# Patient Record
Sex: Male | Born: 1950
Health system: Southern US, Community
[De-identification: ages and names within clinical notes are randomized; demographics above are authoritative.]

## PROBLEM LIST (undated history)

## (undated) DIAGNOSIS — E291 Testicular hypofunction: Secondary | ICD-10-CM

## (undated) DIAGNOSIS — Z862 Personal history of diseases of the blood and blood-forming organs and certain disorders involving the immune mechanism: Secondary | ICD-10-CM

## (undated) DIAGNOSIS — N189 Chronic kidney disease, unspecified: Secondary | ICD-10-CM

## (undated) DIAGNOSIS — M5090 Cervical disc disorder, unspecified, unspecified cervical region: Secondary | ICD-10-CM

## (undated) DIAGNOSIS — Z87442 Personal history of urinary calculi: Secondary | ICD-10-CM

## (undated) DIAGNOSIS — K5730 Diverticulosis of large intestine without perforation or abscess without bleeding: Secondary | ICD-10-CM

## (undated) DIAGNOSIS — C801 Malignant (primary) neoplasm, unspecified: Secondary | ICD-10-CM

## (undated) DIAGNOSIS — G4733 Obstructive sleep apnea (adult) (pediatric): Secondary | ICD-10-CM

## (undated) DIAGNOSIS — E785 Hyperlipidemia, unspecified: Secondary | ICD-10-CM

## (undated) DIAGNOSIS — E119 Type 2 diabetes mellitus without complications: Secondary | ICD-10-CM

## (undated) DIAGNOSIS — K648 Other hemorrhoids: Secondary | ICD-10-CM

## (undated) DIAGNOSIS — G709 Myoneural disorder, unspecified: Secondary | ICD-10-CM

## (undated) DIAGNOSIS — E6609 Other obesity due to excess calories: Secondary | ICD-10-CM

## (undated) DIAGNOSIS — N529 Male erectile dysfunction, unspecified: Secondary | ICD-10-CM

## (undated) DIAGNOSIS — I10 Essential (primary) hypertension: Secondary | ICD-10-CM

## (undated) DIAGNOSIS — M7552 Bursitis of left shoulder: Secondary | ICD-10-CM

## (undated) DIAGNOSIS — M1990 Unspecified osteoarthritis, unspecified site: Secondary | ICD-10-CM

## (undated) HISTORY — DX: Diverticulosis of large intestine without perforation or abscess without bleeding: K57.30

## (undated) HISTORY — PX: COLONOSCOPY: SHX174

## (undated) HISTORY — DX: Male erectile dysfunction, unspecified: N52.9

## (undated) HISTORY — DX: Type 2 diabetes mellitus without complications: E11.9

## (undated) HISTORY — DX: Personal history of diseases of the blood and blood-forming organs and certain disorders involving the immune mechanism: Z86.2

## (undated) HISTORY — DX: Other hemorrhoids: K64.8

## (undated) HISTORY — DX: Hyperlipidemia, unspecified: E78.5

## (undated) HISTORY — DX: Cervical disc disorder, unspecified, unspecified cervical region: M50.90

## (undated) HISTORY — DX: Testicular hypofunction: E29.1

## (undated) HISTORY — DX: Other obesity due to excess calories: E66.09

## (undated) HISTORY — DX: Essential (primary) hypertension: I10

## (undated) HISTORY — DX: Obstructive sleep apnea (adult) (pediatric): G47.33

## (undated) HISTORY — DX: Bursitis of left shoulder: M75.52

---

## 1994-02-21 ENCOUNTER — Encounter: Payer: Self-pay | Admitting: Pulmonary Disease

## 1994-03-12 ENCOUNTER — Encounter: Payer: Self-pay | Admitting: Pulmonary Disease

## 1998-03-20 ENCOUNTER — Encounter: Admission: RE | Admit: 1998-03-20 | Discharge: 1998-06-18 | Payer: Self-pay | Admitting: *Deleted

## 1998-07-22 ENCOUNTER — Encounter: Admission: RE | Admit: 1998-07-22 | Discharge: 1998-10-20 | Payer: Self-pay | Admitting: *Deleted

## 2000-04-05 ENCOUNTER — Encounter: Payer: Self-pay | Admitting: Pulmonary Disease

## 2002-05-14 ENCOUNTER — Ambulatory Visit: Admission: RE | Admit: 2002-05-14 | Discharge: 2002-05-14 | Payer: Self-pay | Admitting: *Deleted

## 2004-04-22 ENCOUNTER — Ambulatory Visit: Payer: Self-pay | Admitting: Pulmonary Disease

## 2004-10-21 ENCOUNTER — Ambulatory Visit: Payer: Self-pay | Admitting: Family Medicine

## 2004-11-02 ENCOUNTER — Ambulatory Visit: Payer: Self-pay | Admitting: Family Medicine

## 2004-11-17 ENCOUNTER — Ambulatory Visit: Payer: Self-pay | Admitting: Family Medicine

## 2004-11-23 ENCOUNTER — Ambulatory Visit: Payer: Self-pay

## 2006-01-31 ENCOUNTER — Ambulatory Visit: Payer: Self-pay | Admitting: Family Medicine

## 2006-02-15 ENCOUNTER — Ambulatory Visit: Payer: Self-pay | Admitting: Family Medicine

## 2006-03-03 ENCOUNTER — Ambulatory Visit: Payer: Self-pay | Admitting: Family Medicine

## 2006-05-10 HISTORY — PX: ANTERIOR CERVICAL DECOMP/DISCECTOMY FUSION: SHX1161

## 2006-06-29 ENCOUNTER — Ambulatory Visit: Payer: Self-pay | Admitting: Family Medicine

## 2006-06-29 LAB — CONVERTED CEMR LAB
Hgb A1c MFr Bld: 8.4 % — ABNORMAL HIGH (ref 4.6–6.0)
Microalb Creat Ratio: 48.4 mg/g — ABNORMAL HIGH (ref 0.0–30.0)

## 2006-10-05 ENCOUNTER — Ambulatory Visit: Payer: Self-pay | Admitting: Family Medicine

## 2006-10-05 LAB — CONVERTED CEMR LAB
Creatinine,U: 168.3 mg/dL
Hgb A1c MFr Bld: 8.3 % — ABNORMAL HIGH (ref 4.6–6.0)
Microalb Creat Ratio: 92.7 mg/g — ABNORMAL HIGH (ref 0.0–30.0)

## 2006-10-07 ENCOUNTER — Encounter: Payer: Self-pay | Admitting: Family Medicine

## 2006-10-07 DIAGNOSIS — I10 Essential (primary) hypertension: Secondary | ICD-10-CM | POA: Insufficient documentation

## 2006-10-07 DIAGNOSIS — E785 Hyperlipidemia, unspecified: Secondary | ICD-10-CM | POA: Insufficient documentation

## 2006-10-19 ENCOUNTER — Ambulatory Visit: Payer: Self-pay | Admitting: Family Medicine

## 2006-12-20 ENCOUNTER — Encounter: Payer: Self-pay | Admitting: Family Medicine

## 2007-03-22 ENCOUNTER — Ambulatory Visit: Payer: Self-pay | Admitting: Family Medicine

## 2007-03-22 LAB — CONVERTED CEMR LAB
Bilirubin Urine: NEGATIVE
Glucose, Urine, Semiquant: 100
Specific Gravity, Urine: 1.02
WBC Urine, dipstick: NEGATIVE

## 2007-03-28 ENCOUNTER — Emergency Department (HOSPITAL_COMMUNITY): Admission: EM | Admit: 2007-03-28 | Discharge: 2007-03-28 | Payer: Self-pay | Admitting: Emergency Medicine

## 2007-03-29 ENCOUNTER — Telehealth: Payer: Self-pay | Admitting: Family Medicine

## 2007-03-29 LAB — CONVERTED CEMR LAB
ALT: 18 units/L (ref 0–53)
BUN: 30 mg/dL — ABNORMAL HIGH (ref 6–23)
Basophils Absolute: 0 10*3/uL (ref 0.0–0.1)
Basophils Relative: 0.5 % (ref 0.0–1.0)
CO2: 33 meq/L — ABNORMAL HIGH (ref 19–32)
Cholesterol: 161 mg/dL (ref 0–200)
GFR calc non Af Amer: 82 mL/min
HDL: 46 mg/dL (ref >39.0)
Hemoglobin: 13 g/dL (ref 13.0–17.0)
LDL Cholesterol: 101 mg/dL — ABNORMAL HIGH (ref 0–99)
Monocytes Absolute: 0.5 10*3/uL (ref 0.2–0.7)
Monocytes Relative: 6.8 % (ref 3.0–11.0)
Neutro Abs: 4.8 10*3/uL (ref 1.4–7.7)
RBC: 4.74 M/uL (ref 4.22–5.81)
Sodium: 148 meq/L — ABNORMAL HIGH (ref 135–145)
Testosterone: 269.02 ng/dL — ABNORMAL LOW (ref 350.00–890)
Total Protein: 6.9 g/dL (ref 6.0–8.3)
VLDL: 14 mg/dL (ref 0–40)
WBC: 7.1 10*3/uL (ref 4.5–10.5)

## 2007-03-30 ENCOUNTER — Emergency Department (HOSPITAL_COMMUNITY): Admission: EM | Admit: 2007-03-30 | Discharge: 2007-03-30 | Payer: Self-pay | Admitting: Emergency Medicine

## 2007-04-02 ENCOUNTER — Encounter: Admission: RE | Admit: 2007-04-02 | Discharge: 2007-04-02 | Payer: Self-pay | Admitting: Family Medicine

## 2007-04-03 ENCOUNTER — Telehealth: Payer: Self-pay | Admitting: Internal Medicine

## 2007-04-04 ENCOUNTER — Encounter: Payer: Self-pay | Admitting: Family Medicine

## 2007-04-13 ENCOUNTER — Encounter: Payer: Self-pay | Admitting: Family Medicine

## 2007-04-24 ENCOUNTER — Ambulatory Visit (HOSPITAL_COMMUNITY): Admission: RE | Admit: 2007-04-24 | Discharge: 2007-04-25 | Payer: Self-pay | Admitting: Neurosurgery

## 2007-05-11 HISTORY — PX: POSTERIOR LAMINECTOMY / DECOMPRESSION CERVICAL SPINE: SUR739

## 2007-05-19 ENCOUNTER — Encounter: Payer: Self-pay | Admitting: Family Medicine

## 2007-05-31 ENCOUNTER — Telehealth: Payer: Self-pay | Admitting: Pulmonary Disease

## 2007-06-02 ENCOUNTER — Encounter: Payer: Self-pay | Admitting: Pulmonary Disease

## 2007-06-06 ENCOUNTER — Telehealth: Payer: Self-pay | Admitting: Family Medicine

## 2007-06-09 ENCOUNTER — Encounter: Payer: Self-pay | Admitting: Family Medicine

## 2007-06-10 ENCOUNTER — Encounter: Payer: Self-pay | Admitting: Pulmonary Disease

## 2007-06-13 ENCOUNTER — Ambulatory Visit: Payer: Self-pay | Admitting: Pulmonary Disease

## 2007-06-14 ENCOUNTER — Encounter: Payer: Self-pay | Admitting: Family Medicine

## 2007-06-15 ENCOUNTER — Encounter: Admission: RE | Admit: 2007-06-15 | Discharge: 2007-06-15 | Payer: Self-pay | Admitting: Neurosurgery

## 2007-06-20 ENCOUNTER — Encounter: Payer: Self-pay | Admitting: Family Medicine

## 2007-07-10 ENCOUNTER — Encounter: Payer: Self-pay | Admitting: Family Medicine

## 2007-07-11 ENCOUNTER — Ambulatory Visit: Payer: Self-pay | Admitting: Family Medicine

## 2007-07-17 DIAGNOSIS — M5030 Other cervical disc degeneration, unspecified cervical region: Secondary | ICD-10-CM | POA: Insufficient documentation

## 2007-07-17 DIAGNOSIS — M67919 Unspecified disorder of synovium and tendon, unspecified shoulder: Secondary | ICD-10-CM | POA: Insufficient documentation

## 2007-07-17 DIAGNOSIS — M719 Bursopathy, unspecified: Secondary | ICD-10-CM

## 2007-07-18 ENCOUNTER — Encounter: Payer: Self-pay | Admitting: Pulmonary Disease

## 2007-07-19 ENCOUNTER — Telehealth: Payer: Self-pay | Admitting: Family Medicine

## 2007-09-13 ENCOUNTER — Encounter: Payer: Self-pay | Admitting: Family Medicine

## 2007-10-25 ENCOUNTER — Encounter: Payer: Self-pay | Admitting: Pulmonary Disease

## 2007-10-30 ENCOUNTER — Telehealth: Payer: Self-pay | Admitting: Pulmonary Disease

## 2007-12-11 ENCOUNTER — Encounter: Payer: Self-pay | Admitting: Family Medicine

## 2007-12-14 ENCOUNTER — Ambulatory Visit: Payer: Self-pay | Admitting: Family Medicine

## 2007-12-14 DIAGNOSIS — E119 Type 2 diabetes mellitus without complications: Secondary | ICD-10-CM | POA: Insufficient documentation

## 2007-12-15 LAB — CONVERTED CEMR LAB: Hgb A1c MFr Bld: 9.7 % — ABNORMAL HIGH (ref 4.6–6.0)

## 2008-02-08 HISTORY — PX: OTHER SURGICAL HISTORY: SHX169

## 2008-02-13 ENCOUNTER — Encounter: Payer: Self-pay | Admitting: Family Medicine

## 2008-02-18 ENCOUNTER — Encounter: Admission: RE | Admit: 2008-02-18 | Discharge: 2008-02-18 | Payer: Self-pay | Admitting: Neurosurgery

## 2008-02-21 ENCOUNTER — Encounter: Payer: Self-pay | Admitting: Family Medicine

## 2008-03-07 ENCOUNTER — Ambulatory Visit (HOSPITAL_COMMUNITY): Admission: RE | Admit: 2008-03-07 | Discharge: 2008-03-09 | Payer: Self-pay | Admitting: Neurosurgery

## 2008-03-27 ENCOUNTER — Encounter: Payer: Self-pay | Admitting: Family Medicine

## 2008-04-09 ENCOUNTER — Ambulatory Visit: Payer: Self-pay | Admitting: Family Medicine

## 2008-04-09 LAB — CONVERTED CEMR LAB
Bilirubin Urine: NEGATIVE
Nitrite: NEGATIVE
Specific Gravity, Urine: 1.02
Urobilinogen, UA: 1
pH: 7

## 2008-04-11 ENCOUNTER — Ambulatory Visit: Payer: Self-pay | Admitting: Family Medicine

## 2008-04-16 LAB — CONVERTED CEMR LAB
ALT: 19 units/L (ref 0–53)
Basophils Absolute: 0.1 10*3/uL (ref 0.0–0.1)
Basophils Relative: 0.8 % (ref 0.0–3.0)
Creatinine,U: 159.8 mg/dL
Eosinophils Relative: 5 % (ref 0.0–5.0)
HCT: 32.6 % — ABNORMAL LOW (ref 39.0–52.0)
HDL: 38.9 mg/dL — ABNORMAL LOW (ref >39.0)
Hgb A1c MFr Bld: 8.2 % — ABNORMAL HIGH (ref 4.6–6.0)
LDL Cholesterol: 64 mg/dL (ref 0–99)
Lymphocytes Relative: 26.9 % (ref 12.0–46.0)
MCV: 76.7 fL — ABNORMAL LOW (ref 78.0–100.0)
Monocytes Absolute: 0.5 10*3/uL (ref 0.1–1.0)
Monocytes Relative: 7.1 % (ref 3.0–12.0)
Platelets: 379 10*3/uL (ref 150–400)
RDW: 15 % — ABNORMAL HIGH (ref 11.5–14.6)
Sodium: 140 meq/L (ref 135–145)
TSH: 2.77 microintl units/mL (ref 0.35–5.50)
Total CHOL/HDL Ratio: 3.2
Total Protein: 7.6 g/dL (ref 6.0–8.3)
Triglycerides: 108 mg/dL (ref 0–149)
VLDL: 22 mg/dL (ref 0–40)

## 2008-04-26 ENCOUNTER — Encounter: Payer: Self-pay | Admitting: Family Medicine

## 2008-06-18 ENCOUNTER — Encounter: Payer: Self-pay | Admitting: Family Medicine

## 2008-07-08 ENCOUNTER — Telehealth: Payer: Self-pay | Admitting: Family Medicine

## 2008-08-16 ENCOUNTER — Encounter: Payer: Self-pay | Admitting: Family Medicine

## 2008-09-18 ENCOUNTER — Encounter: Payer: Self-pay | Admitting: Family Medicine

## 2008-11-27 ENCOUNTER — Ambulatory Visit: Payer: Self-pay | Admitting: Family Medicine

## 2008-11-27 DIAGNOSIS — N529 Male erectile dysfunction, unspecified: Secondary | ICD-10-CM | POA: Insufficient documentation

## 2008-11-27 DIAGNOSIS — E291 Testicular hypofunction: Secondary | ICD-10-CM

## 2008-11-27 LAB — CONVERTED CEMR LAB: Hemoglobin: 8.4 g/dL

## 2008-11-28 ENCOUNTER — Ambulatory Visit: Payer: Self-pay | Admitting: Family Medicine

## 2008-11-28 LAB — CONVERTED CEMR LAB
TSH: 1.45 microintl units/mL (ref 0.35–5.50)
Testosterone: 188.5 ng/dL — ABNORMAL LOW (ref 350.00–890.00)

## 2008-12-25 ENCOUNTER — Encounter: Payer: Self-pay | Admitting: Family Medicine

## 2009-03-13 ENCOUNTER — Telehealth: Payer: Self-pay | Admitting: Family Medicine

## 2009-04-08 ENCOUNTER — Ambulatory Visit: Payer: Self-pay | Admitting: Family Medicine

## 2009-04-16 ENCOUNTER — Ambulatory Visit: Payer: Self-pay | Admitting: Family Medicine

## 2009-04-16 LAB — CONVERTED CEMR LAB
Alkaline Phosphatase: 66 units/L (ref 39–117)
Basophils Absolute: 0.1 10*3/uL (ref 0.0–0.1)
Chloride: 105 meq/L (ref 96–112)
Creatinine, Ser: 0.8 mg/dL (ref 0.4–1.5)
Eosinophils Relative: 4.9 % (ref 0.0–5.0)
Glucose, Bld: 162 mg/dL — ABNORMAL HIGH (ref 70–99)
Ketones, ur: NEGATIVE mg/dL
Lymphocytes Relative: 27.6 % (ref 12.0–46.0)
Neutro Abs: 3.3 10*3/uL (ref 1.4–7.7)
Neutrophils Relative %: 59.8 % (ref 43.0–77.0)
Nitrite: NEGATIVE
Platelets: 327 10*3/uL (ref 150.0–400.0)
RBC: 4.64 M/uL (ref 4.22–5.81)
RDW: 16 % — ABNORMAL HIGH (ref 11.5–14.6)
Specific Gravity, Urine: 1.02 (ref 1.000–1.030)
TSH: 2.71 microintl units/mL (ref 0.35–5.50)
Total CHOL/HDL Ratio: 3
Triglycerides: 76 mg/dL (ref 0.0–149.0)
Urine Glucose: 100 mg/dL
Urobilinogen, UA: 0.2 (ref 0.0–1.0)
VLDL: 15.2 mg/dL (ref 0.0–40.0)

## 2009-04-17 LAB — CONVERTED CEMR LAB
Hgb A1c MFr Bld: 8.5 % — ABNORMAL HIGH (ref 4.6–6.5)
Testosterone: 230.84 ng/dL — ABNORMAL LOW (ref 350.00–890.00)

## 2009-04-28 ENCOUNTER — Telehealth: Payer: Self-pay | Admitting: Family Medicine

## 2009-05-07 ENCOUNTER — Telehealth: Payer: Self-pay | Admitting: Family Medicine

## 2009-05-13 ENCOUNTER — Telehealth: Payer: Self-pay | Admitting: Family Medicine

## 2009-05-20 ENCOUNTER — Telehealth: Payer: Self-pay | Admitting: Family Medicine

## 2009-05-28 ENCOUNTER — Telehealth: Payer: Self-pay | Admitting: Family Medicine

## 2009-06-04 ENCOUNTER — Telehealth: Payer: Self-pay | Admitting: Internal Medicine

## 2009-06-16 ENCOUNTER — Telehealth: Payer: Self-pay | Admitting: Family Medicine

## 2009-06-19 ENCOUNTER — Telehealth: Payer: Self-pay | Admitting: Family Medicine

## 2009-09-29 ENCOUNTER — Telehealth: Payer: Self-pay | Admitting: *Deleted

## 2010-01-22 ENCOUNTER — Ambulatory Visit: Payer: Self-pay | Admitting: Pulmonary Disease

## 2010-01-22 DIAGNOSIS — G4733 Obstructive sleep apnea (adult) (pediatric): Secondary | ICD-10-CM | POA: Insufficient documentation

## 2010-01-28 ENCOUNTER — Telehealth (INDEPENDENT_AMBULATORY_CARE_PROVIDER_SITE_OTHER): Payer: Self-pay | Admitting: *Deleted

## 2010-02-03 ENCOUNTER — Ambulatory Visit: Payer: Self-pay | Admitting: Pulmonary Disease

## 2010-02-15 DIAGNOSIS — D649 Anemia, unspecified: Secondary | ICD-10-CM | POA: Insufficient documentation

## 2010-02-15 DIAGNOSIS — K5730 Diverticulosis of large intestine without perforation or abscess without bleeding: Secondary | ICD-10-CM | POA: Insufficient documentation

## 2010-03-02 ENCOUNTER — Encounter: Payer: Self-pay | Admitting: Pulmonary Disease

## 2010-03-04 ENCOUNTER — Encounter: Payer: Self-pay | Admitting: Pulmonary Disease

## 2010-03-07 ENCOUNTER — Encounter: Payer: Self-pay | Admitting: Pulmonary Disease

## 2010-04-06 ENCOUNTER — Ambulatory Visit: Payer: Self-pay | Admitting: Pulmonary Disease

## 2010-04-09 ENCOUNTER — Telehealth: Payer: Self-pay | Admitting: Family Medicine

## 2010-04-15 ENCOUNTER — Ambulatory Visit: Payer: Self-pay | Admitting: Pulmonary Disease

## 2010-04-15 ENCOUNTER — Encounter: Payer: Self-pay | Admitting: Pulmonary Disease

## 2010-04-16 LAB — CONVERTED CEMR LAB
ALT: 17 units/L (ref 0–53)
Basophils Relative: 0.5 % (ref 0.0–3.0)
Bilirubin, Direct: 0.1 mg/dL (ref 0.0–0.3)
Cholesterol: 148 mg/dL (ref 0–200)
Eosinophils Absolute: 0.3 10*3/uL (ref 0.0–0.7)
HCT: 37.8 % — ABNORMAL LOW (ref 39.0–52.0)
Hemoglobin, Urine: NEGATIVE
Hgb A1c MFr Bld: 6.8 % — ABNORMAL HIGH (ref 4.6–6.5)
LDL Cholesterol: 83 mg/dL (ref 0–99)
Leukocytes, UA: NEGATIVE
MCV: 80.3 fL (ref 78.0–100.0)
Monocytes Relative: 6.7 % (ref 3.0–12.0)
Neutro Abs: 3.8 10*3/uL (ref 1.4–7.7)
PSA: 0.56 ng/mL (ref 0.10–4.00)
Platelets: 309 10*3/uL (ref 150.0–400.0)
Potassium: 4 meq/L (ref 3.5–5.1)
Sodium: 141 meq/L (ref 135–145)
Specific Gravity, Urine: >=1.03 (ref 1.000–1.030)
Testosterone: 326.7 ng/dL — ABNORMAL LOW (ref 350.00–890.00)
Total CHOL/HDL Ratio: 3
Total Protein, Urine: 100 mg/dL
Total Protein: 6.9 g/dL (ref 6.0–8.3)
Triglycerides: 91 mg/dL (ref 0.0–149.0)
VLDL: 18.2 mg/dL (ref 0.0–40.0)
WBC: 5.8 10*3/uL (ref 4.5–10.5)

## 2010-05-31 ENCOUNTER — Encounter: Payer: Self-pay | Admitting: Neurosurgery

## 2010-05-31 ENCOUNTER — Encounter: Payer: Self-pay | Admitting: Family Medicine

## 2010-06-11 NOTE — Assessment & Plan Note (Signed)
Summary: rov for osa   Visit Type:  Follow-up  CC:  follow up OSA. last seen 06/2007. 7/7 nights x6-8 hours a night. Pt states his maching is giving him some problems and is making noises. Pt states he think his pressures need to be rechecked bc he is having a little more problems than before.  History of Present Illness: the pt comes in today for f/u of his osa.  He is wearing cpap compliantly, but is having problems with his machine.  It is making noises, and the pt feels that it is not putting out adequate pressure?  He has noticed that he is not sleeping quite as well, and has seen deterioration in his perceived daytime alertness with periods of inactivity.  His weight is up only 3 pounds since the last visit, but I had asked him to work aggressively on weight loss.  Current Medications (verified): 1)  Adult Aspirin Low Strength 81 Mg Chew (Aspirin) .... Take 1 Tablet By Mouth Once A Day 2)  Glimepiride 4 Mg Tabs (Glimepiride) .... Take 1 Tablet By Mouth Twice A Day 3)  Simvastatin 40 Mg Tabs (Simvastatin) .... Take 1 Tablet By Mouth At Bedtime 4)  Lisinopril 40 Mg  Tabs (Lisinopril) .... Once Daily 5)  Furosemide 40 Mg  Tabs (Furosemide) .... Once Daily 6)  Amlodipine Besylate 10 Mg  Tabs (Amlodipine Besylate) .... Once Daily 7)  Lantus 100 Unit/ml  Soln (Insulin Glargine) .... As Directed 70 Units Once Daily 8)  Bd Insulin Syringe 29g X 1/2" 0.5 Ml Misc (Insulin Syringe-Needle U-100) .... Use As Directed 9)  Accu-Chek Comfort Curve  Soln (Blood Glucose Calibration) 10)  Freestyle Lancets  Misc (Lancets) .... To Test Daily 11)  Freestyle Lite Test  Strp (Glucose Blood) .... To Test Daily 12)  Testim 1 % Gel (Testosterone) .... Apply Daily 13)  Viagra 100 Mg Tabs (Sildenafil Citrate) .Marland Kitchen.. 1 As Instructed 14)  Actoplus Met Xr 15-1000 Mg Xr24h-Tab (Pioglitazone Hcl-Metformin Hcl) .Marland Kitchen.. 1 Bid 15)  Androgel Pump 1 % Gel (Testosterone) .... 2 Pumps Per Day, 5.0 Gms Qd 16)  Fexofenadine Hcl 180  Mg Tabs (Fexofenadine Hcl) .Marland Kitchen.. 1 Once Daily For Allergy 17)  Relion Pen Needles 31g X 8 Mm Misc (Insulin Pen Needle) .... Use As Directed Daily  Allergies (verified): 1)  ! Penicillin 2)  Amoxicillin (Amoxicillin) 3)  Levaquin (Levofloxacin)  Past History:  Past medical, surgical, family and social histories (including risk factors) reviewed, and no changes noted (except as noted below).  Past Medical History: Reviewed history from 04/11/2008 and no changes required. PHYSICAL EXAMINATION (ICD-V70.0) KIDNEY DISEASE, CHRONIC, STAGE II (ICD-585.2) HYPERTENSION (ICD-401.9) HYPERLIPIDEMIA (ICD-272.4) DIABETES MELLITUS, TYPE I (ICD-250.01) Sleep Apnea      EKG  hospital 2009 back surgery  PNEUMONIA VACCINE  2004   DT 9adacel 0 2009 Colon  pt states has had but will ck on date  SMOKER  never   Past Surgical History: Reviewed history from 04/16/2009 and no changes required. cervical laminectomy  Family History: Reviewed history from 06/13/2007 and no changes required. Father-heart disease Mother-breast CA  Social History: Reviewed history from 06/13/2007 and no changes required. Patient never smoked.  ETOH use-wine Pt is married with children. 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  Review of Systems       The patient complains of shortness of breath with activity, non-productive cough, indigestion, nasal congestion/difficulty breathing through nose, and hand/feet swelling.  The patient denies shortness of breath at  rest, productive cough, coughing up blood, chest pain, irregular heartbeats, acid heartburn, loss of appetite, weight change, abdominal pain, difficulty swallowing, sore throat, tooth/dental problems, headaches, sneezing, itching, ear ache, anxiety, depression, joint stiffness or pain, rash, change in color of mucus, and fever.    Vital Signs:  Patient profile:   60 year old male Height:      68 inches Weight:      288.25 pounds BMI:      43.99 O2 Sat:      97 % on Room air Temp:     98.5 degrees F oral Pulse rate:   78 / minute BP sitting:   138 / 72  (left arm) Cuff size:   large  Vitals Entered By: Carver Fila (January 22, 2010 10:17 AM)  O2 Flow:  Room air CC: follow up OSA. last seen 06/2007. 7/7 nights x6-8 hours a night. Pt states his maching is giving him some problems and is making noises. Pt states he think his pressures need to be rechecked bc he is having a little more problems than before Comments meds and allergies updated Phone number updated Carver Fila  January 22, 2010 10:19 AM    Physical Exam  General:  obese male in nad Nose:  no skin breakdown or pressure necrosis from cpap mask Extremities:  mild edema, no cyanosis  Neurologic:  alert, does not appear sleepy, moves all 4.   Impression & Recommendations:  Problem # 1:  OBSTRUCTIVE SLEEP APNEA (ICD-327.23) the pt is having issues with his machine that appears to be affecting his sleep.  Will work on getting him a new machine, and will also use auto mode to re-optimize his pressure.  I have asked the pt to work hard on losing weight.  Will let him know his optimal pressure, and he is to call me if he continues to have sleep issues.  He will followup formally with me in one year, or sooner if having problems.  Other Orders: DME Referral (DME) Est. Patient Level III ([address])  Patient Instructions: 1)  will get you a new machine and re-optimize your pressure.  will let you know the results. 2)  work on weight loss 3)  followup with me in one year.   Immunization History:  Influenza Immunization History:    Influenza:  historical (02/07/2009)   Appended Document: rov for osa we have received pt's download off machine and put into your very important look at folder

## 2010-06-11 NOTE — Letter (Signed)
Summary: Dr Marlaine Hind  optometrist  Dr Marlaine Hind  optometrist   Imported By: Lester Chisholm 04/13/2010 09:11:19  _____________________________________________________________________  External Attachment:    Type:   Image     Comment:   External Document

## 2010-06-11 NOTE — Progress Notes (Signed)
Summary: refill glimepiride   Phone Note From Pharmacy   [facility]* Reason for Call: Needs renewal Summary of Call: refill glimepiride 4 mg  Initial call taken by: Pura Spice, RN,  May 28, 2009 3:45 PM  Follow-up for Phone Call        ok per dr Scotty Court with 6 refills  Follow-up by: Pura Spice, RN,  May 28, 2009 3:45 PM    Prescriptions: GLIMEPIRIDE 4 MG TABS (GLIMEPIRIDE) Take 1 tablet by mouth twice a day  #180 x 1   Entered by:   Pura Spice, RN   Authorized by:   Judithann Sheen MD   Signed by:   Pura Spice, RN on 05/28/2009   Method used:   Electronically to        [facility]* (retail)       [address] [place], [place]  [address]       Ph: [phone]       Fax: [phone]   RxID:   [id number]

## 2010-06-11 NOTE — Progress Notes (Signed)
  Phone Note From Pharmacy   Caller: betsy w/ [facility] Call For: clance  Summary of Call: needs copy of sleep study since they have never set up a cpap for pt before. fax # [phone]. contact # betsy [phone] Initial call taken by: [name], CNA,  January 28, 2010 4:05 PM  Follow-up for Phone Call        Spoke with rep at [facility] that a baseline sleep study not on file for pt-will need this to complete order. Called Sleep lab to see if study is available to send to them. Waiting for Sleep Study.[name] CMA  January 28, 2010 4:19 PM    No sleep study on file at sleep center per [name]-will pull paper chart to see if in there.[name] CMA  January 28, 2010 4:21 PM   Additional Follow-up for Phone Call Additional follow up Details #1::        received chart from medical records and found old sleep study from 1995.  faxed sleep study to [facility] with [facility].  [name] LPN  January 29, 2010 9:40 AM

## 2010-06-11 NOTE — Progress Notes (Signed)
Summary: written prescriptions  Phone Note Call from Patient   Caller: Patient Call For: Judithann Sheen MD Summary of Call: Pt needs a written prescription for a 90 day supply of Lantus and needles.  (Pins) Pt will pick up prescriptions.  Pharmacy has denied........it due to quanitiy. [phone] Initial call taken by: Lynann Beaver CMA,  June 16, 2009 12:34 PM  Follow-up for Phone Call        see Rx, ready for pick up.  Patient notified.  Follow-up by: Gladis Riffle, RN,  June 17, 2009 8:50 AM    New/Updated Medications: RELION PEN NEEDLES 31G X 8 MM MISC (INSULIN PEN NEEDLE) use as directed daily Prescriptions: LANTUS 100 UNIT/ML  SOLN (INSULIN GLARGINE) as directed 70 units once daily  #90 day x 3   Entered by:   Gladis Riffle, RN   Authorized by:   Judithann Sheen MD   Signed by:   Gladis Riffle, RN on 06/17/2009   Method used:   Print then Give to Patient   RxID:   [id number] RELION PEN NEEDLES 31G X 8 MM MISC (INSULIN PEN NEEDLE) use as directed daily  #90 day x 3   Entered by:   Gladis Riffle, RN   Authorized by:   Judithann Sheen MD   Signed by:   Gladis Riffle, RN on 06/17/2009   Method used:   Print then Give to Patient   RxID:   [id number] LANTUS 100 UNIT/ML  SOLN (INSULIN GLARGINE) as directed 70 units once daily  #90 day x 3   Entered by:   Gladis Riffle, RN   Authorized by:   Judithann Sheen MD   Signed by:   Gladis Riffle, RN on 06/17/2009   Method used:   Electronically to        [facility]* (retail)       [address] [place], [place]  [address]       Ph: [phone]       Fax: [phone]   RxID:   [id number]  redone for pt to pick up as required.  Called pharmacy to disregard electronic Rx.  Appended Document: written prescriptions Pharmacy called back--there was no problem with original Rx; pt will have to pick up every two weeks due to insurance limits.  They will  re-explain to pt.

## 2010-06-11 NOTE — Progress Notes (Signed)
Summary: refill on glimepiride  Phone Note From Pharmacy   [facility]er: [facility]* Reason for Call: Needs renewal Details for Reason: GLIMEPIRIDE 4MG  Initial call taken by: Romualdo Bolk, CMA Duncan Dull),  Sep 29, 2009 3:55 PM  Follow-up for Phone Call        Rx sent to pharmacy. Follow-up by: Romualdo Bolk, CMA Duncan Dull),  Sep 29, 2009 3:58 PM    Prescriptions: GLIMEPIRIDE 4 MG TABS (GLIMEPIRIDE) Take 1 tablet by mouth twice a day  #180 x 1   Entered by:   Romualdo Bolk, CMA (AAMA)   Authorized by:   Judithann Sheen MD   Signed by:   Romualdo Bolk, CMA (AAMA) on 09/29/2009   Method used:   Electronically to        [facility]* (retail)       [address] [place], [place]  [address]       Ph: [phone]       Fax: [phone]   RxID:   [id number]

## 2010-06-11 NOTE — Miscellaneous (Signed)
Summary: optimal pressure 12cm   Clinical Lists Changes  Orders: Added new Referral order of DME Referral (DME) - Signed auto download shows great compliance, no significant leak, optimal pressure of 12cm

## 2010-06-11 NOTE — Progress Notes (Signed)
Summary: victoza insurance needs prior auth.   Phone Note Call from Patient   Caller: Patient Summary of Call: stated needs victoza  Initial call taken by: Pura Spice, RN,  May 20, 2009 11:15 AM  Follow-up for Phone Call        victoza insulin clled in last week. called [facility] and spoke to [place]  and Renae Fickle stated victoza not covered by insurance and his insurace prefers byetta.   Requested for Renae Fickle to send over prior  auth  Follow-up by: Pura Spice, RN,  May 20, 2009 11:17 AM  Additional Follow-up for Phone Call Additional follow up Details #1::        called left mess for pt that this is where we stand. Additional Follow-up by: Pura Spice, RN,  May 20, 2009 11:18 AM

## 2010-06-11 NOTE — Progress Notes (Signed)
Summary: order for UA  Phone Note Call from Patient   Caller: Patient Call For: Judithann Sheen MD Summary of Call: Pt's son is concerned about his father's mental states.....Marland Kitchenseems depressed and no interest in anything.  His wife passed away in 2023/03/30, but they would like to check his UA in case it is an UTI.  Cannot bring him for OV this week. [phone]   Elmer Picker needs an order for an URINE  [phone] Griffin Dakin is the nurse Initial call taken by: Lynann Beaver CMA,  June 04, 2009 4:08 PM  Follow-up for Phone Call        OK to check u/a  Order faxed. Lynann Beaver [facility]  June 05, 2009 8:12 AM  Follow-up by: Gordy Savers  MD,  June 05, 2009 8:02 AM

## 2010-06-11 NOTE — Assessment & Plan Note (Signed)
Summary: est as new pt/la   CC:  New patient to establish... hx OSA, HBP, DM, and Obesity....  History of Present Illness: 60 y/o WM to establish for management of mult medical problems including OSA, HBP, DM w/ neuropathy, Obesity, & other problems listed below...    Current Problems:   OBSTRUCTIVE SLEEP APNEA (ICD-327.23) - followed by DrClance & wears CPAP compliantly...   ~  9/11:  f/u OV w/ DrClance and he ordered new CPAP machine for [name] [name]...  HYPERTENSION (ICD-401.9) - on AMLODIPINE 10mg /d,  LISINOPRIL 40mg /d, & FUROSEMIDE 40mg /d... BP= 140/62 & similar at home... denies HA, visual changes, CP, palipit, dizziness, syncope, dyspnea, edema, etc... he knows to elim sodium from diet & lose weight to control BP better...  HYPERLIPIDEMIA (ICD-272.4) - on SIMVASTATIN 40mg Qhs...  ~  FLP 12/09 showed TChol 124, TG 108, HDL 39, LDL 64  ~  FLP 11/10 showed TChol 145, TG 76, HDL 43, LDL 86  DIABETES MELLITUS, TYPE II (ICD-250.00) - on LANTUS 70u daily,  ACTOS+MET 1000-15 Bid, & GLIMEPIRIDE 4mg Bid...   ~  his Urine microalb has been +pos, w/ norm BUN & Creat...  ~  labs 12/09 showed BS= 86, A1c= 8.2  ~  labs 11/10 showed BS= 162, A1c= 8.5  EXOGENOUS OBESITY (ICD-278.00) - his weight has been consistantly in the 270-290# range x yrs... we discussed low carb/ low fat diet & exercise program needed to result in substantial weight loss...  ~  weight 12/09 = 279#,  68" tall,  BMI= 42.5  ~  weight 11/10 = 276#, 68" tall,  BMI= 42  ~  weight 9/11 = 290#,  68" tall,  BMI= 44... we discussed Bariatric surgery.  DIVERTICULOSIS OF COLON (ICD-562.10) - CT scan of abd 1/04 showed diverticulosis... he is overdue for referral to GI for colonoscopy...  TESTICULAR HYPOFUNCTION (ICD-257.2) - he has prev been on Androgel & Testim, but currently not on any either medication due to insurance coverage problems...  ~  labs 7/10 showed Testosterone level = 189 (350-890)  ~  labs 12/10 showed Testosterone  level = 231  ERECTILE DYSFUNCTION, ORGANIC (ICD-607.84) - uses VIAGRA 100mg  Prn...  DISC DISEASE, CERVICAL (ICD-722.4) - he is s/p 2 separate cervical operations from DrHirsh in 2008 & 2009  BURSITIS, LEFT SHOULDER (ICD-726.10) - he uses OTC anti-inflamm medications Prn...  Hx of ANEMIA (ICD-285.9) - he takes MVI daily...  ~  labs 12/09 (after [facility] surg) showed Hg= 10.9, MCV= 77  ~  labs 11/10 showed Hg= 12.3, MCV= 79   Preventive Screening-Counseling & Management  Alcohol-Tobacco     Smoking Status: never  Caffeine-Diet-Exercise     Caffeine use/day: 2 cups qd      Does Patient Exercise: yes     Type of exercise: walks      Exercise (avg: min/session): 30-60     Times/week: 4  Comments: pt stated that he tried smoking as a teenager but this didnt last  Allergies: 1)  ! Penicillin 2)  Amoxicillin (Amoxicillin) 3)  Levaquin (Levofloxacin)  Comments:  Nurse/Medical Assistant: The patient's medications and allergies were reviewed with the patient and were updated in the Medication and Allergy Lists.  Past History:  Past Medical History: OBSTRUCTIVE SLEEP APNEA (ICD-327.23) HYPERTENSION (ICD-401.9) HYPERLIPIDEMIA (ICD-272.4) DIABETES MELLITUS, TYPE II (ICD-250.00) EXOGENOUS OBESITY (ICD-278.00) DIVERTICULOSIS OF COLON (ICD-562.10) TESTICULAR HYPOFUNCTION (ICD-257.2) ERECTILE DYSFUNCTION, ORGANIC (ICD-607.84) DISC DISEASE, CERVICAL (ICD-722.4) BURSITIS, LEFT SHOULDER (ICD-726.10) Hx of ANEMIA (ICD-285.9)  Past Surgical History: S/P cervical decompression & C5-6  fusion 12/08 by DrHirsh S/P cervical disc surg & fusion 10/09 by DrHirsh  Family History: Reviewed history from 06/13/2007 and no changes required. Father-heart disease Mother-breast CA  Social History: Reviewed history from 06/13/2007 and no changes required. Patient never smoked.  ETOH use-wine Pt is married with children. 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  Review of  Systems      See HPI       The patient complains of dyspnea on exertion.  The patient denies anorexia, fever, weight loss, weight gain, vision loss, decreased hearing, hoarseness, chest pain, syncope, peripheral edema, prolonged cough, headaches, hemoptysis, abdominal pain, melena, hematochezia, severe indigestion/heartburn, hematuria, incontinence, muscle weakness, suspicious skin lesions, transient blindness, difficulty walking, depression, unusual weight change, abnormal bleeding, enlarged lymph nodes, and angioedema.    Vital Signs:  Patient profile:   60 year old male Height:      68 inches Weight:      290.13 pounds O2 Sat:      97 % on Room air Temp:     98.2 degrees F oral Pulse rate:   92 / minute BP sitting:   142 / 62  (left arm) Cuff size:   large  Vitals Entered By: Randell Loop CMA (February 03, 2010 2:39 PM)  O2 Sat at Rest %:  97 O2 Flow:  Room air CC: New patient to establish... hx OSA, HBP, DM, Obesity... Is Patient Diabetic? Yes Pain Assessment Patient in pain? no      Comments meds updated today with pt   Physical Exam  Additional Exam:  WD, Obese, 60 y/o WM in NAD... GENERAL:  Alert & oriented; pleasant & cooperative... HEENT:  [place]/AT, EOM-wnl, PERRLA, EACs-clear, TMs-wnl, NOSE-clear, THROAT-clear & wnl. NECK:  Supple w/ fairROM; no JVD; normal carotid impulses w/o bruits; no thyromegaly or nodules palpated; no lymphadenopathy. Scar of his ant cerv disc surg... CHEST:  Clear to P & A; without wheezes/ rales/ or rhonchi. HEART:  Regular Rhythm; without murmurs/ rubs/ or gallops. ABDOMEN:  Obese, soft & nontender; normal bowel sounds; no organomegaly or masses detected. EXT: without deformities, mild arthritic changes; no varicose veins/ +venous insuffic/ tr edema. NEURO:  CN's intact; motor testing normal; sensory testing normal; gait normal & balance OK. DERM:  No lesions noted; no rash etc...    Impression & Recommendations:  Problem # 1:   OBSTRUCTIVE SLEEP APNEA (ICD-327.23) Followed by DrClance on CPAP...  Problem # 2:  HYPERTENSION (ICD-401.9) Controlled on meds>  needs weight reduction to keep from having to incr meds in the future... His updated medication list for this problem includes:    Amlodipine Besylate 10 Mg Tabs (Amlodipine besylate) ..... Once daily    Lisinopril 40 Mg Tabs (Lisinopril) ..... Once daily    Furosemide 40 Mg Tabs (Furosemide) ..... Once daily  Problem # 3:  HYPERLIPIDEMIA (ICD-272.4) Stable on the Simva40... needs better low chol/ low fat diet... His updated medication list for this problem includes:    Simvastatin 40 Mg Tabs (Simvastatin) .Marland Kitchen... Take 1 tablet by mouth at bedtime  Problem # 4:  DIABETES MELLITUS, TYPE II (ICD-250.00) His control is poor and already on 70 u Lantus... we discussed adding ONGLYZA to his regimen but I would favor Bariatric surg as an aggressive treatment for his DM... given the # for CCS to begin this process... His updated medication list for this problem includes:    Adult Aspirin Low Strength 81 Mg Chew (Aspirin) .Marland Kitchen... Take 1 tablet by mouth  once a day    Lisinopril 40 Mg Tabs (Lisinopril) ..... Once daily    Lantus 100 Unit/ml Soln (Insulin glargine) .Marland Kitchen... As directed 70 units once daily    Actoplus Met Xr 15-1000 Mg Xr24h-tab (Pioglitazone hcl-metformin hcl) .Marland Kitchen... Take 1 tab by mouth two times a day...    Glimepiride 4 Mg Tabs (Glimepiride) .Marland Kitchen... Take 1 tablet by mouth twice a day    Onglyza 5 Mg Tabs (Saxagliptin hcl) .Marland Kitchen... Take 1 tab by mouth once daily...  Problem # 5:  EXOGENOUS OBESITY (ICD-278.00) As above>  he is unlikely to lose substantial weight w/ diet/ exercise based on past performance...  REC> consider Bariatric surg...   Problem # 6:  DIVERTICULOSIS OF COLON (ICD-562.10) HJe will need colonoscopy when able to sched...  Problem # 7:  GU>>> He has Low-T and ED... rec to petition his insurance company re: coverage for Testosterone replacement  products vs shot from Urology...  Problem # 8:  DISC DISEASE, CERVICAL (ICD-722.4) Followed by [facility] Collet for Neurosurg...  Complete Medication List: 1)  Fexofenadine Hcl 180 Mg Tabs (Fexofenadine hcl) .Marland Kitchen.. 1 once daily for allergy 2)  Adult Aspirin Low Strength 81 Mg Chew (Aspirin) .... Take 1 tablet by mouth once a day 3)  Amlodipine Besylate 10 Mg Tabs (Amlodipine besylate) .... Once daily 4)  Lisinopril 40 Mg Tabs (Lisinopril) .... Once daily 5)  Furosemide 40 Mg Tabs (Furosemide) .... Once daily 6)  Simvastatin 40 Mg Tabs (Simvastatin) .... Take 1 tablet by mouth at bedtime 7)  Lantus 100 Unit/ml Soln (Insulin glargine) .... As directed 70 units once daily 8)  Actoplus Met Xr 15-1000 Mg Xr24h-tab (Pioglitazone hcl-metformin hcl) .... Take 1 tab by mouth two times a day... 9)  Glimepiride 4 Mg Tabs (Glimepiride) .... Take 1 tablet by mouth twice a day 10)  Onglyza 5 Mg Tabs (Saxagliptin hcl) .... Take 1 tab by mouth once daily... 11)  Testim 1 % Gel (Testosterone) .... Apply daily 12)  Androgel Pump 1 % Gel (Testosterone) .... 2 pumps per day, 5.0 gms qd 13)  Viagra 100 Mg Tabs (Sildenafil citrate) .Marland Kitchen.. 1 as instructed 14)  Aleve 220 Mg Tabs (Naproxen sodium) .... As needed for pain 15)  Bd Insulin Syringe 29g X 1/2" 0.5 Ml Misc (Insulin syringe-needle u-100) .... Use as directed 16)  Relion Pen Needles 31g X 8 Mm Misc (Insulin pen needle) .... Use as directed daily 17)  Freestyle Lancets Misc (Lancets) .... To test daily 18)  Freestyle Lite Test Strp (Glucose blood) .... To test daily 19)  Accu-chek Comfort Curve Soln (Blood glucose calibration)  Other Orders: Urology Referral (Urology) Admin 1st Vaccine ([address]) Flu Vaccine 23yrs + [phone])  Patient Instructions: 1)  [name] [name], it was nice meeting you today... 2)  Today we updated your med list- see below.... We decided to continue your current Diabetic meds and add ONGLYZA 5mg  1 tab daily at dinner... 3)  We discussed the imperative of  Diet + exercise, with the goal being steady weight reduction.Marland KitchenMarland Kitchen 4)  You should consider Bariatric Surgery for your DM- call [facility] at [phone] for their general informative meeting in this regard... 5)  If you want to talk w/ a dietician we can refer to the [facility] (let us know) 6)  We will set up an appt w/ the Urologist regarding your "low-T" & to consider the shots for this problem,.. 7)  We gave you the 2011 Flu vaccine today... 8)  Let's plan  a Physical exam in December & call us the week before for your FASTING blood work... Prescriptions: ONGLYZA 5 MG TABS (SAXAGLIPTIN HCL) take 1 tab by mouth once daily...  #30 x 6   Entered by:   Randell Loop CMA   Authorized by:   Michele Mcalpine MD   Signed by:   Randell Loop CMA on 02/03/2010   Method used:   Electronically to        [facility]* (retail)       [name] [place], [place]  [address]       Ph: [phone]       Fax: [phone]   RxID:   [id number]      Flu Vaccine Consent Questions     Do you have a history of severe allergic reactions to this vaccine? no    Any prior history of allergic reactions to egg and/or gelatin? no    Do you have a sensitivity to the preservative Thimersol? no    Do you have a past history of Guillan-Barre Syndrome? no    Do you currently have an acute febrile illness? no    Have you ever had a severe reaction to latex? no    Vaccine information given and explained to patient? yes    Are you currently pregnant? no    Lot Number:[id number]   Exp Date:11/07/2010   Site Given  Left Deltoid IMflu   Randell Loop [facility]  February 03, 2010 4:47 PM

## 2010-06-11 NOTE — Progress Notes (Signed)
Summary: refill  Phone Note Refill Request Message from:  Fax from Pharmacy on April 09, 2010 3:49 PM  Refills Requested: Medication #1:  LISINOPRIL 40 MG  TABS once daily Initial call taken by: Kern Reap CMA Duncan Dull),  April 09, 2010 3:49 PM    Prescriptions: LISINOPRIL 40 MG  TABS (LISINOPRIL) once daily  #30 x 3   Entered by:   Kern Reap CMA (AAMA)   Authorized by:   Judithann Sheen MD   Signed by:   Kern Reap CMA (AAMA) on 04/09/2010   Method used:   Electronically to        [facility]* (retail)       [address] [place], [place]  [address]       Ph: [phone]       Fax: [phone]   RxID:   [id number]

## 2010-06-11 NOTE — Progress Notes (Signed)
Summary: refill request  victoza   Phone Note Call from Patient   Caller: Patient Call For: Judithann Sheen MD Summary of Call: Pt states that Dr. Scotty Court called him from home, and prescribed a new insulin, and he does not know the name, but he needs refills sent to [facility]) [phone] Initial call taken by: Lynann Beaver CMA,  May 13, 2009 3:25 PM  Follow-up for Phone Call        pt is on victoza and states BS running 129-139 and has lost 8 lbs. wants rx called to [facility] and advised to activate the savings card for victoza  Follow-up by: Pura Spice, RN,  May 14, 2009 5:06 PM

## 2010-06-11 NOTE — Progress Notes (Signed)
Summary: [facility]   Imported By: Sherian Rein 02/02/2010 09:38:03  _____________________________________________________________________  External Attachment:    Type:   Image     Comment:   External Document

## 2010-06-11 NOTE — Medication Information (Signed)
Summary: Polysomnogram/[place]  Polysomnogram/[place]   Imported By: Sherian Rein 02/02/2010 09:41:19  _____________________________________________________________________  External Attachment:    Type:   Image     Comment:   External Document

## 2010-06-11 NOTE — Assessment & Plan Note (Signed)
Summary: 3 month follow up/with fasting labs/la   CC:  Follow up visit, review of mult medical issues, and and yearly CPX....  History of Present Illness: 60 y/o WM to establish for management of mult medical problems including OSA, HBP, DM w/ neuropathy, Obesity, & other problems listed below... he is a personal friend of DrGamble, DrClance & Brett Canales Minor.   ~  April 15, 2010:  We reviewed his problem list below> he has not lost any weight & discussed diet/ exercise again "I am struggling with it", he is asked to again consider Bariatric surg & given the [facility] phone # to call... he notes some atyp CP, incr DOE w/ stairs etc, & rare self-lim palpit> and we discussed need for cardiac eval (Cath vs another Myoview- last 2006 was neg)... BP stable on meds but needs to lose wt to avoid incr meds in the future:  Chol controlled on meds;  BS is improved w/ A1c=6.8;  f/u colonoscopy due 2013;  he saw DrOttelin for Urology w/ hypogonadism, ED/ BPH- started on Androgel/ Levitra/ Uroxatrol...    Current Problems:   OBSTRUCTIVE SLEEP APNEA (ICD-327.23) - followed by DrClance & wears CPAP compliantly...   ~  he had a sleep study in 1995 (wt=235#) w/ 53 events/hr & desat to 83%- started on CPAP...  ~  9/11:  f/u OV w/ DrClance  (wt=290#) and he ordered new CPAP machine for [name], last autotitrate showed optimal pressure= 13cmH2O.  HYPERTENSION (ICD-401.9) - on AMLODIPINE 10mg /d,  LISINOPRIL 40mg /d, & FUROSEMIDE 40mg /d... BP= 148/70 & similar at home... denies HA, visual changes, CP, palipit, dizziness, syncope, dyspnea, edema, etc... he knows to elim sodium from diet & lose weight to control BP better...  ~  Myoview 7/06 showed apical thinning, no ischemia, EF= 66% & normal wall motion.  ~  EKG shows NSR, WNL...  HYPERLIPIDEMIA (ICD-272.4) - on SIMVASTATIN 40mg Qhs...  ~  FLP 12/09 showed TChol 124, TG 108, HDL 39, LDL 64  ~  FLP 11/10 showed TChol 145, TG 76, HDL 43, LDL 86  ~  FLP 11/11 showed TChol  148, TG 91, HDL 47, LDL 83  DIABETES MELLITUS, TYPE II (ICD-250.00) - on LANTUS 70u daily,  ACTOS+MET 1000-15 Bid, & GLIMEPIRIDE 4mg Bid...   ~  his Urine microalb has been +pos, w/ norm BUN & Creat...  ~  labs 12/09 showed BS= 86, A1c= 8.2  ~  labs 11/10 showed BS= 162, A1c= 8.5  ~  labs 11/11 showed BS= 114, A1c= 6.8  EXOGENOUS OBESITY (ICD-278.00) - his weight has been consistantly in the 270-290# range x yrs... we discussed low carb/ low fat diet & exercise program needed to result in substantial weight loss...  ~  weight 12/09 = 279#,  68" tall,  BMI= 42.5  ~  weight 11/10 = 276#, 68" tall,  BMI= 42  ~  weight 9/11 = 290#,  68" tall,  BMI= 44... we discussed Bariatric surgery.  ~  weight 12/11 = 290#... encouraged to call [facility] for Bariatric info.  DIVERTICULOSIS OF COLON (ICD-562.10)  ~  Colonoscopy 3/03 by Dorris Singh showed divertics, otherw neg... f/u planned 65yrs.  ~  CT scan of abd 1/04 showed diverticulosis...  TESTICULAR HYPOFUNCTION (ICD-257.2) - he has prev been on Testim, but currently not on any medication due to insurance coverage problems...  ~  labs 7/10 showed Testosterone level = 189 (350-890)  ~  labs 12/10 showed Testosterone level = 231  ~  10/11:  eval by DrOttelin for  Hypogonadism, ED, BPH> Rx w/ ANDROGEL 5gm/d, LEVITRA 20mg  Prn, UROXATROL 10mg /d...  ~  11/11:  labs here showed Testosterone level = 327, PSA= 0.56  ERECTILE DYSFUNCTION, ORGANIC (ICD-607.84) - uses VIAGRA 100mg  vs LEVITRA 20mg .  DISC DISEASE, CERVICAL (ICD-722.4) - he is s/p 2 separate cervical operations from DrHirsh in 2008 & 2009  BURSITIS, LEFT SHOULDER (ICD-726.10) - he uses OTC anti-inflamm medications Prn...  Hx of ANEMIA (ICD-285.9) - he takes MVI daily...  ~  labs 12/09 (after [facility] surg) showed Hg= 10.9, MCV= 77  ~  labs 11/10 showed Hg= 12.3, MCV= 79  ~  labs 11/11 showed Hg= 12.6, MCV= 80   Preventive Screening-Counseling & Management  Alcohol-Tobacco     Smoking Status:  never  Caffeine-Diet-Exercise     Caffeine use/day: 2 cups qd      Does Patient Exercise: yes     Type of exercise: walks      Exercise (avg: min/session): 30-60     Times/week: 4  Allergies: 1)  ! Penicillin 2)  Amoxicillin (Amoxicillin) 3)  Levaquin (Levofloxacin)  Past History:  Past Medical History: OBSTRUCTIVE SLEEP APNEA (ICD-327.23) HYPERTENSION (ICD-401.9) HYPERLIPIDEMIA (ICD-272.4) DIABETES MELLITUS, TYPE II (ICD-250.00) EXOGENOUS OBESITY (ICD-278.00) DIVERTICULOSIS OF COLON (ICD-562.10) TESTICULAR HYPOFUNCTION (ICD-257.2) ERECTILE DYSFUNCTION, ORGANIC (ICD-607.84) DISC DISEASE, CERVICAL (ICD-722.4) BURSITIS, LEFT SHOULDER (ICD-726.10) Hx of ANEMIA (ICD-285.9)  Past Surgical History: S/P cervical decompression & C5-6 fusion 12/08 by DrHirsh S/P cervical disc surg & fusion 10/09 by DrHirsh  Family History: Reviewed history from 06/13/2007 and no changes required. Father-heart disease Mother-breast CA  Social History: Reviewed history from 06/13/2007 and no changes required. Patient never smoked.  ETOH use-wine Pt is married with children. 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  Review of Systems       The patient complains of fatigue, chest pain, palpitations, dyspnea on exertion, and stiffness.  The patient denies fever, chills, sweats, anorexia, weakness, malaise, weight loss, sleep disorder, blurring, diplopia, eye irritation, eye discharge, vision loss, eye pain, photophobia, earache, ear discharge, tinnitus, decreased hearing, nasal congestion, nosebleeds, sore throat, hoarseness, syncope, orthopnea, PND, peripheral edema, cough, dyspnea at rest, excessive sputum, hemoptysis, wheezing, pleurisy, nausea, vomiting, diarrhea, constipation, change in bowel habits, abdominal pain, melena, hematochezia, jaundice, gas/bloating, indigestion/heartburn, dysphagia, odynophagia, dysuria, hematuria, urinary frequency, urinary hesitancy, nocturia,  incontinence, back pain, joint pain, joint swelling, muscle cramps, muscle weakness, arthritis, sciatica, restless legs, leg pain at night, leg pain with exertion, rash, itching, dryness, suspicious lesions, paralysis, paresthesias, seizures, tremors, vertigo, transient blindness, frequent falls, frequent headaches, difficulty walking, depression, anxiety, memory loss, confusion, cold intolerance, heat intolerance, polydipsia, polyphagia, polyuria, unusual weight change, abnormal bruising, bleeding, enlarged lymph nodes, urticaria, allergic rash, hay fever, and recurrent infections.    Vital Signs:  Patient profile:   60 year old male Height:      68 inches Weight:      290 pounds BMI:     44.25 O2 Sat:      96 % on Room air Temp:     98.5 degrees F oral Pulse rate:   86 / minute BP sitting:   148 / 70  (left arm) Cuff size:   large  Vitals Entered By: Randell Loop CMA (April 15, 2010 11:32 AM)  O2 Sat at Rest %:  96 O2 Flow:  Room air CC: Follow up visit, review of mult medical issues, and yearly CPX... Is Patient Diabetic? Yes Pain Assessment Patient in pain? no  Comments meds updated today with pt   Physical Exam  Additional Exam:  WD, Obese, 60 y/o WM in NAD... GENERAL:  Alert & oriented; pleasant & cooperative... HEENT:  [place]/AT, EOM-wnl, PERRLA, EACs-clear, TMs-wnl, NOSE-clear, THROAT-clear & wnl. NECK:  Supple w/ fairROM; no JVD; normal carotid impulses w/o bruits; no thyromegaly or nodules palpated; no lymphadenopathy. Scar of his ant cerv disc surg... CHEST:  Clear to P & A; without wheezes/ rales/ or rhonchi. HEART:  Regular Rhythm; without murmurs/ rubs/ or gallops. ABDOMEN:  Obese, soft & nontender; normal bowel sounds; no organomegaly or masses detected. EXT: without deformities, mild arthritic changes; no varicose veins/ +venous insuffic/ tr edema. NEURO:  CN's intact; motor testing normal; sensory testing normal; gait normal & balance OK. DERM:  No lesions  noted; no rash etc...    CXR  Procedure date:  04/15/2010  Findings:      CHEST - 2 VIEW Comparison: 11/28/2008   Findings: Borderline cardiac enlargement. Normal mediastinal contours and pulmonary vascularity. Lungs clear. No pleural effusion or pneumothorax. Prior cervical spine fusion. No acute osseous findings.   IMPRESSION: No acute abnormalities. Borderline cardiac enlargement.   Read By:  Lollie Marrow,  M.D.   EKG  Procedure date:  04/15/2010  Findings:      Normal sinus rhythm with rate of:  88/min... Tracing is WNL, NAD...  SN   MISC. Report  Procedure date:  04/06/2010  Findings:      Lipid Panel (LIPID)   Cholesterol               148 mg/dL                   6-213   Triglycerides             91.0 mg/dL                  0.8-657.8   HDL                       46.96 mg/dL                 >29.52   LDL Cholesterol           83 mg/dL                    8-41  BMP (METABOL)   Sodium                    141 mEq/L                   135-145   Potassium                 4.0 mEq/L                   3.5-5.1   Chloride                  104 mEq/L                   96-112   Carbon Dioxide            29 mEq/L                    19-32   Glucose              [H]  114 mg/dL  70-99   BUN                  [H]  27 mg/dL                    1-61   Creatinine                1.0 mg/dL                   0.9-6.0   Calcium                   9.2 mg/dL                   4.5-40.9   GFR                       78.39 mL/min                >60  Hepatic/Liver Function Panel (HEPATIC)   Total Bilirubin           0.5 mg/dL                   8.1-1.9   Direct Bilirubin          0.1 mg/dL                   1.4-7.8   Alkaline Phosphatase      67 U/L                      39-117   AST                       17 U/L                      0-37   ALT                       17 U/L                      0-53   Total Protein             6.9 g/dL                    2.9-5.6   Albumin                    3.9 g/dL                    2.1-3.0  Vitamin D (25-Hydroxy) (86578)  Vitamin D (25-Hydroxy)                             30 ng/mL                    30-89  Comments:      CBC Platelet w/Diff (CBCD)   White Cell Count          5.8 K/uL                    4.5-10.5   Red Cell Count            4.71 Mil/uL                 4.22-5.81   Hemoglobin           [  L]  12.6 g/dL                   45.4-09.8   Hematocrit           [L]  37.8 %                      39.0-52.0   MCV                       80.3 fl                     78.0-100.0   Platelet Count            309.0 K/uL                  150.0-400.0   Neutrophil %              65.9 %                      43.0-77.0   Lymphocyte %              22.5 %                      12.0-46.0   Monocyte %                6.7 %                       3.0-12.0   Eosinophils%              4.4 %                       0.0-5.0   Basophils %               0.5 %                       0.0-3.0  TSH (TSH)   FastTSH                   2.01 uIU/mL                 0.35-5.50  Hemoglobin A1C (A1C)   Hemoglobin A1C       [H]  6.8 %                       4.6-6.5  Prostate Specific Antigen (PSA)   PSA-Hyb                   0.56 ng/mL                  0.10-4.00  Testosterone, Total (TESTO)   Testosterone         [L]  326.70 ng/dL                119.14-782.95  UDip w/Micro (URINE)   Color                     YELLOW   Clarity                   CLEAR                       Clear   Specific Gravity          >=  1.030                     1.000 - 1.030   Urine Ph                  5.0                         5.0-8.0   Protein                   100                         Negative   Urine Glucose             NEGATIVE                    Negative   Ketones                   NEGATIVE                    Negative   Urine Bilirubin           NEGATIVE                    Negative   Blood                     NEGATIVE                    Negative   Urobilinogen              0.2                          0.0 - 1.0   Leukocyte Esterace        NEGATIVE                    Negative   Nitrite                   NEGATIVE                    Negative   Urine WBC                 0-2/hpf                     0-2/hpf   Impression & Recommendations:  Problem # 1:  PHYSICAL EXAMINATION (ICD-V70.0) He notes some atypic CP but has incr SOB/ DOE & needs cardiac eval ?cath w/ his severe risk factors... Orders: 12 Lead EKG (12 Lead EKG) T-2 View CXR (71020TC) LABS done 04/06/10...  Problem # 2:  OBSTRUCTIVE SLEEP APNEA (ICD-327.23) Per DrClance...  Problem # 3:  HYPERTENSION (ICD-401.9) Controlled on meds, but must lose weight... His updated medication list for this problem includes:    Amlodipine Besylate 10 Mg Tabs (Amlodipine besylate) ..... Once daily    Lisinopril 40 Mg Tabs (Lisinopril) ..... Once daily    Furosemide 40 Mg Tabs (Furosemide) ..... Once daily  Problem # 4:  HYPERLIPIDEMIA (ICD-272.4) Controlled on Simva40>  continue same. His updated medication list for this problem includes:    Simvastatin 40 Mg Tabs (Simvastatin) .Marland Kitchen... Take 1 tablet by mouth at bedtime  Problem # 5:  EXOGENOUS OBESITY (ICD-278.00) Weight reduction is  key>  rec bariatric eval by [facility]...  Problem # 6:  DIVERTICULOSIS OF COLON (ICD-562.10) Follow up colon due 2013...  Problem # 7:  TESTICULAR HYPOFUNCTION (ICD-257.2) Followed by DrOttelin...  Problem # 8:  OTHER MEDICAL PROBLEMS AS NOTED>>>  Complete Medication List: 1)  Fexofenadine Hcl 180 Mg Tabs (Fexofenadine hcl) .Marland Kitchen.. 1 once daily for allergy 2)  Adult Aspirin Low Strength 81 Mg Chew (Aspirin) .... Take 1 tablet by mouth once a day 3)  Amlodipine Besylate 10 Mg Tabs (Amlodipine besylate) .... Once daily 4)  Lisinopril 40 Mg Tabs (Lisinopril) .... Once daily 5)  Furosemide 40 Mg Tabs (Furosemide) .... Once daily 6)  Simvastatin 40 Mg Tabs (Simvastatin) .... Take 1 tablet by mouth at bedtime 7)  Lantus 100 Unit/ml Soln (Insulin  glargine) .... As directed 70 units once daily 8)  Actoplus Met Xr 15-1000 Mg Xr24h-tab (Pioglitazone hcl-metformin hcl) .... Take 1 tab by mouth two times a day... 9)  Glimepiride 4 Mg Tabs (Glimepiride) .... Take 1 tablet by mouth twice a day 10)  Onglyza 5 Mg Tabs (Saxagliptin hcl) .... Take 1 tab by mouth once daily... 11)  Alfuzosin Hcl 10 Mg Xr24h-tab (Alfuzosin hcl) .... Take one tablet by mouth daily 12)  Androgel Pump 1 % Gel (Testosterone) .... 2 pumps per day, 5.0 gms qd 13)  Viagra 100 Mg Tabs (Sildenafil citrate) .Marland Kitchen.. 1 as instructed 14)  Levitra 20 Mg Tabs (Vardenafil hcl) .... Once daily 15)  Aleve 220 Mg Tabs (Naproxen sodium) .... As needed for pain 16)  Freestyle Lancets Misc (Lancets) .... To test daily 17)  Freestyle Lite Test Strp (Glucose blood) .... To test daily 18)  Accu-chek Comfort Curve Soln (Blood glucose calibration) 19)  Bd Insulin Syringe Ultrafine 29g X 1/2" 1 Ml Misc (Insulin syringe-needle u-100) .... Use as directed  Other Orders: Cardiology Referral (Cardiology)  Patient Instructions: 1)  Today we updated your med list- see below.... 2)  Continue your current meds the same... 3)  Let's get on track w/ our diet & exercise program... the goal is to lose 20-30 lbs over the next 6months... 4)  Consider inquiry into the [facility] program> call [phone] 5)  Today we reviewed your recent blood work- and we did a follow up CXR & EKG... please call the "phone tree" in a few days for your results.Marland KitchenMarland Kitchen 6)  We will arrange for a thorough cardiac eval at the [facility] center... 7)  Call for any questions... 8)  Please schedule a follow-up appointment in 6 months. Prescriptions: SIMVASTATIN 40 MG TABS (SIMVASTATIN) Take 1 tablet by mouth at bedtime  #30 x 11   Entered by:   Randell Loop CMA   Authorized by:   Michele Mcalpine MD   Signed by:   Randell Loop CMA on 04/15/2010   Method used:   Electronically to        [facility]* (retail)       [address] [place], [place]  [address]       Ph: [phone]       Fax: [phone]   RxID:   [id number] FUROSEMIDE 40 MG  TABS (FUROSEMIDE) once daily  #30 x 11   Entered by:   Randell Loop CMA   Authorized by:   Michele Mcalpine MD   Signed by:   Randell Loop CMA on 04/15/2010  Method used:   Electronically to        [facility]* (retail)       [address] [place], [place]  [address]       Ph: [phone]       Fax: [phone]   RxID:   [id number] LISINOPRIL 40 MG  TABS (LISINOPRIL) once daily  #30 x 11   Entered by:   Randell Loop CMA   Authorized by:   Michele Mcalpine MD   Signed by:   Randell Loop CMA on 04/15/2010   Method used:   Electronically to        [facility]* (retail)       [address] [place], [place]  [address]       Ph: [phone]       Fax: [phone]   RxID:   [id number] AMLODIPINE BESYLATE 10 MG  TABS (AMLODIPINE BESYLATE) once daily  #30 Tablet x 11   Entered by:   Randell Loop CMA   Authorized by:   Michele Mcalpine MD   Signed by:   Randell Loop CMA on 04/15/2010   Method used:   Electronically to        [facility]* (retail)       [address] [place], [place]  [address]       Ph: [phone]       Fax: [phone]   RxID:   [id number] FEXOFENADINE HCL 180 MG TABS (FEXOFENADINE HCL) 1 once daily for allergy  #30 x 11   Entered by:   Randell Loop CMA   Authorized by:   Michele Mcalpine MD   Signed by:   Randell Loop CMA on 04/15/2010   Method used:   Electronically to        [facility]* (retail)       [address] [place], [place]  [address]       Ph: [phone]       Fax: [phone]   RxID:   [id number] BD INSULIN SYRINGE  ULTRAFINE 29G X 1/2" 1 ML MISC (INSULIN SYRINGE-NEEDLE U-100) use as directed  #1box x 3   Entered by:   [name] CMA   Authorized by:   Michele Mcalpine MD   Signed by:   [name] CMA on 04/15/2010   Method used:   Print then Give to Patient   RxID:   [id number] LANTUS 100 UNIT/ML  SOLN (INSULIN GLARGINE) as directed 70 units once daily  #90 day x 3   Entered by:   [name] CMA   Authorized by:   Michele Mcalpine MD   Signed by:   [name] CMA on 04/15/2010   Method used:   Print then Give to Patient   RxID:   [id number] ACTOPLUS MET XR 15-1000 MG XR24H-TAB (PIOGLITAZONE HCL-METFORMIN HCL) take 1 tab by mouth two times a day...  #180 x 3   Entered by:   Gweneth Dimitri RN   Authorized by:   Michele Mcalpine MD   Signed by:   Gweneth Dimitri RN on 04/15/2010  Method used:   Print then Give to Patient   RxID:   [id number]

## 2010-06-11 NOTE — Consult Note (Signed)
[facility]: [facility]   Imported By: Sherian Rein 03/13/2010 11:36:48  _____________________________________________________________________  External Attachment:    Type:   Image     Comment:   External Document

## 2010-06-11 NOTE — Progress Notes (Signed)
Summary: BS readings  Phone Note Call from Patient   Caller: Patient Summary of Call: pt called to report he thinks he is to take lantus 50 units in am and 50 units in the pm. stated ellen called in lantus with directions 70 units daily.  Initial call taken by: Pura Spice, RN,  June 19, 2009 4:42 PM  Follow-up for Phone Call        dr stafford wants to know what his CBG's are   blood gases?  Not sure what you need? Lynann [facility]  June 19, 2009 4:58 PM  Follow-up by: Pura Spice, RN,  June 19, 2009 4:43 PM  Additional Follow-up for Phone Call Additional follow up Details #1::        need rsults from his blood sugars  that he takes from his mointor Additional Follow-up by: Pura Spice, RN,  June 19, 2009 4:58 PM    Additional Follow-up for Phone Call Additional follow up Details #2::    140---160  in the evening after dinner FBS: 50---80. within the last 2 weeks Follow-up by: Lynann Beaver CMA,  June 19, 2009 5:03 PM  Additional Follow-up for Phone Call Additional follow up Details #3:: Details for Additional Follow-up Action Taken: continue Lantus 70 units qd Additional Follow-up by: Judithann Sheen MD,  June 19, 2009 5:28 PM

## 2010-08-27 ENCOUNTER — Telehealth: Payer: Self-pay | Admitting: Pulmonary Disease

## 2010-08-27 NOTE — Telephone Encounter (Signed)
Per SN---pt is wanting to change back to solostar pen---ok to change and send in for refills. thanks

## 2010-08-27 NOTE — Telephone Encounter (Signed)
Spoke w/ pt and he states he is currently on vials of lantus and wants to go back to lantus pins. Pt states this was changed back in December 2011 by Dr. Kriste Basque.  Pt states the lantus pins is more convenient for him. Pt would like a written rx for this and pick it up. Pt states he needs rx to say Lantus solstar U100 pins 15 mL white #75 for 90 days supply as well as a rx for BD ultra fine pin needle 31 gage short. Please advise Dr. Kriste Basque. Thanks

## 2010-08-27 NOTE — Telephone Encounter (Signed)
LMOMTCB

## 2010-08-28 MED ORDER — INSULIN PEN NEEDLE 31G X 8 MM MISC: Status: DC

## 2010-08-28 MED ORDER — INSULIN GLARGINE 100 UNIT/ML ~~LOC~~ SOLN: 70.0000 [IU] | Freq: Every day | SUBCUTANEOUS | Status: DC

## 2010-08-28 NOTE — Telephone Encounter (Signed)
Spoke with pt and he wants rx mailed to him. Rx printed, signed and mailed to patient. Carron Curie, CMA

## 2010-09-22 NOTE — Op Note (Signed)
NAME:  [name] [name], REEDER NO.:  [id number]   MEDICAL RECORD NO.:  [id number]          PATIENT TYPE:  OIB   LOCATION:  3524                         FACILITY:  [facility]   PHYSICIAN:  Clydene Fake, M.D.  DATE OF BIRTH:  [birth year]   DATE OF PROCEDURE:  04/24/2007  DATE OF DISCHARGE:                               OPERATIVE REPORT   PREOPERATIVE DIAGNOSES:  Herniated nucleosis pulposis, spondylosis C5-6  with left-sided radiculopathy.   POSTOPERATIVE DIAGNOSES:  Herniated nucleosis pulposis, spondylosis C5-6  with left-sided radiculopathy.   PROCEDURE:  Intracervical decompression, diskectomy and fusion of C5-6  with LifeNet allograft bone, Trestle  anterior cervical plate.   SURGEON:  Clydene Fake, M.D.   ASSISTANT:  Cristi Loron, M.D.   General endotracheal tube with anesthesia   ESTIMATED BLOOD LOSS:  Minimal.   BLOOD GIVEN:  None.   DRAINS:  None.   COMPLICATIONS:  None.   REASON FOR PROCEDURE:  The patient is a 60 year old gentleman who has  been having neck and left arm pain, numbness, and weakness.  An MRI is  showing disk herniation left side at C5-6 with some multiple other areas  of spondylitic change, nothing causing a root compression.  The patient  has some weakness left C6 root along with decreased sensation.  The  patient was brought in for decompression and fusion.  He has been  treated with steroids and other conservative management and has not  improved.   PROCEDURE IN DETAIL:  The patient was brought into the operating room  and general anesthesia induced.  The patient was placed in 10 pounds of  halter traction, prepped and draped in a sterile fashion.  Site of  incision was injected with 10 mL of 1% Lidocaine with epinephrine.  Incision was then made from the midline to the anterior border of the  sternocleidomastoid muscle on the left side, neck incision taken down to  the platysma and hemostasis obtained with Bovie  cauterization.  The  platysma was incised with a Bovie and blunt dissection taken through the  anterior cervical fascia to the anterior cervical spine.  Needles were  placed in two interspaces.  X-rays were obtainedto ensure that the  needles were at 4-5 in the disk space, the second needle could not be  seen but it was at the next space down, considered to be the 5-6 space.  The 5-6 disk  space was incised with a 15 blade.  The needles were  removed and partial discectomy performed.  The longus coli  a muscles  were reflected laterally using the Bovie and self-retaining retractors  were placed. We then removed the anterior aspect with Kerrison  punchesand continued diskectomy and placed distraction pins into C5 and  6 and distracted the interspace.  Microscope was brought in for  microdissection at this point and discectomy continued with pituitary  rongeurs and curets.  Posterior disk ligament and osteophytes were then  removed with 1 and 2 mm Kerrison sponges, decompressed at the center of  the canal, then performing bilateral foraminotomy.  We found soft  fragments of disk  out on the left side.  We kept dissecting it lateral  and lateral over the nerve root pulling out more fragments of disk and  decompressing the nerve root.  We ended up with a very extensive  foraminotomy on the left side, decompressing the left C6 root.  Hemostasis was obtained with Gelfoam and Thrombin.  This was then  irrigated out.  We used a high-speed drill to remove cartilaginous end  plate, measured the height of the disk space to be 6 mm and a 6 mm  LifeNet allograft bone was tapped into place and countersunk a couple of  millimeters.  We checked the posterior aspect of the graft with a nerve  hook, and there is plenty of room between the bone graft and the dura.  Distraction and distraction pins were removed.  Weight was removed from  the traction and bone was in good position and firmly in place.  We   irrigated with antibiotics solution.  The Trestle anterior cervical  plate was placed over the anterior cervical spine with two screws placed  in the C5, two  in the C6.  These were tightened down.  Lateral x-rays  were obtained showing the screws at C5.  We really could not see the  bottom screws or bone plug too well, but we knew we were at the 5-6  level.  Intraoperatively had good position of instrumentation and bone  plug.  Again retractors were removed We had good hemostasis.  We  irrigated with antibiotics solution.  We closed the platysma with 3-0  Vicryl interrupted suture, closed the subcutaneous tissue with the same.  Skin closed with benzoin and Steri-strips.  Dressing was placed.  The  patient was placed in a soft sterile collar, woken from anesthesia and  transferred to the recovery room in stable condition.           ______________________________  Clydene Fake, M.D.     JRH/MEDQ  D:  04/24/2007  T:  04/24/2007  Job:  [id number]

## 2010-09-22 NOTE — Op Note (Signed)
NAME:  DAREION, KNEECE NO.:  [id number]   MEDICAL RECORD NO.:  [id number]          PATIENT TYPE:  INP   LOCATION:  3536                         FACILITY:  [facility]   PHYSICIAN:  Clydene Fake, M.D.  DATE OF BIRTH:  08-02-1950   DATE OF PROCEDURE:  03/07/2008  DATE OF DISCHARGE:                               OPERATIVE REPORT   PREOPERATIVE DIAGNOSES:  Cervical spondylosis, femoral stenosis C5-6, 6-  7 and 7-1 with radiculopathy and herniated nucleus pulposus at 7-1.   POSTOPERATIVE DIAGNOSES:  Cervical spondylosis, femoral stenosis C5-6, 6-  7 and 7-1 with radiculopathy and herniated nucleus pulposus at 7-1.   PROCEDURES:  Left C5-6 foraminotomy, left and right C6-7 foraminotomy,  right C7-T1 foraminotomy and diskectomy, microdissection, posterior  fusion C5 through T1 (that is 3 levels), segmented Alphatec  instrumentation C5 through T1, autograft, allograft.   SURGEON:  Clydene Fake, MD   ASSISTANT:  Hewitt Shorts, MD   ANESTHESIA:  General by endotracheal tube.   ESTIMATED BLOOD LOSS:  400 mL.   BLOOD GIVEN:  None.   DRAINS:  None.   COMPLICATIONS:  None.   REASON FOR PROCEDURE:  The patient is a 60 year old gentleman who has  had a C5-6 ACF and still have some residual numbness in the left side,  but he developed acute right-sided radicular symptoms.  MRI shows some  spondylitic changes and some femoral narrowing C5-6, bilateral C6-7 and  C7-1 large disk herniation of the foramen, right-sided C7-1.  The  patient brought in for posterior decompression and fusion.   PROCEDURE IN DETAIL:  The patient was brought in the operating room,  general anesthesia was induced.  The patient was placed in [place] head  pins and then placed in a prone position with all pressure points were  padded.  The patient was prepped and draped in sterile fashion.  He had  his incision injected with 20 mL of 1% lidocaine with epinephrine.  An  incision was then made  in the midline of cervicothoracic spine.  Dissection was carried down to the fascia.  Hemostasis was obtained by  Bovie cauterization.  The fascia was incised and subperiosteal  dissection was done from C5 through top of T2 and carried out laterally  to the lateral masses.  X-ray was attempted to assure levels and with a  marker at 3-4 and 4-5.  X-ray were done and we could see the marker at 3-  4 and we could count down from there.  The 5-6 level was fused  anteriorly and it was obvious that these two levels moved together and  also confirmed our positioning and the pedicle entry points of the  transverse process of T1 was also prominent.  The screw entry points of  lateral masses at C5-6 and C7 were then found.  Started with an awl and  then using a drill, we opened out and drilled the hole first on the  right side and then on the left, tapped each hole and these holes were  ready to give fresh screws later in the case.  We then starting  on the  left side at C5-6, foraminotomy was done with high-speed drill and then  the Kerrison punches.  Microscope was brought in for microdissection as  we uncovered the nerve root and going out of the foramen to make sure we  had good decompression of that right C6 nerve root.  When we were  finished, we had good decompression and hemostasis obtained with Gelfoam  and thrombin.  This process was repeated at C6-7 level.  High-speed  drill and then Kerrison punches were used to do laminotomy and  foraminotomy to decompress the C7 nerve root as I went out its foramen,  and this was done well and when we were finished, we had good  decompression and then attention was taken to the right side at C6-7 and  foraminotomy performed with high-speed drill and Kerrison punches under  microdissection to decompressed the lateral canal and the foramen  decompressing the right C6-7 nerve root and then attention was taken to  the C7-T1 level on the right side.   High-speed drill and Kerrison  punches were used to start laminotomy and foraminotomy.  We explored the  nerve roots.  We opened the ligament over the root, getting hemostasis  at the epidural vessels with bipolar cauterization and to explored the  nerve root and the disk cephalad to the nerve root, large disk  protrusion was seen.  __________ microdissection, we were able to  evacuate 4 or 5 pieces of disk that was free fragment up under the nerve  roots.  When we were finished, we had no further disk fragments could be  found.  The nerve was well decompressed.  Hemostasis obtained with  bipolar cauterization and Gelfoam and thrombin.  There, we could feel  the pedicle of T1 just below the nerve root.  Pedicle entry point was  then made with the awl.  The drill was used to drill down the pedicle  into the body of T1, felt with a small ball probe with good bony  circumference all the way down.  The hole was tapped and then a 22-mm  screw from the Alphatec posterior cervical instrumentation.  It was then  placed down at right T1 pedicle.  Attention was then taken to the  pedicle entry point on the left side using the right as a guide.  We  used awl to start the entry and we slowly drilled down the pedicle into  the vertebral body of T1 every 15 mm striking with a small ball probe  making sure we had a good bony circumference and then we tapped the hole  and placed the screw there.  We then placed screws in the lateral masses  of C5, C6, and C7 bilaterally where we had already prepared the holes,  60-mm screws were placed at 5 and 6 and the 40-mm screw placed at C7.  Rods were cut to size, placed in the screw heads one on each side and  locking caps were then placed.  We then finally tightened the caps.  We  used high-speed. Drill to decorticate lateral facets and lamina from C5  through T1 and then placed Infuse BMP for postop fusion and/or  foraminotomy.  In the laminotomy areas, we placed  a Gelfoam __________  and BMP in order to get near the nerve roots.  We then used some of the  autograft bone that we obtained from our foraminotomy was then packed in  the posterior and posterior lateral space, and then  Vitoss sponge pack  was cut to strips and placed posteriorly for posterior fusion from C5-T1  bilaterally.  We had good hemostasis, retractors were removed.  Fascia  closed with 0 Vicryl interrupted sutures.  Subcutaneous tissue was  closed with 0, 2-0, and 3-0 Vicryl interrupted sutures.  Skin closed  with benzoin and Steri-Strips.  Dressing was placed.  The patient was  placed back into supine the position, awoken from anesthesia, and  transferred to the recovery room in stable condition.           ______________________________  Clydene Fake, M.D.     JRH/MEDQ  D:  03/07/2008  T:  03/08/2008  Job:  [id number]

## 2010-10-02 ENCOUNTER — Encounter: Payer: Self-pay | Admitting: Pulmonary Disease

## 2010-10-14 ENCOUNTER — Ambulatory Visit (INDEPENDENT_AMBULATORY_CARE_PROVIDER_SITE_OTHER): Payer: Managed Care, Other (non HMO) | Admitting: Pulmonary Disease

## 2010-10-14 ENCOUNTER — Other Ambulatory Visit (INDEPENDENT_AMBULATORY_CARE_PROVIDER_SITE_OTHER): Payer: Managed Care, Other (non HMO)

## 2010-10-14 DIAGNOSIS — M5030 Other cervical disc degeneration, unspecified cervical region: Secondary | ICD-10-CM

## 2010-10-14 DIAGNOSIS — E785 Hyperlipidemia, unspecified: Secondary | ICD-10-CM

## 2010-10-14 DIAGNOSIS — I10 Essential (primary) hypertension: Secondary | ICD-10-CM

## 2010-10-14 DIAGNOSIS — G4733 Obstructive sleep apnea (adult) (pediatric): Secondary | ICD-10-CM

## 2010-10-14 DIAGNOSIS — E119 Type 2 diabetes mellitus without complications: Secondary | ICD-10-CM

## 2010-10-14 DIAGNOSIS — D649 Anemia, unspecified: Secondary | ICD-10-CM

## 2010-10-14 DIAGNOSIS — E669 Obesity, unspecified: Secondary | ICD-10-CM

## 2010-10-14 DIAGNOSIS — E291 Testicular hypofunction: Secondary | ICD-10-CM

## 2010-10-14 LAB — BASIC METABOLIC PANEL
BUN: 20 mg/dL (ref 6–23)
Chloride: 103 mEq/L (ref 96–112)
Glucose, Bld: 176 mg/dL — ABNORMAL HIGH (ref 70–99)
Potassium: 4.3 mEq/L (ref 3.5–5.1)

## 2010-10-14 MED ORDER — PIOGLITAZONE HCL-METFORMIN ER 15-1000 MG PO TB24: ORAL_TABLET | ORAL | Status: DC

## 2010-10-14 NOTE — Progress Notes (Signed)
Subjective:    Patient ID: [name] [name], male    DOB: 1950-05-16, 60 y.o.   MRN: [id number]  HPI 60 y/o WM to establish for management of mult medical problems including OSA, HBP, DM w/ neuropathy, Obesity, & other problems listed below... he is a personal friend of DrGamble, DrClance & Brett Canales Minor.  ~  April 15, 2010:  We reviewed his problem list below> he has not lost any weight & discussed diet/ exercise again "I am struggling with it", he is asked to again consider Bariatric surg & given the [facility] phone # to call... he notes some atyp CP, incr DOE w/ stairs etc, & rare self-lim palpit> and we discussed need for cardiac eval (Cath vs another Myoview- last 2006 was neg)... BP stable on meds but needs to lose wt to avoid incr meds in the future:  Chol controlled on meds;  BS is improved w/ A1c=6.8;  f/u colonoscopy due 2013;  he saw [name] [name] for Urology w/ hypogonadism, ED/ BPH- started on Androgel/ Levitra/ Uroxatrol...  ~  October 14, 2010:  54mo ROV & he feels that he is doing satis, but unfortunately has not lost any weight> he is again referred to the [facility] Bariatric Surg program & he will inquire... He tells me that his company pays for a health coach to check his meds & they scared him... We reviewed the indication for a follow up Cards eval due to mult risk factors... We reviewed labs & XRays from 11/11> f/u BS=176, A1c=7.1 (on Lantus, Actos+Met, & Glimep)... See prob list below>>   Problem List:  OBSTRUCTIVE SLEEP APNEA (ICD-327.23) - followed by DrClance & wears CPAP compliantly...  ~  he had a sleep study in 1995 (wt=235#) w/ 53 events/hr & desat to 83%- started on CPAP... ~  9/11:  f/u OV w/ DrClance  (wt=290#) and he ordered new CPAP machine for [name] [name], last autotitrate showed optimal pressure= 13cmH2O.  HYPERTENSION (ICD-401.9) - on AMLODIPINE 10mg /d,  LISINOPRIL 40mg /d, & FUROSEMIDE 40mg /d... BP= 150/80 & similar at home... denies HA, visual changes, CP, palipit, dizziness, syncope,  dyspnea, edema, etc... he knows to elim sodium from diet & lose weight to control BP better... ~  Myoview 7/06 showed apical thinning, no ischemia, EF= 66% & normal wall motion. ~  EKG shows NSR, WNL...  HYPERLIPIDEMIA (ICD-272.4) - on SIMVASTATIN 40mg Qhs... ~  FLP 12/09 showed TChol 124, TG 108, HDL 39, LDL 64 ~  FLP 11/10 showed TChol 145, TG 76, HDL 43, LDL 86 ~  FLP 11/11 showed TChol 148, TG 91, HDL 47, LDL 83  DIABETES MELLITUS, TYPE II (ICD-250.00) - on LANTUS 70u daily,  ACTOS+MET 1000-15 Bid, & GLIMEPIRIDE 4mg Bid...  ~  his Urine microalb has been +pos, w/ norm BUN & Creat... ~  labs 12/09 showed BS= 86, A1c= 8.2 ~  labs 11/10 showed BS= 162, A1c= 8.5 ~  labs 11/11 showed BS= 114, A1c= 6.8 ~  Labs 6/12 showed BS= 176, A1c= 7.1... We discussed the benefits to his DM from Bariatric surg.  EXOGENOUS OBESITY (ICD-278.00) - his weight has been consistantly in the 270-290# range x yrs... we discussed low carb/ low fat diet & exercise program needed to result in substantial weight loss... ~  weight 12/09 = 279#,  68" tall,  BMI= 42.5 ~  weight 11/10 = 276#, 68" tall,  BMI= 42 ~  weight 9/11 = 290#,  68" tall,  BMI= 44... we discussed Bariatric surgery. ~  weight 12/11 = 290# ~  Weight 6/12 = 289# and he is encouraged to call [facility] regarding their Bariatric program.  DIVERTICULOSIS OF COLON (ICD-562.10) ~  Colonoscopy 3/03 by [name] [name] showed divertics, otherw neg... f/u planned 73yrs. ~  CT scan of abd 1/04 showed diverticulosis...  TESTICULAR HYPOFUNCTION (ICD-257.2) - he has prev been on Testim, but on & off medication due to insurance coverage problems... ~  labs 7/10 showed Testosterone level = 189 (350-890) ~  labs 12/10 showed Testosterone level = 231 ~  10/11:  eval by [name] [name] for Hypogonadism, ED, BPH> Rx w/ ANDROGEL 5gm/d, LEVITRA 20mg  Prn, UROXATROL 10mg /d... ~  11/11:  labs here showed Testosterone level = 327, PSA= 0.56  ERECTILE DYSFUNCTION, ORGANIC (ICD-607.84) - uses  VIAGRA 100mg  vs LEVITRA 20mg .  DISC DISEASE, CERVICAL (ICD-722.4) - he is s/p 2 separate cervical operations from DrHirsh in 2008 & 2009  BURSITIS, LEFT SHOULDER (ICD-726.10) - he uses OTC anti-inflamm medications Prn...  Hx of ANEMIA (ICD-285.9) - he takes MVI daily... ~  labs 12/09 (after [facility] surg) showed Hg= 10.9, MCV= 77 ~  labs 11/10 showed Hg= 12.3, MCV= 79 ~  labs 11/11 showed Hg= 12.6, MCV= 80   Past Surgical History  Procedure Date  . Posterior laminectomy / decompression cervical spine     c5-6 fusion 12/08 Dr. Blanche East  . S/p cervical disc surgery and fusion 10/09    Dr. Blanche East    Outpatient Encounter Prescriptions as of 10/14/2010  Medication Sig Dispense Refill  . alfuzosin (UROXATRAL) 10 MG 24 hr tablet Take 10 mg by mouth daily.        Marland Kitchen amLODipine (NORVASC) 10 MG tablet Take 10 mg by mouth daily.        Marland Kitchen aspirin 81 MG tablet Take 81 mg by mouth daily.        . furosemide (LASIX) 40 MG tablet Take 40 mg by mouth daily.        Marland Kitchen glimepiride (AMARYL) 4 MG tablet Take 4 mg by mouth 2 (two) times daily.        . insulin glargine (LANTUS SOLOSTAR) 100 UNIT/ML injection Inject 70 Units into the skin at bedtime.        . Insulin Pen Needle 31G X 8 MM MISC Use once daily as directed  100 each  3  . lisinopril (PRINIVIL,ZESTRIL) 40 MG tablet Take 40 mg by mouth daily.        . Pioglitazone HCl-Metformin HCl (ACTOPLUS MET XR) 15-1000 MG TB24 1 tablet twice a day       . saxagliptin HCl (ONGLYZA) 5 MG TABS tablet Take 5 mg by mouth daily.        . simvastatin (ZOCOR) 40 MG tablet Take 40 mg by mouth at bedtime.        . Testosterone (ANDROGEL PUMP) 1.25 GM/ACT (1%) GEL 2 pumps per day, 5.0 gms once a day       . vardenafil (LEVITRA) 20 MG tablet Take 20 mg by mouth daily.        . fexofenadine (ALLEGRA) 180 MG tablet Take 180 mg by mouth daily.        . naproxen sodium (ALEVE) 220 MG tablet As needed for pain       . sildenafil (VIAGRA) 100 MG tablet Take 100 mg by mouth daily  as needed.          Allergies  Allergen Reactions  . Amoxicillin     REACTION: unspecified  . Levofloxacin     REACTION: unspecified  .  Penicillins     Review of Systems        The patient complains of fatigue, chest pain, palpitations, dyspnea on exertion, and stiffness.  The patient denies fever, chills, sweats, anorexia, weakness, malaise, weight loss, sleep disorder, blurring, diplopia, eye irritation, eye discharge, vision loss, eye pain, photophobia, earache, ear discharge, tinnitus, decreased hearing, nasal congestion, nosebleeds, sore throat, hoarseness, syncope, orthopnea, PND, peripheral edema, cough, dyspnea at rest, excessive sputum, hemoptysis, wheezing, pleurisy, nausea, vomiting, diarrhea, constipation, change in bowel habits, abdominal pain, melena, hematochezia, jaundice, gas/bloating, indigestion/heartburn, dysphagia, odynophagia, dysuria, hematuria, urinary frequency, urinary hesitancy, nocturia, incontinence, back pain, joint pain, joint swelling, muscle cramps, muscle weakness, arthritis, sciatica, restless legs, leg pain at night, leg pain with exertion, rash, itching, dryness, suspicious lesions, paralysis, paresthesias, seizures, tremors, vertigo, transient blindness, frequent falls, frequent headaches, difficulty walking, depression, anxiety, memory loss, confusion, cold intolerance, heat intolerance, polydipsia, polyphagia, polyuria, unusual weight change, abnormal bruising, bleeding, enlarged lymph nodes, urticaria, allergic rash, hay fever, and recurrent infections.    Objective:   Physical Exam     WD, Obese, 60 y/o WM in NAD... GENERAL:  Alert & oriented; pleasant & cooperative... HEENT:  [place]/AT, EOM-wnl, PERRLA, EACs-clear, TMs-wnl, NOSE-clear, THROAT-clear & wnl. NECK:  Supple w/ fairROM; no JVD; normal carotid impulses w/o bruits; no thyromegaly or nodules palpated; no lymphadenopathy. Scar of his ant cerv disc surg... CHEST:  Clear to P & A; without wheezes/  rales/ or rhonchi. HEART:  Regular Rhythm; without murmurs/ rubs/ or gallops. ABDOMEN:  Obese, soft & nontender; normal bowel sounds; no organomegaly or masses detected. EXT: without deformities, mild arthritic changes; no varicose veins/ +venous insuffic/ tr edema. NEURO:  CN's intact; motor testing normal; sensory testing normal; gait normal & balance OK. DERM:  No lesions noted; no rash etc...   Assessment & Plan:   OSA>  Followed by DrClance; he uses his CPAP nightly; denies daytime hypersomnolence etc...  HBP>  Controlled on his 3 meds + diet etc; he understands how weight reduction would be very beneficial & help him to come off meds...  HYPERCHOLESTEROLEMIA>  On Simva40 w/ good numbers...  DM>  On Lantus, Glimep. Actos+Met... Control is fair & very weight dependent...  OBESITY>  Weight reduction is critical to his health & longevity... He is rec to discuss options w/ [facility]...  Testic Hypofunction> Followed by [name] [name] for Urology on Androgel...  Other medical issues as noted.Marland KitchenMarland Kitchen

## 2010-10-14 NOTE — Patient Instructions (Signed)
Today we updated your med list in EPIC...  Today we did your follow up DM blood work...    Please call the [facility] in a few days for your results...    Dial [id number] & when prompted enter your patient number followed by the # symbol...    Your patient number is:  [id number]#  We will arrange for that Cardiology screening eval w/ DrCrenshaw & his team...  Consider calling Dr. Wenda Low & Ovidio Kin at [facility] regarding their Bariatric program...    Their number is [phone]  If your throat symptoms persist or you develop a dry irritative nagging cough> this could be a manifestation of the ACE inhibitor medication you are taking (Lisinopril)... We can change that drug to a different class if necessary...  Let's plan another follow up visit in 6 months.Marland KitchenMarland Kitchen

## 2010-10-18 ENCOUNTER — Encounter: Payer: Self-pay | Admitting: Pulmonary Disease

## 2010-10-26 ENCOUNTER — Other Ambulatory Visit: Payer: Self-pay | Admitting: Pulmonary Disease

## 2010-11-09 ENCOUNTER — Encounter: Payer: Self-pay | Admitting: Cardiology

## 2010-11-09 ENCOUNTER — Ambulatory Visit (INDEPENDENT_AMBULATORY_CARE_PROVIDER_SITE_OTHER): Payer: Managed Care, Other (non HMO) | Admitting: Cardiology

## 2010-11-09 DIAGNOSIS — R0609 Other forms of dyspnea: Secondary | ICD-10-CM | POA: Insufficient documentation

## 2010-11-09 DIAGNOSIS — E7800 Pure hypercholesterolemia, unspecified: Secondary | ICD-10-CM

## 2010-11-09 DIAGNOSIS — R0602 Shortness of breath: Secondary | ICD-10-CM

## 2010-11-09 DIAGNOSIS — R0600 Dyspnea, unspecified: Secondary | ICD-10-CM | POA: Insufficient documentation

## 2010-11-09 DIAGNOSIS — I10 Essential (primary) hypertension: Secondary | ICD-10-CM

## 2010-11-09 MED ORDER — PRAVASTATIN SODIUM 40 MG PO TABS: 40.0000 mg | ORAL_TABLET | Freq: Every evening | ORAL | Status: DC

## 2010-11-09 NOTE — Patient Instructions (Signed)
Your physician has requested that you have en exercise stress myoview. For further information please visit [email or web address] Please follow instruction sheet, as given.   STOP SIMVASTATIN  START PRAVASTATIN 40MG  ONCE DAILY AT BEDTIME  Your physician recommends that you return for lab work in: 6 WEEKS FASTING

## 2010-11-09 NOTE — Progress Notes (Signed)
HPI: 60 year old male with no prior cardiac history for evaluation of dyspnea, hypertension and hyperlipidemia. The patient has noticed more dyspnea on exertion recently. This occurs with climbing stairs and more moderate activities. It is relieved with rest. There is no orthopnea or PND but there is mild pedal edema. He occasionally has some soreness in his chest but no exertional chest pain. Because of the above we were asked to further evaluate.  Current Outpatient Prescriptions  Medication Sig Dispense Refill  . alfuzosin (UROXATRAL) 10 MG 24 hr tablet Take 10 mg by mouth daily.        Marland Kitchen amLODipine (NORVASC) 10 MG tablet Take 10 mg by mouth daily.        Marland Kitchen aspirin 81 MG tablet Take 81 mg by mouth daily.        . fexofenadine (ALLEGRA) 180 MG tablet Take 180 mg by mouth daily.        . fluocinonide-emollient (LIDEX-E) 0.05 % cream as directed.      . furosemide (LASIX) 40 MG tablet Take 40 mg by mouth daily.        Marland Kitchen glimepiride (AMARYL) 4 MG tablet TAKE 1 TABLET BY MOUTH TWICE A DAY  150 tablet  5  . insulin glargine (LANTUS SOLOSTAR) 100 UNIT/ML injection Inject 70 Units into the skin at bedtime.        . Insulin Pen Needle 31G X 8 MM MISC Use once daily as directed  100 each  3  . lisinopril (PRINIVIL,ZESTRIL) 40 MG tablet Take 40 mg by mouth daily.        . naproxen sodium (ALEVE) 220 MG tablet As needed for pain       . Pioglitazone HCl-Metformin HCl (ACTOPLUS MET XR) 15-1000 MG TB24 1 tablet twice a day  30 tablet  0  . saxagliptin HCl (ONGLYZA) 5 MG TABS tablet Take 5 mg by mouth daily.        . simvastatin (ZOCOR) 40 MG tablet Take 40 mg by mouth at bedtime.        . Testosterone (ANDROGEL PUMP) 1.25 GM/ACT (1%) GEL 2 pumps per day, 5.0 gms once a day       . vardenafil (LEVITRA) 20 MG tablet Take 20 mg by mouth daily.        Marland Kitchen DISCONTD: sildenafil (VIAGRA) 100 MG tablet Take 100 mg by mouth daily as needed.          Allergies  Allergen Reactions  . Amoxicillin     REACTION:  unspecified  . Levofloxacin     REACTION: unspecified  . Penicillins     Past Medical History  Diagnosis Date  . OSA (obstructive sleep apnea)   . Hypertension   . Hyperlipidemia   . Diabetes mellitus, type 2   . Exogenous obesity   . Diverticulosis of colon   . Testicular hypofunction   . Erectile dysfunction   . Cervical disc disease   . Bursitis of left shoulder   . History of anemia     Past Surgical History  Procedure Date  . Posterior laminectomy / decompression cervical spine     c5-6 fusion 12/08 Dr. Blanche East  . S/p cervical disc surgery and fusion 10/09    Dr. Blanche East    History   Social History  . Marital Status: Married    Spouse Name: N/A    Number of Children: 1  . Years of Education: N/A   Occupational History  . supervisor of warehouse for [employer]   . 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 Smoking status: Never Smoker   . Smokeless tobacco: Not on file  . Alcohol Use: Yes     3 ounces per day  . Drug Use: Not on file  . Sexually Active: Not on file   Other Topics Concern  . Not on file   Social History Narrative  . No narrative on file    Family History  Problem Relation Age of Onset  . Heart disease Father     Died of MI at age 70  . Breast cancer Mother     ROS: no fevers or chills, productive cough, hemoptysis, dysphasia, odynophagia, melena, hematochezia, dysuria, hematuria, rash, seizure activity, orthopnea, PND, pedal edema, claudication. Remaining systems are negative.  Physical Exam: General:  Well developed/obese in NAD Skin warm/dry Patient not depressed No peripheral clubbing Back-normal HEENT-normal/normal eyelids Neck supple/normal carotid upstroke bilaterally; no bruits; no JVD; no thyromegaly chest - CTA/ normal expansion CV - RRR/normal S1 and S2; no murmurs, rubs or gallops;  PMI nondisplaced Abdomen -NT/ND, no HSM, no mass, + bowel sounds, no bruit 2+ femoral pulses, no bruits Ext-1+  edema, no chords, 2+ DP Neuro-grossly nonfocal  ECG Normal sinus rhythm at a rate of 82. No ST changes.

## 2010-11-09 NOTE — Assessment & Plan Note (Signed)
Possibly related to deconditioning, OHS and OSA. However patient also has 20 years of diabetes mellitus. He also has hypertension and hyperlipidemia. He is also contemplating initiating an exercise program. Schedule stress Myoview to exclude ischemia and to quantify LV function.

## 2010-11-09 NOTE — Assessment & Plan Note (Addendum)
Patient counseled on weight loss. 

## 2010-11-09 NOTE — Assessment & Plan Note (Signed)
Given that patient is using amlodipine 10 mg daily for blood pressure I will discontinue his Zocor and begin Pravachol 40 mg daily. Check lipids and liver in 6 weeks.

## 2010-11-09 NOTE — Assessment & Plan Note (Signed)
Blood pressure elevated he states typically normal. He will follow and his medications can be adjusted as.

## 2010-11-10 ENCOUNTER — Encounter: Payer: Self-pay | Admitting: *Deleted

## 2010-11-23 ENCOUNTER — Ambulatory Visit (HOSPITAL_COMMUNITY): Payer: Managed Care, Other (non HMO) | Attending: Cardiology | Admitting: Radiology

## 2010-11-23 VITALS — Ht 69.0 in | Wt 286.0 lb

## 2010-11-23 DIAGNOSIS — R0789 Other chest pain: Secondary | ICD-10-CM

## 2010-11-23 DIAGNOSIS — R079 Chest pain, unspecified: Secondary | ICD-10-CM

## 2010-11-23 DIAGNOSIS — E119 Type 2 diabetes mellitus without complications: Secondary | ICD-10-CM

## 2010-11-23 DIAGNOSIS — I4949 Other premature depolarization: Secondary | ICD-10-CM

## 2010-11-23 DIAGNOSIS — R0602 Shortness of breath: Secondary | ICD-10-CM | POA: Insufficient documentation

## 2010-11-23 MED ORDER — TECHNETIUM TC 99M TETROFOSMIN IV KIT
33.0000 | PACK | Freq: Once | INTRAVENOUS | Status: AC | PRN
  Administered 2010-11-23: 33 via INTRAVENOUS

## 2010-11-23 MED ORDER — TECHNETIUM TC 99M TETROFOSMIN IV KIT
11.0000 | PACK | Freq: Once | INTRAVENOUS | Status: AC | PRN
  Administered 2010-11-23: 11 via INTRAVENOUS

## 2010-11-23 NOTE — Progress Notes (Deleted)

## 2010-11-23 NOTE — Progress Notes (Signed)
[facility] SITE 3 NUCLEAR MED [address] [place] [address] [phone]  Cardiology Nuclear Med Study  [name] [name] is a 60 y.o. male [id number] Sep 02, 1950   Nuclear Med Background Indication for Stress Test:  Evaluation for Ischemia and pending exercise program History: '06 Myocardial Perfusion Study:EF 66% (-) ischemia Cardiac Risk Factors: Family History - CAD, History of Smoking, Hypertension, IDDM Type 2, Lipids and Obesity  Symptoms:  Chest Tightness, DOE, Fatigue, Palpitations and SOB   Nuclear Pre-Procedure Caffeine/Decaff Intake:  None NPO After: 7:30pm   Lungs:  clear IV 0.9% NS with Angio Cath:  24g  IV Site: L Hand  IV Started by:  Irean Hong, RN  Chest Size (in):  52 Cup Size: n/a  Height: 5\' 9"  (1.753 m)  Weight:  286 lb (129.729 kg)  BMI:  Body mass index is 42.23 kg/(m^2). Tech Comments:  This patient walked on the treadmill and became very SOB with an O2 sat of 87% and a hypertensive response. B.Crenshaw consulted and he is going to see the patient on 11/23/10 @ 10:45.    Nuclear Med Study 1 or 2 day study: 1 day  Stress Test Type:  Stress  Reading MD: Dietrich Pates, MD  Order Authorizing Provider:  B.Crenshaw  Resting Radionuclide: Technetium 27m Tetrofosmin  Resting Radionuclide Dose: 11 mCi   Stress Radionuclide:  Technetium 59m Tetrofosmin  Stress Radionuclide Dose: 33 mCi           Stress Protocol Rest HR: 79 Stress HR: 150  Rest BP: 162/70 Stress BP: 223/38  Exercise Time (min): 6:01 METS: 7.0   Predicted Max HR: 160 bpm % Max HR: 93.75 bpm Rate Pressure Product: 52841   Dose of Adenosine (mg):  n/a Dose of Lexiscan: n/a mg  Dose of Atropine (mg): n/a Dose of Dobutamine: n/a mcg/kg/min (at max HR)  Stress Test Technologist: Milana Na, EMT-P  Nuclear Technologist:  Domenic Polite, CNMT     Rest Procedure:  Myocardial perfusion imaging was performed at rest 45 minutes following the intravenous administration  of Technetium 2m Tetrofosmin. Rest ECG: NSR  Stress Procedure:  The patient exercised for 6:01.  The patient stopped due to sob,farigue, light headed, and denied any chest pain.  There were no significant ST-T wave changes and rare pvcs/pac.  Technetium 43m Tetrofosmin was injected at peak exercise and myocardial perfusion imaging was performed after a brief delay. Stress ECG: No significant change from baseline ECG  QPS Raw Data Images: Soft tissue (diaphragm, bowel activity, subcutaneous fat) surround heart. Stress Images:  Normal homogeneous uptake in all areas of the myocardium. Rest Images:  Normal homogeneous uptake in all areas of the myocardium. Subtraction (SDS):  Normal Transient Ischemic Dilatation (Normal <1.22):  1.08 Lung/Heart Ratio (Normal <0.45):  .25  Quantitative Gated Spect Images QGS EDV:  144 ml QGS ESV:  49 ml QGS cine images:  NL LV Function; NL Wall Motion QGS EF: 60%  Impression Exercise Capacity:  Fair exercise capacity. BP Response:  Hypertensive blood pressure response. Clinical Symptoms:  No chest pain.  Very short of breath. ECG Impression:  No significant ST segment change suggestive of ischemia. Comparison with Prior Nuclear Study: No change from previuos report.}  Overall Impression:  Normal stress nuclear study.

## 2010-11-24 ENCOUNTER — Encounter: Payer: Self-pay | Admitting: Cardiology

## 2010-11-24 ENCOUNTER — Ambulatory Visit (INDEPENDENT_AMBULATORY_CARE_PROVIDER_SITE_OTHER): Payer: Managed Care, Other (non HMO) | Admitting: Cardiology

## 2010-11-24 DIAGNOSIS — M0820 Juvenile rheumatoid arthritis with systemic onset, unspecified site: Secondary | ICD-10-CM

## 2010-11-24 DIAGNOSIS — R0600 Dyspnea, unspecified: Secondary | ICD-10-CM

## 2010-11-24 DIAGNOSIS — M083 Juvenile rheumatoid polyarthritis (seronegative): Secondary | ICD-10-CM

## 2010-11-24 DIAGNOSIS — I10 Essential (primary) hypertension: Secondary | ICD-10-CM

## 2010-11-24 DIAGNOSIS — R0609 Other forms of dyspnea: Secondary | ICD-10-CM

## 2010-11-24 LAB — BRAIN NATRIURETIC PEPTIDE: Pro B Natriuretic peptide (BNP): 45 pg/mL (ref 0.0–100.0)

## 2010-11-24 LAB — D-DIMER, QUANTITATIVE: D-Dimer, Quant: 0.41 ug/mL-FEU (ref 0.00–0.48)

## 2010-11-24 MED ORDER — METOPROLOL SUCCINATE ER 25 MG PO TB24: 25.0000 mg | ORAL_TABLET | Freq: Every day | ORAL | Status: DC

## 2010-11-24 NOTE — Assessment & Plan Note (Signed)
Continue statin. 

## 2010-11-24 NOTE — Progress Notes (Signed)
HPI:60 year old male I saw him recently in July of 2012 with complaints of dyspnea. Myoview was performed yesterday and the perfusion was normal. There were no ST changes. EF 60. The patient did have dyspnea and his saturations decreased to 87%. Since then, he continues to have dyspnea on exertion. This occurs with more moderate activities and is relieved with rest. There is no orthopnea or PND. He has chronic pedal edema which is unchanged. He occasionally feels tightness in his chest. This occurs in the morning hours and improves with sitting up and beginning his daily activities. He does not have exertional chest pain.   Current Outpatient Prescriptions  Medication Sig Dispense Refill  . alfuzosin (UROXATRAL) 10 MG 24 hr tablet Take 10 mg by mouth daily.        Marland Kitchen amLODipine (NORVASC) 10 MG tablet Take 10 mg by mouth daily.        Marland Kitchen aspirin 81 MG tablet Take 81 mg by mouth daily.        . fexofenadine (ALLEGRA) 180 MG tablet Take 180 mg by mouth daily.        . fluocinonide-emollient (LIDEX-E) 0.05 % cream as directed.      . furosemide (LASIX) 40 MG tablet Take 40 mg by mouth daily.        Marland Kitchen glimepiride (AMARYL) 4 MG tablet TAKE 1 TABLET BY MOUTH TWICE A DAY  150 tablet  5  . insulin glargine (LANTUS SOLOSTAR) 100 UNIT/ML injection Inject 70 Units into the skin at bedtime.        . Insulin Pen Needle 31G X 8 MM MISC Use once daily as directed  100 each  3  . lisinopril (PRINIVIL,ZESTRIL) 40 MG tablet Take 40 mg by mouth daily.        . naproxen sodium (ALEVE) 220 MG tablet As needed for pain       . Pioglitazone HCl-Metformin HCl (ACTOPLUS MET XR) 15-1000 MG TB24 1 tablet twice a day  30 tablet  0  . pravastatin (PRAVACHOL) 40 MG tablet Take 1 tablet (40 mg total) by mouth every evening.  90 tablet  4  . saxagliptin HCl (ONGLYZA) 5 MG TABS tablet Take 5 mg by mouth daily.        . Testosterone (ANDROGEL PUMP) 1.25 GM/ACT (1%) GEL 2 pumps per day, 5.0 gms once a day       . vardenafil (LEVITRA)  20 MG tablet Take 20 mg by mouth daily.           Past Medical History  Diagnosis Date  . OSA (obstructive sleep apnea)   . Hypertension   . Hyperlipidemia   . Diabetes mellitus, type 2   . Exogenous obesity   . Diverticulosis of colon   . Testicular hypofunction   . Erectile dysfunction   . Cervical disc disease   . Bursitis of left shoulder   . History of anemia     Past Surgical History  Procedure Date  . Posterior laminectomy / decompression cervical spine     c5-6 fusion 12/08 Dr. Blanche East  . S/p cervical disc surgery and fusion 10/09    Dr. Blanche East    History   Social History  . Marital Status: Married    Spouse Name: N/A    Number of Children: 1  . Years of Education: N/A   Occupational History  . 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  Smoking status: Former Games developer  . Smokeless tobacco: Not on file  . Alcohol Use: Yes     3 ounces per day  . Drug Use: Not on file  . Sexually Active: Not on file   Other Topics Concern  . Not on file   Social History Narrative  . No narrative on file    ROS: no fevers or chills, productive cough, hemoptysis, dysphasia, odynophagia, melena, hematochezia, dysuria, hematuria, rash, seizure activity, orthopnea, PND, claudication. Remaining systems are negative.  Physical Exam: Well-developed well-nourished in no acute distress.  Skin is warm and dry.  HEENT is normal.  Neck is supple. No thyromegaly.  Chest is clear to auscultation with normal expansion.  Cardiovascular exam is regular rate and rhythm.  Abdominal exam nontender or distended. No masses palpated. Extremities show 1+ edema. neuro grossly intact

## 2010-11-24 NOTE — Assessment & Plan Note (Signed)
Blood pressure elevated. Add Toprol 25 mg daily. 

## 2010-11-24 NOTE — Progress Notes (Signed)
nuc med report routed to Dr.Crenshaw 11/24/10 [name] [name]  

## 2010-11-24 NOTE — Assessment & Plan Note (Addendum)
Etiology remains unclear. I discussed his stress test with him in detail today. His perfusion is normal and overall LV function is normal. No ST changes. However he did have dyspnea and his saturations decreased to 87%. We discussed the option of proceeding with right and left catheterization to exclude coronary disease and also to measure pulmonary pressures and pulmonary capillary wedge pressure. We also discussed echocardiogram to assess diastolic dysfunction. He would like a more conservative approach initially. I will therefore plan to proceed with the echocardiogram and a BNP. I have asked him to increase his Lasix to daily (he is taking only 3 times weekly). I will also check a d-dimer although I think pulmonary embolus is unlikely. We will consider cardiac catheterization in the future if his symptoms do not improve. There may be a component of obstructive sleep apnea and obesity hypoventilation syndrome. We will keep in mind 20 year history of diabetes mellitus and elevated risk of CAD.

## 2010-11-24 NOTE — Patient Instructions (Addendum)
Your physician recommends that you schedule a follow-up appointment in: 6 to 8 weeks with Dr. Jens Som. Your physician recommends that you return for lab work in:  D-Dimer, BNP today. Bmet lab in one week. 12/01/10. Your physician has requested that you have an echocardiogram. Echocardiography is a painless test that uses sound waves to create images of your heart. It provides your doctor with information about the size and shape of your heart and how well your heart's chambers and valves are working. This procedure takes approximately one hour. There are no restrictions for this procedure. Your physician has recommended you make the following change in your medication: Take Furosemide once a day, Toprol 25 mg take one tablet by mouth daily.

## 2010-11-25 ENCOUNTER — Telehealth: Payer: Self-pay | Admitting: Cardiology

## 2010-11-25 NOTE — Telephone Encounter (Signed)
Returning call back to christine 

## 2010-11-25 NOTE — Telephone Encounter (Signed)
PT AWARE OF LAB RESULTS./CY 

## 2010-11-30 ENCOUNTER — Other Ambulatory Visit: Payer: Self-pay | Admitting: *Deleted

## 2010-11-30 MED ORDER — GLIMEPIRIDE 4 MG PO TABS: 4.0000 mg | ORAL_TABLET | Freq: Two times a day (BID) | ORAL | Status: DC

## 2010-12-01 ENCOUNTER — Ambulatory Visit (HOSPITAL_COMMUNITY): Payer: Managed Care, Other (non HMO) | Attending: Cardiology | Admitting: Radiology

## 2010-12-01 ENCOUNTER — Other Ambulatory Visit (INDEPENDENT_AMBULATORY_CARE_PROVIDER_SITE_OTHER): Payer: Managed Care, Other (non HMO) | Admitting: *Deleted

## 2010-12-01 ENCOUNTER — Other Ambulatory Visit (HOSPITAL_COMMUNITY): Payer: Self-pay | Admitting: Cardiology

## 2010-12-01 DIAGNOSIS — R0989 Other specified symptoms and signs involving the circulatory and respiratory systems: Secondary | ICD-10-CM | POA: Insufficient documentation

## 2010-12-01 DIAGNOSIS — R0600 Dyspnea, unspecified: Secondary | ICD-10-CM

## 2010-12-01 DIAGNOSIS — E785 Hyperlipidemia, unspecified: Secondary | ICD-10-CM | POA: Insufficient documentation

## 2010-12-01 DIAGNOSIS — I079 Rheumatic tricuspid valve disease, unspecified: Secondary | ICD-10-CM | POA: Insufficient documentation

## 2010-12-01 DIAGNOSIS — E7800 Pure hypercholesterolemia, unspecified: Secondary | ICD-10-CM

## 2010-12-01 DIAGNOSIS — I10 Essential (primary) hypertension: Secondary | ICD-10-CM | POA: Insufficient documentation

## 2010-12-01 DIAGNOSIS — I059 Rheumatic mitral valve disease, unspecified: Secondary | ICD-10-CM | POA: Insufficient documentation

## 2010-12-01 DIAGNOSIS — R0609 Other forms of dyspnea: Secondary | ICD-10-CM | POA: Insufficient documentation

## 2010-12-01 LAB — BASIC METABOLIC PANEL
BUN: 20 mg/dL (ref 6–23)
CO2: 31 mEq/L (ref 19–32)
Chloride: 109 mEq/L (ref 96–112)
Creatinine, Ser: 1 mg/dL (ref 0.4–1.5)
Glucose, Bld: 53 mg/dL — ABNORMAL LOW (ref 70–99)
Potassium: 4.4 mEq/L (ref 3.5–5.1)

## 2010-12-01 LAB — LIPID PANEL
Cholesterol: 144 mg/dL (ref 0–200)
HDL: 49.3 mg/dL (ref >39.00)
LDL Cholesterol: 78 mg/dL (ref 0–99)
Triglycerides: 83 mg/dL (ref 0.0–149.0)
VLDL: 16.6 mg/dL (ref 0.0–40.0)

## 2010-12-01 LAB — HEPATIC FUNCTION PANEL
Alkaline Phosphatase: 75 U/L (ref 39–117)
Bilirubin, Direct: 0.1 mg/dL (ref 0.0–0.3)
Total Bilirubin: 0.5 mg/dL (ref 0.3–1.2)

## 2010-12-01 MED ORDER — PERFLUTREN PROTEIN A MICROSPH IV SUSP
3.0000 mL | Freq: Once | INTRAVENOUS | Status: AC
  Administered 2010-12-01: 2 mL via INTRAVENOUS

## 2010-12-02 ENCOUNTER — Encounter: Payer: Self-pay | Admitting: *Deleted

## 2010-12-17 ENCOUNTER — Other Ambulatory Visit: Payer: Self-pay | Admitting: *Deleted

## 2010-12-17 MED ORDER — SAXAGLIPTIN HCL 5 MG PO TABS: 5.0000 mg | ORAL_TABLET | Freq: Every day | ORAL | Status: DC

## 2010-12-23 ENCOUNTER — Other Ambulatory Visit: Payer: Managed Care, Other (non HMO) | Admitting: *Deleted

## 2010-12-28 ENCOUNTER — Other Ambulatory Visit (INDEPENDENT_AMBULATORY_CARE_PROVIDER_SITE_OTHER): Payer: Managed Care, Other (non HMO) | Admitting: *Deleted

## 2010-12-28 DIAGNOSIS — R0989 Other specified symptoms and signs involving the circulatory and respiratory systems: Secondary | ICD-10-CM

## 2011-01-20 ENCOUNTER — Ambulatory Visit (INDEPENDENT_AMBULATORY_CARE_PROVIDER_SITE_OTHER): Payer: Managed Care, Other (non HMO) | Admitting: *Deleted

## 2011-01-20 DIAGNOSIS — E785 Hyperlipidemia, unspecified: Secondary | ICD-10-CM

## 2011-01-20 LAB — LIPID PANEL
Cholesterol: 150 mg/dL (ref 0–200)
LDL Cholesterol: 85 mg/dL (ref 0–99)
Triglycerides: 69 mg/dL (ref 0.0–149.0)

## 2011-01-20 LAB — HEPATIC FUNCTION PANEL
ALT: 20 U/L (ref 0–53)
AST: 18 U/L (ref 0–37)
Total Bilirubin: 0.6 mg/dL (ref 0.3–1.2)
Total Protein: 7.3 g/dL (ref 6.0–8.3)

## 2011-01-21 ENCOUNTER — Encounter: Payer: Self-pay | Admitting: Cardiology

## 2011-01-21 ENCOUNTER — Ambulatory Visit (INDEPENDENT_AMBULATORY_CARE_PROVIDER_SITE_OTHER): Payer: Managed Care, Other (non HMO) | Admitting: Cardiology

## 2011-01-21 ENCOUNTER — Encounter: Payer: Self-pay | Admitting: *Deleted

## 2011-01-21 DIAGNOSIS — I10 Essential (primary) hypertension: Secondary | ICD-10-CM

## 2011-01-21 DIAGNOSIS — R0600 Dyspnea, unspecified: Secondary | ICD-10-CM

## 2011-01-21 DIAGNOSIS — E785 Hyperlipidemia, unspecified: Secondary | ICD-10-CM

## 2011-01-21 DIAGNOSIS — Z01810 Encounter for preprocedural cardiovascular examination: Secondary | ICD-10-CM

## 2011-01-21 DIAGNOSIS — R0989 Other specified symptoms and signs involving the circulatory and respiratory systems: Secondary | ICD-10-CM

## 2011-01-21 NOTE — Assessment & Plan Note (Signed)
Continue present blood pressure medications. 

## 2011-01-21 NOTE — Assessment & Plan Note (Signed)
Patient is complaining of myalgias. Discontinue Pravachol and follow. If his symptoms improve will try a different statin.

## 2011-01-21 NOTE — Patient Instructions (Addendum)
Your physician recommends that you schedule a follow-up appointment in: 6 WEEKS  .Your physician has requested that you have a cardiac catheterization. Cardiac catheterization is used to diagnose and/or treat various heart conditions. Doctors may recommend this procedure for a number of different reasons. The most common reason is to evaluate chest pain. Chest pain can be a symptom of coronary artery disease (CAD), and cardiac catheterization can show whether plaque is narrowing or blocking your heart's arteries. This procedure is also used to evaluate the valves, as well as measure the blood flow and oxygen levels in different parts of your heart. For further information please visit [email or web address] Please follow instruction sheet, as given.   Your physician recommends that you return for lab work ON Monday 02-01-11  A chest x-ray takes a picture of the organs and structures inside the chest, including the heart, lungs, and blood vessels. This test can show several things, including, whether the heart is enlarges; whether fluid is building up in the lungs; and whether pacemaker / defibrillator leads are still in place. AT [address] ON Monday 02-01-11  STOP PRAVACHOL

## 2011-01-21 NOTE — Assessment & Plan Note (Signed)
Patient continues to have significant dyspnea on exertion. This could be related to OHS/OSA but he also has 20 years of diabetes mellitus. I have recommended right and left heart catheterization. Note his saturations decreased with exercise at the time of his previous Myoview. This procedure would exclude coronary disease and help measure filling pressures and pulmonary pressures. The risks and benefits have been discussed and he agrees to proceed. Hold Lasix today prior to the procedure and the day of the procedure.

## 2011-01-21 NOTE — Progress Notes (Signed)
HPI:60 year old male I saw him recently in July of 2012 with complaints of dyspnea. Myoview was performed 7/12 and the perfusion was normal. There were no ST changes. EF 60. The patient did have dyspnea and his saturations decreased to 87%. When I last saw him we discussed cardiac catheterization. However he preferred conservative approach. Echocardiogram in July 2012 showed normal LV function, mild LAE and moderate left ventricular hypertrophy. BNP and d-dimer were normal. We did increase his Lasix previously. Since then, he continues to have dyspnea on exertion relieved with rest. There is no orthopnea, PND. He does have pedal edema. No chest pain, palpitations or syncope.   Current Outpatient Prescriptions  Medication Sig Dispense Refill  . alfuzosin (UROXATRAL) 10 MG 24 hr tablet Take 10 mg by mouth daily.        Marland Kitchen amLODipine (NORVASC) 10 MG tablet Take 10 mg by mouth daily.        Marland Kitchen aspirin 81 MG tablet Take 81 mg by mouth daily.        . fexofenadine (ALLEGRA) 180 MG tablet Take 180 mg by mouth daily. PRN      . fluocinonide-emollient (LIDEX-E) 0.05 % cream as directed.      . furosemide (LASIX) 40 MG tablet Take 40 mg by mouth daily.        Marland Kitchen glimepiride (AMARYL) 4 MG tablet Take 1 tablet (4 mg total) by mouth 2 (two) times daily.  180 tablet  1  . insulin glargine (LANTUS SOLOSTAR) 100 UNIT/ML injection Inject 70 Units into the skin at bedtime.        . Insulin Pen Needle 31G X 8 MM MISC Use once daily as directed  100 each  3  . lisinopril (PRINIVIL,ZESTRIL) 40 MG tablet Take 40 mg by mouth daily.        . metoprolol succinate (TOPROL XL) 25 MG 24 hr tablet Take 1 tablet (25 mg total) by mouth daily.  30 tablet  11  . naproxen sodium (ALEVE) 220 MG tablet As needed for pain       . Pioglitazone HCl-Metformin HCl (ACTOPLUS MET XR) 15-1000 MG TB24 1 tablet twice a day  30 tablet  0  . pravastatin (PRAVACHOL) 40 MG tablet Take 1 tablet (40 mg total) by mouth every evening.  90 tablet  4  .  saxagliptin HCl (ONGLYZA) 5 MG TABS tablet Take 1 tablet (5 mg total) by mouth daily.  30 tablet  6  . Testosterone (ANDROGEL PUMP) 1.25 GM/ACT (1%) GEL 2 pumps per day, 5.0 gms once a day      . vardenafil (LEVITRA) 20 MG tablet Take 20 mg by mouth daily. PRN         Past Medical History  Diagnosis Date  . OSA (obstructive sleep apnea)   . Hypertension   . Hyperlipidemia   . Diabetes mellitus, type 2   . Exogenous obesity   . Diverticulosis of colon   . Testicular hypofunction   . Erectile dysfunction   . Cervical disc disease   . Bursitis of left shoulder   . History of anemia     Past Surgical History  Procedure Date  . Posterior laminectomy / decompression cervical spine     c5-6 fusion 12/08 Dr. Blanche East  . S/p cervical disc surgery and fusion 10/09    Dr. Blanche East    History   Social History  . Marital Status: Married    Spouse Name: N/A    Number of Children: 1  .  Years of Education: N/A   Occupational History  . 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 Smoking status: Former Smoker    Quit date: 01/20/1981  . Smokeless tobacco: Not on file  . Alcohol Use: Yes     3 ounces per day  . Drug Use: Not on file  . Sexually Active: Not on file   Other Topics Concern  . Not on file   Social History Narrative  . No narrative on file    ROS: he complains of diffuse myalgias and also pain in his right abdomen at site of previous insulin injections but no fevers or chills, productive cough, hemoptysis, dysphasia, odynophagia, melena, hematochezia, dysuria, hematuria, rash, seizure activity, orthopnea, PND, claudication. Remaining systems are negative.  Physical Exam: Well-developed obese in no acute distress.  Skin is warm and dry.  HEENT is normal.  Neck is supple. No thyromegaly.  Chest is clear to auscultation with normal expansion.  Cardiovascular exam is regular rate and rhythm.  Abdominal exam  nontender or distended. No masses palpated. Extremities show 1+ edema. neuro grossly intact

## 2011-02-01 ENCOUNTER — Other Ambulatory Visit: Payer: Self-pay | Admitting: *Deleted

## 2011-02-01 ENCOUNTER — Other Ambulatory Visit (INDEPENDENT_AMBULATORY_CARE_PROVIDER_SITE_OTHER): Payer: Managed Care, Other (non HMO)

## 2011-02-01 ENCOUNTER — Ambulatory Visit (INDEPENDENT_AMBULATORY_CARE_PROVIDER_SITE_OTHER)
Admission: RE | Admit: 2011-02-01 | Discharge: 2011-02-01 | Disposition: A | Discharge status: Home / Self Care | Payer: Managed Care, Other (non HMO) | Source: Ambulatory Visit | Attending: Cardiology | Admitting: Cardiology

## 2011-02-01 DIAGNOSIS — Z7901 Long term (current) use of anticoagulants: Secondary | ICD-10-CM

## 2011-02-01 DIAGNOSIS — E785 Hyperlipidemia, unspecified: Secondary | ICD-10-CM

## 2011-02-01 DIAGNOSIS — R0989 Other specified symptoms and signs involving the circulatory and respiratory systems: Secondary | ICD-10-CM

## 2011-02-01 DIAGNOSIS — R0609 Other forms of dyspnea: Secondary | ICD-10-CM

## 2011-02-01 DIAGNOSIS — R0600 Dyspnea, unspecified: Secondary | ICD-10-CM

## 2011-02-01 DIAGNOSIS — I10 Essential (primary) hypertension: Secondary | ICD-10-CM

## 2011-02-01 DIAGNOSIS — Z01810 Encounter for preprocedural cardiovascular examination: Secondary | ICD-10-CM

## 2011-02-01 LAB — BASIC METABOLIC PANEL
BUN: 26 mg/dL — ABNORMAL HIGH (ref 6–23)
CO2: 29 mEq/L (ref 19–32)
Calcium: 8.8 mg/dL (ref 8.4–10.5)
Creatinine, Ser: 0.9 mg/dL (ref 0.4–1.5)
Glucose, Bld: 43 mg/dL — CL (ref 70–99)

## 2011-02-01 LAB — CBC WITH DIFFERENTIAL/PLATELET
Basophils Absolute: 0 10*3/uL (ref 0.0–0.1)
Eosinophils Absolute: 0.2 10*3/uL (ref 0.0–0.7)
Lymphocytes Relative: 22.1 % (ref 12.0–46.0)
MCHC: 33.1 g/dL (ref 30.0–36.0)
Monocytes Absolute: 0.5 10*3/uL (ref 0.1–1.0)
Neutrophils Relative %: 67.2 % (ref 43.0–77.0)
RDW: 16 % — ABNORMAL HIGH (ref 11.5–14.6)

## 2011-02-02 ENCOUNTER — Other Ambulatory Visit: Payer: Self-pay | Admitting: Cardiology

## 2011-02-02 LAB — PROTIME-INR: INR: 1.08 (ref <1.50)

## 2011-02-03 ENCOUNTER — Inpatient Hospital Stay (HOSPITAL_COMMUNITY)
Admission: RE | Admit: 2011-02-03 | Discharge: 2011-02-03 | Disposition: A | Discharge status: Home / Self Care | Payer: Managed Care, Other (non HMO) | Source: Ambulatory Visit | Attending: Cardiology | Admitting: Cardiology

## 2011-02-03 DIAGNOSIS — I2510 Atherosclerotic heart disease of native coronary artery without angina pectoris: Secondary | ICD-10-CM | POA: Insufficient documentation

## 2011-02-03 DIAGNOSIS — E119 Type 2 diabetes mellitus without complications: Secondary | ICD-10-CM | POA: Insufficient documentation

## 2011-02-03 DIAGNOSIS — R0989 Other specified symptoms and signs involving the circulatory and respiratory systems: Secondary | ICD-10-CM | POA: Insufficient documentation

## 2011-02-03 DIAGNOSIS — R0602 Shortness of breath: Secondary | ICD-10-CM

## 2011-02-03 DIAGNOSIS — R0609 Other forms of dyspnea: Secondary | ICD-10-CM | POA: Insufficient documentation

## 2011-02-03 DIAGNOSIS — R0789 Other chest pain: Secondary | ICD-10-CM | POA: Insufficient documentation

## 2011-02-03 LAB — POCT I-STAT GLUCOSE
Glucose, Bld: 93 mg/dL (ref 70–99)
Operator id: 221371

## 2011-02-03 LAB — POCT I-STAT 3, VENOUS BLOOD GAS (G3P V)
Acid-base deficit: 1 mmol/L (ref 0.0–2.0)
pH, Ven: 7.338 — ABNORMAL HIGH (ref 7.250–7.300)

## 2011-02-03 LAB — POCT I-STAT 3, ART BLOOD GAS (G3+)
Acid-Base Excess: 1 mmol/L (ref 0.0–2.0)
pO2, Arterial: 75 mmHg — ABNORMAL LOW (ref 80.0–100.0)

## 2011-02-04 ENCOUNTER — Encounter: Payer: Self-pay | Admitting: *Deleted

## 2011-02-08 LAB — BASIC METABOLIC PANEL
Chloride: 105
GFR calc Af Amer: >60
GFR calc non Af Amer: >60
Potassium: 4.5
Sodium: 142

## 2011-02-08 LAB — CBC
HCT: 39.6
MCV: 79.9
RBC: 4.95
WBC: 8.2

## 2011-02-08 LAB — URINALYSIS, ROUTINE W REFLEX MICROSCOPIC
Glucose, UA: 100 — AB
Ketones, ur: NEGATIVE
Leukocytes, UA: NEGATIVE
Nitrite: NEGATIVE
Protein, ur: 30 — AB
pH: 8

## 2011-02-08 LAB — GLUCOSE, CAPILLARY
Glucose-Capillary: 114 — ABNORMAL HIGH
Glucose-Capillary: 138 — ABNORMAL HIGH
Glucose-Capillary: 164 — ABNORMAL HIGH
Glucose-Capillary: 176 — ABNORMAL HIGH
Glucose-Capillary: 182 — ABNORMAL HIGH
Glucose-Capillary: 204 — ABNORMAL HIGH
Glucose-Capillary: 84
Glucose-Capillary: 93

## 2011-02-08 LAB — URINE MICROSCOPIC-ADD ON

## 2011-02-08 LAB — ABO/RH: ABO/RH(D): O NEG

## 2011-02-08 LAB — APTT: aPTT: 29

## 2011-02-08 LAB — TYPE AND SCREEN

## 2011-02-15 LAB — CBC
HCT: 38 — ABNORMAL LOW
MCV: 80
Platelets: 397
RDW: 15.4

## 2011-02-15 LAB — BASIC METABOLIC PANEL
CO2: 28
Calcium: 9.5
Creatinine, Ser: 0.94
GFR calc Af Amer: >60
GFR calc non Af Amer: >60
Sodium: 137

## 2011-02-15 LAB — URINALYSIS, ROUTINE W REFLEX MICROSCOPIC
Bilirubin Urine: NEGATIVE
Glucose, UA: NEGATIVE
Hgb urine dipstick: NEGATIVE
Ketones, ur: NEGATIVE
pH: 5

## 2011-02-15 LAB — APTT: aPTT: 30

## 2011-02-16 LAB — DIFFERENTIAL
Basophils Absolute: 0
Basophils Absolute: 0.1
Basophils Relative: 0
Basophils Relative: 1
Eosinophils Relative: 3
Lymphocytes Relative: 10 — ABNORMAL LOW
Monocytes Absolute: 0.3
Monocytes Absolute: 0.4
Neutro Abs: 7.7
Neutrophils Relative %: 86 — ABNORMAL HIGH

## 2011-02-16 LAB — CBC
HCT: 39.8
HCT: 40.5
Hemoglobin: 13.5
Hemoglobin: 13.7
MCHC: 34.6
MCV: 78
MCV: 79.8
Platelets: 356
RDW: 15.1
RDW: 15.4
WBC: 8.9

## 2011-02-16 LAB — COMPREHENSIVE METABOLIC PANEL
Albumin: 4
Alkaline Phosphatase: 68
BUN: 18
Chloride: 101
Creatinine, Ser: 0.93
GFR calc non Af Amer: >60
Glucose, Bld: 235 — ABNORMAL HIGH
Total Bilirubin: 1.1

## 2011-02-16 LAB — BASIC METABOLIC PANEL
BUN: 24 — ABNORMAL HIGH
Creatinine, Ser: 0.92
GFR calc non Af Amer: >60
Glucose, Bld: 129 — ABNORMAL HIGH

## 2011-02-16 LAB — POCT CARDIAC MARKERS: Operator id: 4708

## 2011-02-16 LAB — LIPASE, BLOOD: Lipase: 17

## 2011-02-22 ENCOUNTER — Ambulatory Visit (INDEPENDENT_AMBULATORY_CARE_PROVIDER_SITE_OTHER): Payer: Managed Care, Other (non HMO) | Admitting: Cardiology

## 2011-02-22 ENCOUNTER — Encounter: Payer: Self-pay | Admitting: Cardiology

## 2011-02-22 DIAGNOSIS — I10 Essential (primary) hypertension: Secondary | ICD-10-CM

## 2011-02-22 DIAGNOSIS — E785 Hyperlipidemia, unspecified: Secondary | ICD-10-CM

## 2011-02-22 DIAGNOSIS — R0989 Other specified symptoms and signs involving the circulatory and respiratory systems: Secondary | ICD-10-CM

## 2011-02-22 DIAGNOSIS — R0600 Dyspnea, unspecified: Secondary | ICD-10-CM

## 2011-02-22 NOTE — Assessment & Plan Note (Signed)
Blood pressure controlled. Continue present medications. 

## 2011-02-22 NOTE — Progress Notes (Signed)
HPI: 60 year old male I saw him recently in July of 2012 with complaints of dyspnea. Myoview was performed 7/12 and the perfusion was normal. There were no ST changes. EF 60. The patient did have dyspnea and his saturations decreased to 87%. Echocardiogram in July 2012 showed normal LV function, mild LAE and moderate left ventricular hypertrophy. BNP and d-dimer were normal. Patients dyspnea persisted and cath was therefore performed in Sept of 2012 and revealed LVEDP 22, EF 65 and no obstructive CAD. Since then, he continues to have dyspnea on exertion relieved with rest. No orthopnea or PND. Chronic pedal edema. No chest pain. He did have myalgias with Pravachol.   Current Outpatient Prescriptions  Medication Sig Dispense Refill  . alfuzosin (UROXATRAL) 10 MG 24 hr tablet Take 10 mg by mouth daily.        Marland Kitchen amLODipine (NORVASC) 10 MG tablet Take 10 mg by mouth daily.        Marland Kitchen aspirin 81 MG tablet Take 81 mg by mouth daily.        . fexofenadine (ALLEGRA) 180 MG tablet Take 180 mg by mouth daily. PRN      . fluocinonide-emollient (LIDEX-E) 0.05 % cream as directed.      . furosemide (LASIX) 40 MG tablet Take 40 mg by mouth daily.        Marland Kitchen glimepiride (AMARYL) 4 MG tablet Take 1 tablet (4 mg total) by mouth 2 (two) times daily.  180 tablet  1  . insulin glargine (LANTUS SOLOSTAR) 100 UNIT/ML injection Inject 70 Units into the skin every morning.       . Insulin Pen Needle 31G X 8 MM MISC Use once daily as directed  100 each  3  . lisinopril (PRINIVIL,ZESTRIL) 40 MG tablet Take 40 mg by mouth daily.        . metoprolol succinate (TOPROL XL) 25 MG 24 hr tablet Take 1 tablet (25 mg total) by mouth daily.  30 tablet  11  . naproxen sodium (ALEVE) 220 MG tablet As needed for pain       . Pioglitazone HCl-Metformin HCl (ACTOPLUS MET XR) 15-1000 MG TB24 1 tablet twice a day  30 tablet  0  . saxagliptin HCl (ONGLYZA) 5 MG TABS tablet Take 1 tablet (5 mg total) by mouth daily.  30 tablet  6  . Testosterone  (ANDROGEL PUMP) 1.25 GM/ACT (1%) GEL 2 pumps per day, 5.0 gms once a day      . vardenafil (LEVITRA) 20 MG tablet Take 20 mg by mouth daily. PRN         Past Medical History  Diagnosis Date  . OSA (obstructive sleep apnea)   . Hypertension   . Hyperlipidemia   . Diabetes mellitus, type 2   . Exogenous obesity   . Diverticulosis of colon   . Testicular hypofunction   . Erectile dysfunction   . Cervical disc disease   . Bursitis of left shoulder   . History of anemia     Past Surgical History  Procedure Date  . Posterior laminectomy / decompression cervical spine     c5-6 fusion 12/08 Dr. Blanche East  . S/p cervical disc surgery and fusion 10/09    Dr. Blanche East    History   Social History  . Marital Status: Married    Spouse Name: N/A    Number of Children: 1  . Years of Education: N/A   Occupational History  . 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 Smoking status: Former Smoker    Quit date: 01/20/1981  . Smokeless tobacco: Not on file  . Alcohol Use: Yes     3 ounces per day  . Drug Use: Not on file  . Sexually Active: Not on file   Other Topics Concern  . Not on file   Social History Narrative  . No narrative on file    ROS: no fevers or chills, productive cough, hemoptysis, dysphasia, odynophagia, melena, hematochezia, dysuria, hematuria, rash, seizure activity, orthopnea, PND,  claudication. Remaining systems are negative.  Physical Exam: Well-developed obese in no acute distress.  Skin is warm and dry.  HEENT is normal.  Neck is supple. No thyromegaly.  Chest is clear to auscultation with normal expansion.  Cardiovascular exam is regular rate and rhythm.  Abdominal exam nontender or distended. No masses palpated. Extremities show 1+ edema. neuro grossly intact

## 2011-02-22 NOTE — Assessment & Plan Note (Signed)
Patient did not tolerate Pravachol. I have asked him to followup with his primary care physician for consideration of a different statin such as Crestor or Lipitor.

## 2011-02-22 NOTE — Assessment & Plan Note (Signed)
Etiology unclear.his catheterization showed no obstructive coronary disease and normal LV function. His left ventricular end-diastolic pressure was mildly elevated at 22. Continue present dose of Lasix. I think there may be a significant component of obesity hypoventilation syndrome/obstructive sleep apnea as well as deconditioning. He will followup with pulmonary for adjustment of his CPAP. I have discussed the importance of diet, exercise and weight loss.

## 2011-02-23 NOTE — Cardiovascular Report (Signed)
NAME:  [name] [name], [name] [name] NO.:  [id number]  MEDICAL RECORD NO.:  [id number]  LOCATION:  CATH                         FACILITY:  [facility]  PHYSICIAN:  Lorine Bears, MD     DATE OF BIRTH:  1950/06/13  DATE OF PROCEDURE:  02/03/2011 DATE OF DISCHARGE:                           CARDIAC CATHETERIZATION   REFERRING CARDIOLOGIST:  Madolyn Frieze. Jens Som, MD, [facility]  PROCEDURES PERFORMED: 1. Right heart catheterization. 2. Left heart catheterization 3. Coronary angiography. 4. Left ventricular angiography.  INDICATIONS AND CLINICAL HISTORY:  This is a 60 year old gentleman with prolonged history of type 2 diabetes who presented with symptoms of exertional dyspnea with minimal activities which has been progressive and occasional atypical chest pain.  Due to his risk factors and continued symptoms, right and left cardiac catheterization were recommended for a definitive diagnosis.  Risks, benefits, and alternatives were discussed with the patient.  STUDY DETAILS:  A standard informed consent was obtained.  The right groin area was prepped in a sterile fashion.  It was anesthetized with 1% lidocaine.  A 7-French sheath was placed in the right femoral vein. A 4-French sheath was placed in the right femoral artery.  Right heart catheterization was performed with a Swan-Ganz catheter.  Cardiac output was calculated by the Fick method.  Left ventricular angiography was performed with a pigtail catheter.  Coronary angiography was performed with a JL-4 and 3-D RC catheters.  All catheter exchanges were done over the wire.  The patient tolerated the procedure well with no immediate complications.  HEMODYNAMIC FINDINGS:  Right atrial pressure is 8 mmHg, RV pressure 38/7 mmHg, pulmonary capillary wedge pressure is 15 mmHg, PA pressure is 37/15 with a mean pressure of 26 mmHg.  Left ventricular pressure is 138/13 with a left ventricular end-diastolic pressure of 22 mmHg. Aortic  pressure is 140/70 with a mean pressure of 100 mmHg.  No significant gradient was noted across the aortic valve.  Calculated cardiac output by Fick method is 6.9 with a cardiac index of 2.9. Femoral artery sat is 95% and PA sat is 71%.  Calculated pulmonary vascular resistance is 1.3 Woods units.  LEFT VENTRICULAR ANGIOGRAPHY:  This showed normal LV systolic function and wall motion with an estimated ejection fraction of 65%.  LEFT CORONARY ANGIOGRAPHY: 1. Left main coronary artery:  The vessel is normal in size and free     of significant disease. 2. Left anterior descending artery:  The vessel is normal in size with     minor irregularities.  There is 20% discrete stenosis at the second     diagonal with no other evidence of obstructive disease.  First     diagonal is normal in size and free of significant disease.  The     second diagonal is also normal in size and free of significant     disease.  Third diagonal is absent. 3. Left circumflex artery:  The vessel is normal in size and     nondominant.  It is overall normal without significant disease.     There is a high OM-1 which functions as a ramus and free of     significant disease.  OM-2 and OM-3 are normal  in size. 4. Right coronary artery:  The vessel is large and dominant.  There is     10-20% tubular stenosis in the proximal segment, but otherwise no     evidence of obstructive disease.  The PDA is large in size.  There     are two main posterolateral branches and both of them are free of     significant disease.  STUDY CONCLUSIONS: 1. Minor nonobstructive coronary artery disease. 2. Normal LV systolic function. 3. Mildly elevated filling pressures without evidence of significant     pulmonary hypertension.  RECOMMENDATIONS:  Medical therapy is recommended.     Lorine Bears, MD     MA/MEDQ  D:  02/03/2011  T:  02/03/2011  Job:  [id number]  Electronically Signed by Lorine Bears MD on 02/23/2011 03:53:08 PM

## 2011-03-11 ENCOUNTER — Encounter: Payer: Self-pay | Admitting: Cardiology

## 2011-04-14 ENCOUNTER — Ambulatory Visit: Payer: Managed Care, Other (non HMO) | Admitting: Pulmonary Disease

## 2011-05-17 ENCOUNTER — Telehealth: Payer: Self-pay | Admitting: Pulmonary Disease

## 2011-05-17 DIAGNOSIS — F419 Anxiety disorder, unspecified: Secondary | ICD-10-CM

## 2011-05-17 DIAGNOSIS — D649 Anemia, unspecified: Secondary | ICD-10-CM

## 2011-05-17 DIAGNOSIS — I10 Essential (primary) hypertension: Secondary | ICD-10-CM

## 2011-05-17 DIAGNOSIS — E785 Hyperlipidemia, unspecified: Secondary | ICD-10-CM

## 2011-05-17 DIAGNOSIS — N529 Male erectile dysfunction, unspecified: Secondary | ICD-10-CM

## 2011-05-17 DIAGNOSIS — E119 Type 2 diabetes mellitus without complications: Secondary | ICD-10-CM

## 2011-05-17 DIAGNOSIS — K5730 Diverticulosis of large intestine without perforation or abscess without bleeding: Secondary | ICD-10-CM

## 2011-05-17 DIAGNOSIS — E291 Testicular hypofunction: Secondary | ICD-10-CM

## 2011-05-17 MED ORDER — SAXAGLIPTIN HCL 5 MG PO TABS: 5.0000 mg | ORAL_TABLET | Freq: Every day | ORAL | Status: DC

## 2011-05-17 NOTE — Telephone Encounter (Signed)
[facility]  [address] [place]   REQUESTING REFILL ON  ONGLYZA 5MG  QD #90  LAST FILL 04/05/2011 DR Kriste Basque is this ok to fill  Allergies  Allergen Reactions  . Amoxicillin     REACTION: unspecified  . Levofloxacin     REACTION: unspecified  . Penicillins

## 2011-05-17 NOTE — Telephone Encounter (Signed)
Called and spoke with pt and he is aware of labs in computer for him.

## 2011-05-17 NOTE — Telephone Encounter (Signed)
Please advise if labs are needed prior to his appt on 1/16 thanks

## 2011-05-26 ENCOUNTER — Encounter: Payer: Managed Care, Other (non HMO) | Admitting: Pulmonary Disease

## 2011-06-15 ENCOUNTER — Encounter: Payer: Self-pay | Admitting: Gastroenterology

## 2011-06-29 ENCOUNTER — Other Ambulatory Visit (INDEPENDENT_AMBULATORY_CARE_PROVIDER_SITE_OTHER): Payer: Managed Care, Other (non HMO)

## 2011-06-29 DIAGNOSIS — E119 Type 2 diabetes mellitus without complications: Secondary | ICD-10-CM

## 2011-06-29 DIAGNOSIS — E785 Hyperlipidemia, unspecified: Secondary | ICD-10-CM

## 2011-06-29 DIAGNOSIS — E291 Testicular hypofunction: Secondary | ICD-10-CM

## 2011-06-29 DIAGNOSIS — F411 Generalized anxiety disorder: Secondary | ICD-10-CM

## 2011-06-29 DIAGNOSIS — F419 Anxiety disorder, unspecified: Secondary | ICD-10-CM

## 2011-06-29 DIAGNOSIS — K5730 Diverticulosis of large intestine without perforation or abscess without bleeding: Secondary | ICD-10-CM

## 2011-06-29 DIAGNOSIS — I10 Essential (primary) hypertension: Secondary | ICD-10-CM

## 2011-06-29 LAB — LIPID PANEL
Cholesterol: 197 mg/dL (ref 0–200)
HDL: 41.5 mg/dL (ref >39.00)
Triglycerides: 174 mg/dL — ABNORMAL HIGH (ref 0.0–149.0)

## 2011-06-29 LAB — CBC WITH DIFFERENTIAL/PLATELET
Basophils Absolute: 0 10*3/uL (ref 0.0–0.1)
Basophils Relative: 0.4 % (ref 0.0–3.0)
Eosinophils Absolute: 0.3 10*3/uL (ref 0.0–0.7)
Lymphocytes Relative: 22.9 % (ref 12.0–46.0)
MCHC: 33.4 g/dL (ref 30.0–36.0)
MCV: 80.3 fl (ref 78.0–100.0)
Monocytes Absolute: 0.6 10*3/uL (ref 0.1–1.0)
Neutrophils Relative %: 62.4 % (ref 43.0–77.0)
RBC: 5.08 Mil/uL (ref 4.22–5.81)
RDW: 15.4 % — ABNORMAL HIGH (ref 11.5–14.6)

## 2011-06-29 LAB — BASIC METABOLIC PANEL
BUN: 26 mg/dL — ABNORMAL HIGH (ref 6–23)
CO2: 29 mEq/L (ref 19–32)
Calcium: 9.2 mg/dL (ref 8.4–10.5)
Chloride: 101 mEq/L (ref 96–112)
Creatinine, Ser: 0.9 mg/dL (ref 0.4–1.5)
Glucose, Bld: 97 mg/dL (ref 70–99)

## 2011-06-29 LAB — HEPATIC FUNCTION PANEL
ALT: 18 U/L (ref 0–53)
Albumin: 3.7 g/dL (ref 3.5–5.2)
Alkaline Phosphatase: 79 U/L (ref 39–117)
Bilirubin, Direct: 0.1 mg/dL (ref 0.0–0.3)
Total Protein: 6.7 g/dL (ref 6.0–8.3)

## 2011-06-29 LAB — HEMOGLOBIN A1C: Hgb A1c MFr Bld: 7.1 % — ABNORMAL HIGH (ref 4.6–6.5)

## 2011-06-29 LAB — PSA: PSA: 0.67 ng/mL (ref 0.10–4.00)

## 2011-07-07 ENCOUNTER — Encounter: Payer: Self-pay | Admitting: Pulmonary Disease

## 2011-07-07 ENCOUNTER — Ambulatory Visit (INDEPENDENT_AMBULATORY_CARE_PROVIDER_SITE_OTHER): Payer: Managed Care, Other (non HMO) | Admitting: Pulmonary Disease

## 2011-07-07 VITALS — BP 144/78 | HR 81 | Temp 98.8°F | Ht 68.0 in | Wt 284.0 lb

## 2011-07-07 DIAGNOSIS — E785 Hyperlipidemia, unspecified: Secondary | ICD-10-CM

## 2011-07-07 DIAGNOSIS — E119 Type 2 diabetes mellitus without complications: Secondary | ICD-10-CM

## 2011-07-07 DIAGNOSIS — K5730 Diverticulosis of large intestine without perforation or abscess without bleeding: Secondary | ICD-10-CM

## 2011-07-07 DIAGNOSIS — E291 Testicular hypofunction: Secondary | ICD-10-CM

## 2011-07-07 DIAGNOSIS — E669 Obesity, unspecified: Secondary | ICD-10-CM

## 2011-07-07 DIAGNOSIS — I10 Essential (primary) hypertension: Secondary | ICD-10-CM

## 2011-07-07 DIAGNOSIS — Z0000 Encounter for general adult medical examination without abnormal findings: Secondary | ICD-10-CM

## 2011-07-07 DIAGNOSIS — G4733 Obstructive sleep apnea (adult) (pediatric): Secondary | ICD-10-CM

## 2011-07-07 MED ORDER — AMLODIPINE BESYLATE 10 MG PO TABS: 10.0000 mg | ORAL_TABLET | Freq: Every day | ORAL | Status: DC

## 2011-07-07 MED ORDER — TESTOSTERONE 12.5 MG/ACT (1%) TD GEL: 5.0000 g | Freq: Every day | TRANSDERMAL | Status: DC

## 2011-07-07 MED ORDER — METOPROLOL SUCCINATE ER 25 MG PO TB24: 25.0000 mg | ORAL_TABLET | Freq: Every day | ORAL | Status: DC

## 2011-07-07 MED ORDER — HYDROCOD POLST-CHLORPHEN POLST 10-8 MG/5ML PO LQCR: 5.0000 mL | Freq: Two times a day (BID) | ORAL | Status: DC | PRN

## 2011-07-07 MED ORDER — LISINOPRIL 40 MG PO TABS: 40.0000 mg | ORAL_TABLET | Freq: Every day | ORAL | Status: DC

## 2011-07-07 MED ORDER — ALFUZOSIN HCL ER 10 MG PO TB24: 10.0000 mg | ORAL_TABLET | Freq: Every day | ORAL | Status: DC

## 2011-07-07 MED ORDER — SAXAGLIPTIN HCL 5 MG PO TABS: 5.0000 mg | ORAL_TABLET | Freq: Every day | ORAL | Status: DC

## 2011-07-07 MED ORDER — GLIMEPIRIDE 4 MG PO TABS: 4.0000 mg | ORAL_TABLET | Freq: Two times a day (BID) | ORAL | Status: DC

## 2011-07-07 MED ORDER — INSULIN GLARGINE 100 UNIT/ML ~~LOC~~ SOLN: 70.0000 [IU] | SUBCUTANEOUS | Status: DC

## 2011-07-07 MED ORDER — FLUOCINONIDE-E 0.05 % EX CREA: TOPICAL_CREAM | CUTANEOUS | Status: DC

## 2011-07-07 MED ORDER — ROSUVASTATIN CALCIUM 5 MG PO TABS: 5.0000 mg | ORAL_TABLET | Freq: Every day | ORAL | Status: DC

## 2011-07-07 MED ORDER — FUROSEMIDE 40 MG PO TABS: 40.0000 mg | ORAL_TABLET | Freq: Every day | ORAL | Status: DC

## 2011-07-07 MED ORDER — PIOGLITAZONE HCL-METFORMIN ER 15-1000 MG PO TB24: 2.0000 | ORAL_TABLET | Freq: Two times a day (BID) | ORAL | Status: DC

## 2011-07-07 NOTE — Patient Instructions (Signed)
Today we updated your med list in our EPIC system...    Continue your current medications the same...    We wrote a new prescription for TUSSIONEX to take one tsp every 12H as needed for cough...  We decided to try the low dose CRESTOR 5mg /d for your Lipids...    After you have been on this several months- call us at [phone] to set up repeat fasting blood work...  If you are interested> the phone number for [facility] is [phone]  Call for any questions...  We should plan a follow up visit in 6 months to check your progress.Marland KitchenMarland Kitchen

## 2011-08-03 ENCOUNTER — Telehealth: Payer: Self-pay | Admitting: Pulmonary Disease

## 2011-08-03 MED ORDER — INSULIN PEN NEEDLE 31G X 8 MM MISC: Status: DC

## 2011-08-03 NOTE — Telephone Encounter (Signed)
rx has been placed in the mail for the pt.

## 2011-08-03 NOTE — Telephone Encounter (Signed)
Spoke with pt. He is requesting rx for insulin needles be mailed to him. I verified his address and have placed rx along with envelope on SN's cart to be signed, thanks!

## 2011-08-08 ENCOUNTER — Encounter: Payer: Self-pay | Admitting: Pulmonary Disease

## 2011-08-08 NOTE — Progress Notes (Addendum)
Subjective:    Patient ID: [name] [name], male    DOB: 01-Jul-1950, 61 y.o.   MRN: [id number]  HPI 61 y/o WM to establish for management of mult medical problems including OSA, HBP, DM w/ neuropathy, Obesity, & other problems listed below... he is a personal friend of DrGamble, DrClance & the brother-in-law of Brett Canales Minor.  ~  April 15, 2010:  We reviewed his problem list below> he has not lost any weight & discussed diet/ exercise again "I am struggling with it", he is asked to again consider Bariatric surg & given the [facility] phone # to call... he notes some atyp CP, incr DOE w/ stairs etc, & rare self-lim palpit> and we discussed need for cardiac eval (Cath vs another Myoview- last 2006 was neg)... BP stable on meds but needs to lose wt to avoid incr meds in the future:  Chol controlled on meds;  BS is improved w/ A1c=6.8;  f/u colonoscopy due 2013;  he saw DrOttelin for Urology w/ hypogonadism, ED/ BPH- started on Androgel/ Levitra/ Uroxatrol...  ~  October 14, 2010:  66mo ROV & he feels that he is doing satis, but unfortunately has not lost any weight> he is again referred to the [facility] Bariatric Surg program & he will inquire... He tells me that his company pays for a health coach to check his meds & they scared him... We reviewed the indication for a follow up Cards eval due to mult risk factors... We reviewed labs & XRays from 11/11> f/u BS=176, A1c=7.1 (on Lantus, Actos+Met, & Glimep)... See prob list below>>  ~  July 07, 2011:  8-20mo ROV & CPX> see prob list below>>    He saw DrCrenshaw 10/12> he reviewed prev work up w/ 2DEcho, Myoview, Cath; he needs vigorous exercise program & wt reduction...    BP controlled on ToprolXL25, Norvasc10, Lisinopril40, & Lasix40; BP= 144/78 & he denies CP, palpit, dizzy, ch in SOB/DOE, edema, etc...    He has been off all statins & says he feels better> DrCrenshaw changed Simva40 to Prav40 due to Norvasc but pt just stopped them all 9/12 due to aching in his  muscles; FLP on diet alone showed TChol 197, TG 174, HDL 42, LDL 121; he has agreed to try Crestor5mg /d at this time...    His DM meds include: Lantus70Qam, Actos+Met 15-1000 Bid, Amaryl4mg Bid, Onglyza5mg /d; BS=97, A1c=7.1; rec to continue same meds, get on diet, get weight down...    His weight is 284# which is down 5# over the last 40mo; he is not in favor of Bariatric surg & has declined to call [facility] for their intake session; we reviewed diet & exercise prescriptions...    He has seen DrOttelin in the past for Urology w/ Low-T, ED, & BPH; he is on TESTIM 5gm/d & Uroxatrol10mg /d; Testos level= 318 & PSA= 0.67; he notes that energy level & drive seem ok to him therefore no changes made in his medication. LABS 2/13:  FLP- not at goals on diet alone;  Chems- ok w/ BS=97 A1c=7.1;  CBC- wnl;  TSH=2.59, PSA=0.67;  Testos=318   Problem List:  OBSTRUCTIVE SLEEP APNEA (ICD-327.23) - followed by DrClance & wears CPAP compliantly...  ~  he had a sleep study in 1995 (wt=235#) w/ 53 events/hr & desat to 83%- started on CPAP... ~  9/11:  f/u OV w/ DrClance  (wt=290#) and he ordered new CPAP machine for [name], last autotitrate showed optimal pressure= 13cmH2O.  HYPERTENSION (ICD-401.9) - on TOPROL XL 25mg /d, AMLODIPINE  10mg /d, LISINOPRIL 40mg /d, & FUROSEMIDE 40mg /d... ~  6/12:  BP= 150/80 & similar at home; denies HA, visual changes, CP, palipit, dizziness, syncope, dyspnea, edema, etc; he knows to elim sodium from diet & lose weight to control BP better... ~  CXR 9/12 showed mild cardiomeg- stable, clear lungs, mild DJD in spine...  CARDIAC screening eval & subsequent eval for dyspnea>> seen by DrCrenshaw... ~  Myoview 7/06 showed apical thinning, no ischemia, EF= 66% & normal wall motion. ~  EKG shows NSR, WNL... ~  2DEcho 7/12 showed incr wall thickness c/w modLVH, norm sys function w/ EF=60-65%, mild LAdil, PAsys~23mmHg est.. ~  Myoview 7/12 showed only fair exerc capacity (verySOB), hypertensive BP  response, no CP, no ST changes, normal perfusion & EF=60% ~  Subsequent cath 9/12 by DrArida showed nonobstructive CAD in LAD & RCA, normal LVF w/ EF=65%, LVEDP=22 but right heart pressures were WNL & no pulmHTN...  HYPERLIPIDEMIA (ICD-272.4) - prev on Simva40 but it was changed to Prav40 9/12 due to Norvasc restriction; he decided on his own to stop all statin meds due to musc aching which improved off the med; f/u FLP 2/13 not at goals on diet alone & he agreed to start Crestor5mg /d...  ~  FLP 12/09 showed TChol 124, TG 108, HDL 39, LDL 64 ~  FLP 11/10 showed TChol 145, TG 76, HDL 43, LDL 86 ~  FLP 11/11 showed TChol 148, TG 91, HDL 47, LDL 83 ~  FLP 7/12 on Simva40 showed TChol 144, TG 83, HDL 49, LDL 78 ~  9/12: DrCrenshaw switched him to Prav40, but he stopped all statins due to aching... ~  FLP 2/13 on diet alone showed TChol 197, TG 174, HDL 42, LDL 121... He has agreed to take Cres5.  DIABETES MELLITUS, TYPE II (ICD-250.00) - on LANTUS 70u daily,  ACTOS+MET 1000-15 Bid, GLIMEPIRIDE 4mg Bid & ONGLYZA 5mg /d...  ~  his Urine microalb has been +pos, w/ norm BUN & Creat... ~  labs 12/09 showed BS= 86, A1c= 8.2 ~  labs 11/10 showed BS= 162, A1c= 8.5 ~  labs 11/11 showed BS= 114, A1c= 6.8 ~  Labs 6/12 showed BS= 176, A1c= 7.1... We discussed the benefits to his DM from Bariatric surg. ~  Labs 2/13 on Lantus70+9meds showed BS= 97, A1c= 7.1.Marland KitchenMarland Kitchen Continue same, get on diet, get wt down, watch BS.  EXOGENOUS OBESITY (ICD-278.00) - his weight has been consistantly in the 270-290# range x yrs... we discussed low carb/ low fat diet & exercise program needed to result in substantial weight loss... ~  weight 12/09 = 279#,  68" tall,  BMI= 42.5 ~  weight 11/10 = 276#, 68" tall,  BMI= 42 ~  weight 9/11 = 290#,  68" tall,  BMI= 44... we discussed Bariatric surgery. ~  weight 12/11 = 290# ~  Weight 6/12 = 289# and he is encouraged to call [facility] regarding their Bariatric program. ~  Weight 2/1`3 = 284# and he  is not interested in Bariatric surg...  DIVERTICULOSIS OF COLON (ICD-562.10) ~  Colonoscopy 3/03 by Dorris Singh showed divertics, otherw neg... f/u planned 33yrs. ~  CT scan of abd 1/04 showed diverticulosis...  TESTICULAR HYPOFUNCTION (ICD-257.2) - he has prev been on Testim, but on & off medication due to insurance coverage problems... ~  labs 7/10 showed Testosterone level = 189 (350-890) ~  labs 12/10 showed Testosterone level = 231 ~  10/11:  eval by DrOttelin for Hypogonadism, ED, BPH> Rx w/ ANDROGEL 5gm/d, LEVITRA 20mg  Prn, UROXATROL  10mg /d... ~  11/11:  labs here showed Testosterone level = 327, PSA= 0.56 ~  Labs 2/13 showed Testos level = 318, PSA= 0.67  ERECTILE DYSFUNCTION, ORGANIC (ICD-607.84) - uses VIAGRA 100mg  vs LEVITRA 20mg .  DISC DISEASE, CERVICAL (ICD-722.4) - he is s/p 2 separate cervical operations from DrHirsh in 2008 & 2009  BURSITIS, LEFT SHOULDER (ICD-726.10) - he uses OTC anti-inflamm medications Prn...  Hx of ANEMIA (ICD-285.9) - he takes MVI daily... ~  labs 12/09 (after [facility] surg) showed Hg= 10.9, MCV= 77 ~  labs 11/10 showed Hg= 12.3, MCV= 79 ~  labs 11/11 showed Hg= 12.6, MCV= 80 ~  Labs 2/13 showed Hg= 13.6, MCV= 80   Past Surgical History  Procedure Date  . Posterior laminectomy / decompression cervical spine     c5-6 fusion 12/08 Dr. Blanche East  . S/p cervical disc surgery and fusion 10/09    Dr. Blanche East    Outpatient Encounter Prescriptions as of 07/07/2011  Medication Sig Dispense Refill  . alfuzosin (UROXATRAL) 10 MG 24 hr tablet Take 1 tablet (10 mg total) by mouth daily.  90 tablet  3  . amLODipine (NORVASC) 10 MG tablet Take 1 tablet (10 mg total) by mouth daily.  90 tablet  3  . aspirin 81 MG tablet Take 81 mg by mouth daily.        . fluocinonide-emollient (LIDEX-E) 0.05 % cream Apply to area as directed  90 g  3  . furosemide (LASIX) 40 MG tablet Take 1 tablet (40 mg total) by mouth daily.  90 tablet  3  . glimepiride (AMARYL) 4 MG tablet  Take 1 tablet (4 mg total) by mouth 2 (two) times daily.  180 tablet  3  . insulin glargine (LANTUS SOLOSTAR) 100 UNIT/ML injection Inject 70 Units into the skin every morning.  30 mL  3  . Insulin Pen Needle 31G X 8 MM MISC Use once daily as directed  100 each  3  . lisinopril (PRINIVIL,ZESTRIL) 40 MG tablet Take 1 tablet (40 mg total) by mouth daily.  90 tablet  3  . metoprolol succinate (TOPROL XL) 25 MG 24 hr tablet Take 1 tablet (25 mg total) by mouth daily.  90 tablet  3  . naproxen sodium (ALEVE) 220 MG tablet As needed for pain       . Pioglitazone HCl-Metformin HCl 15-1000 MG TB24 Take 2 tablets by mouth 2 (two) times daily.  360 tablet  3  . saxagliptin HCl (ONGLYZA) 5 MG TABS tablet Take 1 tablet (5 mg total) by mouth daily.  90 tablet  3  . Testosterone (ANDROGEL PUMP) 1.25 GM/ACT (1%) GEL Place 5 g onto the skin daily. 2 pumps per day, 5.0 gms once a day  75 g  5  . chlorpheniramine-HYDROcodone (TUSSIONEX PENNKINETIC ER) 10-8 MG/5ML LQCR Take 5 mLs by mouth every 12 (twelve) hours as needed.  140 mL  5  . rosuvastatin (CRESTOR) 5 MG tablet Take 1 tablet (5 mg total) by mouth at bedtime.  90 tablet  3    Allergies  Allergen Reactions  . Amoxicillin     REACTION: unspecified  . Levofloxacin     REACTION: unspecified  . Penicillins     Current Medications, Allergies, Past Medical History, Past Surgical History, Family History, and Social History were reviewed in [employer] record.   Review of Systems        The patient complains of fatigue, chest pain, palpitations, dyspnea on exertion, and  stiffness.  The patient denies fever, chills, sweats, anorexia, weakness, malaise, weight loss, sleep disorder, blurring, diplopia, eye irritation, eye discharge, vision loss, eye pain, photophobia, earache, ear discharge, tinnitus, decreased hearing, nasal congestion, nosebleeds, sore throat, hoarseness, syncope, orthopnea, PND, peripheral edema, cough, dyspnea at  rest, excessive sputum, hemoptysis, wheezing, pleurisy, nausea, vomiting, diarrhea, constipation, change in bowel habits, abdominal pain, melena, hematochezia, jaundice, gas/bloating, indigestion/heartburn, dysphagia, odynophagia, dysuria, hematuria, urinary frequency, urinary hesitancy, nocturia, incontinence, back pain, joint pain, joint swelling, muscle cramps, muscle weakness, arthritis, sciatica, restless legs, leg pain at night, leg pain with exertion, rash, itching, dryness, suspicious lesions, paralysis, paresthesias, seizures, tremors, vertigo, transient blindness, frequent falls, frequent headaches, difficulty walking, depression, anxiety, memory loss, confusion, cold intolerance, heat intolerance, polydipsia, polyphagia, polyuria, unusual weight change, abnormal bruising, bleeding, enlarged lymph nodes, urticaria, allergic rash, hay fever, and recurrent infections.    Objective:   Physical Exam     WD, Obese, 61 y/o WM in NAD... GENERAL:  Alert & oriented; pleasant & cooperative... HEENT:  [place]/AT, EOM-wnl, PERRLA, EACs-clear, TMs-wnl, NOSE-clear, THROAT-clear & wnl. NECK:  Supple w/ fairROM; no JVD; normal carotid impulses w/o bruits; no thyromegaly or nodules palpated; no lymphadenopathy. Scar of his ant cerv disc surg... CHEST:  Clear to P & A; without wheezes/ rales/ or rhonchi. HEART:  Regular Rhythm; without murmurs/ rubs/ or gallops. ABDOMEN:  Obese, soft & nontender; normal bowel sounds; no organomegaly or masses detected. EXT: without deformities, mild arthritic changes; no varicose veins/ +venous insuffic/ tr edema. NEURO:  CN's intact; motor testing normal; sensory testing normal; gait normal & balance OK. DERM:  No lesions noted; no rash etc...  RADIOLOGY DATA:  Reviewed in the EPIC EMR & discussed w/ the patient...  LABORATORY DATA:  Reviewed in the EPIC EMR & discussed w/ the patient...    >>LABS 2/13:  FLP- not at goals on diet alone;  Chems- ok w/ BS=97 A1c=7.1;  CBC-  wnl;  TSH=2.59, PSA=0.67;  Testos=318   Assessment & Plan:   OSA>  Followed by DrClance; he uses his CPAP nightly; denies daytime hypersomnolence etc...  HBP>  Controlled on his 4 meds + diet etc; he understands how weight reduction would be very beneficial & help him to come off meds...  HYPERCHOLESTEROLEMIA>  He stopped his statin meds and tried diet alone for 26mo; FLP not at goals & needs better low fat diet, wt reduction, & try Cres5...  DM>  On Lantus, Glime,. Actos+Met, Onglyza... Control is fair & very weight dependent...  OBESITY>  Weight reduction is critical to his health & longevity... He is rec to discuss options w/ [facility]...  Testic Hypofunction> Followed by DrOttelin for Urology on Androgel vs Testim...  Other medical issues as noted...   Patient's Medications  New Prescriptions   CHLORPHENIRAMINE-HYDROCODONE (TUSSIONEX PENNKINETIC ER) 10-8 MG/5ML LQCR    Take 5 mLs by mouth every 12 (twelve) hours as needed.   INSULIN PEN NEEDLE 31G X 8 MM MISC    Use as directed   ROSUVASTATIN (CRESTOR) 5 MG TABLET    Take 1 tablet (5 mg total) by mouth at bedtime.  Previous Medications   ASPIRIN 81 MG TABLET    Take 81 mg by mouth daily.     INSULIN PEN NEEDLE 31G X 8 MM MISC    Use once daily as directed   NAPROXEN SODIUM (ALEVE) 220 MG TABLET    As needed for pain   Modified Medications   Modified Medication Previous Medication   ALFUZOSIN (  UROXATRAL) 10 MG 24 HR TABLET alfuzosin (UROXATRAL) 10 MG 24 hr tablet      Take 1 tablet (10 mg total) by mouth daily.    Take 10 mg by mouth daily.     AMLODIPINE (NORVASC) 10 MG TABLET amLODipine (NORVASC) 10 MG tablet      Take 1 tablet (10 mg total) by mouth daily.    Take 10 mg by mouth daily.     FLUOCINONIDE-EMOLLIENT (LIDEX-E) 0.05 % CREAM fluocinonide-emollient (LIDEX-E) 0.05 % cream      Apply to area as directed    as directed.   FUROSEMIDE (LASIX) 40 MG TABLET furosemide (LASIX) 40 MG tablet      Take 1 tablet (40 mg total) by  mouth daily.    Take 40 mg by mouth daily.     GLIMEPIRIDE (AMARYL) 4 MG TABLET glimepiride (AMARYL) 4 MG tablet      Take 1 tablet (4 mg total) by mouth 2 (two) times daily.    Take 1 tablet (4 mg total) by mouth 2 (two) times daily.   INSULIN GLARGINE (LANTUS SOLOSTAR) 100 UNIT/ML INJECTION insulin glargine (LANTUS SOLOSTAR) 100 UNIT/ML injection      Inject 70 Units into the skin every morning.    Inject 70 Units into the skin every morning.    LISINOPRIL (PRINIVIL,ZESTRIL) 40 MG TABLET lisinopril (PRINIVIL,ZESTRIL) 40 MG tablet      Take 1 tablet (40 mg total) by mouth daily.    Take 40 mg by mouth daily.     METOPROLOL SUCCINATE (TOPROL XL) 25 MG 24 HR TABLET metoprolol succinate (TOPROL XL) 25 MG 24 hr tablet      Take 1 tablet (25 mg total) by mouth daily.    Take 1 tablet (25 mg total) by mouth daily.   PIOGLITAZONE HCL-METFORMIN HCL 15-1000 MG TB24 Pioglitazone HCl-Metformin HCl (ACTOPLUS MET XR) 15-1000 MG TB24      Take 2 tablets by mouth 2 (two) times daily.    1 tablet twice a day   SAXAGLIPTIN HCL (ONGLYZA) 5 MG TABS TABLET saxagliptin HCl (ONGLYZA) 5 MG TABS tablet      Take 1 tablet (5 mg total) by mouth daily.    Take 1 tablet (5 mg total) by mouth daily.   TESTOSTERONE (ANDROGEL PUMP) 1.25 GM/ACT (1%) GEL Testosterone (ANDROGEL PUMP) 1.25 GM/ACT (1%) GEL      Place 5 g onto the skin daily. 2 pumps per day, 5.0 gms once a day    2 pumps per day, 5.0 gms once a day  Discontinued Medications   FEXOFENADINE (ALLEGRA) 180 MG TABLET    Take 180 mg by mouth daily. PRN   PIOGLITAZONE HCL-METFORMIN HCL 15-1000 MG TB24    2  tablet twice a day   VARDENAFIL (LEVITRA) 20 MG TABLET    Take 20 mg by mouth daily. PRN

## 2011-08-12 ENCOUNTER — Other Ambulatory Visit: Payer: Self-pay | Admitting: *Deleted

## 2011-08-12 MED ORDER — INSULIN PEN NEEDLE 31G X 8 MM MISC: Status: DC

## 2011-08-12 MED ORDER — PIOGLITAZONE HCL-METFORMIN ER 15-1000 MG PO TB24: 2.0000 | ORAL_TABLET | Freq: Two times a day (BID) | ORAL | Status: DC

## 2011-08-12 MED ORDER — INSULIN GLARGINE 100 UNIT/ML ~~LOC~~ SOLN: 70.0000 [IU] | SUBCUTANEOUS | Status: DC

## 2011-08-12 NOTE — Telephone Encounter (Signed)
Electronic prescriptions sent to [facility].

## 2011-08-16 ENCOUNTER — Other Ambulatory Visit: Payer: Self-pay | Admitting: *Deleted

## 2011-08-16 MED ORDER — PIOGLITAZONE HCL-METFORMIN ER 15-1000 MG PO TB24: 1.0000 | ORAL_TABLET | Freq: Two times a day (BID) | ORAL | Status: DC

## 2012-03-10 ENCOUNTER — Other Ambulatory Visit: Payer: Self-pay | Admitting: Pulmonary Disease

## 2012-03-21 ENCOUNTER — Encounter: Payer: Self-pay | Admitting: Gastroenterology

## 2012-07-11 ENCOUNTER — Telehealth: Payer: Self-pay | Admitting: Pulmonary Disease

## 2012-07-11 DIAGNOSIS — Z0000 Encounter for general adult medical examination without abnormal findings: Secondary | ICD-10-CM

## 2012-07-11 NOTE — Telephone Encounter (Signed)
SN is ok for labs to be placed in the computer.  These orders have been placed and pt is aware. Nothing further is needed.

## 2012-07-11 NOTE — Telephone Encounter (Signed)
Would you be okay with ordering these labs before his appointment? If so what labs need to be ordered?  Please advise. Thanks.

## 2012-07-17 ENCOUNTER — Other Ambulatory Visit (INDEPENDENT_AMBULATORY_CARE_PROVIDER_SITE_OTHER): Payer: Managed Care, Other (non HMO)

## 2012-07-17 DIAGNOSIS — Z0000 Encounter for general adult medical examination without abnormal findings: Secondary | ICD-10-CM

## 2012-07-17 LAB — CBC WITH DIFFERENTIAL/PLATELET
Basophils Absolute: 0 10*3/uL (ref 0.0–0.1)
Eosinophils Absolute: 0.3 10*3/uL (ref 0.0–0.7)
HCT: 38.9 % — ABNORMAL LOW (ref 39.0–52.0)
Hemoglobin: 13 g/dL (ref 13.0–17.0)
Lymphocytes Relative: 19.1 % (ref 12.0–46.0)
Lymphs Abs: 1.2 10*3/uL (ref 0.7–4.0)
MCHC: 33.5 g/dL (ref 30.0–36.0)
MCV: 79.4 fl (ref 78.0–100.0)
Monocytes Absolute: 0.3 10*3/uL (ref 0.1–1.0)
Neutro Abs: 4.3 10*3/uL (ref 1.4–7.7)
RDW: 15.6 % — ABNORMAL HIGH (ref 11.5–14.6)

## 2012-07-17 LAB — BASIC METABOLIC PANEL
Calcium: 9.3 mg/dL (ref 8.4–10.5)
Creatinine, Ser: 0.9 mg/dL (ref 0.4–1.5)
GFR: 90.91 mL/min (ref >60.00)
Glucose, Bld: 139 mg/dL — ABNORMAL HIGH (ref 70–99)
Sodium: 138 mEq/L (ref 135–145)

## 2012-07-17 LAB — MICROALBUMIN / CREATININE URINE RATIO
Creatinine,U: 107.2 mg/dL
Microalb Creat Ratio: 82.9 mg/g — ABNORMAL HIGH (ref 0.0–30.0)

## 2012-07-17 LAB — HEPATIC FUNCTION PANEL
Alkaline Phosphatase: 71 U/L (ref 39–117)
Bilirubin, Direct: 0.1 mg/dL (ref 0.0–0.3)
Total Bilirubin: 0.6 mg/dL (ref 0.3–1.2)
Total Protein: 6.7 g/dL (ref 6.0–8.3)

## 2012-07-17 LAB — LIPID PANEL
HDL: 46.5 mg/dL (ref >39.00)
Total CHOL/HDL Ratio: 3
Triglycerides: 77 mg/dL (ref 0.0–149.0)
VLDL: 15.4 mg/dL (ref 0.0–40.0)

## 2012-07-19 ENCOUNTER — Telehealth: Payer: Self-pay | Admitting: Pulmonary Disease

## 2012-07-19 ENCOUNTER — Encounter: Payer: Self-pay | Admitting: Pulmonary Disease

## 2012-07-19 ENCOUNTER — Ambulatory Visit (INDEPENDENT_AMBULATORY_CARE_PROVIDER_SITE_OTHER): Payer: Managed Care, Other (non HMO) | Admitting: Pulmonary Disease

## 2012-07-19 VITALS — BP 170/88 | HR 85 | Temp 98.8°F | Ht 70.0 in | Wt 299.8 lb

## 2012-07-19 DIAGNOSIS — E669 Obesity, unspecified: Secondary | ICD-10-CM

## 2012-07-19 DIAGNOSIS — I10 Essential (primary) hypertension: Secondary | ICD-10-CM

## 2012-07-19 DIAGNOSIS — G4733 Obstructive sleep apnea (adult) (pediatric): Secondary | ICD-10-CM

## 2012-07-19 DIAGNOSIS — E291 Testicular hypofunction: Secondary | ICD-10-CM

## 2012-07-19 DIAGNOSIS — E119 Type 2 diabetes mellitus without complications: Secondary | ICD-10-CM

## 2012-07-19 DIAGNOSIS — M5030 Other cervical disc degeneration, unspecified cervical region: Secondary | ICD-10-CM

## 2012-07-19 DIAGNOSIS — Z0000 Encounter for general adult medical examination without abnormal findings: Secondary | ICD-10-CM

## 2012-07-19 DIAGNOSIS — I2510 Atherosclerotic heart disease of native coronary artery without angina pectoris: Secondary | ICD-10-CM

## 2012-07-19 DIAGNOSIS — K5730 Diverticulosis of large intestine without perforation or abscess without bleeding: Secondary | ICD-10-CM

## 2012-07-19 DIAGNOSIS — E785 Hyperlipidemia, unspecified: Secondary | ICD-10-CM

## 2012-07-19 MED ORDER — SAXAGLIPTIN HCL 5 MG PO TABS: 5.0000 mg | ORAL_TABLET | Freq: Every day | ORAL | Status: DC

## 2012-07-19 MED ORDER — INSULIN GLARGINE 100 UNIT/ML ~~LOC~~ SOLN: 70.0000 [IU] | SUBCUTANEOUS | Status: DC

## 2012-07-19 MED ORDER — GLIMEPIRIDE 4 MG PO TABS: 4.0000 mg | ORAL_TABLET | Freq: Two times a day (BID) | ORAL | Status: DC

## 2012-07-19 MED ORDER — LISINOPRIL 40 MG PO TABS: 40.0000 mg | ORAL_TABLET | Freq: Every day | ORAL | Status: DC

## 2012-07-19 MED ORDER — INSULIN PEN NEEDLE 31G X 8 MM MISC: Status: DC

## 2012-07-19 MED ORDER — HYDROCOD POLST-CHLORPHEN POLST 10-8 MG/5ML PO LQCR: 5.0000 mL | Freq: Two times a day (BID) | ORAL | Status: DC

## 2012-07-19 MED ORDER — FUROSEMIDE 40 MG PO TABS: 40.0000 mg | ORAL_TABLET | Freq: Every day | ORAL | Status: DC

## 2012-07-19 MED ORDER — METOPROLOL SUCCINATE ER 25 MG PO TB24: 25.0000 mg | ORAL_TABLET | Freq: Every day | ORAL | Status: DC

## 2012-07-19 MED ORDER — TESTOSTERONE 20.25 MG/1.25GM (1.62%) TD GEL: 2.0000 "application " | Freq: Every day | TRANSDERMAL | Status: DC

## 2012-07-19 MED ORDER — ROSUVASTATIN CALCIUM 5 MG PO TABS: 5.0000 mg | ORAL_TABLET | Freq: Every day | ORAL | Status: DC

## 2012-07-19 MED ORDER — PIOGLITAZONE HCL-METFORMIN ER 15-1000 MG PO TB24: 1.0000 | ORAL_TABLET | Freq: Two times a day (BID) | ORAL | Status: DC

## 2012-07-19 MED ORDER — AMLODIPINE BESYLATE 10 MG PO TABS: 10.0000 mg | ORAL_TABLET | Freq: Every day | ORAL | Status: DC

## 2012-07-19 MED ORDER — ALFUZOSIN HCL ER 10 MG PO TB24: 10.0000 mg | ORAL_TABLET | Freq: Every day | ORAL | Status: DC

## 2012-07-19 NOTE — Patient Instructions (Addendum)
Today we updated your med list in our EPIC system...    Continue your current medications the same...  We refilled the Lasix (Furosemide) 40mg  & you need to take this every day WITH a vigorous salt/sodium rwestriction...  We refilled ANDROGEL 1.62%- try 2 pumps rubbed into skin daily & let's plan a follow up Testos level in 2-3 months to see if we need to increase the dose...  Let's get on track w/ our diet & exercise program...  We will send an interoffice referral to [facility] for your colonoscopy...  Let's further plan a follow up visit in 6 months.Marland KitchenMarland Kitchen

## 2012-07-19 NOTE — Progress Notes (Signed)
Subjective:    Patient ID: [name] [name], male    DOB: [birth year], 62 y.o.   MRN: [id number]  HPI 62 y/o WM here for f/u of mult medical problems including OSA, HBP, DM w/ neuropathy, Obesity, & other problems listed below... he is a personal friend of DrGamble, DrClance & the brother-in-law of Brett Canales Minor.  ~  October 14, 2010:  84mo ROV & he feels that he is doing satis, but unfortunately has not lost any weight> he is again referred to the [facility] Bariat[facility]urg program & he will inquire... He tells me that his company pays for a health coach to check his meds & they scared him... We reviewed the indication for a follow up Cards eval due to mult risk factors... We reviewed labs & XRays from 11/11> f/u BS=176, A1c=7.1 (on Lantus, Actos+Met, & Glimep)... See prob list below>>  ~  July 07, 2011:  8-37mo ROV & CPX> see prob list below>>    He saw DrCrenshaw 10/12> he reviewed prev work up w/ 2DEcho, Myoview, Cath; he needs vigorous exercise program & wt reduction...    BP controlled on ToprolXL25, Norvasc10, Lisinopril40, & Lasix40; BP= 144/78 & he denies CP, palpit, dizzy, ch in SOB/DOE, edema, etc...    He has been off all statins & says he feels better> DrCrenshaw changed Simva40 to Prav40 due to Norvasc but pt just stopped them all 9/12 due to aching in his muscles; FLP on diet alone showed TChol 197, TG 174, HDL 42, LDL 121; he has agreed to try Crestor5mg /d at this time...    His DM meds include: Lantus70Qam, Actos+Met 15-1000 Bid, Amaryl4mg Bid, Onglyza5mg /d; BS=97, A1c=7.1; rec to continue same meds, get on diet, get weight down...    His weight is 284# which is down 5# over the last 23mo; he is not in favor of Bariatric surg & has declined to call [facility] for their intake session; we reviewed diet & exercise prescriptions...    He has seen DrOttelin in the past for Urology w/ Low-T, ED, & BPH; he is on TESTIM 5gm/d & Uroxatrol10mg /d; Testos level= 318 & PSA= 0.67; he notes that energy level & drive seem  ok to him therefore no changes made in his medication. LABS 2/13:  FLP- not at goals on diet alone;  Chems- ok w/ BS=97 A1c=7.1;  CBC- wnl;  TSH=2.59, PSA=0.67;  Testos=318  ~  July 19, 2012:  Yearly ROV & CPX>  [name] [name] says he is feeling OK but has had a stressful yr- wife Dx & Rx for Breast cancer, he has been unable to lose wt & has gained 16# to 300# today w/ 2+pitting edema;  He tells me he stopped the Lasix40 due to excessive urination, and his BP is up to 170/88 today as a result...  We reviewed the following medical problems during today's office visit >>     OSA> on CPAP & followed by DrClance; he is 100% compliant & can't sleep w/o it; rests well, awakens at 4:30 every day- feels rested and denies daytime hypersomnolence etc; his CC= congestion & chest discomfort when he arises but this rapidly goes away when up & about...    HBP> on MeptopER25, Amlod10, Lisin40, Lasix40; known modLVH on 2DEcho 2012; admits to NOT taking the Lasix in months due to excess urination; in the meanwhile he has gained 16#, has VI & pitting edema, and BP= 170/88; we discussed the need for taking all meds every day, 2Gm Na diet, Lasix40 every day, &  monitor BP at home...    CAD> on above + ASA81; known non-obstructive CAD on cath 2012; prev cardiac eval by DrCrenshaw- mult risk factors; he is too sedentary, denies CP/angina/ palpit/ ch in SOB; asked to join the [facility] or a gym for trainer to help w/ exercise program...    VI, Edema> we reviewed the critically important nature of NO SALT, elevation, support hose & take the Lasix40 every day...     Hyperlipid> on Crestor5; tolerating the low dose statin very well & FLP much improved over diet alone; FLP 3/14 shows TChol 145, TG 77, HDL 47, LDL 83    DM-II> on Lantus70u, Actos+Met15-1000Bid, Glimep4Bid, Ongly5; labs 3/14 showed BS=139, A1c=7.3; Rec to continue same meds, get on diet, get wt down!    Obesity> wt=300# up 16# in the last yr (BMI~45); we reviewed diet, exercise,  wt reduction strategies- he has been referred to [facility] for their Bariatric program but he tells me his wife refuses to let him consider this operation...    Divertics> known divertics & asymptomatic w/o abd pain, n/v, c/d, blood seen; he is due for f/u colonoscopy w/ DrKaplan & we will refer...    Hypogonad> he stopped his topical prep last yr; he has seen DrOttelin; notes that his energy is fair & drive is diminished; we decided to Rx w/ Androgel 1.62%- 2pumps & recheck level in 2-20mo before considering incr dose or shots...    DDD- Cspine> aware, uses aleve as needed We reviewed prob list, meds, xrays and labs> see below for updates >>  LABS 3/14:  FLP- at goals on Cres5;  Chems- ok x BUN=27 (GFR is 92), BS=139 A1c=7.3;  CBC- wnl;  TSH=1.81;  PSA=0.50;  Testos=257;  Umicroalb=pos   Problem List:  OBSTRUCTIVE SLEEP APNEA (ICD-327.23) - followed by DrClance & wears CPAP compliantly...  ~  he had a sleep study in 1995 (wt=235#) w/ 53 events/hr & desat to 83%- started on CPAP... ~  9/11:  f/u OV w/ DrClance  (wt=290#) and he ordered new CPAP machine for [name], last autotitrate showed optimal pressure= 13cmH2O. ~  3/14: on CPAP & followed by DrClance; he is 100% compliant & can't sleep w/o it; rests well, awakens at 4:30 every day- feels rested and denies daytime hypersomnolence etc; his CC= congestion & chest discomfort when he arises but this rapidly goes away when up & about.  HYPERTENSION (ICD-401.9) - on TOPROL XL 25mg /d, AMLODIPINE 10mg /d, LISINOPRIL 40mg /d, & FUROSEMIDE 40mg /d... ~  6/12:  BP= 150/80 & similar at home; denies HA, visual changes, CP, palipit, dizziness, syncope, dyspnea, edema, etc; he knows to elim sodium from diet & lose weight to control BP better... ~  CXR 9/12 showed mild cardiomeg- stable, clear lungs, mild DJD in spine... ~  3/14: on MeptopER25, Amlod10, Lisin40, Lasix40; known modLVH on 2DEcho 2012; admits to NOT taking the Lasix in months due to excess urination; in  the meanwhile he has gained 16#, has VI & pitting edema, and BP= 170/88; we discussed the need for taking all meds every day, 2Gm Na diet, Lasix40 every day, & monitor BP at home.  CARDIAC screening eval & subsequent eval for dyspnea>> seen by DrCrenshaw... ~  Myoview 7/06 showed apical thinning, no ischemia, EF= 66% & normal wall motion. ~  EKG shows NSR, WNL... ~  2DEcho 7/12 showed incr wall thickness c/w modLVH, norm sys function w/ EF=60-65%, mild LAdil, PAsys~31mmHg est.. ~  Myoview 7/12 showed only fair exerc capacity (verySOB), hypertensive BP  response, no CP, no ST changes, normal perfusion & EF=60% ~  Subsequent cath 9/12 by DrArida showed nonobstructive CAD in LAD & RCA, normal LVF w/ EF=65%, LVEDP=22 but right heart pressures were WNL & no pulmHTN... ~  3/14: on above + ASA81; known non-obstructive CAD on cath 2012; prev cardiac eval by DrCrenshaw- mult risk factors; he is too sedentary, denies CP/angina/ palpit/ ch in SOB; asked to join the Y or a gym for trainer to help w/ exercise program.  VENOUS INSUFFICIENCY & EDEMA >> on low sodium diet & LASIX40mg /d... ~  3/14:  we reviewed the critically important nature of NO SALT, elevation, support hose & take the Lasix40 every day.  HYPERLIPIDEMIA (ICD-272.4) - prev on Simva40 but it was changed to Prav40 9/12 due to Norvasc restriction; he decided on his own to stop all statin meds due to musc aching which improved off the med; f/u FLP 2/13 not at goals on diet alone & he agreed to start Crestor5mg /d...  ~  FLP 12/09 showed TChol 124, TG 108, HDL 39, LDL 64 ~  FLP 11/10 showed TChol 145, TG 76, HDL 43, LDL 86 ~  FLP 11/11 showed TChol 148, TG 91, HDL 47, LDL 83 ~  FLP 7/12 on Simva40 showed TChol 144, TG 83, HDL 49, LDL 78 ~  9/12: DrCrenshaw switched him to Prav40, but he stopped all statins due to aching... ~  FLP 2/13 on diet alone showed TChol 197, TG 174, HDL 42, LDL 121... He has agreed to take Cres5. ~  FLP 3/14 on Cres5 showed  TChol 145, TG 77, HDL 47, LDL 83  DIABETES MELLITUS, TYPE II (ICD-250.00) - on LANTUS 70u daily,  ACTOS+MET 1000-15 Bid, GLIMEPIRIDE 4mg Bid & ONGLYZA 5mg /d...  ~  his Urine microalb has been +pos, w/ norm BUN & Creat... ~  labs 12/09 showed BS= 86, A1c= 8.2 ~  labs 11/10 showed BS= 162, A1c= 8.5 ~  labs 11/11 showed BS= 114, A1c= 6.8 ~  Labs 6/12 showed BS= 176, A1c= 7.1... We discussed the benefits to his DM from Bariatric surg. ~  Labs 2/13 on Lantus70+61meds showed BS= 97, A1c= 7.1.Marland KitchenMarland Kitchen Continue same, get on diet, get wt down, watch BS. ~  3/14: on Lantus70u, Actos+Met15-1000Bid, Glimep4Bid, Ongly5; labs 3/14 showed BS=139, A1c=7.3; Rec to continue same meds, get on diet, get wt down!   EXOGENOUS OBESITY (ICD-278.00) - his weight has been consistantly in the 270-290# range x yrs... we discussed low carb/ low fat diet & exercise program needed to result in substantial weight loss... ~  weight 12/09 = 279#,  68" tall,  BMI= 42.5 ~  weight 11/10 = 276#, 68" tall,  BMI= 42 ~  weight 9/11 = 290#,  68" tall,  BMI= 44... we discussed Bariatric surgery. ~  weight 12/11 = 290# ~  Weight 6/12 = 289# and he is encouraged to call [facility] regarding their Bariatric program. ~  Weight 2/13 = 284# and he is not interested in Bariatric surg... ~  Weight 3/14 = 300# and he tells me that his wife won't let him consider Bariatric surg...  DIVERTICULOSIS OF COLON (ICD-562.10) ~  Colonoscopy 3/03 by Dorris Singh showed divertics, otherw neg... f/u planned 31yrs. ~  CT scan of abd 1/04 showed diverticulosis... ~  3/14: referred to [facility] for f/u colonoscopy...  TESTICULAR HYPOFUNCTION (ICD-257.2) - he has prev been on Testim, but on & off medication due to insurance coverage problems... ~  labs 7/10 showed Testosterone level = 189 (350-890) ~  labs 12/10 showed Testosterone level = 231 ~  10/11:  eval by DrOttelin for Hypogonadism, ED, BPH> Rx w/ ANDROGEL 5gm/d, LEVITRA 20mg  Prn, UROXATROL 10mg /d... ~  11/11:  labs  here showed Testosterone level = 327, PSA= 0.56 ~  Labs 2/13 showed Testos level = 318, PSA= 0.67 ~  Labs 3/14 off Testos replacement showed Testos level = 257, PSA= 0.50; Rec- restart ANDROGEL 1.62%- 2 pumps...  ERECTILE DYSFUNCTION, ORGANIC (ICD-607.84) - uses VIAGRA 100mg  vs LEVITRA 20mg .  DISC DISEASE, CERVICAL (ICD-722.4) - he is s/p 2 separate cervical operations from DrHirsh in 2008 & 2009  BURSITIS, LEFT SHOULDER (ICD-726.10) - he uses OTC anti-inflamm medications Prn...  Hx of ANEMIA (ICD-285.9) - he takes MVI daily... ~  labs 12/09 (after [facility]Spine surg) showed Hg= 10.9, MCV= 77 ~  labs 11/10 showed Hg= 12.3, MCV= 79 ~  labs 11/11 showed Hg= 12.6, MCV= 80 ~  Labs 2/13 showed Hg= 13.6, MCV= 80 ~  Labs 3/14 showed Hg= 13.0, MCV=79   Past Surgical History  Procedure Laterality Date  . Posterior laminectomy / decompression cervical spine      c5-6 fusion 12/08 Dr. Blanche East  . S/p cervical disc surgery and fusion  10/09    Dr. Blanche East    Outpatient Encounter Prescriptions as of 07/19/2012  Medication Sig Dispense Refill  . alfuzosin (UROXATRAL) 10 MG 24 hr tablet Take 1 tablet (10 mg total) by mouth daily.  90 tablet  3  . amLODipine (NORVASC) 10 MG tablet Take 1 tablet (10 mg total) by mouth daily.  90 tablet  3  . aspirin 81 MG tablet Take 81 mg by mouth daily.        . chlorpheniramine-HYDROcodone (TUSSIONEX) 10-8 MG/5ML LQCR TAKE BY MOUTH EVERY 12 HOURS AS NEEDED  140 mL  0  . furosemide (LASIX) 40 MG tablet Take 1 tablet (40 mg total) by mouth daily.  90 tablet  3  . glimepiride (AMARYL) 4 MG tablet Take 1 tablet (4 mg total) by mouth 2 (two) times daily.  180 tablet  3  . insulin glargine (LANTUS SOLOSTAR) 100 UNIT/ML injection Inject 70 Units into the skin every morning.  30 mL  3  . Insulin Pen Needle 31G X 8 MM MISC Use once daily as directed  100 each  3  . lisinopril (PRINIVIL,ZESTRIL) 40 MG tablet Take 1 tablet (40 mg total) by mouth daily.  90 tablet  3  . metoprolol  succinate (TOPROL XL) 25 MG 24 hr tablet Take 1 tablet (25 mg total) by mouth daily.  90 tablet  3  . naproxen sodium (ALEVE) 220 MG tablet As needed for pain       . Pioglitazone HCl-Metformin HCl 15-1000 MG TB24 Take 1 tablet by mouth 2 (two) times daily.  180 tablet  3  . rosuvastatin (CRESTOR) 5 MG tablet Take 1 tablet (5 mg total) by mouth at bedtime.  90 tablet  3  . saxagliptin HCl (ONGLYZA) 5 MG TABS tablet Take 1 tablet (5 mg total) by mouth daily.  90 tablet  3  . Testosterone (ANDROGEL PUMP) 1.25 GM/ACT (1%) GEL Place 5 g onto the skin daily. 2 pumps per day, 5.0 gms once a day  75 g  5  . [DISCONTINUED] fluocinonide-emollient (LIDEX-E) 0.05 % cream Apply to area as directed  90 g  3   No facility-administered encounter medications on file as of 07/19/2012.    Allergies  Allergen Reactions  . Amoxicillin  REACTION: unspecified  . Levofloxacin     REACTION: unspecified  . Penicillins     Current Medications, Allergies, Past Medical History, Past Surgical History, Family History, and Social History were reviewed in [employer] record.   Review of Systems        The patient complains of fatigue, chest discomfort, dyspnea on exertion, and stiffness in joints.  The patient denies fever, chills, sweats, anorexia, weakness, malaise, weight loss, sleep disorder, blurring, diplopia, eye irritation, eye discharge, vision loss, eye pain, photophobia, earache, ear discharge, tinnitus, decreased hearing, nasal congestion, nosebleeds, sore throat, hoarseness, syncope, orthopnea, PND, peripheral edema, cough, dyspnea at rest, excessive sputum, hemoptysis, wheezing, pleurisy, nausea, vomiting, diarrhea, constipation, change in bowel habits, abdominal pain, melena, hematochezia, jaundice, gas/bloating, indigestion/heartburn, dysphagia, odynophagia, dysuria, hematuria, urinary frequency, urinary hesitancy, nocturia, incontinence, back pain, joint pain, joint swelling,  muscle cramps, muscle weakness, arthritis, sciatica, restless legs, leg pain at night, leg pain with exertion, rash, itching, dryness, suspicious lesions, paralysis, paresthesias, seizures, tremors, vertigo, transient blindness, frequent falls, frequent headaches, difficulty walking, depression, anxiety, memory loss, confusion, cold intolerance, heat intolerance, polydipsia, polyphagia, polyuria, unusual weight change, abnormal bruising, bleeding, enlarged lymph nodes, urticaria, allergic rash, hay fever, and recurrent infections.    Objective:   Physical Exam     WD, Obese, 62 y/o WM in NAD... GENERAL:  Alert & oriented; pleasant & cooperative... HEENT:  Continental/AT, EOM-wnl, PERRLA, EACs-clear, TMs-wnl, NOSE-clear, THROAT-clear & wnl. NECK:  Supple w/ fairROM; no JVD; normal carotid impulses w/o bruits; no thyromegaly or nodules palpated; no lymphadenopathy. Scar of his ant cerv disc surg... CHEST:  Clear to P & A; without wheezes/ rales/ or rhonchi. HEART:  Regular Rhythm; without murmurs/ rubs/ or gallops. ABDOMEN:  Obese, soft & nontender; normal bowel sounds; no organomegaly or masses detected. EXT: without deformities, mild arthritic changes; no varicose veins/ +venous insuffic & 2+pitting edema. NEURO:  CN's intact; motor testing normal; sensory testing normal; gait normal & balance OK. DERM:  No lesions noted; no rash etc...  RADIOLOGY DATA:  Reviewed in the EPIC EMR & discussed w/ the patient...  LABORATORY DATA:  Reviewed in the EPIC EMR & discussed w/ the patient...   Assessment & Plan:    OSA>  Followed by DrClance; he uses his CPAP nightly; denies daytime hypersomnolence etc...  HBP>  Prev cdontrolled on his 4 meds, but not on diet (wt up 16#) and not taking the LASIX; discussed w/ pt> he must take the Lasix daily 7 myust get on 2Gm Na diet!!!  HYPERCHOLESTEROLEMIA>  Much improved & now at goal on Cres5, continue same...  DM>  On Lantus, Glimep, Actos+Met, Onglyza... Control is  fair w/ A1c=7.3 & very weight dependent...  OBESITY>  Weight reduction is critical to his health & longevity... He has been rec & referred to Bariatric surg program at [facility] but he says his wife will not let him have this surg...  Divertics>  He is due for colonoscopy & we will refer...  Testic Hypofunction> Followed by DrOttelin for Urology; he stopped the hormone replacement Rx but feels more sluggish, tired, etc; Rec to restart ANDROGEL 1.62% starting at 2 pumps daily...  Other medical issues as noted...   Patient's Medications  New Prescriptions   TESTOSTERONE (ANDROGEL) 20.25 MG/1.25GM (1.62%) GEL    Place 2 application onto the skin daily.  Previous Medications   ASPIRIN 81 MG TABLET    Take 81 mg by mouth daily.     NAPROXEN SODIUM (ALEVE)  220 MG TABLET    As needed for pain   Modified Medications   Modified Medication Previous Medication   ALFUZOSIN (UROXATRAL) 10 MG 24 HR TABLET alfuzosin (UROXATRAL) 10 MG 24 hr tablet      Take 1 tablet (10 mg total) by mouth daily.    Take 1 tablet (10 mg total) by mouth daily.   AMLODIPINE (NORVASC) 10 MG TABLET amLODipine (NORVASC) 10 MG tablet      Take 1 tablet (10 mg total) by mouth daily.    Take 1 tablet (10 mg total) by mouth daily.   CHLORPHENIRAMINE-HYDROCODONE (TUSSIONEX) 10-8 MG/5ML LQCR chlorpheniramine-HYDROcodone (TUSSIONEX) 10-8 MG/5ML LQCR      Take 5 mLs by mouth every 12 (twelve) hours.    TAKE BY MOUTH EVERY 12 HOURS AS NEEDED   FUROSEMIDE (LASIX) 40 MG TABLET furosemide (LASIX) 40 MG tablet      Take 1 tablet (40 mg total) by mouth daily.    Take 1 tablet (40 mg total) by mouth daily.   GLIMEPIRIDE (AMARYL) 4 MG TABLET glimepiride (AMARYL) 4 MG tablet      Take 1 tablet (4 mg total) by mouth 2 (two) times daily.    Take 1 tablet (4 mg total) by mouth 2 (two) times daily.   INSULIN GLARGINE (LANTUS SOLOSTAR) 100 UNIT/ML INJECTION insulin glargine (LANTUS SOLOSTAR) 100 UNIT/ML injection      Inject 70 Units into the skin  every morning.    Inject 70 Units into the skin every morning.   INSULIN PEN NEEDLE 31G X 8 MM MISC Insulin Pen Needle 31G X 8 MM MISC      Use once daily as directed    Use once daily as directed   LISINOPRIL (PRINIVIL,ZESTRIL) 40 MG TABLET lisinopril (PRINIVIL,ZESTRIL) 40 MG tablet      Take 1 tablet (40 mg total) by mouth daily.    Take 1 tablet (40 mg total) by mouth daily.   METOPROLOL SUCCINATE (TOPROL XL) 25 MG 24 HR TABLET metoprolol succinate (TOPROL XL) 25 MG 24 hr tablet      Take 1 tablet (25 mg total) by mouth daily.    Take 1 tablet (25 mg total) by mouth daily.   PIOGLITAZONE HCL-METFORMIN HCL 15-1000 MG TB24 Pioglitazone HCl-Metformin HCl 15-1000 MG TB24      Take 1 tablet by mouth 2 (two) times daily.    Take 1 tablet by mouth 2 (two) times daily.   ROSUVASTATIN (CRESTOR) 5 MG TABLET rosuvastatin (CRESTOR) 5 MG tablet      Take 1 tablet (5 mg total) by mouth at bedtime.    Take 1 tablet (5 mg total) by mouth at bedtime.   SAXAGLIPTIN HCL (ONGLYZA) 5 MG TABS TABLET saxagliptin HCl (ONGLYZA) 5 MG TABS tablet      Take 1 tablet (5 mg total) by mouth daily.    Take 1 tablet (5 mg total) by mouth daily.  Discontinued Medications   FLUOCINONIDE-EMOLLIENT (LIDEX-E) 0.05 % CREAM    Apply to area as directed   TESTOSTERONE (ANDROGEL PUMP) 1.25 GM/ACT (1%) GEL    Place 5 g onto the skin daily. 2 pumps per day, 5.0 gms once a day

## 2012-07-19 NOTE — Telephone Encounter (Signed)
Rec'd from [facility] forward 3 pages to Dr.Nadel

## 2012-07-20 ENCOUNTER — Encounter: Payer: Self-pay | Admitting: Gastroenterology

## 2012-09-27 ENCOUNTER — Other Ambulatory Visit: Payer: Self-pay | Admitting: Pulmonary Disease

## 2012-10-06 ENCOUNTER — Encounter: Payer: Managed Care, Other (non HMO) | Admitting: Gastroenterology

## 2013-01-03 ENCOUNTER — Telehealth: Payer: Self-pay | Admitting: Pulmonary Disease

## 2013-01-03 MED ORDER — INSULIN PEN NEEDLE 31G X 8 MM MISC: Status: DC

## 2013-01-03 NOTE — Telephone Encounter (Signed)
Pt is needing refill on insulin needles. I have sent how was last refilled to [place]. Nothing further needed

## 2013-01-17 ENCOUNTER — Ambulatory Visit (INDEPENDENT_AMBULATORY_CARE_PROVIDER_SITE_OTHER): Payer: Managed Care, Other (non HMO) | Admitting: Pulmonary Disease

## 2013-01-17 ENCOUNTER — Other Ambulatory Visit (INDEPENDENT_AMBULATORY_CARE_PROVIDER_SITE_OTHER): Payer: Managed Care, Other (non HMO)

## 2013-01-17 ENCOUNTER — Encounter: Payer: Self-pay | Admitting: Pulmonary Disease

## 2013-01-17 VITALS — BP 138/62 | HR 78 | Temp 98.4°F | Ht 70.0 in | Wt 296.4 lb

## 2013-01-17 DIAGNOSIS — G4733 Obstructive sleep apnea (adult) (pediatric): Secondary | ICD-10-CM

## 2013-01-17 DIAGNOSIS — E119 Type 2 diabetes mellitus without complications: Secondary | ICD-10-CM

## 2013-01-17 DIAGNOSIS — I2510 Atherosclerotic heart disease of native coronary artery without angina pectoris: Secondary | ICD-10-CM

## 2013-01-17 DIAGNOSIS — E669 Obesity, unspecified: Secondary | ICD-10-CM

## 2013-01-17 DIAGNOSIS — E785 Hyperlipidemia, unspecified: Secondary | ICD-10-CM

## 2013-01-17 DIAGNOSIS — Z23 Encounter for immunization: Secondary | ICD-10-CM

## 2013-01-17 DIAGNOSIS — I10 Essential (primary) hypertension: Secondary | ICD-10-CM

## 2013-01-17 LAB — BASIC METABOLIC PANEL
CO2: 30 mEq/L (ref 19–32)
Calcium: 9.4 mg/dL (ref 8.4–10.5)
Creatinine, Ser: 1 mg/dL (ref 0.4–1.5)
GFR: 77.67 mL/min (ref >60.00)
Glucose, Bld: 118 mg/dL — ABNORMAL HIGH (ref 70–99)
Sodium: 140 mEq/L (ref 135–145)

## 2013-01-17 MED ORDER — METFORMIN HCL 1000 MG PO TABS: 1000.0000 mg | ORAL_TABLET | Freq: Two times a day (BID) | ORAL | Status: DC

## 2013-01-17 NOTE — Progress Notes (Signed)
Subjective:    Patient ID: [name] [name], male    DOB: 1950/09/01, 62 y.o.   MRN: [id number]  HPI 62 y/o WM here for f/u of mult medical problems including OSA, HBP, DM w/ neuropathy, Obesity, & other problems listed below... he is a personal friend of DrGamble, DrClance & the brother-in-law of Brett Canales Minor.  ~  October 14, 2010:  29mo ROV & he feels that he is doing satis, but unfortunately has not lost any weight> he is again referred to the [facility] Bariat[facility] program & he will inquire... He tells me that his company pays for a health coach to check his meds & they scared him... We reviewed the indication for a follow up Cards eval due to mult risk factors... We reviewed labs & XRays from 11/11> f/u BS=176, A1c=7.1 (on Lantus, Actos+Met, & Glimep)... See prob list below>>  ~  July 07, 2011:  8-69mo ROV & CPX> see prob list below>>    He saw DrCrenshaw 10/12> he reviewed prev work up w/ 2DEcho, Myoview, Cath; he needs vigorous exercise program & wt reduction...    BP controlled on ToprolXL25, Norvasc10, Lisinopril40, & Lasix40; BP= 144/78 & he denies CP, palpit, dizzy, ch in SOB/DOE, edema, etc...    He has been off all statins & says he feels better> DrCrenshaw changed Simva40 to Prav40 due to Norvasc but pt just stopped them all 9/12 due to aching in his muscles; FLP on diet alone showed TChol 197, TG 174, HDL 42, LDL 121; he has agreed to try Crestor5mg /d at this time...    His DM meds include: Lantus70Qam, Actos+Met 15-1000 Bid, Amaryl4mg Bid, Onglyza5mg /d; BS=97, A1c=7.1; rec to continue same meds, get on diet, get weight down...    His weight is 284# which is down 5# over the last 81mo; he is not in favor of Bariatric surg & has declined to call [facility] for their intake session; we reviewed diet & exercise prescriptions...    He has seen DrOttelin in the past for Urology w/ Low-T, ED, & BPH; he is on TESTIM 5gm/d & Uroxatrol10mg /d; Testos level= 318 & PSA= 0.67; he notes that energy level & drive seem  ok to him therefore no changes made in his medication. LABS 2/13:  FLP- not at goals on diet alone;  Chems- ok w/ BS=97 A1c=7.1;  CBC- wnl;  TSH=2.59, PSA=0.67;  Testos=318  ~  July 19, 2012:  Yearly ROV & CPX>  Raymel says he is feeling OK but has had a stressful yr- wife Dx & Rx for Breast cancer, he has been unable to lose wt & has gained 16# to 300# today w/ 2+pitting edema;  He tells me he stopped the Lasix40 due to excessive urination, and his BP is up to 170/88 today as a result...  We reviewed the following medical problems during today's office visit >>     OSA> on CPAP & followed by DrClance; he is 100% compliant & can't sleep w/o it; rests well, awakens at 4:30 every day- feels rested and denies daytime hypersomnolence etc; his CC= congestion & chest discomfort when he arises but this rapidly goes away when up & about...    HBP> on MeptopER25, Amlod10, Lisin40, Lasix40; known modLVH on 2DEcho 2012; admits to NOT taking the Lasix in months due to excess urination; in the meanwhile he has gained 16#, has VI & pitting edema, and BP= 170/88; we discussed the need for taking all meds every day, 2Gm Na diet, Lasix40 every day, &  monitor BP at home...    CAD> on above + ASA81; known non-obstructive CAD on cath 2012; prev cardiac eval by DrCrenshaw- mult risk factors; he is too sedentary, denies CP/angina/ palpit/ ch in SOB; asked to join the Y or a gym for trainer to help w/ exercise program...    VI, Edema> we reviewed the critically important nature of NO SALT, elevation, support hose & take the Lasix40 every day...     Hyperlipid> on Crestor5; tolerating the low dose statin very well & FLP much improved over diet alone; FLP 3/14 shows TChol 145, TG 77, HDL 47, LDL 83    DM-II> on Lantus70u, Actos+Met15-1000Bid, Glimep4Bid, Ongly5; labs 3/14 showed BS=139, A1c=7.3; Rec to continue same meds, get on diet, get wt down!    Obesity> wt=300# up 16# in the last yr (BMI~45); we reviewed diet, exercise,  wt reduction strategies- he has been referred to [facility] for their Bariatric program but he tells me his wife refuses to let him consider this operation...    Divertics> known divertics & asymptomatic w/o abd pain, n/v, c/d, blood seen; he is due for f/u colonoscopy w/ DrKaplan & we will refer...    Hypogonad> he stopped his topical prep last yr; he has seen DrOttelin; notes that his energy is fair & drive is diminished; we decided to Rx w/ Androgel 1.62%- 2pumps & recheck level in 2-85mo before considering incr dose or shots...    DDD- Cspine> aware, uses aleve as needed We reviewed prob list, meds, xrays and labs> see below for updates >>  LABS 3/14:  FLP- at goals on Cres5;  Chems- ok x BUN=27 (GFR is 92), BS=139 A1c=7.3;  CBC- wnl;  TSH=1.81;  PSA=0.50;  Testos=257;  Umicroalb=pos   ~  January 17, 2013:  21mo ROV & [name] has still been unable to lose weight; c/o incr cost of meds & we made adjustment below; We reviewed the following medical problems during today's office visit >>     OSA> on CPAP & followed by DrClance; he is 100% compliant & can't sleep w/o it; rests well, awakens at 4:30 every day- feels rested and denies daytime hypersomnolence etc; he is overdue for Sleep f/u w/ drClance...    HBP> on MeptopER25, Amlod10, Lisin40, Lasix40; known modLVH on 2DEcho 2012; he restarted his Lasix daily but has only lost 3#; BP= 138/62 & he denies CP, palpit, dizzy, ch in SOB/DOE, edema...    CAD> on above + ASA81; known non-obstructive CAD on cath 2012; prev cardiac eval by DrCrenshaw- mult risk factors; he is too sedentary, denies CP/angina/ palpit/ ch in SOB; asked to join the Y to help w/ exercise program...    VI, Edema> we reviewed the critically important nature of NO SALT, elevation, support hose & take the Lasix40 every day...     Hyperlipid> on Crestor5; tolerating the low dose statin very well & FLP much improved over diet alone; FLP 3/14 showed TChol 145, TG 77, HDL 47, LDL 83    DM-II> on  Lantus70u, Actos+Met15-1000Bid, Glimep4Bid, Ongly5; labs 9/14 show BS=118, A1c=7.5; He needs to save $$- rec stop Actos+Met & switch to Metform1000Bid; must diet & get wt down!    Obesity> wt=296# (BMI~45); we reviewed diet, exercise, wt reduction strategies- he has been referred to [facility] for their Bariatric program but he tells me his wife refuses to let him consider this option.    Divertics> known divertics & asymptomatic w/o abd pain, n/v, c/d, blood seen; he is due  for f/u colonoscopy w/ DrKaplan & we will refer...    Hypogonad> he stopped his topical prep again (never did ret for f/u Testos level on the pump); on Uroxatrol10 for bladder; he has seen DrOttelin; notes that his energy is OK & does not think that he is symptomatic enough to warrant therapy for this problem...    DDD- Cspine>  He had some left hip discomfort but he toughed it out w/ Aleve & Flexeril- now resolved... We reviewed prob list, meds, xrays and labs> see below for updates >> OK FLU vaccine today & he wants refill Tussionex... LABS 9/14:  Chems- ok w/ BS=118 A1c=7.5.Marland KitchenMarland Kitchen           Problem List:  OBSTRUCTIVE SLEEP APNEA (ICD-327.23) - followed by DrClance & wears CPAP compliantly...  ~  he had a sleep study in 1995 (wt=235#) w/ 53 events/hr & desat to 83%- started on CPAP... ~  9/11:  f/u OV w/ DrClance  (wt=290#) and he ordered new CPAP machine for [name], last autotitrate showed optimal pressure= 13cmH2O. ~  3/14: on CPAP & followed by DrClance; he is 100% compliant & can't sleep w/o it; rests well, awakens at 4:30 every day- feels rested and denies daytime hypersomnolence etc; his CC= congestion & chest discomfort when he arises but this rapidly goes away when up & about.  HYPERTENSION (ICD-401.9) - on TOPROL XL 25mg /d, AMLODIPINE 10mg /d, LISINOPRIL 40mg /d, & FUROSEMIDE 40mg /d... ~  6/12:  BP= 150/80 & similar at home; denies HA, visual changes, CP, palipit, dizziness, syncope, dyspnea, edema, etc; he knows to elim sodium  from diet & lose weight to control BP better... ~  CXR 9/12 showed mild cardiomeg- stable, clear lungs, mild DJD in spine... ~  3/14: on MeptopER25, Amlod10, Lisin40, Lasix40; known modLVH on 2DEcho 2012; admits to NOT taking the Lasix in months due to excess urination; in the meanwhile he has gained 16#, has VI & pitting edema, and BP= 170/88; we discussed the need for taking all meds every day, 2Gm Na diet, Lasix40 every day, & monitor BP at home. ~  9/14: on MeptopER25, Amlod10, Lisin40, Lasix40; known modLVH on 2DEcho 2012; he restarted his Lasix daily but has only lost 3#; BP= 138/62 & he denies CP, palpit, dizzy, ch in SOB/DOE, edema.  CARDIAC screening eval & subsequent eval for dyspnea>> seen by DrCrenshaw... ~  Myoview 7/06 showed apical thinning, no ischemia, EF= 66% & normal wall motion. ~  EKG shows NSR, WNL... ~  2DEcho 7/12 showed incr wall thickness c/w modLVH, norm sys function w/ EF=60-65%, mild LAdil, PAsys~70mmHg est.. ~  Myoview 7/12 showed only fair exerc capacity (verySOB), hypertensive BP response, no CP, no ST changes, normal perfusion & EF=60% ~  Subsequent cath 9/12 by DrArida showed nonobstructive CAD in LAD & RCA, normal LVF w/ EF=65%, LVEDP=22 but right heart pressures were WNL & no pulmHTN... ~  3/14: on above + ASA81; known non-obstructive CAD on cath 2012; prev cardiac eval by DrCrenshaw- mult risk factors; he is too sedentary, denies CP/angina/ palpit/ ch in SOB; asked to join the Y or a gym for trainer to help w/ exercise program.  VENOUS INSUFFICIENCY & EDEMA >> on low sodium diet & LASIX40mg /d... ~  3/14:  we reviewed the critically important nature of NO SALT, elevation, support hose & take the Lasix40 every day.  HYPERLIPIDEMIA (ICD-272.4) - prev on Simva40 but it was changed to Prav40 9/12 due to Norvasc restriction; he decided on his own to stop all  statin meds due to musc aching which improved off the med; f/u FLP 2/13 not at goals on diet alone & he agreed to  start Crestor5mg /d...  ~  FLP 12/09 showed TChol 124, TG 108, HDL 39, LDL 64 ~  FLP 11/10 showed TChol 145, TG 76, HDL 43, LDL 86 ~  FLP 11/11 showed TChol 148, TG 91, HDL 47, LDL 83 ~  FLP 7/12 on Simva40 showed TChol 144, TG 83, HDL 49, LDL 78 ~  9/12: DrCrenshaw switched him to Prav40, but he stopped all statins due to aching... ~  FLP 2/13 on diet alone showed TChol 197, TG 174, HDL 42, LDL 121... He has agreed to take Cres5. ~  FLP 3/14 on Cres5 showed TChol 145, TG 77, HDL 47, LDL 83  DIABETES MELLITUS, TYPE II (ICD-250.00) - on LANTUS 70u daily,  ACTOS+MET 1000-15 Bid, GLIMEPIRIDE 4mg Bid & ONGLYZA 5mg /d...  ~  his Urine microalb has been +pos, w/ norm BUN & Creat... ~  labs 12/09 showed BS= 86, A1c= 8.2 ~  labs 11/10 showed BS= 162, A1c= 8.5 ~  labs 11/11 showed BS= 114, A1c= 6.8 ~  Labs 6/12 showed BS= 176, A1c= 7.1... We discussed the benefits to his DM from Bariatric surg. ~  Labs 2/13 on Lantus70+110meds showed BS= 97, A1c= 7.1.Marland KitchenMarland Kitchen Continue same, get on diet, get wt down, watch BS. ~  3/14: on Lantus70u, Actos+Met15-1000Bid, Glimep4Bid, Ongly5; labs 3/14 showed BS=139, A1c=7.3; Rec to continue same meds, get on diet, get wt down!  ~  9/14: on Lantus70u, Actos+Met15-1000Bid, Glimep4Bid, Ongly5; labs 9/14 show BS=118, A1c=7.5; He needs to save $$- rec stop Actos+Met & switch to Metform1000Bid; must diet & get wt down!  EXOGENOUS OBESITY (ICD-278.00) - his weight has been consistantly in the 270-290# range x yrs... we discussed low carb/ low fat diet & exercise program needed to result in substantial weight loss... ~  weight 12/09 = 279#,  68" tall,  BMI= 42.5 ~  weight 11/10 = 276#, 68" tall,  BMI= 42 ~  weight 9/11 = 290#,  68" tall,  BMI= 44... we discussed Bariatric surgery. ~  weight 12/11 = 290# ~  Weight 6/12 = 289# and he is encouraged to call [facility] regarding their Bariatric program. ~  Weight 2/13 = 284# and he is not interested in Bariatric surg... ~  Weight 3/14 = 300# and he  tells me that his wife won't let him consider Bariatric surg... ~  Weight 9/14 = 296#  DIVERTICULOSIS OF COLON (ICD-562.10) ~  Colonoscopy 3/03 by Dorris Singh showed divertics, otherw neg... f/u planned 8yrs. ~  CT scan of abd 1/04 showed diverticulosis... ~  3/14 & 9/14:  referred to [facility] for f/u colonoscopy...  TESTICULAR HYPOFUNCTION (ICD-257.2) - he has prev been on Testim, but on & off medication due to insurance coverage problems... ~  labs 7/10 showed Testosterone level = 189 (350-890) ~  labs 12/10 showed Testosterone level = 231 ~  10/11:  eval by DrOttelin for Hypogonadism, ED, BPH> Rx w/ ANDROGEL 5gm/d, LEVITRA 20mg  Prn, UROXATROL 10mg /d... ~  11/11:  labs here showed Testosterone level = 327, PSA= 0.56 ~  Labs 2/13 showed Testos level = 318, PSA= 0.67 ~  Labs 3/14 off Testos replacement showed Testos level = 257, PSA= 0.50; Rec- restart ANDROGEL 1.62%- 2 pumps... ~  9/14:  He stopped Androgel on his own (again) & notes energy is good, feeling well, doesn't think he is symptomatic...  ERECTILE DYSFUNCTION, ORGANIC (ICD-607.84) - uses  VIAGRA 100mg  vs LEVITRA 20mg .  DISC DISEASE, CERVICAL (ICD-722.4) - he is s/p 2 separate cervical operations from DrHirsh in 2008 & 2009  BURSITIS, LEFT SHOULDER (ICD-726.10) - he uses OTC anti-inflamm medications Prn...  Hx of ANEMIA (ICD-285.9) - he takes MVI daily... ~  labs 12/09 (after [facility]Spine surg) showed Hg= 10.9, MCV= 77 ~  labs 11/10 showed Hg= 12.3, MCV= 79 ~  labs 11/11 showed Hg= 12.6, MCV= 80 ~  Labs 2/13 showed Hg= 13.6, MCV= 80 ~  Labs 3/14 showed Hg= 13.0, MCV=79   Past Surgical History  Procedure Laterality Date  . Posterior laminectomy / decompression cervical spine      c5-6 fusion 12/08 Dr. Blanche East  . S/p cervical disc surgery and fusion  10/09    Dr. Blanche East    Outpatient Encounter Prescriptions as of 01/17/2013  Medication Sig Dispense Refill  . alfuzosin (UROXATRAL) 10 MG 24 hr tablet Take 1 tablet (10 mg total) by  mouth daily.  90 tablet  3  . amLODipine (NORVASC) 10 MG tablet Take 1 tablet (10 mg total) by mouth daily.  90 tablet  3  . aspirin 81 MG tablet Take 81 mg by mouth daily.        . chlorpheniramine-HYDROcodone (TUSSIONEX) 10-8 MG/5ML LQCR TAKE 5 ML EVERY 12 HOURS AS NEEDED  140 mL  1  . furosemide (LASIX) 40 MG tablet Take 1 tablet (40 mg total) by mouth daily.  90 tablet  3  . glimepiride (AMARYL) 4 MG tablet Take 1 tablet (4 mg total) by mouth 2 (two) times daily.  180 tablet  3  . insulin glargine (LANTUS SOLOSTAR) 100 UNIT/ML injection Inject 70 Units into the skin every morning.  30 mL  3  . Insulin Pen Needle 31G X 8 MM MISC Use once daily as directed  100 each  12  . lisinopril (PRINIVIL,ZESTRIL) 40 MG tablet Take 1 tablet (40 mg total) by mouth daily.  90 tablet  3  . metoprolol succinate (TOPROL XL) 25 MG 24 hr tablet Take 1 tablet (25 mg total) by mouth daily.  90 tablet  3  . naproxen sodium (ALEVE) 220 MG tablet As needed for pain       . Pioglitazone HCl-Metformin HCl 15-1000 MG TB24 Take 1 tablet by mouth 2 (two) times daily.  180 tablet  3  . rosuvastatin (CRESTOR) 5 MG tablet Take 1 tablet (5 mg total) by mouth at bedtime.  90 tablet  3  . saxagliptin HCl (ONGLYZA) 5 MG TABS tablet Take 1 tablet (5 mg total) by mouth daily.  90 tablet  3  . Testosterone (ANDROGEL) 20.25 MG/1.25GM (1.62%) GEL Place 2 application onto the skin daily.  3.75 g  3   No facility-administered encounter medications on file as of 01/17/2013.    Allergies  Allergen Reactions  . Amoxicillin     REACTION: unspecified  . Levofloxacin     REACTION: unspecified  . Penicillins     Current Medications, Allergies, Past Medical History, Past Surgical History, Family History, and Social History were reviewed in [employer] record.   Review of Systems        The patient complains of fatigue, chest discomfort, dyspnea on exertion, and stiffness in joints.  The patient denies fever,  chills, sweats, anorexia, weakness, malaise, weight loss, sleep disorder, blurring, diplopia, eye irritation, eye discharge, vision loss, eye pain, photophobia, earache, ear discharge, tinnitus, decreased hearing, nasal congestion, nosebleeds, sore throat, hoarseness, syncope, orthopnea,  PND, peripheral edema, cough, dyspnea at rest, excessive sputum, hemoptysis, wheezing, pleurisy, nausea, vomiting, diarrhea, constipation, change in bowel habits, abdominal pain, melena, hematochezia, jaundice, gas/bloating, indigestion/heartburn, dysphagia, odynophagia, dysuria, hematuria, urinary frequency, urinary hesitancy, nocturia, incontinence, back pain, joint pain, joint swelling, muscle cramps, muscle weakness, arthritis, sciatica, restless legs, leg pain at night, leg pain with exertion, rash, itching, dryness, suspicious lesions, paralysis, paresthesias, seizures, tremors, vertigo, transient blindness, frequent falls, frequent headaches, difficulty walking, depression, anxiety, memory loss, confusion, cold intolerance, heat intolerance, polydipsia, polyphagia, polyuria, unusual weight change, abnormal bruising, bleeding, enlarged lymph nodes, urticaria, allergic rash, hay fever, and recurrent infections.    Objective:   Physical Exam     WD, Obese, 62 y/o WM in NAD... GENERAL:  Alert & oriented; pleasant & cooperative... HEENT:  [place]/AT, EOM-wnl, PERRLA, EACs-clear, TMs-wnl, NOSE-clear, THROAT-clear & wnl. NECK:  Supple w/ fairROM; no JVD; normal carotid impulses w/o bruits; no thyromegaly or nodules palpated; no lymphadenopathy. Scar of his ant cerv disc surg... CHEST:  Clear to P & A; without wheezes/ rales/ or rhonchi. HEART:  Regular Rhythm; without murmurs/ rubs/ or gallops. ABDOMEN:  Obese, soft & nontender; normal bowel sounds; no organomegaly or masses detected. EXT: without deformities, mild arthritic changes; no varicose veins/ +venous insuffic & 2+pitting edema. NEURO:  CN's intact; motor testing  normal; sensory testing normal; gait normal & balance OK. DERM:  No lesions noted; no rash etc...  RADIOLOGY DATA:  Reviewed in the EPIC EMR & discussed w/ the patient...  LABORATORY DATA:  Reviewed in the EPIC EMR & discussed w/ the patient...   Assessment & Plan:    OSA>  Followed by DrClance; he uses his CPAP nightly; denies daytime hypersomnolence etc...  HBP>  Controlled on his 4 meds, & we stressed diet, exercise, no salt, wt reduction...  HYPERCHOLESTEROLEMIA>  Much improved & now at goal on Cres5, continue same...  DM>  On Lantus, Glimep, Actos+Met, Onglyza... Control is fair w/ A1c=7.5 & very weight dependent; he wants to save $$ therefore stop Actos+Met & use Metform1000Bid...  OBESITY>  Weight reduction is critical to his health & longevity... He has been rec & referred to Bariatric surg program at [facility] but he says his wife will not let him have this surg...  Divertics>  He is due for colonoscopy & we will refer...  Testic Hypofunction> Followed by DrOttelin for Urology; he stopped the hormone replacement Rx again & states that he is feeling fine...  Other medical issues as noted...   Patient's Medications  New Prescriptions   METFORMIN (GLUCOPHAGE) 1000 MG TABLET    Take 1 tablet (1,000 mg total) by mouth 2 (two) times daily with a meal.  Previous Medications   ALFUZOSIN (UROXATRAL) 10 MG 24 HR TABLET    Take 1 tablet (10 mg total) by mouth daily.   AMLODIPINE (NORVASC) 10 MG TABLET    Take 1 tablet (10 mg total) by mouth daily.   ASPIRIN 81 MG TABLET    Take 81 mg by mouth daily.     CHLORPHENIRAMINE-HYDROCODONE (TUSSIONEX) 10-8 MG/5ML LQCR    TAKE 5 ML EVERY 12 HOURS AS NEEDED   FUROSEMIDE (LASIX) 40 MG TABLET    Take 1 tablet (40 mg total) by mouth daily.   GLIMEPIRIDE (AMARYL) 4 MG TABLET    Take 1 tablet (4 mg total) by mouth 2 (two) times daily.   INSULIN GLARGINE (LANTUS SOLOSTAR) 100 UNIT/ML INJECTION    Inject 70 Units into the skin every morning.  INSULIN  PEN NEEDLE 31G X 8 MM MISC    Use once daily as directed   LISINOPRIL (PRINIVIL,ZESTRIL) 40 MG TABLET    Take 1 tablet (40 mg total) by mouth daily.   METOPROLOL SUCCINATE (TOPROL XL) 25 MG 24 HR TABLET    Take 1 tablet (25 mg total) by mouth daily.   NAPROXEN SODIUM (ALEVE) 220 MG TABLET    As needed for pain    ROSUVASTATIN (CRESTOR) 5 MG TABLET    Take 1 tablet (5 mg total) by mouth at bedtime.   SAXAGLIPTIN HCL (ONGLYZA) 5 MG TABS TABLET    Take 1 tablet (5 mg total) by mouth daily.   TESTOSTERONE (ANDROGEL) 20.25 MG/1.25GM (1.62%) GEL    Place 2 application onto the skin daily.  Modified Medications   No medications on file  Discontinued Medications   PIOGLITAZONE HCL-METFORMIN HCL 15-1000 MG TB24    Take 1 tablet by mouth 2 (two) times daily.

## 2013-01-17 NOTE — Patient Instructions (Addendum)
Today we updated your med list in our EPIC system...    Continue your current medications the same...  We decided to change your Actos+Met to plain METFORMIN 1000mg  twice daily to save $$$    We will want to assess your level of DM control so be sure to monitor your BS at home...  Today we did your follow up DM labs...    We will contact you w/ the results when available...   Let's get on track w/ your diet & exercise program...  Call for any questions...  Let's plan a follow up visit in 23mo, sooner if needed for problems.Marland KitchenMarland Kitchen

## 2013-01-23 ENCOUNTER — Encounter: Payer: Self-pay | Admitting: Gastroenterology

## 2013-03-14 ENCOUNTER — Telehealth: Payer: Self-pay | Admitting: Pulmonary Disease

## 2013-03-14 NOTE — Telephone Encounter (Signed)
I spoke with pt. He is requesting a refill on tussionex cough syrup. Last refilled 09/27/12 #140 ML x 1 refill--Pt c/o constant hacking cough for several weeks now.  Last OV 01/17/13 Pending 07/18/13 Pt wants to pick this up in the AM Please advise Dr. Kriste Basque thanks

## 2013-03-15 MED ORDER — HYDROCOD POLST-CHLORPHEN POLST 10-8 MG/5ML PO LQCR: ORAL | Status: DC

## 2013-03-15 NOTE — Telephone Encounter (Signed)
rx has been printed out and i will leave this up front for the pt to come by and pick up.  thanks

## 2013-03-15 NOTE — Telephone Encounter (Signed)
PT informed that rx is at front desk for pick up.

## 2013-03-28 ENCOUNTER — Ambulatory Visit (AMBULATORY_SURGERY_CENTER): Payer: Self-pay | Admitting: *Deleted

## 2013-03-28 VITALS — Ht 69.0 in | Wt 291.8 lb

## 2013-03-28 DIAGNOSIS — Z1211 Encounter for screening for malignant neoplasm of colon: Secondary | ICD-10-CM

## 2013-03-28 MED ORDER — NA SULFATE-K SULFATE-MG SULF 17.5-3.13-1.6 GM/177ML PO SOLN: 1.0000 | Freq: Once | ORAL | Status: DC

## 2013-03-28 NOTE — Progress Notes (Signed)
No allergies to eggs or soy. No problems with anesthesia.  

## 2013-04-02 ENCOUNTER — Encounter: Payer: Self-pay | Admitting: Gastroenterology

## 2013-04-10 ENCOUNTER — Ambulatory Visit (AMBULATORY_SURGERY_CENTER): Payer: Managed Care, Other (non HMO) | Admitting: Gastroenterology

## 2013-04-10 ENCOUNTER — Encounter: Payer: Self-pay | Admitting: Gastroenterology

## 2013-04-10 VITALS — BP 162/86 | HR 83 | Temp 98.0°F | Resp 12 | Ht 69.0 in | Wt 291.0 lb

## 2013-04-10 DIAGNOSIS — Z1211 Encounter for screening for malignant neoplasm of colon: Secondary | ICD-10-CM

## 2013-04-10 DIAGNOSIS — K5730 Diverticulosis of large intestine without perforation or abscess without bleeding: Secondary | ICD-10-CM

## 2013-04-10 DIAGNOSIS — K648 Other hemorrhoids: Secondary | ICD-10-CM

## 2013-04-10 MED ORDER — HYOSCYAMINE SULFATE 0.125 MG SL SUBL: 0.2500 mg | SUBLINGUAL_TABLET | SUBLINGUAL | Status: DC | PRN

## 2013-04-10 MED ORDER — SODIUM CHLORIDE 0.9 % IV SOLN: 500.0000 mL | INTRAVENOUS | Status: DC

## 2013-04-10 NOTE — Progress Notes (Signed)
Lidocaine-40mg IV prior to Propofol InductionPropofol given over incremental dosages 

## 2013-04-10 NOTE — Progress Notes (Signed)
Patient did not experience any of the following events: a burn prior to discharge; a fall within the facility; wrong site/side/patient/procedure/implant event; or a hospital transfer or hospital admission upon discharge from the facility. ([id number]) Patient did not have preoperative order for IV antibiotic SSI prophylaxis. ([id number])  

## 2013-04-10 NOTE — Patient Instructions (Signed)
YOU HAD AN ENDOSCOPIC PROCEDURE TODAY AT THE [facility]: Refer to the procedure report that was given to you for any specific questions about what was found during the examination.  If the procedure report does not answer your questions, please call your gastroenterologist to clarify.  If you requested that your care partner not be given the details of your procedure findings, then the procedure report has been included in a sealed envelope for you to review at your convenience later.  YOU SHOULD EXPECT: Some feelings of bloating in the abdomen. Passage of more gas than usual.  Walking can help get rid of the air that was put into your GI tract during the procedure and reduce the bloating. If you had a lower endoscopy (such as a colonoscopy or flexible sigmoidoscopy) you may notice spotting of blood in your stool or on the toilet paper. If you underwent a bowel prep for your procedure, then you may not have a normal bowel movement for a few days.  DIET: Your first meal following the procedure should be a light meal and then it is ok to progress to your normal diet.  A half-sandwich or bowl of soup is an example of a good first meal.  Heavy or fried foods are harder to digest and may make you feel nauseous or bloated.  Likewise meals heavy in dairy and vegetables can cause extra gas to form and this can also increase the bloating.  Drink plenty of fluids but you should avoid alcoholic beverages for 24 hours.  ACTIVITY: Your care partner should take you home directly after the procedure.  You should plan to take it easy, moving slowly for the rest of the day.  You can resume normal activity the day after the procedure however you should NOT DRIVE or use heavy machinery for 24 hours (because of the sedation medicines used during the test).    SYMPTOMS TO REPORT IMMEDIATELY: A gastroenterologist can be reached at any hour.  During normal business hours, 8:30 AM to 5:00 PM Monday through Friday,  call [phone].  After hours and on weekends, please call the GI answering service at [phone] who will take a message and have the physician on call contact you.   Following lower endoscopy (colonoscopy or flexible sigmoidoscopy):  Excessive amounts of blood in the stool  Significant tenderness or worsening of abdominal pains  Swelling of the abdomen that is new, acute  Fever of 100F or higher  FOLLOW UP: If any biopsies were taken you will be contacted by phone or by letter within the next 1-3 weeks.  Call your gastroenterologist if you have not heard about the biopsies in 3 weeks.  Our staff will call the home number listed on your records the next business day following your procedure to check on you and address any questions or concerns that you may have at that time regarding the information given to you following your procedure. This is a courtesy call and so if there is no answer at the home number and we have not heard from you through the emergency physician on call, we will assume that you have returned to your regular daily activities without incident.  SIGNATURES/CONFIDENTIALITY: You and/or your care partner have signed paperwork which will be entered into your electronic medical record.  These signatures attest to the fact that that the information above on your After Visit Summary has been reviewed and is understood.  Full responsibility of the confidentiality of this   discharge information lies with you and/or your care-partner.  Recommendations See procedure report  

## 2013-04-10 NOTE — Op Note (Signed)
[facility] [address] [place], [address]   COLONOSCOPY PROCEDURE REPORT  PATIENT: [name], [name]  MR#: [id number] BIRTHDATE: 09/01/1950 , 62  yrs. old GENDER: Male ENDOSCOPIST: Louis Meckel, MD REFERRED BY: PROCEDURE DATE:  04/10/2013 PROCEDURE:   Colorectal cancer screening - average risk patient First Screening Colonoscopy - Avg.  risk and is 50 yrs.  old or older - No.  Prior Negative Screening - Now for repeat screening. 10 or more years since last screening  History of Adenoma - Now for follow-up colonoscopy & has been > or = to 3 yrs.  N/A  Polyps Removed Today? No.  Recommend repeat exam, <10 yrs? No. ASA CLASS:   Class II INDICATIONS:Average risk patient for colon cancer. MEDICATIONS: MAC sedation, administered by CRNA and propofol (Diprivan) 250mg  IV  DESCRIPTION OF PROCEDURE:   After the risks benefits and alternatives of the procedure were thoroughly explained, informed consent was obtained.  A digital rectal exam revealed no abnormalities of the rectum.   The LB CF-H180AL Loaner [id number] endoscope was introduced through the anus and advanced to the cecum, which was identified by both the appendix and ileocecal valve. No adverse events experienced.   The quality of the prep was Suprep good  The instrument was then slowly withdrawn as the colon was fully examined.      COLON FINDINGS: Mild diverticulosis was noted in the sigmoid colon. Internal hemorrhoids were found.   The colon was otherwise normal. There was no diverticulosis, inflammation, polyps or cancers unless previously stated.  Retroflexed views revealed no abnormalities. The time to cecum=2 minutes 02 seconds.  Withdrawal time=10 minutes 0 seconds.  The scope was withdrawn and the procedure completed. COMPLICATIONS: There were no complications.  ENDOSCOPIC IMPRESSION: 1.   Mild diverticulosis was noted in the sigmoid colon 2.   Internal hemorrhoids 3.   The colon was  otherwise normal  RECOMMENDATIONS: Continue current colorectal screening recommendations for "routine risk" patients with a repeat colonoscopy in 10 years.   eSigned:  Louis Meckel, MD 04/10/2013 12:04 PM   cc: Michele Mcalpine, MD and Zelphia Cairo MD   PATIENT NAME:  [name], [name] MR#: [id number]

## 2013-04-11 ENCOUNTER — Telehealth: Payer: Self-pay | Admitting: *Deleted

## 2013-04-11 NOTE — Telephone Encounter (Signed)
  Follow up Call-  Call back number 04/10/2013  Post procedure Call Back phone  # [phone]  Permission to leave phone message Yes    [facility]

## 2013-06-28 ENCOUNTER — Telehealth: Payer: Self-pay | Admitting: Pulmonary Disease

## 2013-06-28 DIAGNOSIS — I10 Essential (primary) hypertension: Secondary | ICD-10-CM

## 2013-06-28 DIAGNOSIS — E785 Hyperlipidemia, unspecified: Secondary | ICD-10-CM

## 2013-06-28 DIAGNOSIS — E291 Testicular hypofunction: Secondary | ICD-10-CM

## 2013-06-28 DIAGNOSIS — D649 Anemia, unspecified: Secondary | ICD-10-CM

## 2013-06-28 DIAGNOSIS — E119 Type 2 diabetes mellitus without complications: Secondary | ICD-10-CM

## 2013-06-28 DIAGNOSIS — F419 Anxiety disorder, unspecified: Secondary | ICD-10-CM

## 2013-06-28 NOTE — Telephone Encounter (Signed)
Last labs done Sept 2014  Please advise on labs thanks!

## 2013-07-04 NOTE — Telephone Encounter (Signed)
Per SN---  Ok to have fasting labs prior to appt with SN.  Order has been placed and pt is aware.

## 2013-07-18 ENCOUNTER — Ambulatory Visit: Payer: Managed Care, Other (non HMO) | Admitting: Pulmonary Disease

## 2013-08-15 ENCOUNTER — Telehealth: Payer: Self-pay | Admitting: Internal Medicine

## 2013-08-15 ENCOUNTER — Ambulatory Visit (INDEPENDENT_AMBULATORY_CARE_PROVIDER_SITE_OTHER): Payer: Managed Care, Other (non HMO) | Admitting: Internal Medicine

## 2013-08-15 ENCOUNTER — Encounter: Payer: Self-pay | Admitting: Internal Medicine

## 2013-08-15 VITALS — BP 120/74 | HR 80 | Temp 98.5°F | Resp 16 | Ht 69.0 in | Wt 285.0 lb

## 2013-08-15 DIAGNOSIS — I10 Essential (primary) hypertension: Secondary | ICD-10-CM

## 2013-08-15 DIAGNOSIS — E119 Type 2 diabetes mellitus without complications: Secondary | ICD-10-CM

## 2013-08-15 DIAGNOSIS — E785 Hyperlipidemia, unspecified: Secondary | ICD-10-CM

## 2013-08-15 MED ORDER — ROSUVASTATIN CALCIUM 20 MG PO TABS: 20.0000 mg | ORAL_TABLET | Freq: Every day | ORAL | Status: DC

## 2013-08-15 MED ORDER — ALFUZOSIN HCL ER 10 MG PO TB24: 10.0000 mg | ORAL_TABLET | Freq: Every day | ORAL | Status: DC

## 2013-08-15 MED ORDER — METFORMIN HCL ER (OSM) 1000 MG PO TB24: 1000.0000 mg | ORAL_TABLET | Freq: Every day | ORAL | Status: DC

## 2013-08-15 MED ORDER — INSULIN PEN NEEDLE 31G X 8 MM MISC
Status: DC
Start: 2013-08-15 — End: 2014-08-24

## 2013-08-15 MED ORDER — FUROSEMIDE 40 MG PO TABS
40.0000 mg | ORAL_TABLET | Freq: Every day | ORAL | Status: DC
Start: 2013-08-15 — End: 2014-11-05

## 2013-08-15 MED ORDER — METOPROLOL SUCCINATE ER 25 MG PO TB24: 25.0000 mg | ORAL_TABLET | Freq: Every day | ORAL | Status: DC

## 2013-08-15 MED ORDER — AMLODIPINE BESYLATE 10 MG PO TABS
10.0000 mg | ORAL_TABLET | Freq: Every day | ORAL | Status: DC
Start: 2013-08-15 — End: 2014-11-05

## 2013-08-15 MED ORDER — LOSARTAN POTASSIUM 100 MG PO TABS: 100.0000 mg | ORAL_TABLET | Freq: Every day | ORAL | Status: DC

## 2013-08-15 MED ORDER — VITAMIN D 1000 UNITS PO TABS: 1000.0000 [IU] | ORAL_TABLET | Freq: Every day | ORAL | Status: AC

## 2013-08-15 MED ORDER — INSULIN GLARGINE 100 UNIT/ML ~~LOC~~ SOLN: 70.0000 [IU] | SUBCUTANEOUS | Status: DC

## 2013-08-15 NOTE — Progress Notes (Signed)
Pre visit review using our clinic review tool, if applicable. No additional management support is needed unless otherwise documented below in the visit note. 

## 2013-08-15 NOTE — Assessment & Plan Note (Signed)
Take Crestor 20 mg 1/4 tab every other day

## 2013-08-15 NOTE — Patient Instructions (Signed)
Take Crestor 20 mg 1/4 tab every other day Take Losartan in place of Lisinopril (cough) Stop Glimepiride Use Amlodipine 1/2 tab a day

## 2013-08-15 NOTE — Assessment & Plan Note (Signed)
Take Crestor 20 mg 1/4 tab every other day Take Losartan in place of Lisinopril (cough) Stop Glimepiride Use Amlodipine 1/2 tab a day  

## 2013-08-15 NOTE — Progress Notes (Signed)
   Subjective:    Patient ID: [name] [name], male    DOB: 1951-02-21, 63 y.o.   MRN: [id number]  HPI  A transfer from Dr Lenna Gilford - new pt  The patient presents for a follow-up of  chronic hypertension, chronic dyslipidemia, type 2 diabetes. C/o leg swelling - worse. C/o cough x long time   Wt Readings from Last 3 Encounters:  08/15/13 285 lb (129.275 kg)  04/10/13 291 lb (131.997 kg)  03/28/13 291 lb 12.8 oz (132.36 kg)   BP Readings from Last 3 Encounters:  08/15/13 120/74  04/10/13 162/86  01/17/13 138/62      Review of Systems  Constitutional: Negative for appetite change, fatigue and unexpected weight change.  HENT: Negative for congestion, nosebleeds, sneezing, sore throat and trouble swallowing.   Eyes: Negative for itching and visual disturbance.  Respiratory: Positive for cough.   Cardiovascular: Negative for chest pain, palpitations and leg swelling.  Gastrointestinal: Negative for nausea, diarrhea, blood in stool and abdominal distention.  Genitourinary: Negative for frequency and hematuria.  Musculoskeletal: Negative for back pain, gait problem, joint swelling and neck pain.  Skin: Negative for rash.  Neurological: Negative for dizziness, tremors, speech difficulty and weakness.  Psychiatric/Behavioral: Negative for suicidal ideas, sleep disturbance, dysphoric mood and agitation. The patient is not nervous/anxious.        Objective:   Physical Exam  Constitutional: He is oriented to person, place, and time. He appears well-developed. No distress.  NAD  HENT:  Mouth/Throat: Oropharynx is clear and moist.  Eyes: Conjunctivae are normal. Pupils are equal, round, and reactive to light.  Neck: Normal range of motion. No JVD present. No thyromegaly present.  Cardiovascular: Normal rate, regular rhythm, normal heart sounds and intact distal pulses.  Exam reveals no gallop and no friction rub.   No murmur heard. Pulmonary/Chest: Effort normal and breath sounds  normal. No respiratory distress. He has no wheezes. He has no rales. He exhibits no tenderness.  Abdominal: Soft. Bowel sounds are normal. He exhibits no distension and no mass. There is no tenderness. There is no rebound and no guarding.  Musculoskeletal: Normal range of motion. He exhibits edema. He exhibits no tenderness.  2+  Lymphadenopathy:    He has no cervical adenopathy.  Neurological: He is alert and oriented to person, place, and time. He has normal reflexes. No cranial nerve deficit. He exhibits normal muscle tone. He displays a negative Romberg sign. Coordination and gait normal.  No meningeal signs  Skin: Skin is warm and dry. No rash noted.  Psychiatric: He has a normal mood and affect. His behavior is normal. Judgment and thought content normal.    Lab Results  Component Value Date   WBC 6.0 07/17/2012   HGB 13.0 07/17/2012   HCT 38.9* 07/17/2012   PLT 286.0 07/17/2012   GLUCOSE 118* 01/17/2013   CHOL 145 07/17/2012   TRIG 77.0 07/17/2012   HDL 46.50 07/17/2012   LDLCALC 83 07/17/2012   ALT 20 07/17/2012   AST 17 07/17/2012   NA 140 01/17/2013   K 4.7 01/17/2013   CL 104 01/17/2013   CREATININE 1.0 01/17/2013   BUN 27* 01/17/2013   CO2 30 01/17/2013   TSH 1.81 07/17/2012   PSA 0.50 07/17/2012   INR 1.08 02/02/2011   HGBA1C 7.5* 01/17/2013   MICROALBUR 88.9* 07/17/2012         Assessment & Plan:

## 2013-08-15 NOTE — Telephone Encounter (Signed)
Relevant patient education assigned to patient using Emmi. ° °

## 2013-08-28 ENCOUNTER — Telehealth: Payer: Self-pay

## 2013-08-28 NOTE — Telephone Encounter (Signed)
Relevant patient education assigned to patient using Emmi. ° °

## 2013-09-12 ENCOUNTER — Telehealth: Payer: Self-pay

## 2013-09-12 NOTE — Telephone Encounter (Signed)
The patient called and is hoping to get a refill on his liquid cough medicine.   Pharmacy - [place]  Call - [phone]

## 2013-09-14 MED ORDER — HYDROCOD POLST-CHLORPHEN POLST 10-8 MG/5ML PO LQCR: 5.0000 mL | Freq: Two times a day (BID) | ORAL | Status: DC | PRN

## 2013-09-14 NOTE — Telephone Encounter (Signed)
Rx ready. Pt informed

## 2013-09-24 ENCOUNTER — Other Ambulatory Visit: Payer: Self-pay | Admitting: *Deleted

## 2013-09-25 ENCOUNTER — Other Ambulatory Visit: Payer: Self-pay | Admitting: *Deleted

## 2013-09-25 MED ORDER — INSULIN GLARGINE 100 UNIT/ML ~~LOC~~ SOLN: 70.0000 [IU] | Freq: Every day | SUBCUTANEOUS | Status: DC

## 2013-11-07 ENCOUNTER — Other Ambulatory Visit: Payer: Self-pay | Admitting: Internal Medicine

## 2013-11-07 DIAGNOSIS — E119 Type 2 diabetes mellitus without complications: Secondary | ICD-10-CM

## 2013-11-19 ENCOUNTER — Other Ambulatory Visit: Payer: Self-pay | Admitting: *Deleted

## 2013-11-19 ENCOUNTER — Other Ambulatory Visit (INDEPENDENT_AMBULATORY_CARE_PROVIDER_SITE_OTHER): Payer: Managed Care, Other (non HMO)

## 2013-11-19 DIAGNOSIS — E119 Type 2 diabetes mellitus without complications: Secondary | ICD-10-CM

## 2013-11-19 LAB — HEMOGLOBIN A1C: Hgb A1c MFr Bld: 11.6 % — ABNORMAL HIGH (ref 4.6–6.5)

## 2013-11-19 LAB — LIPID PANEL
CHOL/HDL RATIO: 4
Cholesterol: 188 mg/dL (ref 0–200)
HDL: 51.9 mg/dL (ref >39.00)
LDL CALC: 117 mg/dL — AB (ref 0–99)
NonHDL: 136.1
Triglycerides: 94 mg/dL (ref 0.0–149.0)
VLDL: 18.8 mg/dL (ref 0.0–40.0)

## 2013-11-21 ENCOUNTER — Ambulatory Visit: Payer: Managed Care, Other (non HMO) | Admitting: Internal Medicine

## 2013-11-22 ENCOUNTER — Encounter: Payer: Self-pay | Admitting: Internal Medicine

## 2013-11-22 ENCOUNTER — Ambulatory Visit (INDEPENDENT_AMBULATORY_CARE_PROVIDER_SITE_OTHER): Payer: Managed Care, Other (non HMO) | Admitting: Internal Medicine

## 2013-11-22 VITALS — BP 172/84 | HR 80 | Temp 98.9°F | Resp 16 | Ht 69.0 in | Wt 273.0 lb

## 2013-11-22 DIAGNOSIS — E785 Hyperlipidemia, unspecified: Secondary | ICD-10-CM

## 2013-11-22 DIAGNOSIS — E669 Obesity, unspecified: Secondary | ICD-10-CM

## 2013-11-22 DIAGNOSIS — I10 Essential (primary) hypertension: Secondary | ICD-10-CM

## 2013-11-22 DIAGNOSIS — E119 Type 2 diabetes mellitus without complications: Secondary | ICD-10-CM

## 2013-11-22 DIAGNOSIS — Z0000 Encounter for general adult medical examination without abnormal findings: Secondary | ICD-10-CM | POA: Insufficient documentation

## 2013-11-22 MED ORDER — INSULIN GLARGINE 100 UNIT/ML SOLOSTAR PEN: PEN_INJECTOR | SUBCUTANEOUS | Status: DC

## 2013-11-22 MED ORDER — HYDROCOD POLST-CHLORPHEN POLST 10-8 MG/5ML PO LQCR: 5.0000 mL | Freq: Two times a day (BID) | ORAL | Status: DC | PRN

## 2013-11-22 NOTE — Assessment & Plan Note (Signed)
Worse See Rx 

## 2013-11-22 NOTE — Assessment & Plan Note (Signed)
We discussed age appropriate health related issues, including available/recomended screening tests and vaccinations. We discussed a need for adhering to healthy diet and exercise. Labs/EKG were reviewed/ordered. All questions were answered.   

## 2013-11-22 NOTE — Assessment & Plan Note (Signed)
Continue with current prescription therapy as reflected on the Med list.  

## 2013-11-22 NOTE — Assessment & Plan Note (Signed)
Chronic, refractory Lap Band discussed

## 2013-11-22 NOTE — Progress Notes (Signed)
Pre visit review using our clinic review tool, if applicable. No additional management support is needed unless otherwise documented below in the visit note. 

## 2013-11-22 NOTE — Patient Instructions (Addendum)
Please add Lantus 20 units at night. Increase by 2 units every 2 days for goal sugars of 100-150  Goal for now: Lantus 70 units in am, 30 units at night    For congestion: Milk free trial (no milk, ice cream, cheese and yogurt) for 4-6 weeks. OK to use almond, coconut, rice or soy milk. "Almond breeze" brand tastes good.

## 2013-11-22 NOTE — Progress Notes (Signed)
Patient ID: [name] [name], male   DOB: 1951-03-01, 63 y.o.   MRN: [id number]   Subjective:    HPI  The patient is here for a wellness exam. C/o peeing all the time, blurred vision The patient presents for a follow-up of  chronic hypertension, chronic dyslipidemia, type 2 diabetes. F/u leg swelling.   Wt Readings from Last 3 Encounters:  11/22/13 273 lb (123.832 kg)  08/15/13 285 lb (129.275 kg)  04/10/13 291 lb (131.997 kg)   BP Readings from Last 3 Encounters:  11/22/13 172/84  08/15/13 120/74  04/10/13 162/86      Review of Systems  Constitutional: Positive for unexpected weight change. Negative for appetite change and fatigue.  HENT: Negative for congestion, nosebleeds, sneezing, sore throat and trouble swallowing.   Eyes: Positive for visual disturbance. Negative for itching.  Respiratory: Positive for cough.   Cardiovascular: Positive for leg swelling. Negative for chest pain and palpitations.  Gastrointestinal: Negative for nausea, diarrhea, blood in stool and abdominal distention.  Genitourinary: Positive for urgency and frequency. Negative for hematuria.  Musculoskeletal: Negative for back pain, gait problem, joint swelling and neck pain.  Skin: Negative for rash.  Neurological: Negative for dizziness, tremors, speech difficulty and weakness.  Psychiatric/Behavioral: Negative for suicidal ideas, sleep disturbance, dysphoric mood and agitation. The patient is not nervous/anxious.        Objective:   Physical Exam  Constitutional: He is oriented to person, place, and time. He appears well-developed. No distress.  Obese NAD  HENT:  Mouth/Throat: Oropharynx is clear and moist.  Eyes: Conjunctivae are normal. Pupils are equal, round, and reactive to light.  Neck: Normal range of motion. No JVD present. No thyromegaly present.  Cardiovascular: Normal rate, regular rhythm, normal heart sounds and intact distal pulses.  Exam reveals no gallop and no friction rub.     No murmur heard. Pulmonary/Chest: Effort normal and breath sounds normal. No respiratory distress. He has no wheezes. He has no rales. He exhibits no tenderness.  Abdominal: Soft. Bowel sounds are normal. He exhibits no distension and no mass. There is no tenderness. There is no rebound and no guarding.  Genitourinary: Rectum normal and prostate normal. Guaiac negative stool.  Musculoskeletal: Normal range of motion. He exhibits edema. He exhibits no tenderness.  1+  Lymphadenopathy:    He has no cervical adenopathy.  Neurological: He is alert and oriented to person, place, and time. He has normal reflexes. No cranial nerve deficit. He exhibits normal muscle tone. He displays a negative Romberg sign. Coordination and gait normal.  Skin: Skin is warm and dry. No rash noted.  Psychiatric: He has a normal mood and affect. His behavior is normal. Judgment and thought content normal.    Lab Results  Component Value Date   WBC 6.0 07/17/2012   HGB 13.0 07/17/2012   HCT 38.9* 07/17/2012   PLT 286.0 07/17/2012   GLUCOSE 118* 01/17/2013   CHOL 188 11/19/2013   TRIG 94.0 11/19/2013   HDL 51.90 11/19/2013   LDLCALC 117* 11/19/2013   ALT 20 07/17/2012   AST 17 07/17/2012   NA 140 01/17/2013   K 4.7 01/17/2013   CL 104 01/17/2013   CREATININE 1.0 01/17/2013   BUN 27* 01/17/2013   CO2 30 01/17/2013   TSH 1.81 07/17/2012   PSA 0.50 07/17/2012   INR 1.08 02/02/2011   HGBA1C 11.6* 11/19/2013   MICROALBUR 88.9* 07/17/2012         Assessment & Plan:

## 2013-11-22 NOTE — Assessment & Plan Note (Signed)
7/15 - worse Endocr ref Lantus - add 02-26-29 at hs

## 2013-12-21 ENCOUNTER — Ambulatory Visit (INDEPENDENT_AMBULATORY_CARE_PROVIDER_SITE_OTHER): Payer: Managed Care, Other (non HMO) | Admitting: Endocrinology

## 2013-12-21 ENCOUNTER — Encounter: Payer: Self-pay | Admitting: Endocrinology

## 2013-12-21 VITALS — BP 122/80 | HR 67 | Temp 98.2°F | Ht 69.0 in | Wt 273.0 lb

## 2013-12-21 DIAGNOSIS — E119 Type 2 diabetes mellitus without complications: Secondary | ICD-10-CM

## 2013-12-21 MED ORDER — ONETOUCH VERIO SYNC SYSTEM W/DEVICE KIT: 1.0000 | PACK | Freq: Once | Status: DC

## 2013-12-21 MED ORDER — GLUCOSE BLOOD VI STRP: 1.0000 | ORAL_STRIP | Freq: Two times a day (BID) | Status: DC

## 2013-12-21 NOTE — Progress Notes (Signed)
Subjective:    Patient ID: [name] [name], male    DOB: February 05, 1951, 63 y.o.   MRN: [id number]  HPI pt states DM was dx'ed in 1986; he has mild neuropathy of the lower extremities, and associated CAD; he has been on insulin since 2001; pt says his diet and exercise are not very good; he has never had pancreatitis, severe hypoglycemia or DKA.  He does not check cbg's.   Past Medical History  Diagnosis Date  . OSA (obstructive sleep apnea)     cpap  . Hypertension   . Hyperlipidemia   . Diabetes mellitus, type 2   . Exogenous obesity   . Diverticulosis of colon   . Testicular hypofunction   . Erectile dysfunction   . Cervical disc disease   . Bursitis of left shoulder   . History of anemia     Past Surgical History  Procedure Laterality Date  . Posterior laminectomy / decompression cervical spine      c5-6 fusion 12/08 Dr. Consuello Masse  . S/p cervical disc surgery and fusion  10/09    Dr. Consuello Masse    History   Social History  . Marital Status: Married    Spouse Name: N/A    Number of Children: 1  . Years of Education: N/A   Occupational History  . 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 Smoking status: Former Smoker    Quit date: 01/20/1981  . Smokeless tobacco: Never Used  . Alcohol Use: 3.9 oz/week    4 Glasses of wine, 3 Drinks containing 0.5 oz of alcohol per week  . Drug Use: No  . Sexual Activity: Yes   Other Topics Concern  . Not on file   Social History Narrative  . No narrative on file    Current Outpatient Prescriptions on File Prior to Visit  Medication Sig Dispense Refill  . alfuzosin (UROXATRAL) 10 MG 24 hr tablet Take 1 tablet (10 mg total) by mouth daily.  90 tablet  3  . amLODipine (NORVASC) 10 MG tablet Take 1 tablet (10 mg total) by mouth daily.  90 tablet  3  . aspirin 81 MG tablet Take 81 mg by mouth daily.        . cholecalciferol (VITAMIN D) 1000 UNITS tablet Take 1 tablet (1,000  Units total) by mouth daily.  100 tablet  3  . furosemide (LASIX) 40 MG tablet Take 1 tablet (40 mg total) by mouth daily.  90 tablet  3  . Insulin Glargine (LANTUS SOLOSTAR) 100 UNIT/ML Solostar Pen 70 u qam 20 u qpm  15 mL  11  . Insulin Pen Needle 31G X 8 MM MISC Use once daily as directed  100 each  6  . losartan (COZAAR) 100 MG tablet Take 1 tablet (100 mg total) by mouth daily.  90 tablet  3  . metformin (FORTAMET) 1000 MG (OSM) 24 hr tablet Take 1 tablet (1,000 mg total) by mouth daily with breakfast.  90 tablet  3  . metoprolol succinate (TOPROL XL) 25 MG 24 hr tablet Take 1 tablet (25 mg total) by mouth daily.  90 tablet  3  . rosuvastatin (CRESTOR) 20 MG tablet Take 1 tablet (20 mg total) by mouth daily.  90 tablet  3  . chlorpheniramine-HYDROcodone (TUSSIONEX PENNKINETIC ER) 10-8 MG/5ML LQCR Take 5 mLs by mouth every 12 (twelve) hours as needed.  140 mL  0  .  hydrocortisone 2.5 % cream        No current facility-administered medications on file prior to visit.    Allergies  Allergen Reactions  . Amoxicillin Nausea And Vomiting  . Levofloxacin     REACTION: unspecified  . Lisinopril     Cough   . Penicillins     Per pt: unknown  . Simvastatin     myalgia    Family History  Problem Relation Age of Onset  . Heart disease Father     Died of MI at age 79  . Breast cancer Mother   . Colon cancer Neg Hx   . Diabetes Paternal Grandmother     BP 122/80  Pulse 67  Temp(Src) 98.2 F (36.8 C) (Oral)  Ht 5\' 9"  (1.753 m)  Wt 273 lb (123.832 kg)  BMI 40.30 kg/m2  SpO2 95%     Review of Systems He has lost a few lbs.  He has polyuria and easy bruising.  denies blurry vision, headache, chest pain, sob, n/v, muscle cramps, excessive diaphoresis, depression, cold intolerance, and rhinorrhea     Objective:   Physical Exam VS: see vs page GEN: no distress HEAD: head: no deformity eyes: no periorbital swelling, no proptosis external nose and ears are normal mouth: no  lesion seen NECK: supple, thyroid is not enlarged CHEST WALL: no deformity LUNGS: clear to auscultation BREASTS:  No gynecomastia CV: reg rate and rhythm, no murmur ABD: abdomen is soft, nontender.  no hepatosplenomegaly.  not distended.  no hernia.  MUSCULOSKELETAL: muscle bulk and strength are grossly normal.  no obvious joint swelling.  gait is normal and steady EXTEMITIES: no deformity.  no ulcer on the feet.  feet are of normal color and temp.  2+ bilat leg edema, and erythema of the anterior tibial areas.   PULSES: dorsalis pedis intact bilat.  no carotid bruit.   NEURO:  cn 2-12 grossly intact.   readily moves all 4's.  sensation is intact to touch on the feet, but decreased from normal.  SKIN:  Normal texture and temperature.  No rash or suspicious lesion is visible.   NODES:  None palpable at the neck.  PSYCH: alert, well-oriented.  Does not appear anxious nor depressed.   i reviewed electrocardiogram Lab Results  Component Value Date   HGBA1C 11.6* 11/19/2013  i have reviewed the following outside records: Office notes    Assessment & Plan:  DM: severe exacerbation Morbid obesity, new to me.   Patient is advised the following: Patient Instructions  good diet and exercise habits significanly improve the control of your diabetes.  please let me know if you wish to be referred to a dietician.  high blood sugar is very risky to your health.  you should see an eye doctor and dentist every year.  You are at higher than average risk for pneumonia and hepatitis-B.  You should be vaccinated against both.   controlling your blood pressure and cholesterol drastically reduces the damage diabetes does to your body.  this also applies to quitting smoking.  please discuss these with your doctor.  check your blood sugar twice a day.  vary the time of day when you check, between before the 3 meals, and at bedtime.  also check if you have symptoms of your blood sugar being too high or too low.   please keep a record of the readings and bring it to your next appointment here.  You can write it on any piece of  paper.  please call us sooner if your blood sugar goes below 70, or if you have a lot of readings over 200.  Please see a weight-loss surgery specialist.  you will receive a phone call, about a day and time for an appointment.   Please call in 2 weeks to tell us how the blood sugar is going Please come back for a follow-up appointment in 1 month.

## 2013-12-21 NOTE — Patient Instructions (Addendum)
good diet and exercise habits significanly improve the control of your diabetes.  please let me know if you wish to be referred to a dietician.  high blood sugar is very risky to your health.  you should see an eye doctor and dentist every year.  You are at higher than average risk for pneumonia and hepatitis-B.  You should be vaccinated against both.   controlling your blood pressure and cholesterol drastically reduces the damage diabetes does to your body.  this also applies to quitting smoking.  please discuss these with your doctor.  check your blood sugar twice a day.  vary the time of day when you check, between before the 3 meals, and at bedtime.  also check if you have symptoms of your blood sugar being too high or too low.  please keep a record of the readings and bring it to your next appointment here.  You can write it on any piece of paper.  please call us sooner if your blood sugar goes below 70, or if you have a lot of readings over 200.  Please see a weight-loss surgery specialist.  you will receive a phone call, about a day and time for an appointment.   Please call in 2 weeks to tell us how the blood sugar is going Please come back for a follow-up appointment in 1 month.

## 2014-01-09 ENCOUNTER — Telehealth: Payer: Self-pay | Admitting: Endocrinology

## 2014-01-09 NOTE — Telephone Encounter (Signed)
Pt wanting call back regarding the weight loss surgery and go over his blood sugars with the nurse

## 2014-01-09 NOTE — Telephone Encounter (Signed)
Pt called with blood sugar readings for the past 3 days.   01/07/2014  AM 268   01/08/2014  Am 251  01/09/2014  Am 170  Afternoon 298   Pt confirmed that he is taking 70 units of the Lantus in the Morning.  Please advise, Thanks!

## 2014-01-09 NOTE — Telephone Encounter (Signed)
He takes lantus 70 units qam.  Does he take any in the evening?

## 2014-01-10 NOTE — Telephone Encounter (Signed)
Pt advised of new instructions. Pt states that he will begin new dosage.

## 2014-01-10 NOTE — Telephone Encounter (Addendum)
Pt states that he does not take any insulin in the evening because he forgets and is not consistent.

## 2014-01-10 NOTE — Telephone Encounter (Signed)
Ok, please increase to 90 units qam

## 2014-01-30 ENCOUNTER — Encounter: Payer: Self-pay | Admitting: Endocrinology

## 2014-01-30 ENCOUNTER — Ambulatory Visit (INDEPENDENT_AMBULATORY_CARE_PROVIDER_SITE_OTHER): Payer: Managed Care, Other (non HMO) | Admitting: Endocrinology

## 2014-01-30 VITALS — BP 122/64 | HR 73 | Temp 98.0°F | Ht 69.0 in | Wt 274.0 lb

## 2014-01-30 DIAGNOSIS — E119 Type 2 diabetes mellitus without complications: Secondary | ICD-10-CM

## 2014-01-30 LAB — MICROALBUMIN / CREATININE URINE RATIO
Creatinine,U: 132.3 mg/dL
Microalb Creat Ratio: 104.9 mg/g — ABNORMAL HIGH (ref 0.0–30.0)

## 2014-01-30 MED ORDER — INSULIN LISPRO 200 UNIT/ML ~~LOC~~ SOPN: 10.0000 [IU] | PEN_INJECTOR | Freq: Three times a day (TID) | SUBCUTANEOUS | Status: DC

## 2014-01-30 NOTE — Progress Notes (Signed)
Subjective:    Patient ID: [name] [name], male    DOB: 11-10-1950, 63 y.o.   MRN: [id number]  HPI Pt returns for f/u of diabetes mellitus:  DM type: insulin-requiring type 1 Dx'ed: 0998 Complications: sensory neuropathy of the lower extremities and CAD Therapy: insulin since 2001 DKA: never Severe hypoglycemia: never Pancreatitis: never Other info: he takes multiple daily injections.  Interval history:  he brings a record of his cbg's which i have reviewed today.  It varies from 82-300.  There is no trend throughout the day.  Past Medical History  Diagnosis Date  . OSA (obstructive sleep apnea)     cpap  . Hypertension   . Hyperlipidemia   . Diabetes mellitus, type 2   . Exogenous obesity   . Diverticulosis of colon   . Testicular hypofunction   . Erectile dysfunction   . Cervical disc disease   . Bursitis of left shoulder   . History of anemia     Past Surgical History  Procedure Laterality Date  . Posterior laminectomy / decompression cervical spine      c5-6 fusion 12/08 Dr. Consuello Masse  . S/p cervical disc surgery and fusion  10/09    Dr. Consuello Masse    History   Social History  . Marital Status: Married    Spouse Name: N/A    Number of Children: 1  . Years of Education: N/A   Occupational History  . 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 Smoking status: Former Smoker    Quit date: 01/20/1981  . Smokeless tobacco: Never Used  . Alcohol Use: 3.9 oz/week    4 Glasses of wine, 3 Drinks containing 0.5 oz of alcohol per week  . Drug Use: No  . Sexual Activity: Yes   Other Topics Concern  . Not on file   Social History Narrative  . No narrative on file    Current Outpatient Prescriptions on File Prior to Visit  Medication Sig Dispense Refill  . alfuzosin (UROXATRAL) 10 MG 24 hr tablet Take 1 tablet (10 mg total) by mouth daily.  90 tablet  3  . amLODipine (NORVASC) 10 MG tablet Take 1 tablet  (10 mg total) by mouth daily.  90 tablet  3  . aspirin 81 MG tablet Take 81 mg by mouth daily.        . Blood Glucose Monitoring Suppl (ONETOUCH VERIO [facility] SYSTEM) W/DEVICE KIT 1 Device by Does not apply route once.  1 kit  0  . cholecalciferol (VITAMIN D) 1000 UNITS tablet Take 1 tablet (1,000 Units total) by mouth daily.  100 tablet  3  . furosemide (LASIX) 40 MG tablet Take 1 tablet (40 mg total) by mouth daily.  90 tablet  3  . glucose blood (ONETOUCH VERIO) test strip 1 each by Other route 2 (two) times daily. And lancets 2/day 250.61  100 each  12  . hydrocortisone 2.5 % cream       . Insulin Pen Needle 31G X 8 MM MISC Use once daily as directed  100 each  6  . losartan (COZAAR) 100 MG tablet Take 1 tablet (100 mg total) by mouth daily.  90 tablet  3  . metoprolol succinate (TOPROL XL) 25 MG 24 hr tablet Take 1 tablet (25 mg total) by mouth daily.  90 tablet  3  . rosuvastatin (CRESTOR) 20 MG tablet Take 1 tablet (20 mg total)  by mouth daily.  90 tablet  3   No current facility-administered medications on file prior to visit.    Allergies  Allergen Reactions  . Amoxicillin Nausea And Vomiting  . Levofloxacin     REACTION: unspecified  . Lisinopril     Cough   . Penicillins     Per pt: unknown  . Simvastatin     myalgia    Family History  Problem Relation Age of Onset  . Heart disease Father     Died of MI at age 32  . Breast cancer Mother   . Colon cancer Neg Hx   . Diabetes Paternal Grandmother     BP 122/64  Pulse 73  Temp(Src) 98 F (36.7 C) (Oral)  Ht '5\' 9"'  (1.753 m)  Wt 274 lb (124.286 kg)  BMI 40.44 kg/m2  SpO2 97%  Review of Systems He denies hypoglycemia    Objective:   Physical Exam VITAL SIGNS:  See vs page.  GENERAL: no distress. SKIN:  Insulin injection sites at the anterior abdomen are normal    Lab Results  Component Value Date   HGBA1C 11.6* 11/19/2013      Assessment & Plan:  DM: The pattern of his cbg's indicates he needs some  adjustment in his therapy.   Patient is advised the following: Patient Instructions  Please stop taking the fortamet. Please continue to pursue the weight-loss surgery. i have sent a prescription to your pharmacy, to add humalog 10 units 3 times a day (just before each meal) Please reduce the lantus to 50 units at bedtime.   check your blood sugar twice a day.  vary the time of day when you check, between before the 3 meals, and at bedtime.  also check if you have symptoms of your blood sugar being too high or too low.  please keep a record of the readings and bring it to your next appointment here.  You can write it on any piece of paper.  please call us sooner if your blood sugar goes below 70, or if you have a lot of readings over 200.   Please come back for a follow-up appointment in 1 month.

## 2014-01-30 NOTE — Patient Instructions (Addendum)
Please stop taking the fortamet. Please continue to pursue the weight-loss surgery. i have sent a prescription to your pharmacy, to add humalog 10 units 3 times a day (just before each meal) Please reduce the lantus to 50 units at bedtime.   check your blood sugar twice a day.  vary the time of day when you check, between before the 3 meals, and at bedtime.  also check if you have symptoms of your blood sugar being too high or too low.  please keep a record of the readings and bring it to your next appointment here.  You can write it on any piece of paper.  please call us sooner if your blood sugar goes below 70, or if you have a lot of readings over 200.   Please come back for a follow-up appointment in 1 month.

## 2014-02-12 ENCOUNTER — Telehealth: Payer: Self-pay

## 2014-02-12 NOTE — Telephone Encounter (Signed)
Flu documentation

## 2014-03-13 ENCOUNTER — Ambulatory Visit (INDEPENDENT_AMBULATORY_CARE_PROVIDER_SITE_OTHER): Payer: Managed Care, Other (non HMO) | Admitting: Endocrinology

## 2014-03-13 ENCOUNTER — Encounter: Payer: Self-pay | Admitting: Internal Medicine

## 2014-03-13 ENCOUNTER — Encounter: Payer: Self-pay | Admitting: Endocrinology

## 2014-03-13 ENCOUNTER — Ambulatory Visit (INDEPENDENT_AMBULATORY_CARE_PROVIDER_SITE_OTHER): Payer: Managed Care, Other (non HMO) | Admitting: Internal Medicine

## 2014-03-13 VITALS — BP 158/67 | HR 74 | Temp 98.6°F | Ht 69.0 in | Wt 274.0 lb

## 2014-03-13 VITALS — BP 130/62 | HR 85 | Temp 99.2°F | Wt 274.0 lb

## 2014-03-13 DIAGNOSIS — E1165 Type 2 diabetes mellitus with hyperglycemia: Secondary | ICD-10-CM

## 2014-03-13 DIAGNOSIS — E1159 Type 2 diabetes mellitus with other circulatory complications: Secondary | ICD-10-CM

## 2014-03-13 DIAGNOSIS — E669 Obesity, unspecified: Secondary | ICD-10-CM

## 2014-03-13 DIAGNOSIS — I10 Essential (primary) hypertension: Secondary | ICD-10-CM

## 2014-03-13 DIAGNOSIS — IMO0002 Reserved for concepts with insufficient information to code with codable children: Secondary | ICD-10-CM

## 2014-03-13 LAB — GLUCOSE, POCT (MANUAL RESULT ENTRY): POC GLUCOSE: 103 mg/dL — AB (ref 70–99)

## 2014-03-13 MED ORDER — HYDROCOD POLST-CHLORPHEN POLST 10-8 MG/5ML PO LQCR: 5.0000 mL | Freq: Two times a day (BID) | ORAL | Status: DC | PRN

## 2014-03-13 MED ORDER — INSULIN GLARGINE 100 UNIT/ML SOLOSTAR PEN: 120.0000 [IU] | PEN_INJECTOR | SUBCUTANEOUS | Status: DC

## 2014-03-13 NOTE — Assessment & Plan Note (Signed)
Continue with current prescription therapy as reflected on the Med list.  

## 2014-03-13 NOTE — Progress Notes (Signed)
Subjective:    Patient ID: [name] [name], male    DOB: 08-05-1950, 63 y.o.   MRN: [id number]  HPI Pt returns for f/u of diabetes mellitus: DM type: insulin-requiring type 2 Dx'ed: 3005 Complications: sensory neuropathy of the lower extremities and CAD Therapy: insulin since 2001 DKA: never Severe hypoglycemia: never Pancreatitis: never Other: he takes multiple daily injections. Interval history: He says he is having trouble remembering the multiple daily injections, and wants to go back to qd.  he brings a scant record of his cbg's which i have reviewed today.  All are approx 300.  He went to the weight-loss surgery seminar.  He is unsure he wants to pursue.   Past Medical History  Diagnosis Date  . OSA (obstructive sleep apnea)     cpap  . Hypertension   . Hyperlipidemia   . Diabetes mellitus, type 2   . Exogenous obesity   . Diverticulosis of colon   . Testicular hypofunction   . Erectile dysfunction   . Cervical disc disease   . Bursitis of left shoulder   . History of anemia     Past Surgical History  Procedure Laterality Date  . Posterior laminectomy / decompression cervical spine      c5-6 fusion 12/08 Dr. Consuello Masse  . S/p cervical disc surgery and fusion  10/09    Dr. Consuello Masse    History   Social History  . Marital Status: Married    Spouse Name: N/A    Number of Children: 1  . Years of Education: N/A   Occupational History  . 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 Smoking status: Former Smoker    Quit date: 01/20/1981  . Smokeless tobacco: Never Used  . Alcohol Use: 3.9 oz/week    4 Glasses of wine, 3 Not specified per week  . Drug Use: No  . Sexual Activity: Yes   Other Topics Concern  . Not on file   Social History Narrative    Current Outpatient Prescriptions on File Prior to Visit  Medication Sig Dispense Refill  . alfuzosin (UROXATRAL) 10 MG 24 hr tablet Take 1 tablet (10 mg  total) by mouth daily. 90 tablet 3  . amLODipine (NORVASC) 10 MG tablet Take 1 tablet (10 mg total) by mouth daily. 90 tablet 3  . aspirin 81 MG tablet Take 81 mg by mouth daily.      . Blood Glucose Monitoring Suppl (ONETOUCH VERIO [facility] SYSTEM) W/DEVICE KIT 1 Device by Does not apply route once. 1 kit 0  . chlorpheniramine-HYDROcodone (TUSSIONEX PENNKINETIC ER) 10-8 MG/5ML LQCR Take 5 mLs by mouth every 12 (twelve) hours as needed. 140 mL 0  . cholecalciferol (VITAMIN D) 1000 UNITS tablet Take 1 tablet (1,000 Units total) by mouth daily. 100 tablet 3  . furosemide (LASIX) 40 MG tablet Take 1 tablet (40 mg total) by mouth daily. 90 tablet 3  . glucose blood (ONETOUCH VERIO) test strip 1 each by Other route 2 (two) times daily. And lancets 2/day 250.61 100 each 12  . hydrocortisone 2.5 % cream     . Insulin Pen Needle 31G X 8 MM MISC Use once daily as directed 100 each 6  . losartan (COZAAR) 100 MG tablet Take 1 tablet (100 mg total) by mouth daily. 90 tablet 3  . metoprolol succinate (TOPROL XL) 25 MG 24 hr tablet Take 1 tablet (25 mg total) by mouth daily.  90 tablet 3  . rosuvastatin (CRESTOR) 20 MG tablet Take 1 tablet (20 mg total) by mouth daily. 90 tablet 3   No current facility-administered medications on file prior to visit.    Allergies  Allergen Reactions  . Amoxicillin Nausea And Vomiting  . Levofloxacin     REACTION: unspecified  . Lisinopril     Cough   . Penicillins     Per pt: unknown  . Simvastatin     myalgia    Family History  Problem Relation Age of Onset  . Heart disease Father     Died of MI at age 58  . Breast cancer Mother   . Colon cancer Neg Hx   . Diabetes Paternal Grandmother     BP 158/67 mmHg  Pulse 74  Temp(Src) 98.6 F (37 C) (Oral)  Ht _0  (1.753 m)  Wt 274 lb (124.286 kg)  BMI 40.44 kg/m2  SpO2 96%  Review of Systems     Objective:   Physical Exam VITAL SIGNS:  See vs page GENERAL: no distress EXTEMITIES: no deformity.  2+  bilat leg edema, and erythema of the anterior tibial areas.   SKIN: no ulcer on the feet. feet are of normal color and temp. PULSES: dorsalis pedis intact bilat.   NEURO: sensation is intact to touch on the feet, but decreased from normal.    Lab Results  Component Value Date   HGBA1C 11.6* 11/19/2013      Assessment & Plan:  DM: severe exacerbation Noncompliance with insulin injections: I'll work around this as best I can, but he requests to change to QD injections. Morbid obesity, persistent HTN: on rx.  Patient is advised the following: Patient Instructions  Please continue to pursue the weight-loss surgery.  It helps you live longer.   Please stop taking the humalog, and: Please increase the lantus to 120 units each morning.   check your blood sugar twice a day.  vary the time of day when you check, between before the 3 meals, and at bedtime.  also check if you have symptoms of your blood sugar being too high or too low.  please keep a record of the readings and bring it to your next appointment here.  You can write it on any piece of paper.  please call us sooner if your blood sugar goes below 70, or if you have a lot of readings over 200.   Please come back for a follow-up appointment in 1 month.   Please see Dr Alain Marion for your blood pressure, as it is high today.

## 2014-03-13 NOTE — Progress Notes (Signed)
   Subjective:    HPI   The patient presents for a follow-up of  chronic hypertension, chronic dyslipidemia, type 2 diabetes. Seeing Dr Loanne Drilling. F/u leg swelling.   Wt Readings from Last 3 Encounters:  03/13/14 274 lb (124.286 kg)  03/13/14 274 lb (124.286 kg)  01/30/14 274 lb (124.286 kg)   BP Readings from Last 3 Encounters:  03/13/14 130/62  03/13/14 158/67  01/30/14 122/64      Review of Systems  Constitutional: Positive for unexpected weight change. Negative for appetite change and fatigue.  HENT: Negative for congestion, nosebleeds, sneezing, sore throat and trouble swallowing.   Eyes: Positive for visual disturbance. Negative for itching.  Respiratory: Positive for cough.   Cardiovascular: Positive for leg swelling. Negative for chest pain and palpitations.  Gastrointestinal: Negative for nausea, diarrhea, blood in stool and abdominal distention.  Genitourinary: Positive for urgency and frequency. Negative for hematuria.  Musculoskeletal: Negative for back pain, joint swelling, gait problem and neck pain.  Skin: Negative for rash.  Neurological: Negative for dizziness, tremors, speech difficulty and weakness.  Psychiatric/Behavioral: Negative for suicidal ideas, sleep disturbance, dysphoric mood and agitation. The patient is not nervous/anxious.        Objective:   Physical Exam  Constitutional: He is oriented to person, place, and time. He appears well-developed. No distress.  NAD  HENT:  Mouth/Throat: Oropharynx is clear and moist.  Eyes: Conjunctivae are normal. Pupils are equal, round, and reactive to light.  Neck: Normal range of motion. No JVD present. No thyromegaly present.  Cardiovascular: Normal rate, regular rhythm, normal heart sounds and intact distal pulses.  Exam reveals no gallop and no friction rub.   No murmur heard. Pulmonary/Chest: Effort normal and breath sounds normal. No respiratory distress. He has no wheezes. He has no rales. He exhibits  no tenderness.  Abdominal: Soft. Bowel sounds are normal. He exhibits no distension and no mass. There is no tenderness. There is no rebound and no guarding.  Musculoskeletal: Normal range of motion. He exhibits no edema or tenderness.  Lymphadenopathy:    He has no cervical adenopathy.  Neurological: He is alert and oriented to person, place, and time. He has normal reflexes. No cranial nerve deficit. He exhibits normal muscle tone. He displays a negative Romberg sign. Coordination and gait normal.  No meningeal signs  Skin: Skin is warm and dry. No rash noted.  Psychiatric: He has a normal mood and affect. His behavior is normal. Judgment and thought content normal.    Lab Results  Component Value Date   WBC 6.0 07/17/2012   HGB 13.0 07/17/2012   HCT 38.9* 07/17/2012   PLT 286.0 07/17/2012   GLUCOSE 118* 01/17/2013   CHOL 188 11/19/2013   TRIG 94.0 11/19/2013   HDL 51.90 11/19/2013   LDLCALC 117* 11/19/2013   ALT 20 07/17/2012   AST 17 07/17/2012   NA 140 01/17/2013   K 4.7 01/17/2013   CL 104 01/17/2013   CREATININE 1.0 01/17/2013   BUN 27* 01/17/2013   CO2 30 01/17/2013   TSH 1.81 07/17/2012   PSA 0.50 07/17/2012   INR 1.08 02/02/2011   HGBA1C 11.6* 11/19/2013   MICROALBUR 138.8 Verified by manual dilution.* 01/30/2014         Assessment & Plan:

## 2014-03-13 NOTE — Assessment & Plan Note (Signed)
F/u w/Dr Loanne Drilling IDDM 7/15 worse

## 2014-03-13 NOTE — Patient Instructions (Addendum)
Please continue to pursue the weight-loss surgery.  It helps you live longer.   Please stop taking the humalog, and: Please increase the lantus to 120 units each morning.   check your blood sugar twice a day.  vary the time of day when you check, between before the 3 meals, and at bedtime.  also check if you have symptoms of your blood sugar being too high or too low.  please keep a record of the readings and bring it to your next appointment here.  You can write it on any piece of paper.  please call us sooner if your blood sugar goes below 70, or if you have a lot of readings over 200.   Please come back for a follow-up appointment in 1 month.   Please see Dr Alain Marion for your blood pressure, as it is high today.

## 2014-03-13 NOTE — Assessment & Plan Note (Signed)
Chronic, refractory 

## 2014-03-13 NOTE — Progress Notes (Signed)
Pre visit review using our clinic review tool, if applicable. No additional management support is needed unless otherwise documented below in the visit note. 

## 2014-03-13 NOTE — Patient Instructions (Signed)
Weight watchers Zola's diet [place]

## 2014-04-10 ENCOUNTER — Encounter: Payer: Self-pay | Admitting: Endocrinology

## 2014-04-10 ENCOUNTER — Ambulatory Visit (INDEPENDENT_AMBULATORY_CARE_PROVIDER_SITE_OTHER): Payer: Managed Care, Other (non HMO) | Admitting: Endocrinology

## 2014-04-10 VITALS — BP 113/57 | HR 74 | Temp 98.3°F | Ht 69.0 in | Wt 285.0 lb

## 2014-04-10 DIAGNOSIS — E1165 Type 2 diabetes mellitus with hyperglycemia: Secondary | ICD-10-CM

## 2014-04-10 DIAGNOSIS — IMO0002 Reserved for concepts with insufficient information to code with codable children: Secondary | ICD-10-CM

## 2014-04-10 NOTE — Patient Instructions (Addendum)
Please continue to pursue the weight-loss surgery.   Please increase the lantus to 130 units each morning.   check your blood sugar twice a day.  vary the time of day when you check, between before the 3 meals, and at bedtime.  also check if you have symptoms of your blood sugar being too high or too low.  please keep a record of the readings and bring it to your next appointment here.  You can write it on any piece of paper.  please call us sooner if your blood sugar goes below 70, or if you have a lot of readings over 200.   On this type of insulin schedule, you should eat meals on a regular schedule.  If a meal is missed or significantly delayed, your blood sugar could go low.   Please come back for a follow-up appointment in 2 months.

## 2014-04-10 NOTE — Progress Notes (Signed)
Subjective:    Patient ID: [name] [name], male    DOB: [birth year], 63 y.o.   MRN: [id number]  HPI Pt returns for f/u of diabetes mellitus: DM type: insulin-requiring type 2 Dx'ed: 2248 Complications: sensory neuropathy of the lower extremities, nephropathy, and CAD Therapy: insulin since 2001 DKA: never Severe hypoglycemia: never Pancreatitis: never Other: he changed to qd insulin, as he could not remember to take multiple daily injections.   Interval history:  no cbg record, but states cbg's vary from 160-210.  There is no trend throughout the day. pt states he feels well in general.  He is still considering weight-loss surgery.  Past Medical History  Diagnosis Date  . OSA (obstructive sleep apnea)     cpap  . Hypertension   . Hyperlipidemia   . Diabetes mellitus, type 2   . Exogenous obesity   . Diverticulosis of colon   . Testicular hypofunction   . Erectile dysfunction   . Cervical disc disease   . Bursitis of left shoulder   . History of anemia     Past Surgical History  Procedure Laterality Date  . Posterior laminectomy / decompression cervical spine      c5-6 fusion 12/08 Dr. Consuello Masse  . S/p cervical disc surgery and fusion  10/09    Dr. Consuello Masse    History   Social History  . Marital Status: Married    Spouse Name: N/A    Number of Children: 1  . Years of Education: N/A   Occupational History  . 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 Smoking status: Former Smoker    Quit date: 01/20/1981  . Smokeless tobacco: Never Used  . Alcohol Use: 3.9 oz/week    4 Glasses of wine, 3 Not specified per week  . Drug Use: No  . Sexual Activity: Yes   Other Topics Concern  . Not on file   Social History Narrative    Current Outpatient Prescriptions on File Prior to Visit  Medication Sig Dispense Refill  . alfuzosin (UROXATRAL) 10 MG 24 hr tablet Take 1 tablet (10 mg total) by mouth daily. 90  tablet 3  . amLODipine (NORVASC) 10 MG tablet Take 1 tablet (10 mg total) by mouth daily. 90 tablet 3  . aspirin 81 MG tablet Take 81 mg by mouth daily.      . Blood Glucose Monitoring Suppl (ONETOUCH VERIO [facility] SYSTEM) W/DEVICE KIT 1 Device by Does not apply route once. 1 kit 0  . cholecalciferol (VITAMIN D) 1000 UNITS tablet Take 1 tablet (1,000 Units total) by mouth daily. 100 tablet 3  . furosemide (LASIX) 40 MG tablet Take 1 tablet (40 mg total) by mouth daily. 90 tablet 3  . glucose blood (ONETOUCH VERIO) test strip 1 each by Other route 2 (two) times daily. And lancets 2/day 250.61 100 each 12  . hydrocortisone 2.5 % cream     . Insulin Glargine (LANTUS) 100 UNIT/ML Solostar Pen Inject 120 Units into the skin every morning. (Patient taking differently: Inject 130 Units into the skin every morning. ) 135 mL 3  . Insulin Pen Needle 31G X 8 MM MISC Use once daily as directed 100 each 6  . losartan (COZAAR) 100 MG tablet Take 1 tablet (100 mg total) by mouth daily. 90 tablet 3  . metoprolol succinate (TOPROL XL) 25 MG 24 hr tablet Take 1 tablet (25 mg total) by mouth daily.  90 tablet 3  . rosuvastatin (CRESTOR) 20 MG tablet Take 1 tablet (20 mg total) by mouth daily. 90 tablet 3   No current facility-administered medications on file prior to visit.    Allergies  Allergen Reactions  . Amoxicillin Nausea And Vomiting  . Levofloxacin     REACTION: unspecified  . Lisinopril     Cough   . Penicillins     Per pt: unknown  . Simvastatin     myalgia    Family History  Problem Relation Age of Onset  . Heart disease Father     Died of MI at age 23  . Breast cancer Mother   . Colon cancer Neg Hx   . Diabetes Paternal Grandmother     BP 113/57 mmHg  Pulse 74  Temp(Src) 98.3 F (36.8 C) (Oral)  Ht '5\' 9"'  (1.753 m)  Wt 285 lb (129.275 kg)  BMI 42.07 kg/m2  SpO2 97%    Review of Systems He denies hypoglycemia.  He has gained weight.     Objective:   Physical Exam VITAL  SIGNS:  See vs page GENERAL: no distress EXTEMITIES: no deformity.  2+ bilat leg edema, and erythema of the anterior tibial areas.   SKIN: no ulcer on the feet. feet are of normal color and temp. PULSES: dorsalis pedis intact bilat.   NEURO: sensation is intact to touch on the feet, but decreased from normal.         Assessment & Plan:  DM: moderate exacerbation Obesity, persistent. Noncompliance with cbg recording: I'll work around this as best I can.  Patient is advised the following: Patient Instructions  Please continue to pursue the weight-loss surgery.   Please increase the lantus to 130 units each morning.   check your blood sugar twice a day.  vary the time of day when you check, between before the 3 meals, and at bedtime.  also check if you have symptoms of your blood sugar being too high or too low.  please keep a record of the readings and bring it to your next appointment here.  You can write it on any piece of paper.  please call us sooner if your blood sugar goes below 70, or if you have a lot of readings over 200.   On this type of insulin schedule, you should eat meals on a regular schedule.  If a meal is missed or significantly delayed, your blood sugar could go low.   Please come back for a follow-up appointment in 2 months.

## 2014-06-14 ENCOUNTER — Emergency Department (HOSPITAL_COMMUNITY)
Admission: EM | Admit: 2014-06-14 | Discharge: 2014-06-14 | Disposition: A | Discharge status: Home / Self Care | Payer: Managed Care, Other (non HMO) | Attending: Emergency Medicine | Admitting: Emergency Medicine

## 2014-06-14 ENCOUNTER — Emergency Department (HOSPITAL_COMMUNITY): Payer: Managed Care, Other (non HMO)

## 2014-06-14 ENCOUNTER — Encounter (HOSPITAL_COMMUNITY): Payer: Self-pay

## 2014-06-14 DIAGNOSIS — Z8739 Personal history of other diseases of the musculoskeletal system and connective tissue: Secondary | ICD-10-CM | POA: Diagnosis not present

## 2014-06-14 DIAGNOSIS — Z79899 Other long term (current) drug therapy: Secondary | ICD-10-CM | POA: Insufficient documentation

## 2014-06-14 DIAGNOSIS — E669 Obesity, unspecified: Secondary | ICD-10-CM | POA: Diagnosis not present

## 2014-06-14 DIAGNOSIS — Z794 Long term (current) use of insulin: Secondary | ICD-10-CM | POA: Diagnosis not present

## 2014-06-14 DIAGNOSIS — Z862 Personal history of diseases of the blood and blood-forming organs and certain disorders involving the immune mechanism: Secondary | ICD-10-CM | POA: Diagnosis not present

## 2014-06-14 DIAGNOSIS — Z87891 Personal history of nicotine dependence: Secondary | ICD-10-CM | POA: Insufficient documentation

## 2014-06-14 DIAGNOSIS — Z880 Allergy status to penicillin: Secondary | ICD-10-CM | POA: Insufficient documentation

## 2014-06-14 DIAGNOSIS — Z8719 Personal history of other diseases of the digestive system: Secondary | ICD-10-CM | POA: Diagnosis not present

## 2014-06-14 DIAGNOSIS — R1032 Left lower quadrant pain: Secondary | ICD-10-CM | POA: Insufficient documentation

## 2014-06-14 DIAGNOSIS — Z7952 Long term (current) use of systemic steroids: Secondary | ICD-10-CM | POA: Diagnosis not present

## 2014-06-14 DIAGNOSIS — E119 Type 2 diabetes mellitus without complications: Secondary | ICD-10-CM | POA: Diagnosis not present

## 2014-06-14 DIAGNOSIS — I10 Essential (primary) hypertension: Secondary | ICD-10-CM | POA: Insufficient documentation

## 2014-06-14 DIAGNOSIS — R109 Unspecified abdominal pain: Secondary | ICD-10-CM

## 2014-06-14 DIAGNOSIS — E785 Hyperlipidemia, unspecified: Secondary | ICD-10-CM | POA: Insufficient documentation

## 2014-06-14 DIAGNOSIS — Z87448 Personal history of other diseases of urinary system: Secondary | ICD-10-CM | POA: Diagnosis not present

## 2014-06-14 LAB — BASIC METABOLIC PANEL
Anion gap: 8 (ref 5–15)
BUN: 25 mg/dL — ABNORMAL HIGH (ref 6–23)
CHLORIDE: 102 mmol/L (ref 96–112)
CO2: 26 mmol/L (ref 19–32)
Calcium: 8.9 mg/dL (ref 8.4–10.5)
Creatinine, Ser: 1.11 mg/dL (ref 0.50–1.35)
GFR calc Af Amer: 80 mL/min — ABNORMAL LOW (ref >90)
GFR, EST NON AFRICAN AMERICAN: 69 mL/min — AB (ref >90)
GLUCOSE: 266 mg/dL — AB (ref 70–99)
Potassium: 4 mmol/L (ref 3.5–5.1)
SODIUM: 136 mmol/L (ref 135–145)

## 2014-06-14 LAB — URINE MICROSCOPIC-ADD ON

## 2014-06-14 LAB — CBC WITH DIFFERENTIAL/PLATELET
Basophils Absolute: 0 10*3/uL (ref 0.0–0.1)
Basophils Relative: 0 % (ref 0–1)
Eosinophils Absolute: 0.2 10*3/uL (ref 0.0–0.7)
Eosinophils Relative: 3 % (ref 0–5)
HCT: 45.1 % (ref 39.0–52.0)
Hemoglobin: 15.1 g/dL (ref 13.0–17.0)
LYMPHS ABS: 1.3 10*3/uL (ref 0.7–4.0)
LYMPHS PCT: 20 % (ref 12–46)
MCH: 27.1 pg (ref 26.0–34.0)
MCHC: 33.5 g/dL (ref 30.0–36.0)
MCV: 81 fL (ref 78.0–100.0)
MONOS PCT: 6 % (ref 3–12)
Monocytes Absolute: 0.4 10*3/uL (ref 0.1–1.0)
NEUTROS ABS: 4.6 10*3/uL (ref 1.7–7.7)
NEUTROS PCT: 71 % (ref 43–77)
Platelets: 265 10*3/uL (ref 150–400)
RBC: 5.57 MIL/uL (ref 4.22–5.81)
RDW: 13.9 % (ref 11.5–15.5)
WBC: 6.6 10*3/uL (ref 4.0–10.5)

## 2014-06-14 LAB — URINALYSIS, ROUTINE W REFLEX MICROSCOPIC
Bilirubin Urine: NEGATIVE
Glucose, UA: >1000 mg/dL — AB
Ketones, ur: NEGATIVE mg/dL
Leukocytes, UA: NEGATIVE
NITRITE: NEGATIVE
Protein, ur: >300 mg/dL — AB
Specific Gravity, Urine: 1.026 (ref 1.005–1.030)
Urobilinogen, UA: 0.2 mg/dL (ref 0.0–1.0)
pH: 6 (ref 5.0–8.0)

## 2014-06-14 LAB — I-STAT CG4 LACTIC ACID, ED: LACTIC ACID, VENOUS: 0.88 mmol/L (ref 0.5–2.0)

## 2014-06-14 MED ORDER — OXYCODONE-ACETAMINOPHEN 5-325 MG PO TABS: 2.0000 | ORAL_TABLET | ORAL | Status: DC | PRN

## 2014-06-14 MED ORDER — DICYCLOMINE HCL 20 MG PO TABS: 20.0000 mg | ORAL_TABLET | Freq: Four times a day (QID) | ORAL | Status: DC | PRN

## 2014-06-14 MED ORDER — HYDROMORPHONE HCL 1 MG/ML IJ SOLN
0.5000 mg | Freq: Once | INTRAMUSCULAR | Status: AC
  Administered 2014-06-14: 0.5 mg via INTRAVENOUS
  Filled 2014-06-14: qty 1

## 2014-06-14 MED ORDER — MORPHINE SULFATE 4 MG/ML IJ SOLN
INTRAMUSCULAR | Status: AC
  Filled 2014-06-14: qty 1

## 2014-06-14 MED ORDER — MORPHINE SULFATE 4 MG/ML IJ SOLN
4.0000 mg | Freq: Once | INTRAMUSCULAR | Status: AC
  Administered 2014-06-14: 4 mg via INTRAVENOUS

## 2014-06-14 MED ORDER — ONDANSETRON 8 MG PO TBDP: 8.0000 mg | ORAL_TABLET | Freq: Three times a day (TID) | ORAL | Status: DC | PRN

## 2014-06-14 MED ORDER — ONDANSETRON HCL 4 MG/2ML IJ SOLN
INTRAMUSCULAR | Status: AC
  Filled 2014-06-14: qty 2

## 2014-06-14 MED ORDER — PROMETHAZINE HCL 25 MG/ML IJ SOLN
12.5000 mg | Freq: Once | INTRAMUSCULAR | Status: AC
  Administered 2014-06-14: 12.5 mg via INTRAVENOUS
  Filled 2014-06-14: qty 1

## 2014-06-14 MED ORDER — ONDANSETRON HCL 4 MG/2ML IJ SOLN
4.0000 mg | Freq: Once | INTRAMUSCULAR | Status: AC
  Administered 2014-06-14: 4 mg via INTRAVENOUS
  Filled 2014-06-14: qty 2

## 2014-06-14 MED ORDER — KETOROLAC TROMETHAMINE 30 MG/ML IJ SOLN
30.0000 mg | Freq: Once | INTRAMUSCULAR | Status: AC
  Administered 2014-06-14: 30 mg via INTRAVENOUS
  Filled 2014-06-14: qty 1

## 2014-06-14 MED ORDER — DICYCLOMINE HCL 10 MG/ML IM SOLN
20.0000 mg | Freq: Once | INTRAMUSCULAR | Status: AC
  Administered 2014-06-14: 20 mg via INTRAMUSCULAR
  Filled 2014-06-14: qty 2

## 2014-06-14 NOTE — ED Provider Notes (Signed)
CSN: [id number]     Arrival date & time 06/14/14  0315 History   First MD Initiated Contact with Patient 06/14/14 [phone]     Chief Complaint  Patient presents with  . Flank Pain     (Consider location/radiation/quality/duration/timing/severity/associated sxs/prior Treatment) HPI 64 year old male presents emergency department with complaint of left lower abdominal and flank pain starting several hours ago.  Patient has had nausea without vomiting.  Patient denies previous history of similar pain.  He attempted to have a bowel movement to relieve gas, which did not help symptoms.  Patient has history of hypertension, hyperlipidemia, diabetes, diverticulosis.  He reports he is unable to get into a comfortable position.  He prefers to stand or pace to help with pain.  No fevers no chills. Past Medical History  Diagnosis Date  . OSA (obstructive sleep apnea)     cpap  . Hypertension   . Hyperlipidemia   . Diabetes mellitus, type 2   . Exogenous obesity   . Diverticulosis of colon   . Testicular hypofunction   . Erectile dysfunction   . Cervical disc disease   . Bursitis of left shoulder   . History of anemia    Past Surgical History  Procedure Laterality Date  . Posterior laminectomy / decompression cervical spine      c5-6 fusion 12/08 Dr. Consuello Masse  . S/p cervical disc surgery and fusion  10/09    Dr. Consuello Masse   Family History  Problem Relation Age of Onset  . Heart disease Father     Died of MI at age 57  . Breast cancer Mother   . Colon cancer Neg Hx   . Diabetes Paternal Grandmother    History  Substance Use Topics  . Smoking status: Former Smoker    Quit date: 01/20/1981  . Smokeless tobacco: Never Used  . Alcohol Use: 3.9 oz/week    4 Glasses of wine, 3 Not specified per week    Review of Systems   See History of Present Illness; otherwise all other systems are reviewed and negative  Allergies  Amoxicillin; Levofloxacin; Lisinopril; Penicillins; and Simvastatin  Home  Medications   Prior to Admission medications   Medication Sig Start Date End Date Taking? Authorizing Provider  alfuzosin (UROXATRAL) 10 MG 24 hr tablet Take 1 tablet (10 mg total) by mouth daily. 08/15/13   Aleksei Plotnikov V, MD  amLODipine (NORVASC) 10 MG tablet Take 1 tablet (10 mg total) by mouth daily. 08/15/13   Aleksei Plotnikov V, MD  aspirin 81 MG tablet Take 81 mg by mouth daily.      Historical Provider, MD  Blood Glucose Monitoring Suppl (ONETOUCH VERIO Rienzi) W/DEVICE KIT 1 Device by Does not apply route once. 12/21/13   Renato Shin, MD  cholecalciferol (VITAMIN D) 1000 UNITS tablet Take 1 tablet (1,000 Units total) by mouth daily. 08/15/13 08/15/14  Aleksei Plotnikov V, MD  furosemide (LASIX) 40 MG tablet Take 1 tablet (40 mg total) by mouth daily. 08/15/13   Aleksei Plotnikov V, MD  glucose blood (ONETOUCH VERIO) test strip 1 each by Other route 2 (two) times daily. And lancets 2/day 250.61 12/21/13   Renato Shin, MD  hydrocortisone 2.5 % cream  01/17/13   Historical Provider, MD  Insulin Glargine (LANTUS) 100 UNIT/ML Solostar Pen Inject 120 Units into the skin every morning. Patient taking differently: Inject 130 Units into the skin every morning.  03/13/14   Renato Shin, MD  Insulin Pen Needle 31G X 8 MM  MISC Use once daily as directed 08/15/13   Tyrone Apple Plotnikov V, MD  losartan (COZAAR) 100 MG tablet Take 1 tablet (100 mg total) by mouth daily. 08/15/13   Aleksei Plotnikov V, MD  metoprolol succinate (TOPROL XL) 25 MG 24 hr tablet Take 1 tablet (25 mg total) by mouth daily. 08/15/13 08/28/14  Aleksei Plotnikov V, MD  rosuvastatin (CRESTOR) 20 MG tablet Take 1 tablet (20 mg total) by mouth daily. 08/15/13   Aleksei Plotnikov V, MD   BP 178/78 mmHg  Pulse 70  Temp(Src) 97.8 F (36.6 C) (Oral)  Resp 20  Ht '5\' 9"'  (1.753 m)  Wt 285 lb (129.275 kg)  BMI 42.07 kg/m2  SpO2 100% Physical Exam  Constitutional: He is oriented to person, place, and time. He appears well-developed and  well-nourished. He appears distressed (uncomfortable appearing).  HENT:  Head: Normocephalic and atraumatic.  Nose: Nose normal.  Mouth/Throat: Oropharynx is clear and moist.  Eyes: Conjunctivae and EOM are normal. Pupils are equal, round, and reactive to light.  Neck: Normal range of motion. Neck supple. No JVD present. No tracheal deviation present. No thyromegaly present.  Cardiovascular: Normal rate, regular rhythm, normal heart sounds and intact distal pulses.  Exam reveals no gallop and no friction rub.   No murmur heard. Pulmonary/Chest: Effort normal and breath sounds normal. No stridor. No respiratory distress. He has no wheezes. He has no rales. He exhibits no tenderness.  Abdominal: Soft. Bowel sounds are normal. He exhibits no distension and no mass. There is no tenderness. There is no rebound and no guarding.  Musculoskeletal: Normal range of motion. He exhibits no edema or tenderness.  Lymphadenopathy:    He has no cervical adenopathy.  Neurological: He is alert and oriented to person, place, and time. He displays normal reflexes. He exhibits normal muscle tone. Coordination normal.  Skin: Skin is warm and dry. No rash noted. No erythema. No pallor.  Psychiatric: He has a normal mood and affect. His behavior is normal. Judgment and thought content normal.  Nursing note and vitals reviewed.   ED Course  Procedures (including critical care time) Labs Review Labs Reviewed  BASIC METABOLIC PANEL - Abnormal; Notable for the following:    Glucose, Bld 266 (*)    BUN 25 (*)    GFR calc non Af Amer 69 (*)    GFR calc Af Amer 80 (*)    All other components within normal limits  URINALYSIS, ROUTINE W REFLEX MICROSCOPIC - Abnormal; Notable for the following:    Glucose, UA >1000 (*)    Hgb urine dipstick TRACE (*)    Protein, ur >300 (*)    All other components within normal limits  URINE MICROSCOPIC-ADD ON - Abnormal; Notable for the following:    Casts HYALINE CASTS (*)     All other components within normal limits  CBC WITH DIFFERENTIAL/PLATELET  I-STAT CG4 LACTIC ACID, ED    Imaging Review Ct Renal Stone Study  06/14/2014   CLINICAL DATA:  LEFT flank pain and nausea. No hematuria. History of diabetes, hypertension, diverticulosis.  EXAM: CT ABDOMEN AND PELVIS WITHOUT CONTRAST  TECHNIQUE: Multidetector CT imaging of the abdomen and pelvis was performed following the standard protocol without IV contrast.  COMPARISON:  CT of the abdomen and pelvis February 22, 2006  FINDINGS: LUNG BASES: Included view of the lung bases demonstrates LEFT lower lobe atelectasis/scarring. Visualized heart and pericardium are unremarkable.  SOLID ORGANS: The spleen, gallbladder, pancreas and adrenal glands are unremarkable. Hepatic granulomas,  liver is otherwise unremarkable.  GASTROINTESTINAL TRACT: The stomach, small and large bowel are normal in course and caliber without inflammatory changes. Mild sigmoid diverticulosis. Normal appendix.  KIDNEYS/ URINARY TRACT: Kidneys are orthotopic, demonstrating symmetric enhancement. 6 mm LEFT interpolar nephrolithiasis. No hydronephrosis or solid renal masses. The unopacified ureters are normal in course and caliber. Delayed imaging through the kidneys demonstrates symmetric prompt contrast excretion within the proximal urinary collecting system. Urinary bladder is partially distended and unremarkable.  PERITONEUM/RETROPERITONEUM: Aortoiliac vessels are normal in course and caliber, mild calcific atherosclerosis. No lymphadenopathy by CT size criteria. Internal reproductive organs are unremarkable. No intraperitoneal free fluid nor free air.  SOFT TISSUE/OSSEOUS STRUCTURES: Non-suspicious. Small fat containing inguinal hernias. Moderate fat containing umbilical hernia.  IMPRESSION: Nonobstructing 6 mm LEFT interpolar nephrolithiasis.  No acute intra-abdominal or pelvic process.   Electronically Signed   By: Elon Alas   On: 06/14/2014 04:54     EKG  Interpretation None      MDM   Final diagnoses:  Left flank pain    64 year old male with acute onset of left flank and left abdominal pain.  Suspect kidney stone.  Other possible diagnosis would be diverticulitis, bowel ischemia, aortic dissection or aneurysm.  Plan for labs, urine, CT without contrast.  6:19 AM Patient with persistent pain.  Urine has returned without significant hematuria, CT scan with left interpolar nephrolithiasis.  No signs of recently passed stone.  Patient with persistent pain on palpation to left lower quadrant.  Discussed with radiology if giving contrast would help assist in diagnosing diverticulitis.  Per radiology, given patient's size they are confident in saying that he has no intra-abdominal or pelvic process noted on CT scan, regardless of contrast.  No signs of aortic iliac issues.  Will give patient Bentyl, toradol to help with abdominal cramping, and check lactate.  Kalman Drape, MD 06/15/14 0800

## 2014-06-14 NOTE — ED Notes (Signed)
Pt states that he woke up with left flank pain this am, he also feels nauseated, no hematuria

## 2014-06-14 NOTE — ED Provider Notes (Signed)
Patient suffered left flank pain with nausea last night. He is presently asymptomatic since treatment here. Feels ready to go home. On exam alert no distress lungs clear to auscultation heart regular rate and rhythm abdomen obese nontender. No flank tenderness. Patient instructed to call Dr.Plotnikov today to arrange to be seen in office next week. Blood pressure recheck one week. Results for orders placed or performed during the hospital encounter of 40/98/11  Basic metabolic panel  Result Value Ref Range   Sodium 136 135 - 145 mmol/L   Potassium 4.0 3.5 - 5.1 mmol/L   Chloride 102 96 - 112 mmol/L   CO2 26 19 - 32 mmol/L   Glucose, Bld 266 (H) 70 - 99 mg/dL   BUN 25 (H) 6 - 23 mg/dL   Creatinine, Ser 1.11 0.50 - 1.35 mg/dL   Calcium 8.9 8.4 - 10.5 mg/dL   GFR calc non Af Amer 69 (L) >90 mL/min   GFR calc Af Amer 80 (L) >90 mL/min   Anion gap 8 5 - 15  Urinalysis, Routine w reflex microscopic  Result Value Ref Range   Color, Urine YELLOW YELLOW   APPearance CLEAR CLEAR   Specific Gravity, Urine 1.026 1.005 - 1.030   pH 6.0 5.0 - 8.0   Glucose, UA >1000 (A) NEGATIVE mg/dL   Hgb urine dipstick TRACE (A) NEGATIVE   Bilirubin Urine NEGATIVE NEGATIVE   Ketones, ur NEGATIVE NEGATIVE mg/dL   Protein, ur >300 (A) NEGATIVE mg/dL   Urobilinogen, UA 0.2 0.0 - 1.0 mg/dL   Nitrite NEGATIVE NEGATIVE   Leukocytes, UA NEGATIVE NEGATIVE  CBC with Differential  Result Value Ref Range   WBC 6.6 4.0 - 10.5 K/uL   RBC 5.57 4.22 - 5.81 MIL/uL   Hemoglobin 15.1 13.0 - 17.0 g/dL   HCT 45.1 39.0 - 52.0 %   MCV 81.0 78.0 - 100.0 fL   MCH 27.1 26.0 - 34.0 pg   MCHC 33.5 30.0 - 36.0 g/dL   RDW 13.9 11.5 - 15.5 %   Platelets 265 150 - 400 K/uL   Neutrophils Relative % 71 43 - 77 %   Neutro Abs 4.6 1.7 - 7.7 K/uL   Lymphocytes Relative 20 12 - 46 %   Lymphs Abs 1.3 0.7 - 4.0 K/uL   Monocytes Relative 6 3 - 12 %   Monocytes Absolute 0.4 0.1 - 1.0 K/uL   Eosinophils Relative 3 0 - 5 %   Eosinophils  Absolute 0.2 0.0 - 0.7 K/uL   Basophils Relative 0 0 - 1 %   Basophils Absolute 0.0 0.0 - 0.1 K/uL  Urine microscopic-add on  Result Value Ref Range   RBC / HPF 0-2 <3 RBC/hpf   Casts HYALINE CASTS (A) NEGATIVE  I-Stat CG4 Lactic Acid, ED  Result Value Ref Range   Lactic Acid, Venous 0.88 0.5 - 2.0 mmol/L   Ct Renal Stone Study  06/14/2014   CLINICAL DATA:  LEFT flank pain and nausea. No hematuria. History of diabetes, hypertension, diverticulosis.  EXAM: CT ABDOMEN AND PELVIS WITHOUT CONTRAST  TECHNIQUE: Multidetector CT imaging of the abdomen and pelvis was performed following the standard protocol without IV contrast.  COMPARISON:  CT of the abdomen and pelvis February 22, 2006  FINDINGS: LUNG BASES: Included view of the lung bases demonstrates LEFT lower lobe atelectasis/scarring. Visualized heart and pericardium are unremarkable.  SOLID ORGANS: The spleen, gallbladder, pancreas and adrenal glands are unremarkable. Hepatic granulomas, liver is otherwise unremarkable.  GASTROINTESTINAL TRACT: The stomach,  small and large bowel are normal in course and caliber without inflammatory changes. Mild sigmoid diverticulosis. Normal appendix.  KIDNEYS/ URINARY TRACT: Kidneys are orthotopic, demonstrating symmetric enhancement. 6 mm LEFT interpolar nephrolithiasis. No hydronephrosis or solid renal masses. The unopacified ureters are normal in course and caliber. Delayed imaging through the kidneys demonstrates symmetric prompt contrast excretion within the proximal urinary collecting system. Urinary bladder is partially distended and unremarkable.  PERITONEUM/RETROPERITONEUM: Aortoiliac vessels are normal in course and caliber, mild calcific atherosclerosis. No lymphadenopathy by CT size criteria. Internal reproductive organs are unremarkable. No intraperitoneal free fluid nor free air.  SOFT TISSUE/OSSEOUS STRUCTURES: Non-suspicious. Small fat containing inguinal hernias. Moderate fat containing umbilical  hernia.  IMPRESSION: Nonobstructing 6 mm LEFT interpolar nephrolithiasis.  No acute intra-abdominal or pelvic process.   Electronically Signed   By: Elon Alas   On: 06/14/2014 04:54     Orlie Dakin, MD 06/14/14 [phone]

## 2014-06-14 NOTE — ED Notes (Signed)
Pt tolerated fluids w/o issue.

## 2014-06-14 NOTE — ED Notes (Signed)
MD at bedside. 

## 2014-06-14 NOTE — Discharge Instructions (Signed)
Your workup today has not shown a specific cause for your abdominal pain.  Your CT scan has not shown any inflammation in your intestines, you have a 6 mm stone within the left kidney that is not currently causing any problems.  Your urine does not show any signs of infection.  Take medications as prescribed.  Follow-up with your doctor for recheck in 3-5 days.  Return to the emergency department for worsening condition or new concerning symptoms. Get your blood pressure rechecked next week. Today's was  elevated at 170/67. Blood sugar today was elevated at 266.    Abdominal Pain Many things can cause abdominal pain. Usually, abdominal pain is not caused by a disease and will improve without treatment. It can often be observed and treated at home. Your health care provider will do a physical exam and possibly order blood tests and X-rays to help determine the seriousness of your pain. However, in many cases, more time must pass before a clear cause of the pain can be found. Before that point, your health care provider may not know if you need more testing or further treatment. HOME CARE INSTRUCTIONS  Monitor your abdominal pain for any changes. The following actions may help to alleviate any discomfort you are experiencing:  Only take over-the-counter or prescription medicines as directed by your health care provider.  Do not take laxatives unless directed to do so by your health care provider.  Try a clear liquid diet (broth, tea, or water) as directed by your health care provider. Slowly move to a bland diet as tolerated. SEEK MEDICAL CARE IF:  You have unexplained abdominal pain.  You have abdominal pain associated with nausea or diarrhea.  You have pain when you urinate or have a bowel movement.  You experience abdominal pain that wakes you in the night.  You have abdominal pain that is worsened or improved by eating food.  You have abdominal pain that is worsened with eating fatty  foods.  You have a fever. SEEK IMMEDIATE MEDICAL CARE IF:   Your pain does not go away within 2 hours.  You keep throwing up (vomiting).  Your pain is felt only in portions of the abdomen, such as the right side or the left lower portion of the abdomen.  You pass bloody or black tarry stools. MAKE SURE YOU:  Understand these instructions.   Will watch your condition.   Will get help right away if you are not doing well or get worse.  Document Released: 02/03/2005 Document Revised: 05/01/2013 Document Reviewed: 01/03/2013 [facility] Patient Information 2015 [place], [place]. This information is not intended to replace advice given to you by your health care provider. Make sure you discuss any questions you have with your health care provider.  Flank Pain Flank pain refers to pain that is located on the side of the body between the upper abdomen and the back. The pain may occur over a short period of time (acute) or may be long-term or reoccurring (chronic). It may be mild or severe. Flank pain can be caused by many things. CAUSES  Some of the more common causes of flank pain include:  Muscle strains.   Muscle spasms.   A disease of your spine (vertebral disk disease).   A lung infection (pneumonia).   Fluid around your lungs (pulmonary edema).   A kidney infection.   Kidney stones.   A very painful skin rash caused by the chickenpox virus (shingles).   Gallbladder disease.  HOME  CARE INSTRUCTIONS  Home care will depend on the cause of your pain. In general,  Rest as directed by your caregiver.  Drink enough fluids to keep your urine clear or pale yellow.  Only take over-the-counter or prescription medicines as directed by your caregiver. Some medicines may help relieve the pain.  Tell your caregiver about any changes in your pain.  Follow up with your caregiver as directed. SEEK IMMEDIATE MEDICAL CARE IF:   Your pain is not controlled with medicine.    You have new or worsening symptoms.  Your pain increases.   You have abdominal pain.   You have shortness of breath.   You have persistent nausea or vomiting.   You have swelling in your abdomen.   You feel faint or pass out.   You have blood in your urine.  You have a fever or persistent symptoms for more than 2-3 days.  You have a fever and your symptoms suddenly get worse. MAKE SURE YOU:   Understand these instructions.  Will watch your condition.  Will get help right away if you are not doing well or get worse. Document Released: 06/17/2005 Document Revised: 01/19/2012 Document Reviewed: 12/09/2011 [facility] Patient Information 2015 [place], [place]. This information is not intended to replace advice given to you by your health care provider. Make sure you discuss any questions you have with your health care provider.

## 2014-06-17 ENCOUNTER — Ambulatory Visit (INDEPENDENT_AMBULATORY_CARE_PROVIDER_SITE_OTHER): Payer: Managed Care, Other (non HMO) | Admitting: Internal Medicine

## 2014-06-17 ENCOUNTER — Other Ambulatory Visit (INDEPENDENT_AMBULATORY_CARE_PROVIDER_SITE_OTHER): Payer: Managed Care, Other (non HMO)

## 2014-06-17 VITALS — BP 160/80 | HR 83 | Temp 98.8°F | Wt 285.0 lb

## 2014-06-17 DIAGNOSIS — N200 Calculus of kidney: Secondary | ICD-10-CM

## 2014-06-17 DIAGNOSIS — IMO0002 Reserved for concepts with insufficient information to code with codable children: Secondary | ICD-10-CM

## 2014-06-17 DIAGNOSIS — E1165 Type 2 diabetes mellitus with hyperglycemia: Secondary | ICD-10-CM

## 2014-06-17 DIAGNOSIS — R1032 Left lower quadrant pain: Secondary | ICD-10-CM

## 2014-06-17 LAB — BASIC METABOLIC PANEL
BUN: 17 mg/dL (ref 6–23)
CHLORIDE: 98 meq/L (ref 96–112)
CO2: 29 meq/L (ref 19–32)
Calcium: 9.3 mg/dL (ref 8.4–10.5)
Creatinine, Ser: 1.04 mg/dL (ref 0.40–1.50)
GFR: 76.46 mL/min (ref >60.00)
Glucose, Bld: 231 mg/dL — ABNORMAL HIGH (ref 70–99)
POTASSIUM: 3.9 meq/L (ref 3.5–5.1)
Sodium: 135 mEq/L (ref 135–145)

## 2014-06-17 LAB — HEPATIC FUNCTION PANEL
ALT: 14 U/L (ref 0–53)
AST: 14 U/L (ref 0–37)
Albumin: 3.8 g/dL (ref 3.5–5.2)
Alkaline Phosphatase: 88 U/L (ref 39–117)
BILIRUBIN DIRECT: 0.1 mg/dL (ref 0.0–0.3)
TOTAL PROTEIN: 7.4 g/dL (ref 6.0–8.3)
Total Bilirubin: 0.8 mg/dL (ref 0.2–1.2)

## 2014-06-17 LAB — CBC WITH DIFFERENTIAL/PLATELET
Basophils Absolute: 0 10*3/uL (ref 0.0–0.1)
Basophils Relative: 0.5 % (ref 0.0–3.0)
EOS PCT: 1.6 % (ref 0.0–5.0)
Eosinophils Absolute: 0.1 10*3/uL (ref 0.0–0.7)
HCT: 43.3 % (ref 39.0–52.0)
Hemoglobin: 14.8 g/dL (ref 13.0–17.0)
Lymphocytes Relative: 16.6 % (ref 12.0–46.0)
Lymphs Abs: 1.3 10*3/uL (ref 0.7–4.0)
MCHC: 34.1 g/dL (ref 30.0–36.0)
MCV: 78.1 fl (ref 78.0–100.0)
MONO ABS: 0.4 10*3/uL (ref 0.1–1.0)
MONOS PCT: 5.5 % (ref 3.0–12.0)
NEUTROS PCT: 75.8 % (ref 43.0–77.0)
Neutro Abs: 6 10*3/uL (ref 1.4–7.7)
PLATELETS: 320 10*3/uL (ref 150.0–400.0)
RBC: 5.54 Mil/uL (ref 4.22–5.81)
RDW: 13.9 % (ref 11.5–15.5)
WBC: 7.9 10*3/uL (ref 4.0–10.5)

## 2014-06-17 LAB — LIPID PANEL
Cholesterol: 217 mg/dL — ABNORMAL HIGH (ref 0–200)
HDL: 57.3 mg/dL (ref >39.00)
LDL Cholesterol: 140 mg/dL — ABNORMAL HIGH (ref 0–99)
NonHDL: 159.7
Total CHOL/HDL Ratio: 4
Triglycerides: 98 mg/dL (ref 0.0–149.0)
VLDL: 19.6 mg/dL (ref 0.0–40.0)

## 2014-06-17 LAB — URINALYSIS, ROUTINE W REFLEX MICROSCOPIC
Bilirubin Urine: NEGATIVE
Ketones, ur: NEGATIVE
Leukocytes, UA: NEGATIVE
NITRITE: NEGATIVE
PH: 5.5 (ref 5.0–8.0)
SPECIFIC GRAVITY, URINE: 1.015 (ref 1.000–1.030)
Total Protein, Urine: 100 — AB
URINE GLUCOSE: 500 — AB
UROBILINOGEN UA: 0.2 (ref 0.0–1.0)

## 2014-06-17 LAB — SEDIMENTATION RATE: Sed Rate: 50 mm/hr — ABNORMAL HIGH (ref 0–22)

## 2014-06-17 MED ORDER — OXYCODONE-ACETAMINOPHEN 5-325 MG PO TABS: 1.0000 | ORAL_TABLET | ORAL | Status: DC | PRN

## 2014-06-17 MED ORDER — ALFUZOSIN HCL ER 10 MG PO TB24: 10.0000 mg | ORAL_TABLET | Freq: Every day | ORAL | Status: DC

## 2014-06-17 MED ORDER — KETOROLAC TROMETHAMINE 10 MG PO TABS: 10.0000 mg | ORAL_TABLET | Freq: Three times a day (TID) | ORAL | Status: DC | PRN

## 2014-06-17 MED ORDER — CIPROFLOXACIN HCL 500 MG PO TABS: 500.0000 mg | ORAL_TABLET | Freq: Two times a day (BID) | ORAL | Status: DC

## 2014-06-17 NOTE — Progress Notes (Signed)
Subjective:    HPI  S/p ER visit last Thursday:  "Patient with persistent pain. Urine has returned without significant hematuria, CT scan with left interpolar nephrolithiasis. No signs of recently passed stone. Patient with persistent pain on palpation to left lower quadrant. Discussed with radiology if giving contrast would help assist in diagnosing diverticulitis. Per radiology, given patient's size they are confident in saying that he has no intra-abdominal or pelvic process noted on CT scan, regardless of contrast. No signs of aortic iliac issues. Will give patient Bentyl, toradol to help with abdominal cramping, and check lactate.  Kalman Drape, MD 06/15/14 0800"   C/o pain off and on - worse w/peeing, nausea   The patient presents for a follow-up of  chronic hypertension, chronic dyslipidemia, type 2 diabetes. Seeing Dr Loanne Drilling. F/u leg swelling.   Wt Readings from Last 3 Encounters:  06/17/14 285 lb (129.275 kg)  06/14/14 285 lb (129.275 kg)  04/10/14 285 lb (129.275 kg)   BP Readings from Last 3 Encounters:  06/17/14 160/80  06/14/14 168/78  04/10/14 113/57      Review of Systems  Constitutional: Positive for unexpected weight change. Negative for appetite change and fatigue.  HENT: Negative for congestion, nosebleeds, sneezing, sore throat and trouble swallowing.   Eyes: Positive for visual disturbance. Negative for itching.  Respiratory: Positive for cough.   Cardiovascular: Positive for leg swelling. Negative for chest pain and palpitations.  Gastrointestinal: Negative for nausea, diarrhea, blood in stool and abdominal distention.  Genitourinary: Positive for urgency and frequency. Negative for hematuria.  Musculoskeletal: Negative for back pain, joint swelling, gait problem and neck pain.  Skin: Negative for rash.  Neurological: Negative for dizziness, tremors, speech difficulty and weakness.  Psychiatric/Behavioral: Negative for suicidal ideas, sleep  disturbance, dysphoric mood and agitation. The patient is not nervous/anxious.        Objective:   Physical Exam  Constitutional: He is oriented to person, place, and time. He appears well-developed. No distress.  NAD  HENT:  Mouth/Throat: Oropharynx is clear and moist.  Eyes: Conjunctivae are normal. Pupils are equal, round, and reactive to light.  Neck: Normal range of motion. No JVD present. No thyromegaly present.  Cardiovascular: Normal rate, regular rhythm, normal heart sounds and intact distal pulses.  Exam reveals no gallop and no friction rub.   No murmur heard. Pulmonary/Chest: Effort normal and breath sounds normal. No respiratory distress. He has no wheezes. He has no rales. He exhibits no tenderness.  Abdominal: Soft. Bowel sounds are normal. He exhibits no distension and no mass. There is no tenderness. There is no rebound and no guarding.  Musculoskeletal: Normal range of motion. He exhibits no edema or tenderness.  Lymphadenopathy:    He has no cervical adenopathy.  Neurological: He is alert and oriented to person, place, and time. He has normal reflexes. No cranial nerve deficit. He exhibits normal muscle tone. He displays a negative Romberg sign. Coordination and gait normal.  No meningeal signs  Skin: Skin is warm and dry. No rash noted.  Psychiatric: He has a normal mood and affect. His behavior is normal. Judgment and thought content normal.    Lab Results  Component Value Date   WBC 6.6 06/14/2014   HGB 15.1 06/14/2014   HCT 45.1 06/14/2014   PLT 265 06/14/2014   GLUCOSE 266* 06/14/2014   CHOL 188 11/19/2013   TRIG 94.0 11/19/2013   HDL 51.90 11/19/2013   LDLCALC 117* 11/19/2013   ALT 20 07/17/2012  AST 17 07/17/2012   NA 136 06/14/2014   K 4.0 06/14/2014   CL 102 06/14/2014   CREATININE 1.11 06/14/2014   BUN 25* 06/14/2014   CO2 26 06/14/2014   TSH 1.81 07/17/2012   PSA 0.50 07/17/2012   INR 1.08 02/02/2011   HGBA1C 11.6* 11/19/2013   MICROALBUR  138.8 Verified by manual dilution.* 01/30/2014    Abd CT renal stone study IMPRESSION: Nonobstructing 6 mm LEFT interpolar nephrolithiasis.  No acute intra-abdominal or pelvic process.   Electronically Signed  By: Elon Alas  On: 06/14/2014 04:54   A complex case     Assessment & Plan:  Patient ID: [name] [name], male   DOB: 1951-04-27, 64 y.o.   MRN: [id number]

## 2014-06-17 NOTE — Progress Notes (Signed)
Pre visit review using our clinic review tool, if applicable. No additional management support is needed unless otherwise documented below in the visit note. 

## 2014-06-17 NOTE — Assessment & Plan Note (Signed)
Cipro if fever Re-start Uroxatral Percocet prn Toradol prn Urol ref  Abd CT renal stone study IMPRESSION: Nonobstructing 6 mm LEFT interpolar nephrolithiasis.  No acute intra-abdominal or pelvic process.   Electronically Signed  By: Elon Alas  On: 06/14/2014 04:54

## 2014-06-17 NOTE — Patient Instructions (Signed)

## 2014-06-19 ENCOUNTER — Ambulatory Visit: Payer: Managed Care, Other (non HMO) | Admitting: Endocrinology

## 2014-06-23 DIAGNOSIS — R1032 Left lower quadrant pain: Secondary | ICD-10-CM | POA: Insufficient documentation

## 2014-06-23 NOTE — Assessment & Plan Note (Signed)
?  etiology  Abd CT renal stone study IMPRESSION: Nonobstructing 6 mm LEFT interpolar nephrolithiasis.  No acute intra-abdominal or pelvic process.   Electronically Signed  By: Elon Alas  On: 06/14/2014 04:54  Urol ref

## 2014-06-23 NOTE — Assessment & Plan Note (Signed)
Continue with current prescription therapy as reflected on the Med list.  

## 2014-07-03 ENCOUNTER — Ambulatory Visit: Payer: Managed Care, Other (non HMO) | Admitting: Internal Medicine

## 2014-07-17 ENCOUNTER — Ambulatory Visit: Payer: Managed Care, Other (non HMO) | Admitting: Internal Medicine

## 2014-07-24 ENCOUNTER — Telehealth: Payer: Self-pay | Admitting: Internal Medicine

## 2014-07-24 MED ORDER — HYDROCOD POLST-CHLORPHEN POLST 10-8 MG/5ML PO LQCR: 5.0000 mL | Freq: Two times a day (BID) | ORAL | Status: DC | PRN

## 2014-07-24 NOTE — Telephone Encounter (Signed)
Antoinette from [facility] called stating they can not receive a faxed script. I reprinted Rx/MD signed/upfront for pt. Pt informed

## 2014-07-24 NOTE — Telephone Encounter (Signed)
Patient is requesting chlorpheniramine-HYDROcodone Amanda Cockayne PENNKINETIC ER) 10-8 MG/5ML LQCR to be refilled to [facility] on [address]

## 2014-07-24 NOTE — Telephone Encounter (Signed)
Script fax to [facility]...[name] [name]

## 2014-07-24 NOTE — Addendum Note (Signed)
Addended by: Cresenciano Lick on: 07/24/2014 05:11 PM   Modules accepted: Orders, Medications

## 2014-07-24 NOTE — Telephone Encounter (Signed)
OK to fill this prescription with additional refills x0 Thank you!  

## 2014-07-31 ENCOUNTER — Encounter: Payer: Self-pay | Admitting: Internal Medicine

## 2014-07-31 ENCOUNTER — Other Ambulatory Visit (INDEPENDENT_AMBULATORY_CARE_PROVIDER_SITE_OTHER): Payer: Managed Care, Other (non HMO)

## 2014-07-31 ENCOUNTER — Ambulatory Visit (INDEPENDENT_AMBULATORY_CARE_PROVIDER_SITE_OTHER): Payer: Managed Care, Other (non HMO) | Admitting: Endocrinology

## 2014-07-31 ENCOUNTER — Other Ambulatory Visit: Payer: Managed Care, Other (non HMO)

## 2014-07-31 ENCOUNTER — Ambulatory Visit (INDEPENDENT_AMBULATORY_CARE_PROVIDER_SITE_OTHER): Payer: Managed Care, Other (non HMO) | Admitting: Internal Medicine

## 2014-07-31 ENCOUNTER — Encounter: Payer: Self-pay | Admitting: Endocrinology

## 2014-07-31 VITALS — BP 136/86 | HR 76 | Temp 98.2°F | Ht 69.0 in | Wt 290.0 lb

## 2014-07-31 VITALS — BP 162/80 | HR 76 | Wt 290.0 lb

## 2014-07-31 DIAGNOSIS — E1165 Type 2 diabetes mellitus with hyperglycemia: Secondary | ICD-10-CM

## 2014-07-31 DIAGNOSIS — E669 Obesity, unspecified: Secondary | ICD-10-CM

## 2014-07-31 DIAGNOSIS — R42 Dizziness and giddiness: Secondary | ICD-10-CM

## 2014-07-31 DIAGNOSIS — I10 Essential (primary) hypertension: Secondary | ICD-10-CM | POA: Diagnosis not present

## 2014-07-31 DIAGNOSIS — IMO0002 Reserved for concepts with insufficient information to code with codable children: Secondary | ICD-10-CM

## 2014-07-31 DIAGNOSIS — R05 Cough: Secondary | ICD-10-CM

## 2014-07-31 DIAGNOSIS — R059 Cough, unspecified: Secondary | ICD-10-CM

## 2014-07-31 LAB — HEMOGLOBIN A1C: Hgb A1c MFr Bld: 9 % — ABNORMAL HIGH (ref 4.6–6.5)

## 2014-07-31 MED ORDER — PROMETHAZINE-CODEINE 6.25-10 MG/5ML PO SYRP: 5.0000 mL | ORAL_SOLUTION | ORAL | Status: DC | PRN

## 2014-07-31 MED ORDER — MECLIZINE HCL 12.5 MG PO TABS: 12.5000 mg | ORAL_TABLET | Freq: Three times a day (TID) | ORAL | Status: DC | PRN

## 2014-07-31 MED ORDER — AZITHROMYCIN 250 MG PO TABS: ORAL_TABLET | ORAL | Status: DC

## 2014-07-31 MED ORDER — TAMSULOSIN HCL 0.4 MG PO CAPS: 0.4000 mg | ORAL_CAPSULE | Freq: Every day | ORAL | Status: DC

## 2014-07-31 MED ORDER — INSULIN GLARGINE 100 UNIT/ML SOLOSTAR PEN: 130.0000 [IU] | PEN_INJECTOR | SUBCUTANEOUS | Status: DC

## 2014-07-31 NOTE — Progress Notes (Signed)
Subjective:    HPI  C/o dizziness when turning to the left x10 times  F/u abd pain - resolved - s/p Urol check up  C/o frequency - Rapaflo is too $$$  C/o cough   The patient presents for a follow-up of  chronic hypertension, chronic dyslipidemia, type 2 diabetes. Seeing Dr Loanne Drilling. F/u leg swelling.   Wt Readings from Last 3 Encounters:  07/31/14 290 lb (131.543 kg)  07/31/14 290 lb (131.543 kg)  06/17/14 285 lb (129.275 kg)   BP Readings from Last 3 Encounters:  07/31/14 162/80  07/31/14 136/86  06/17/14 160/80      Review of Systems  Constitutional: Positive for unexpected weight change. Negative for appetite change and fatigue.  HENT: Negative for congestion, nosebleeds, sneezing, sore throat and trouble swallowing.   Eyes: Positive for visual disturbance. Negative for itching.  Respiratory: Positive for cough.   Cardiovascular: Positive for leg swelling. Negative for chest pain and palpitations.  Gastrointestinal: Negative for nausea, diarrhea, blood in stool and abdominal distention.  Genitourinary: Positive for urgency and frequency. Negative for hematuria.  Musculoskeletal: Negative for back pain, joint swelling, gait problem and neck pain.  Skin: Negative for rash.  Neurological: Negative for dizziness, tremors, speech difficulty and weakness.  Psychiatric/Behavioral: Negative for suicidal ideas, sleep disturbance, dysphoric mood and agitation. The patient is not nervous/anxious.        Objective:   Physical Exam  Constitutional: He is oriented to person, place, and time. He appears well-developed. No distress.  NAD  HENT:  Mouth/Throat: Oropharynx is clear and moist.  Eyes: Conjunctivae are normal. Pupils are equal, round, and reactive to light.  Neck: Normal range of motion. No JVD present. No thyromegaly present.  Cardiovascular: Normal rate, regular rhythm, normal heart sounds and intact distal pulses.  Exam reveals no gallop and no friction rub.     No murmur heard. Pulmonary/Chest: Effort normal and breath sounds normal. No respiratory distress. He has no wheezes. He has no rales. He exhibits no tenderness.  Abdominal: Soft. Bowel sounds are normal. He exhibits no distension and no mass. There is no tenderness. There is no rebound and no guarding.  Musculoskeletal: Normal range of motion. He exhibits no edema or tenderness.  Lymphadenopathy:    He has no cervical adenopathy.  Neurological: He is alert and oriented to person, place, and time. He has normal reflexes. No cranial nerve deficit. He exhibits normal muscle tone. He displays a negative Romberg sign. Coordination and gait normal.  No meningeal signs  Skin: Skin is warm and dry. No rash noted.  Psychiatric: He has a normal mood and affect. His behavior is normal. Judgment and thought content normal.    Lab Results  Component Value Date   WBC 7.9 06/17/2014   HGB 14.8 06/17/2014   HCT 43.3 06/17/2014   PLT 320.0 06/17/2014   GLUCOSE 231* 06/17/2014   CHOL 217* 06/17/2014   TRIG 98.0 06/17/2014   HDL 57.30 06/17/2014   LDLCALC 140* 06/17/2014   ALT 14 06/17/2014   AST 14 06/17/2014   NA 135 06/17/2014   K 3.9 06/17/2014   CL 98 06/17/2014   CREATININE 1.04 06/17/2014   BUN 17 06/17/2014   CO2 29 06/17/2014   TSH 1.81 07/17/2012   PSA 0.50 07/17/2012   INR 1.08 02/02/2011   HGBA1C 11.6* 11/19/2013   MICROALBUR 138.8 Verified by manual dilution.* 01/30/2014    Abd CT renal stone study IMPRESSION: Nonobstructing 6 mm LEFT interpolar nephrolithiasis.  No  acute intra-abdominal or pelvic process.   Electronically Signed  By: Elon Alas  On: 06/14/2014 04:54        Assessment & Plan:  Patient ID: [name] [name], male   DOB: [birth year], 64 y.o.   MRN: [id number]

## 2014-07-31 NOTE — Assessment & Plan Note (Signed)
On Lantus

## 2014-07-31 NOTE — Patient Instructions (Addendum)
blood tests are being requested for you today.  We'll let you know about the results. check your blood sugar twice a day.  vary the time of day when you check, between before the 3 meals, and at bedtime.  also check if you have symptoms of your blood sugar being too high or too low.  please keep a record of the readings and bring it to your next appointment here.  You can write it on any piece of paper.  please call us sooner if your blood sugar goes below 70, or if you have a lot of readings over 200.   Even if you can only check once every other day, (varying the time of day), that helps a lot.   On this type of insulin schedule, you should eat meals on a regular schedule.  If a meal is missed or significantly delayed, your blood sugar could go low.   Please come back for a follow-up appointment in 2 months.

## 2014-07-31 NOTE — Assessment & Plan Note (Signed)
Chronic, refractory Lap band was declined by [name] [name] before - discussed again

## 2014-07-31 NOTE — Progress Notes (Signed)
Pre visit review using our clinic review tool, if applicable. No additional management support is needed unless otherwise documented below in the visit note. 

## 2014-07-31 NOTE — Patient Instructions (Signed)
Benign Positional Vertigo symptoms on the left. Start Meclizine. Start Brandt - Daroff exercise several times a day as dirrected. 

## 2014-07-31 NOTE — Assessment & Plan Note (Signed)
Benign Positional Vertigo symptoms on the left. Start Meclizine. Start Brandt - Daroff exercise several times a day as dirrected. 

## 2014-07-31 NOTE — Progress Notes (Signed)
Subjective:    Patient ID: [name] [name], male    DOB: Nov 17, 1950, 64 y.o.   MRN: [id number]  HPI Pt returns for f/u of diabetes mellitus: DM type: insulin-requiring type 2 Dx'ed: 2878 Complications: sensory neuropathy of the lower extremities, nephropathy, and CAD Therapy: insulin since 2001 DKA: never Severe hypoglycemia: never Pancreatitis: never Other: he changed to qd insulin, as he could not remember to take multiple daily injections; .   Interval history:  Pt doesn't check cbg's.  pt states he feels well in general, except for recent URI.   Past Medical History  Diagnosis Date  . OSA (obstructive sleep apnea)     cpap  . Hypertension   . Hyperlipidemia   . Diabetes mellitus, type 2   . Exogenous obesity   . Diverticulosis of colon   . Testicular hypofunction   . Erectile dysfunction   . Cervical disc disease   . Bursitis of left shoulder   . History of anemia     Past Surgical History  Procedure Laterality Date  . Posterior laminectomy / decompression cervical spine      c5-6 fusion 12/08 Dr. Consuello Masse  . S/p cervical disc surgery and fusion  10/09    Dr. Consuello Masse    History   Social History  . Marital Status: Married    Spouse Name: N/A  . Number of Children: 1  . Years of Education: N/A   Occupational History  . 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 Smoking status: Former Smoker    Quit date: 01/20/1981  . Smokeless tobacco: Never Used  . Alcohol Use: 3.9 oz/week    4 Glasses of wine, 3 Standard drinks or equivalent per week  . Drug Use: No  . Sexual Activity: Yes   Other Topics Concern  . Not on file   Social History Narrative    Current Outpatient Prescriptions on File Prior to Visit  Medication Sig Dispense Refill  . alfuzosin (UROXATRAL) 10 MG 24 hr tablet Take 1 tablet (10 mg total) by mouth daily. 90 tablet 3  . amLODipine (NORVASC) 10 MG tablet Take 1 tablet (10 mg total)  by mouth daily. 90 tablet 3  . aspirin 81 MG tablet Take 81 mg by mouth daily.      . Blood Glucose Monitoring Suppl (ONETOUCH VERIO [facility] SYSTEM) W/DEVICE KIT 1 Device by Does not apply route once. 1 kit 0  . cholecalciferol (VITAMIN D) 1000 UNITS tablet Take 1 tablet (1,000 Units total) by mouth daily. 100 tablet 3  . dicyclomine (BENTYL) 20 MG tablet Take 1 tablet (20 mg total) by mouth every 6 (six) hours as needed for spasms (for abdominal cramping). (Patient not taking: Reported on 07/31/2014) 20 tablet 0  . furosemide (LASIX) 40 MG tablet Take 1 tablet (40 mg total) by mouth daily. 90 tablet 3  . glucose blood (ONETOUCH VERIO) test strip 1 each by Other route 2 (two) times daily. And lancets 2/day 250.61 100 each 12  . Insulin Pen Needle 31G X 8 MM MISC Use once daily as directed 100 each 6  . ketorolac (TORADOL) 10 MG tablet Take 1 tablet (10 mg total) by mouth every 8 (eight) hours as needed for moderate pain or severe pain (renal colick). (Patient not taking: Reported on 07/31/2014) 20 tablet 0  . losartan (COZAAR) 100 MG tablet Take 1 tablet (100 mg total) by mouth daily. 90 tablet 3  .  metoprolol succinate (TOPROL XL) 25 MG 24 hr tablet Take 1 tablet (25 mg total) by mouth daily. 90 tablet 3  . rosuvastatin (CRESTOR) 20 MG tablet Take 1 tablet (20 mg total) by mouth daily. 90 tablet 3   No current facility-administered medications on file prior to visit.    Allergies  Allergen Reactions  . Amoxicillin Nausea And Vomiting  . Lisinopril     Cough   . Penicillins     Per pt: unknown  . Simvastatin     myalgia    Family History  Problem Relation Age of Onset  . Heart disease Father     Died of MI at age 31  . Breast cancer Mother   . Colon cancer Neg Hx   . Diabetes Paternal Grandmother     BP 136/86 mmHg  Pulse 76  Temp(Src) 98.2 F (36.8 C) (Oral)  Ht '5\' 9"'  (1.753 m)  Wt 290 lb (131.543 kg)  BMI 42.81 kg/m2  SpO2 95%    Review of Systems He denies hypoglycemia.   He has gained weight.    Objective:   Physical Exam VITAL SIGNS:  See vs page GENERAL: no distress EXTEMITIES: no deformity.   SKIN: no ulcer on the feet. feet are of normal color and temp.  PULSES: dorsalis pedis intact bilat.   CV: 2+ bilat leg edema NEURO: sensation is intact to touch on the feet, but decreased from normal.    Lab Results  Component Value Date   HGBA1C 9.0* 07/31/2014      Assessment & Plan:  Obesity: worse: he declines weight loss surgery and dietician. DM: moderate exacerbation Noncompliance with cbg recording: I'll work around this as best I can, which for now is to titate insulin to a1c  Patient is advised the following: Patient Instructions  blood tests are being requested for you today.  We'll let you know about the results. check your blood sugar twice a day.  vary the time of day when you check, between before the 3 meals, and at bedtime.  also check if you have symptoms of your blood sugar being too high or too low.  please keep a record of the readings and bring it to your next appointment here.  You can write it on any piece of paper.  please call us sooner if your blood sugar goes below 70, or if you have a lot of readings over 200.   Even if you can only check once every other day, (varying the time of day), that helps a lot.   On this type of insulin schedule, you should eat meals on a regular schedule.  If a meal is missed or significantly delayed, your blood sugar could go low.   Please come back for a follow-up appointment in 2 months.    addendum: increase the lantus to 160 units each morning

## 2014-07-31 NOTE — Assessment & Plan Note (Signed)
3/16 post-URI Prom-cod syr prn

## 2014-07-31 NOTE — Assessment & Plan Note (Signed)
BP Readings from Last 3 Encounters:  07/31/14 162/80  07/31/14 136/86  06/17/14 160/80

## 2014-08-24 ENCOUNTER — Emergency Department (HOSPITAL_BASED_OUTPATIENT_CLINIC_OR_DEPARTMENT_OTHER): Payer: Managed Care, Other (non HMO)

## 2014-08-24 ENCOUNTER — Encounter (HOSPITAL_BASED_OUTPATIENT_CLINIC_OR_DEPARTMENT_OTHER): Payer: Self-pay

## 2014-08-24 ENCOUNTER — Inpatient Hospital Stay (HOSPITAL_BASED_OUTPATIENT_CLINIC_OR_DEPARTMENT_OTHER)
Admission: EM | Admit: 2014-08-24 | Discharge: 2014-08-26 | DRG: 694 | Disposition: A | Discharge status: Home / Self Care | Payer: Managed Care, Other (non HMO) | Attending: Internal Medicine | Admitting: Internal Medicine

## 2014-08-24 DIAGNOSIS — E876 Hypokalemia: Secondary | ICD-10-CM | POA: Diagnosis present

## 2014-08-24 DIAGNOSIS — Z87891 Personal history of nicotine dependence: Secondary | ICD-10-CM

## 2014-08-24 DIAGNOSIS — G4733 Obstructive sleep apnea (adult) (pediatric): Secondary | ICD-10-CM | POA: Diagnosis present

## 2014-08-24 DIAGNOSIS — E1165 Type 2 diabetes mellitus with hyperglycemia: Secondary | ICD-10-CM | POA: Diagnosis present

## 2014-08-24 DIAGNOSIS — R109 Unspecified abdominal pain: Secondary | ICD-10-CM | POA: Diagnosis present

## 2014-08-24 DIAGNOSIS — E785 Hyperlipidemia, unspecified: Secondary | ICD-10-CM | POA: Diagnosis present

## 2014-08-24 DIAGNOSIS — I10 Essential (primary) hypertension: Secondary | ICD-10-CM | POA: Diagnosis present

## 2014-08-24 DIAGNOSIS — Z6841 Body Mass Index (BMI) 40.0 and over, adult: Secondary | ICD-10-CM | POA: Diagnosis not present

## 2014-08-24 DIAGNOSIS — Z794 Long term (current) use of insulin: Secondary | ICD-10-CM

## 2014-08-24 DIAGNOSIS — Z79899 Other long term (current) drug therapy: Secondary | ICD-10-CM

## 2014-08-24 DIAGNOSIS — N202 Calculus of kidney with calculus of ureter: Secondary | ICD-10-CM | POA: Diagnosis present

## 2014-08-24 DIAGNOSIS — Z888 Allergy status to other drugs, medicaments and biological substances status: Secondary | ICD-10-CM

## 2014-08-24 DIAGNOSIS — Z880 Allergy status to penicillin: Secondary | ICD-10-CM

## 2014-08-24 DIAGNOSIS — R1110 Vomiting, unspecified: Secondary | ICD-10-CM

## 2014-08-24 DIAGNOSIS — Z7982 Long term (current) use of aspirin: Secondary | ICD-10-CM | POA: Diagnosis not present

## 2014-08-24 DIAGNOSIS — N132 Hydronephrosis with renal and ureteral calculous obstruction: Principal | ICD-10-CM | POA: Diagnosis present

## 2014-08-24 DIAGNOSIS — R1011 Right upper quadrant pain: Secondary | ICD-10-CM

## 2014-08-24 DIAGNOSIS — E1169 Type 2 diabetes mellitus with other specified complication: Secondary | ICD-10-CM | POA: Diagnosis present

## 2014-08-24 DIAGNOSIS — K5730 Diverticulosis of large intestine without perforation or abscess without bleeding: Secondary | ICD-10-CM | POA: Diagnosis present

## 2014-08-24 DIAGNOSIS — Z981 Arthrodesis status: Secondary | ICD-10-CM

## 2014-08-24 DIAGNOSIS — N23 Unspecified renal colic: Secondary | ICD-10-CM

## 2014-08-24 DIAGNOSIS — N521 Erectile dysfunction due to diseases classified elsewhere: Secondary | ICD-10-CM | POA: Diagnosis present

## 2014-08-24 DIAGNOSIS — N201 Calculus of ureter: Secondary | ICD-10-CM

## 2014-08-24 LAB — CBC WITH DIFFERENTIAL/PLATELET
Basophils Absolute: 0 10*3/uL (ref 0.0–0.1)
Basophils Relative: 0 % (ref 0–1)
EOS ABS: 0.3 10*3/uL (ref 0.0–0.7)
EOS PCT: 4 % (ref 0–5)
HEMATOCRIT: 42.2 % (ref 39.0–52.0)
Hemoglobin: 14 g/dL (ref 13.0–17.0)
LYMPHS PCT: 24 % (ref 12–46)
Lymphs Abs: 1.8 10*3/uL (ref 0.7–4.0)
MCH: 26.3 pg (ref 26.0–34.0)
MCHC: 33.2 g/dL (ref 30.0–36.0)
MCV: 79.3 fL (ref 78.0–100.0)
MONO ABS: 0.5 10*3/uL (ref 0.1–1.0)
Monocytes Relative: 7 % (ref 3–12)
NEUTROS ABS: 5 10*3/uL (ref 1.7–7.7)
NEUTROS PCT: 65 % (ref 43–77)
Platelets: 269 10*3/uL (ref 150–400)
RBC: 5.32 MIL/uL (ref 4.22–5.81)
RDW: 14.8 % (ref 11.5–15.5)
WBC: 7.7 10*3/uL (ref 4.0–10.5)

## 2014-08-24 LAB — URINALYSIS, ROUTINE W REFLEX MICROSCOPIC
Bilirubin Urine: NEGATIVE
Bilirubin Urine: NEGATIVE
KETONES UR: NEGATIVE mg/dL
Ketones, ur: NEGATIVE mg/dL
Leukocytes, UA: NEGATIVE
Leukocytes, UA: NEGATIVE
NITRITE: NEGATIVE
Nitrite: NEGATIVE
Protein, ur: >300 mg/dL — AB
Protein, ur: >300 mg/dL — AB
Specific Gravity, Urine: 1.024 (ref 1.005–1.030)
Specific Gravity, Urine: 1.025 (ref 1.005–1.030)
Urobilinogen, UA: 0.2 mg/dL (ref 0.0–1.0)
Urobilinogen, UA: 0.2 mg/dL (ref 0.0–1.0)
pH: 6 (ref 5.0–8.0)
pH: 5.5 (ref 5.0–8.0)

## 2014-08-24 LAB — BASIC METABOLIC PANEL
Anion gap: 8 (ref 5–15)
BUN: 20 mg/dL (ref 6–23)
CALCIUM: 8.9 mg/dL (ref 8.4–10.5)
CO2: 26 mmol/L (ref 19–32)
CREATININE: 1.06 mg/dL (ref 0.50–1.35)
Chloride: 104 mmol/L (ref 96–112)
GFR, EST AFRICAN AMERICAN: 84 mL/min — AB (ref >90)
GFR, EST NON AFRICAN AMERICAN: 72 mL/min — AB (ref >90)
Glucose, Bld: 255 mg/dL — ABNORMAL HIGH (ref 70–99)
Potassium: 3.4 mmol/L — ABNORMAL LOW (ref 3.5–5.1)
Sodium: 138 mmol/L (ref 135–145)

## 2014-08-24 LAB — GLUCOSE, CAPILLARY
GLUCOSE-CAPILLARY: 206 mg/dL — AB (ref 70–99)
GLUCOSE-CAPILLARY: 293 mg/dL — AB (ref 70–99)

## 2014-08-24 LAB — URINE MICROSCOPIC-ADD ON

## 2014-08-24 MED ORDER — ASPIRIN EC 81 MG PO TBEC
81.0000 mg | DELAYED_RELEASE_TABLET | Freq: Every day | ORAL | Status: DC
  Administered 2014-08-24 – 2014-08-26 (×3): 81 mg via ORAL
  Filled 2014-08-24 (×3): qty 1

## 2014-08-24 MED ORDER — HYDROMORPHONE HCL 1 MG/ML IJ SOLN
1.0000 mg | Freq: Once | INTRAMUSCULAR | Status: AC
  Administered 2014-08-24: 1 mg via INTRAVENOUS
  Filled 2014-08-24: qty 1

## 2014-08-24 MED ORDER — ROSUVASTATIN CALCIUM 20 MG PO TABS
20.0000 mg | ORAL_TABLET | Freq: Every day | ORAL | Status: DC
  Administered 2014-08-24 – 2014-08-25 (×2): 20 mg via ORAL
  Filled 2014-08-24 (×3): qty 1

## 2014-08-24 MED ORDER — POTASSIUM CHLORIDE CRYS ER 20 MEQ PO TBCR
40.0000 meq | EXTENDED_RELEASE_TABLET | Freq: Once | ORAL | Status: AC
  Administered 2014-08-24: 40 meq via ORAL
  Filled 2014-08-24: qty 2

## 2014-08-24 MED ORDER — ALFUZOSIN HCL ER 10 MG PO TB24
10.0000 mg | ORAL_TABLET | Freq: Every day | ORAL | Status: DC
  Administered 2014-08-24 – 2014-08-26 (×3): 10 mg via ORAL
  Filled 2014-08-24 (×3): qty 1

## 2014-08-24 MED ORDER — PROMETHAZINE HCL 25 MG/ML IJ SOLN
12.5000 mg | Freq: Once | INTRAMUSCULAR | Status: AC
  Administered 2014-08-24: 12.5 mg via INTRAVENOUS

## 2014-08-24 MED ORDER — PROMETHAZINE HCL 25 MG/ML IJ SOLN
25.0000 mg | Freq: Once | INTRAMUSCULAR | Status: DC
  Filled 2014-08-24: qty 1

## 2014-08-24 MED ORDER — ONDANSETRON HCL 4 MG PO TABS
4.0000 mg | ORAL_TABLET | Freq: Four times a day (QID) | ORAL | Status: DC | PRN
  Administered 2014-08-24 – 2014-08-26 (×3): 4 mg via ORAL
  Filled 2014-08-24 (×3): qty 1

## 2014-08-24 MED ORDER — AMLODIPINE BESYLATE 10 MG PO TABS
10.0000 mg | ORAL_TABLET | Freq: Every day | ORAL | Status: DC
  Administered 2014-08-24 – 2014-08-26 (×3): 10 mg via ORAL
  Filled 2014-08-24 (×3): qty 1

## 2014-08-24 MED ORDER — INSULIN GLARGINE 100 UNIT/ML ~~LOC~~ SOLN
60.0000 [IU] | Freq: Every day | SUBCUTANEOUS | Status: DC
  Administered 2014-08-25: 60 [IU] via SUBCUTANEOUS
  Filled 2014-08-24: qty 0.6

## 2014-08-24 MED ORDER — ENOXAPARIN SODIUM 40 MG/0.4ML ~~LOC~~ SOLN: 40.0000 mg | SUBCUTANEOUS | Status: DC

## 2014-08-24 MED ORDER — HYDROMORPHONE HCL 1 MG/ML IJ SOLN: 1.0000 mg | INTRAMUSCULAR | Status: DC | PRN

## 2014-08-24 MED ORDER — DICYCLOMINE HCL 20 MG PO TABS
20.0000 mg | ORAL_TABLET | Freq: Four times a day (QID) | ORAL | Status: DC | PRN
  Filled 2014-08-24: qty 1

## 2014-08-24 MED ORDER — INSULIN ASPART 100 UNIT/ML ~~LOC~~ SOLN
0.0000 [IU] | Freq: Three times a day (TID) | SUBCUTANEOUS | Status: DC
  Administered 2014-08-24 – 2014-08-25 (×3): 3 [IU] via SUBCUTANEOUS
  Administered 2014-08-25: 5 [IU] via SUBCUTANEOUS
  Administered 2014-08-26 (×2): 3 [IU] via SUBCUTANEOUS

## 2014-08-24 MED ORDER — ONDANSETRON 4 MG PO TBDP
4.0000 mg | ORAL_TABLET | Freq: Once | ORAL | Status: AC
  Administered 2014-08-24: 4 mg via ORAL
  Filled 2014-08-24: qty 1

## 2014-08-24 MED ORDER — ONDANSETRON HCL 4 MG/2ML IJ SOLN
4.0000 mg | Freq: Four times a day (QID) | INTRAMUSCULAR | Status: DC | PRN
  Administered 2014-08-24 – 2014-08-26 (×3): 4 mg via INTRAVENOUS
  Filled 2014-08-24 (×3): qty 2

## 2014-08-24 MED ORDER — OXYCODONE-ACETAMINOPHEN 5-325 MG PO TABS
2.0000 | ORAL_TABLET | Freq: Once | ORAL | Status: AC
Start: 2014-08-24 — End: 2014-08-24
  Administered 2014-08-24: 2 via ORAL
  Filled 2014-08-24: qty 2

## 2014-08-24 MED ORDER — TAMSULOSIN HCL 0.4 MG PO CAPS
0.4000 mg | ORAL_CAPSULE | Freq: Once | ORAL | Status: AC
  Administered 2014-08-24: 0.4 mg via ORAL
  Filled 2014-08-24: qty 1

## 2014-08-24 MED ORDER — ONDANSETRON HCL 4 MG/2ML IJ SOLN
INTRAMUSCULAR | Status: AC
  Administered 2014-08-24: 4 mg
  Filled 2014-08-24: qty 2

## 2014-08-24 MED ORDER — KETOROLAC TROMETHAMINE 30 MG/ML IJ SOLN
30.0000 mg | Freq: Once | INTRAMUSCULAR | Status: AC
  Administered 2014-08-24: 30 mg via INTRAVENOUS

## 2014-08-24 MED ORDER — ENOXAPARIN SODIUM 80 MG/0.8ML ~~LOC~~ SOLN
70.0000 mg | SUBCUTANEOUS | Status: DC
  Administered 2014-08-24 – 2014-08-25 (×2): 70 mg via SUBCUTANEOUS
  Filled 2014-08-24 (×2): qty 0.8

## 2014-08-24 MED ORDER — PROMETHAZINE HCL 25 MG/ML IJ SOLN
12.5000 mg | Freq: Four times a day (QID) | INTRAMUSCULAR | Status: DC | PRN
  Administered 2014-08-26: 12.5 mg via INTRAVENOUS
  Filled 2014-08-24: qty 1

## 2014-08-24 MED ORDER — LOSARTAN POTASSIUM 50 MG PO TABS
100.0000 mg | ORAL_TABLET | Freq: Every day | ORAL | Status: DC
  Administered 2014-08-24 – 2014-08-26 (×3): 100 mg via ORAL
  Filled 2014-08-24 (×3): qty 2

## 2014-08-24 MED ORDER — SODIUM CHLORIDE 0.9 % IV SOLN
INTRAVENOUS | Status: DC
  Administered 2014-08-24 – 2014-08-26 (×4): via INTRAVENOUS

## 2014-08-24 MED ORDER — INSULIN GLARGINE 100 UNIT/ML SOLOSTAR PEN: 60.0000 [IU] | PEN_INJECTOR | SUBCUTANEOUS | Status: DC

## 2014-08-24 MED ORDER — METOPROLOL SUCCINATE ER 25 MG PO TB24
25.0000 mg | ORAL_TABLET | Freq: Every day | ORAL | Status: DC
  Administered 2014-08-24 – 2014-08-26 (×3): 25 mg via ORAL
  Filled 2014-08-24 (×3): qty 1

## 2014-08-24 MED ORDER — TAMSULOSIN HCL 0.4 MG PO CAPS
0.4000 mg | ORAL_CAPSULE | Freq: Every day | ORAL | Status: DC
  Administered 2014-08-25 – 2014-08-26 (×2): 0.4 mg via ORAL
  Filled 2014-08-24 (×2): qty 1

## 2014-08-24 MED ORDER — HYDROCODONE-ACETAMINOPHEN 5-325 MG PO TABS
1.0000 | ORAL_TABLET | ORAL | Status: DC | PRN
  Administered 2014-08-24 – 2014-08-26 (×6): 2 via ORAL
  Filled 2014-08-24 (×6): qty 2

## 2014-08-24 MED ORDER — KETOROLAC TROMETHAMINE 30 MG/ML IJ SOLN
INTRAMUSCULAR | Status: AC
  Filled 2014-08-24: qty 1

## 2014-08-24 MED ORDER — FENTANYL CITRATE (PF) 100 MCG/2ML IJ SOLN
INTRAMUSCULAR | Status: AC
  Administered 2014-08-24: 50 ug
  Filled 2014-08-24: qty 2

## 2014-08-24 MED ORDER — HYDROMORPHONE HCL 1 MG/ML IJ SOLN
1.0000 mg | INTRAMUSCULAR | Status: DC | PRN
  Administered 2014-08-24 (×2): 1 mg via INTRAVENOUS
  Filled 2014-08-24 (×2): qty 1

## 2014-08-24 MED ORDER — ONDANSETRON HCL 4 MG/2ML IJ SOLN
4.0000 mg | Freq: Once | INTRAMUSCULAR | Status: AC
  Administered 2014-08-24: 4 mg via INTRAVENOUS
  Filled 2014-08-24: qty 2

## 2014-08-24 NOTE — Progress Notes (Signed)
CARE MANAGEMENT NOTE 08/24/2014  Patient:  [name],[name]   Account Number:  [id number]  Date Initiated:  08/24/2014  Documentation initiated by:  DAVIS,RHONDA  Subjective/Objective Assessment:   pt with confirmed uteral stone, with hydronephrosis.  Pain control not met with iv meds.     Action/Plan:   home when stable and obstruction cleared   Anticipated DC Date:  08/27/2014   Anticipated DC Plan:  HOME/SELF CARE  In-house referral  NA      DC Planning Services  CM consult      Choice offered to / List presented to:             Status of service:  In process, will continue to follow Medicare Important Message given?   (If response is "NO", the following Medicare IM given date fields will be blank) Date Medicare IM given:   Medicare IM given by:   Date Additional Medicare IM given:   Additional Medicare IM given by:    Discharge Disposition:    Per UR Regulation:  Reviewed for med. necessity/level of care/duration of stay  If discussed at [place] of Stay Meetings, dates discussed:    Comments:  August 24, 2014/Rhonda L. Rosana Hoes, RN, BSN, CCM. Case Management [place] [phone] .

## 2014-08-24 NOTE — Discharge Instructions (Signed)
We saw you in the ER for the abdominal pain. Our results indicate that you have a kidney stone, left side, just by the bladder. We were able to get your pain is relative control, and we can safely send you home.  Take the meds prescribed. Set up an appointment with the Urologist. If the pain is unbearable, you start having fevers, chills, and are unable to keep any meds down - then return to the ER.  Ureteral Colic (Kidney Stones) Ureteral colic is the result of a condition when kidney stones form inside the kidney. Once kidney stones are formed they may move into the tube that connects the kidney with the bladder (ureter). If this occurs, this condition may cause pain (colic) in the ureter.  CAUSES  Pain is caused by stone movement in the ureter and the obstruction caused by the stone. SYMPTOMS  The pain comes and goes as the ureter contracts around the stone. The pain is usually intense, sharp, and stabbing in character. The location of the pain may move as the stone moves through the ureter. When the stone is near the kidney the pain is usually located in the back and radiates to the belly (abdomen). When the stone is ready to pass into the bladder the pain is often located in the lower abdomen on the side the stone is located. At this location, the symptoms may mimic those of a urinary tract infection with urinary frequency. Once the stone is located here it often passes into the bladder and the pain disappears completely. TREATMENT   Your caregiver will provide you with medicine for pain relief.  You may require specialized follow-up X-rays.  The absence of pain does not always mean that the stone has passed. It may have just stopped moving. If the urine remains completely obstructed, it can cause loss of kidney function or even complete destruction of the involved kidney. It is your responsibility and in your interest that X-rays and follow-ups as suggested by your caregiver are completed.  Relief of pain without passage of the stone can be associated with severe damage to the kidney, including loss of kidney function on that side.  If your stone does not pass on its own, additional measures may be taken by your caregiver to ensure its removal. HOME CARE INSTRUCTIONS   Increase your fluid intake. Water is the preferred fluid since juices containing vitamin C may acidify the urine making it less likely for certain stones (uric acid stones) to pass.  Strain all urine. A strainer will be provided. Keep all particulate matter or stones for your caregiver to inspect.  Take your pain medicine as directed.  Make a follow-up appointment with your caregiver as directed.  Remember that the goal is passage of your stone. The absence of pain does not mean the stone is gone. Follow your caregiver's instructions.  Only take over-the-counter or prescription medicines for pain, discomfort, or fever as directed by your caregiver. SEEK MEDICAL CARE IF:   Pain cannot be controlled with the prescribed medicine.  You have a fever.  Pain continues for longer than your caregiver advises it should.  There is a change in the pain, and you develop chest discomfort or constant abdominal pain.  You feel faint or pass out. MAKE SURE YOU:   Understand these instructions.  Will watch your condition.  Will get help right away if you are not doing well or get worse. Document Released: 02/03/2005 Document Revised: 08/21/2012 Document Reviewed: 10/21/2010 ExitCare  Patient Information 2015 [place]. This information is not intended to replace advice given to you by your health care provider. Make sure you discuss any questions you have with your health care provider.

## 2014-08-24 NOTE — H&P (Signed)
[facility] History and Physical  [name] [name] VQQ:[id number] DOB: [birth year] DOA: 08/24/2014  Referring physician: ED physician PCP: Walker Kehr, MD   Chief Complaint: Left flank pain  HPI:  Patient is 64 year old male with hypertension and hyperlipidemia, presented to [facility] with main concern of sudden onset left-sided flank pain that started several hours prior to this admission, sharp and intermittent in nature, 10/10 in severity, occasionally but not consistently radiating to anterior left sided abdominal area, worse with movement and deep breaths, no specific alleviating factors, associated with nausea and nonbloody vomiting, poor oral intake. Patient denies similar episodes in the past. Patient also denies fevers and chills, no other abdominal concerns such as diarrhea or constipation, no specific urinary concerns such as dysuria, urinary urgency or frequency. Patient does report noticing some hematuria. Patient denies chest pain or shortness of breath.  In emergency department, patient noted to be hemodynamically stable, vital signs stable, CT scan notable for 3 mm obstructing stone at the right UVJ with associated mild right hydronephrosis, 8 mm nonobstructing left renal calculus. Urology consulted by emergency room doctor, [place] asked to admit for further evaluation.   Assessment and Plan: Active Problems: Right UVJ stone, left renal calculus - Patient admitted to medical bed for further management - Place on IV fluids, provide analgesia and antiemetics as needed - Follow-up on urologist recommendations Mild right hydroureteronephrosis - Secondary to the above, right UVJ stone - IV fluids and analgesia as needed - Recommendations per urologist Hypokalemia - supplement and repeat BMP in AM Hypertension - Continue home medical regimen with losartan, metoprolol, Norvasc - Hold Lasix for now while patient receiving IV fluids Hyperlipidemia - Continue  statin Diabetes mellitus type 2 with complications of ED - Place on sliding scale insulin for now, check A1c - continue Lantus at 50% home dose as pt is NPO  DVT prophylaxis - Lovenox SQ Morbid obesity   - Body mass index is 46.53 kg/(m^2).   Radiological Exams on Admission: Ct Renal Stone Study  08/24/2014   1. 3 mm obstructive stone at the right UVJ with secondary very mild right hydroureteronephrosis and mild right periureteral fat stranding. 2. 8 mm nonobstructive left renal calculus. 3. Very mild sigmoid diverticulosis without evidence for acute diverticulitis.   Code Status: Full Family Communication: Pt at bedside Disposition Plan: Admit for further evaluation    [facility] [phone]   Review of Systems:  Constitutional: Negative for fever, chills. Negative for diaphoresis.  HENT: Negative for hearing loss, ear pain, nosebleeds, congestion, sore throat, neck pain, tinnitus and ear discharge.   Eyes: Negative for blurred vision, double vision, photophobia, pain, discharge and redness.  Respiratory: Negative for cough, hemoptysis, sputum production, shortness of breath, wheezing and stridor.   Cardiovascular: Negative for chest pain, palpitations, orthopnea, claudication and leg swelling.  Gastrointestinal:  Negative for heartburn, constipation, blood in stool and melena.  Genitourinary: Negative for dysuria, urgency, frequency.  Musculoskeletal: Negative for myalgias, back pain, joint pain and falls.  Skin: Negative for itching and rash.  Neurological: Negative for dizziness and weakness.  Endo/Heme/Allergies: Negative for environmental allergies and polydipsia. Does not bruise/bleed easily.  Psychiatric/Behavioral: Negative for suicidal ideas. The patient is not nervous/anxious.      Past Medical History  Diagnosis Date  . OSA (obstructive sleep apnea)     cpap  . Hypertension   . Hyperlipidemia   . Diabetes mellitus, type 2   . Exogenous obesity   .  Diverticulosis of colon   .  Testicular hypofunction   . Erectile dysfunction   . Cervical disc disease   . Bursitis of left shoulder   . History of anemia     Past Surgical History  Procedure Laterality Date  . Posterior laminectomy / decompression cervical spine      c5-6 fusion 12/08 Dr. Consuello Masse  . S/p cervical disc surgery and fusion  10/09    Dr. Consuello Masse    Social History:  reports that he quit smoking about 33 years ago. He has never used smokeless tobacco. He reports that he drinks about 3.9 oz of alcohol per week. He reports that he does not use illicit drugs.  Allergies  Allergen Reactions  . Amoxicillin Nausea And Vomiting  . Lisinopril     Cough   . Penicillins     Per pt: unknown  . Simvastatin     myalgia    Family History  Problem Relation Age of Onset  . Heart disease Father     Died of MI at age 51  . Breast cancer Mother   . Colon cancer Neg Hx   . Diabetes Paternal Grandmother     Medication Sig  alfuzosin 10 MG 24 hr tablet Take 1 tablet (10 mg total) by mouth daily.  amLODipine (NORVASC) 10 MG tablet Take 1 tablet (10 mg total) by mouth daily.  aspirin 81 MG tablet Take 81 mg by mouth daily.    furosemide (LASIX) 40 MG tablet Take 1 tablet (40 mg total) by mouth daily.  Insulin Glargine (LANTUS) 100 UNIT/ML Solostar Pen Inject 130 Units into the skin every morning. Patient taking differently: Inject 160 Units into the skin every morning.   ketorolac (TORADOL) 10 MG tablet Take 1 tablet (10 mg total) by mouth every 8 (eight) hours as needed for moderate pain or severe pain (renal colick). Patient not taking: Reported on 07/31/2014  losartan (COZAAR) 100 MG tablet Take 1 tablet (100 mg total) by mouth daily.  metoprolol succinate (TOPROL XL) 25 MG 24 hr tablet Take 1 tablet (25 mg total) by mouth daily.  rosuvastatin (CRESTOR) 20 MG tablet Take 1 tablet (20 mg total) by mouth daily.  tamsulosin (FLOMAX) 0.4 MG CAPS capsule Take 1 capsule (0.4 mg total)  by mouth daily.    Physical Exam: Filed Vitals:   08/24/14 0444 08/24/14 0730 08/24/14 0954 08/24/14 1047  BP: 182/85 176/91 191/83 154/54  Pulse: 78 86 94 91  Temp: 98 F (36.7 C)  98.1 F (36.7 C) 98.2 F (36.8 C)  TempSrc: Oral  Oral Oral  Resp: 22 20 18 18   Height:    5\' 7"  (1.702 m)  Weight:    134.8 kg (297 lb 2.9 oz)  SpO2: 99% 98% 97% 94%    Physical Exam  Constitutional: Appears well-developed and well-nourished. No distress.  HENT: Normocephalic. External right and left ear normal. Oropharynx is clear and moist.  Eyes: Conjunctivae and EOM are normal. PERRLA, no scleral icterus.  Neck: Normal ROM. Neck supple. No JVD. No tracheal deviation. No thyromegaly.  CVS: RRR, S1/S2 +, no murmurs, no gallops, no carotid bruit.  Pulmonary: Effort and breath sounds normal, no stridor, rhonchi, wheezes, rales.  Abdominal: Soft. BS +,  no distension, tenderness, rebound or guarding. Bilateral flank area TTP Musculoskeletal: Normal range of motion. No edema and no tenderness.  Lymphadenopathy: No lymphadenopathy noted, cervical, inguinal. Neuro: Alert. Normal reflexes, muscle tone coordination. No cranial nerve deficit. Skin: Skin is warm and dry. No rash noted. Not diaphoretic. No  erythema. No pallor.  Psychiatric: Normal mood and affect. Behavior, judgment, thought content normal.   Labs on Admission:  Basic Metabolic Panel:  Recent Labs Lab 08/24/14 0455  NA 138  K 3.4*  CL 104  CO2 26  GLUCOSE 255*  BUN 20  CREATININE 1.06  CALCIUM 8.9   CBC:  Recent Labs Lab 08/24/14 0455  WBC 7.7  NEUTROABS 5.0  HGB 14.0  HCT 42.2  MCV 79.3  PLT 269   EKG: pending   If 7PM-7AM, please contact night-coverage [email or web address] Password [facility] 08/24/2014, 11:16 AM

## 2014-08-24 NOTE — ED Provider Notes (Signed)
CSN: [id number]     Arrival date & time 08/24/14  0433 History   First MD Initiated Contact with Patient 08/24/14 0457     Chief Complaint  Patient presents with  . Flank Pain     (Consider location/radiation/quality/duration/timing/severity/associated sxs/prior Treatment) HPI Comments: Pt comes in with cc of flank pain. Pt has kidney stones, with similar episode 2 months ago. Pt woke up at 3:15 am, with severe pain. Pain is L sided, sharp, non radiating. Pain similar to his renal stone pain on the left side, but even more severe. Pt has nausea and emesis. No chest pain.  ROS 10 Systems reviewed and are negative for acute change except as noted in the HPI.     Patient is a 64 y.o. male presenting with flank pain. The history is provided by the patient.  Flank Pain    Past Medical History  Diagnosis Date  . OSA (obstructive sleep apnea)     cpap  . Hypertension   . Hyperlipidemia   . Diabetes mellitus, type 2   . Exogenous obesity   . Diverticulosis of colon   . Testicular hypofunction   . Erectile dysfunction   . Cervical disc disease   . Bursitis of left shoulder   . History of anemia    Past Surgical History  Procedure Laterality Date  . Posterior laminectomy / decompression cervical spine      c5-6 fusion 12/08 Dr. Consuello Masse  . S/p cervical disc surgery and fusion  10/09    Dr. Consuello Masse   Family History  Problem Relation Age of Onset  . Heart disease Father     Died of MI at age 93  . Breast cancer Mother   . Colon cancer Neg Hx   . Diabetes Paternal Grandmother    History  Substance Use Topics  . Smoking status: Former Smoker    Quit date: 01/20/1981  . Smokeless tobacco: Never Used  . Alcohol Use: 3.9 oz/week    4 Glasses of wine, 3 Standard drinks or equivalent per week    Review of Systems  Constitutional: Positive for diaphoresis.  Gastrointestinal: Positive for nausea and vomiting.  Genitourinary: Positive for flank pain.  All other systems reviewed  and are negative.     Allergies  Amoxicillin; Lisinopril; Penicillins; and Simvastatin  Home Medications   Prior to Admission medications   Medication Sig Start Date End Date Taking? Authorizing Provider  alfuzosin (UROXATRAL) 10 MG 24 hr tablet Take 1 tablet (10 mg total) by mouth daily. 06/17/14  Yes Aleksei Plotnikov V, MD  amLODipine (NORVASC) 10 MG tablet Take 1 tablet (10 mg total) by mouth daily. 08/15/13  Yes Aleksei Plotnikov V, MD  aspirin 81 MG tablet Take 81 mg by mouth daily.     Yes Historical Provider, MD  dicyclomine (BENTYL) 20 MG tablet Take 1 tablet (20 mg total) by mouth every 6 (six) hours as needed for spasms (for abdominal cramping). 06/14/14  Yes Linton Flemings, MD  furosemide (LASIX) 40 MG tablet Take 1 tablet (40 mg total) by mouth daily. 08/15/13  Yes Aleksei Plotnikov V, MD  Insulin Glargine (LANTUS) 100 UNIT/ML Solostar Pen Inject 130 Units into the skin every morning. Patient taking differently: Inject 140 Units into the skin every morning.  07/31/14  Yes Renato Shin, MD  losartan (COZAAR) 100 MG tablet Take 1 tablet (100 mg total) by mouth daily. 08/15/13  Yes Aleksei Plotnikov V, MD  metoprolol succinate (TOPROL XL) 25 MG 24 hr tablet  Take 1 tablet (25 mg total) by mouth daily. 08/15/13 08/28/14 Yes Aleksei Plotnikov V, MD  promethazine-codeine (PHENERGAN WITH CODEINE) 6.25-10 MG/5ML syrup Take 5 mLs by mouth every 4 (four) hours as needed. 07/31/14  Yes Aleksei Plotnikov V, MD  rosuvastatin (CRESTOR) 20 MG tablet Take 1 tablet (20 mg total) by mouth daily. 08/15/13  Yes Aleksei Plotnikov V, MD  tamsulosin (FLOMAX) 0.4 MG CAPS capsule Take 1 capsule (0.4 mg total) by mouth daily. 07/31/14  Yes Aleksei Plotnikov V, MD  ketorolac (TORADOL) 10 MG tablet Take 1 tablet (10 mg total) by mouth every 8 (eight) hours as needed for moderate pain or severe pain (renal colick). Patient not taking: Reported on 07/31/2014 06/17/14   Cassandria Anger, MD  meclizine (ANTIVERT) 12.5 MG tablet  Take 1 tablet (12.5 mg total) by mouth 3 (three) times daily as needed for dizziness. Patient not taking: Reported on 08/24/2014 07/31/14   Aleksei Plotnikov V, MD   BP 174/72 mmHg  Pulse 78  Temp(Src) 98.1 F (36.7 C) (Oral)  Resp 18  Ht 5\' 7"  (1.702 m)  Wt 297 lb 2.9 oz (134.8 kg)  BMI 46.53 kg/m2  SpO2 96% Physical Exam  Constitutional: He is oriented to person, place, and time. He appears well-developed.  HENT:  Head: Atraumatic.  Neck: Neck supple.  Cardiovascular: Normal rate and regular rhythm.   Pulmonary/Chest: Effort normal. No respiratory distress.  Abdominal: Soft. He exhibits distension. There is tenderness. There is guarding.  L sided pain - upper quadrant, mid quadrant and flank region  Neurological: He is oriented to person, place, and time.  Skin: Skin is warm. He is not diaphoretic.  Nursing note and vitals reviewed.   ED Course  Procedures (including critical care time) Labs Review Labs Reviewed  URINALYSIS, ROUTINE W REFLEX MICROSCOPIC - Abnormal; Notable for the following:    Glucose, UA >1000 (*)    Hgb urine dipstick TRACE (*)    Protein, ur >300 (*)    All other components within normal limits  BASIC METABOLIC PANEL - Abnormal; Notable for the following:    Potassium 3.4 (*)    Glucose, Bld 255 (*)    GFR calc non Af Amer 72 (*)    GFR calc Af Amer 84 (*)    All other components within normal limits  URINE MICROSCOPIC-ADD ON - Abnormal; Notable for the following:    Casts HYALINE CASTS (*)    All other components within normal limits  URINALYSIS, ROUTINE W REFLEX MICROSCOPIC - Abnormal; Notable for the following:    Glucose, UA >1000 (*)    Hgb urine dipstick SMALL (*)    Protein, ur >300 (*)    All other components within normal limits  BASIC METABOLIC PANEL - Abnormal; Notable for the following:    Glucose, Bld 269 (*)    BUN 28 (*)    Calcium 8.1 (*)    GFR calc non Af Amer 55 (*)    GFR calc Af Amer 64 (*)    All other components within  normal limits  CBC - Abnormal; Notable for the following:    Hemoglobin 11.7 (*)    HCT 37.0 (*)    All other components within normal limits  GLUCOSE, CAPILLARY - Abnormal; Notable for the following:    Glucose-Capillary 206 (*)    All other components within normal limits  GLUCOSE, CAPILLARY - Abnormal; Notable for the following:    Glucose-Capillary 293 (*)    All other components  within normal limits  URINE CULTURE  CBC WITH DIFFERENTIAL/PLATELET  URINE MICROSCOPIC-ADD ON  HEMOGLOBIN A1C    Imaging Review Ct Renal Stone Study  08/24/2014   CLINICAL DATA:  Initial evaluation for acute onset severe right flank pain.  EXAM: CT ABDOMEN AND PELVIS WITHOUT CONTRAST  TECHNIQUE: Multidetector CT imaging of the abdomen and pelvis was performed following the standard protocol without IV contrast.  COMPARISON:  Prior study from 06/14/2014.  FINDINGS: Subsegmental atelectasis seen dependently within the visualized lung bases. Visualized lungs are otherwise clear.  The liver demonstrates a normal unenhanced appearance. Punctate calcification noted within the left hepatic lobe. Gallbladder within normal limits. No biliary dilatation. Spleen, adrenal glands, and pancreas are within normal limits.  There is a in 8 mm nonobstructive stone within the upper pole left kidney. Additional punctate nonobstructive calculus present within the lower pole left kidney. No obstructive stone seen along the course of the left renal collecting system. There is no left-sided hydronephrosis or hydroureter.  On the right, no radiopaque calculi identified within the right kidney. There is no did area mild right-sided hydronephrosis and hydroureter. Mild right periureteral fat stranding present. There is a very subtle punctate 3 mm stone at the right UVJ (series 510, image 95). No other radiopaque calculi seen along the course of the right renal collecting system.  Small hiatal hernia noted. Stomach otherwise unremarkable. No  evidence for bowel obstruction. No acute inflammatory changes seen about the bowels. Minimal sigmoid diverticulosis present without evidence for diverticulitis. Appendix is normal.  Small fat containing paraumbilical hernia noted.  Bladder within normal limits.  Prostate unremarkable.  No free air or fluid. No adenopathy. Mild scattered atheromatous plaque present about the intra-abdominal aorta. No aneurysm.  No acute osseous abnormality. No worrisome lytic or blastic osseous lesions.  IMPRESSION: 1. 3 mm obstructive stone at the right UVJ with secondary very mild right hydroureteronephrosis and mild right periureteral fat stranding. 2. 8 mm nonobstructive left renal calculus. 3. Very mild sigmoid diverticulosis without evidence for acute diverticulitis. 4. Small hiatal hernia. 5. Small fat containing paraumbilical hernia.   Electronically Signed   By: Jeannine Boga M.D.   On: 08/24/2014 06:06     EKG Interpretation None     @6 :30 - CT results discussed. Pt is s/p Toradol, fentanyl. Pt gets transient relief only with the pain meds. Will try  Dilaudid. If pain not better, might need admission for pain control.  MDM   Final diagnoses:  Acute right flank pain  Ureteral colic  Intractable vomiting with nausea, vomiting of unspecified type    Pt comes in with cc of pain. Pt has L sided kidney stone in the past, now has right sided stone. Pt is s/p toradol, dilaudid, fentanyl - pain is not well controlled, and so he will be signed out to Dr. Tawnya Crook, who will try to control his pain. No emergent consult to urology needed.  Varney Biles, MD 08/25/14 [phone]

## 2014-08-24 NOTE — ED Provider Notes (Signed)
Pt reports pain poorly controlled after IV dilaudid, PO percocet, IV toradol. Discussed expected course of care with PO pain meds, antiemetics, flomax, pt does not feel comfortable going home. Will speak w/ on call Urology.   I spoke w/ Dr. Corbin Ade, who states they will follow but will likely not require intervention given size/location of stone w/ nml urine and creatinine. Dr. Doyle Askew with [facility] will [facility] to [facility].   1. Ureteral colic   2. Acute right flank pain   3. Intractable vomiting with nausea, vomiting of unspecified type      Ernestina Patches, MD 08/24/14 [phone]

## 2014-08-24 NOTE — ED Notes (Signed)
Pt states woke up at 0315 with severe rt flank pain/nausea/vomiting; states has a known kidney stone

## 2014-08-24 NOTE — ED Notes (Signed)
Report called to inpatient receiving nurse Claiborne Billings.

## 2014-08-24 NOTE — ED Notes (Signed)
Pt actively vomiting after given zofran

## 2014-08-24 NOTE — Progress Notes (Signed)
Request for admission to medical bed for evaluation of right flank pain and ureteral stone 3 mm in size. Urology consulted. No signe of an infection. TRH to admit to medical bed.  Faye Ramsay, MD  [facility] Pager [phone]  If 7PM-7AM, please contact night-coverage [email or web address] Password [email or web address]

## 2014-08-24 NOTE — ED Notes (Signed)
Pt falling off to sleep easily, will not give pain or nausea medication at this time. Wife agrees to plan. MD notified.

## 2014-08-25 LAB — CBC
HCT: 37 % — ABNORMAL LOW (ref 39.0–52.0)
HEMOGLOBIN: 11.7 g/dL — AB (ref 13.0–17.0)
MCH: 26.5 pg (ref 26.0–34.0)
MCHC: 31.6 g/dL (ref 30.0–36.0)
MCV: 83.7 fL (ref 78.0–100.0)
Platelets: 231 10*3/uL (ref 150–400)
RBC: 4.42 MIL/uL (ref 4.22–5.81)
RDW: 14.9 % (ref 11.5–15.5)
WBC: 7.9 10*3/uL (ref 4.0–10.5)

## 2014-08-25 LAB — BASIC METABOLIC PANEL
Anion gap: 5 (ref 5–15)
BUN: 28 mg/dL — ABNORMAL HIGH (ref 6–23)
CO2: 25 mmol/L (ref 19–32)
CREATININE: 1.32 mg/dL (ref 0.50–1.35)
Calcium: 8.1 mg/dL — ABNORMAL LOW (ref 8.4–10.5)
Chloride: 107 mmol/L (ref 96–112)
GFR, EST AFRICAN AMERICAN: 64 mL/min — AB (ref >90)
GFR, EST NON AFRICAN AMERICAN: 55 mL/min — AB (ref >90)
Glucose, Bld: 269 mg/dL — ABNORMAL HIGH (ref 70–99)
Potassium: 4.3 mmol/L (ref 3.5–5.1)
Sodium: 137 mmol/L (ref 135–145)

## 2014-08-25 LAB — GLUCOSE, CAPILLARY
GLUCOSE-CAPILLARY: 267 mg/dL — AB (ref 70–99)
Glucose-Capillary: 244 mg/dL — ABNORMAL HIGH (ref 70–99)
Glucose-Capillary: 234 mg/dL — ABNORMAL HIGH (ref 70–99)
Glucose-Capillary: 260 mg/dL — ABNORMAL HIGH (ref 70–99)

## 2014-08-25 MED ORDER — SENNA 8.6 MG PO TABS
1.0000 | ORAL_TABLET | Freq: Two times a day (BID) | ORAL | Status: DC
  Administered 2014-08-25 – 2014-08-26 (×3): 8.6 mg via ORAL
  Filled 2014-08-25 (×3): qty 1

## 2014-08-25 MED ORDER — INSULIN GLARGINE 100 UNIT/ML ~~LOC~~ SOLN
80.0000 [IU] | Freq: Every day | SUBCUTANEOUS | Status: DC
  Administered 2014-08-26: 80 [IU] via SUBCUTANEOUS
  Filled 2014-08-25: qty 0.8

## 2014-08-25 MED ORDER — HYDRALAZINE HCL 20 MG/ML IJ SOLN: 5.0000 mg | INTRAMUSCULAR | Status: DC | PRN

## 2014-08-25 MED ORDER — BISACODYL 10 MG RE SUPP: 10.0000 mg | Freq: Every day | RECTAL | Status: DC | PRN

## 2014-08-25 NOTE — Progress Notes (Signed)
Patient ID: [name] [name], male   DOB: [birth year], 64 y.o.   MRN: [id number]  [facility] PROGRESS NOTE  [name] [name] KCL:[id number] DOB: 06-11-1950 DOA: 08/24/2014 PCP: Walker Kehr, MD   Brief narrative:    Patient is 64 year old male with hypertension and hyperlipidemia, presented to [place] with main concern of sudden onset left-sided flank pain that started several hours prior to this admission, sharp and intermittent in nature, 10/10 in severity, occasionally but not consistently radiating to anterior left sided abdominal area, worse with movement and deep breaths, no specific alleviating factors, associated with nausea and nonbloody vomiting, poor oral intake. Patient denies similar episodes in the past. Patient also denies fevers and chills, no other abdominal concerns such as diarrhea or constipation, no specific urinary concerns such as dysuria, urinary urgency or frequency. Patient does report noticing some hematuria. Patient denies chest pain or shortness of breath.  In emergency department, patient noted to be hemodynamically stable, vital signs stable, CT scan notable for 3 mm obstructing stone at the right UVJ with associated mild right hydronephrosis, 8 mm nonobstructing left renal calculus. Urology consulted by emergency room doctor, Logan asked to admit for further evaluation.   Assessment/Plan:    Active Problems: Right UVJ stone, left renal calculus - pt reports feeling better but still with intermittent pain and nausea, no vomiting  - continue IV fluids, provide analgesia and antiemetics as needed - Follow-up on urologist recommendations Mild right hydroureteronephrosis - Secondary to the above, right UVJ stone - IV fluids and analgesia as needed - Recommendations per urologist Hypokalemia - supplemented and WNL this AM Hypertension - Continue home medical regimen with losartan, metoprolol, Norvasc - Hold Lasix for now while patient receiving IV  fluids - add hydralazine as need  Hyperlipidemia - Continue statin Diabetes mellitus type 2 with complications of ED - Place on sliding scale insulin for now, A1c pending  - continue Lantus and increase the dose to 80 U  DVT prophylaxis - Lovenox SQ Morbid obesity  - Body mass index is 46.53 kg/(m^2).  Code Status: Full.  Family Communication:  plan of care discussed with the patient Disposition Plan: Home when stable.   IV access:  Peripheral IV  Procedures and diagnostic studies:    Ct Renal Stone Study  08/24/2014   3 mm obstructive stone at the right UVJ with secondary very mild right hydroureteronephrosis and mild right periureteral fat stranding. 8 mm nonobstructive left renal calculus.  Very mild sigmoid diverticulosis without evidence for acute diverticulitis. Small hiatal hernia.   Medical Consultants:  Urology   Other Consultants:  None  IAnti-Infectives:   None   Faye Ramsay, MD  [facility] Pager [phone]  If 7PM-7AM, please contact night-coverage [email or web address] Password [email or web address] 08/25/2014, 11:31 AM   LOS: 1 day   HPI/Subjective: No events overnight. Still with intermittent nausea and bilateral flank pain, much better this AM. PT tolerating diet well.   Objective: Filed Vitals:   08/24/14 1047 08/24/14 1551 08/24/14 2206 08/25/14 0643  BP: 154/54 161/72 134/52 174/72  Pulse: 91 88 78 78  Temp: 98.2 F (36.8 C) 98.1 F (36.7 C) 98.6 F (37 C) 98.1 F (36.7 C)  TempSrc: Oral Oral Oral Oral  Resp: 18 18 18 18   Height: 5\' 7"  (1.702 m)     Weight: 134.8 kg (297 lb 2.9 oz)     SpO2: 94% 95% 92% 96%    Intake/Output Summary (Last 24 hours) at 08/25/14 1131 Last data filed at 08/25/14 0500  Gross per 24 hour  Intake    780 ml  Output    200 ml  Net    580 ml    Exam:   General:  Pt is alert, follows commands appropriately, not in acute distress  Cardiovascular: Regular rate and rhythm, S1/S2, no murmurs, no rubs, no gallops  Respiratory:  Clear to auscultation bilaterally, no wheezing, no crackles, no rhonchi  Abdomen: Soft, non tender, non distended, bowel sounds present, no guarding  Extremities: No edema, pulses DP and PT palpable bilaterally  Data Reviewed: Basic Metabolic Panel:  Recent Labs Lab 08/24/14 0455 08/25/14 0427  NA 138 137  K 3.4* 4.3  CL 104 107  CO2 26 25  GLUCOSE 255* 269*  BUN 20 28*  CREATININE 1.06 1.32  CALCIUM 8.9 8.1*   CBC:  Recent Labs Lab 08/24/14 0455 08/25/14 0427  WBC 7.7 7.9  NEUTROABS 5.0  --   HGB 14.0 11.7*  HCT 42.2 37.0*  MCV 79.3 83.7  PLT 269 231   CBG:  Recent Labs Lab 08/24/14 1620 08/24/14 2208 08/25/14 0742  GLUCAP 206* 293* 267*   Scheduled Meds: . alfuzosin  10 mg Oral Daily  . amLODipine  10 mg Oral Daily  . aspirin EC  81 mg Oral Daily  . enoxaparin  injection  70 mg Subcutaneous Q24H  . insulin aspart  0-9 Units Subcutaneous TID WC  . insulin glargine  60 Units Subcutaneous Daily  . losartan  100 mg Oral Daily  . metoprolol succinate  25 mg Oral Daily  . rosuvastatin  20 mg Oral q1800  . tamsulosin  0.4 mg Oral Daily   Continuous Infusions: . sodium chloride 75 mL/hr at 08/24/14 2105

## 2014-08-26 LAB — HEMOGLOBIN A1C
HEMOGLOBIN A1C: 8.6 % — AB (ref 4.8–5.6)
Mean Plasma Glucose: 200 mg/dL

## 2014-08-26 LAB — BASIC METABOLIC PANEL
Anion gap: 4 — ABNORMAL LOW (ref 5–15)
BUN: 20 mg/dL (ref 6–23)
CALCIUM: 7.8 mg/dL — AB (ref 8.4–10.5)
CO2: 27 mmol/L (ref 19–32)
Chloride: 104 mmol/L (ref 96–112)
Creatinine, Ser: 1.1 mg/dL (ref 0.50–1.35)
GFR calc Af Amer: 80 mL/min — ABNORMAL LOW (ref >90)
GFR, EST NON AFRICAN AMERICAN: 69 mL/min — AB (ref >90)
GLUCOSE: 248 mg/dL — AB (ref 70–99)
Potassium: 4.3 mmol/L (ref 3.5–5.1)
Sodium: 135 mmol/L (ref 135–145)

## 2014-08-26 LAB — CBC
HEMATOCRIT: 37 % — AB (ref 39.0–52.0)
HEMOGLOBIN: 11.6 g/dL — AB (ref 13.0–17.0)
MCH: 26.1 pg (ref 26.0–34.0)
MCHC: 31.4 g/dL (ref 30.0–36.0)
MCV: 83.3 fL (ref 78.0–100.0)
PLATELETS: 225 10*3/uL (ref 150–400)
RBC: 4.44 MIL/uL (ref 4.22–5.81)
RDW: 14.5 % (ref 11.5–15.5)
WBC: 5.6 10*3/uL (ref 4.0–10.5)

## 2014-08-26 LAB — GLUCOSE, CAPILLARY
GLUCOSE-CAPILLARY: 202 mg/dL — AB (ref 70–99)
Glucose-Capillary: 241 mg/dL — ABNORMAL HIGH (ref 70–99)

## 2014-08-26 MED ORDER — HYDROCODONE-ACETAMINOPHEN 5-325 MG PO TABS: 1.0000 | ORAL_TABLET | ORAL | Status: DC | PRN

## 2014-08-26 MED ORDER — ONDANSETRON HCL 4 MG PO TABS: 4.0000 mg | ORAL_TABLET | Freq: Four times a day (QID) | ORAL | Status: DC | PRN

## 2014-08-26 NOTE — Progress Notes (Signed)
reviwed discharge instructions and medication with patient. Patient able to teachback education and medication. Patient able to teachback reasons to call MD and 911. Patient verbalizes understanding of all education and agreeable for discharge.

## 2014-08-26 NOTE — Discharge Summary (Signed)
Physician Discharge Summary  [name] [name] ZOX:[id number] DOB: 1951/02/14 DOA: 08/24/2014  PCP: Walker Kehr, MD  Admit date: 08/24/2014 Discharge date: 08/26/2014  Recommendations for Outpatient Follow-up:  1. Pt will need to follow up with PCP in 2-3 weeks post discharge 2. Please obtain BMP to evaluate electrolytes and kidney function 3. Please also check CBC to evaluate Hg and Hct levels 4. Please note that urine culture pending upon discharge, pt with no urinary concerns 5. Pt to see Dr. Karsten Ro on April 26th, 2016 for follow up 6.  Discharge Diagnoses:  Active Problems:   Right ureteral stone   Acute right flank pain   Right flank pain  Discharge Condition: Stable  Diet recommendation: Heart healthy diet discussed in details   Brief narrative:    Patient is 64 year old male with hypertension and hyperlipidemia, presented to [place] with main concern of sudden onset left-sided flank pain that started several hours prior to this admission, sharp and intermittent in nature, 10/10 in severity, occasionally but not consistently radiating to anterior left sided abdominal area, worse with movement and deep breaths, no specific alleviating factors, associated with nausea and nonbloody vomiting, poor oral intake. Patient denies similar episodes in the past. Patient also denies fevers and chills, no other abdominal concerns such as diarrhea or constipation, no specific urinary concerns such as dysuria, urinary urgency or frequency. Patient does report noticing some hematuria. Patient denies chest pain or shortness of breath.  In emergency department, patient noted to be hemodynamically stable, vital signs stable, CT scan notable for 3 mm obstructing stone at the right UVJ with associated mild right hydronephrosis, 8 mm nonobstructing left renal calculus. Urology consulted by emergency room doctor, Bailey asked to admit for further evaluation.   Assessment/Plan:    Active  Problems: Right UVJ stone, left renal calculus - pt reports feeling better, has not passed stone but with minimal pain 1/10 this AM  - continued IV fluids, provided analgesia and antiemetics as needed and pt has responded well  - wants to go home today - Follow-up with Dr. Karsten Ro as noted below  Mild right hydroureteronephrosis - Secondary to the above, right UVJ stone - has not passed the stones yet  Hypokalemia - supplemented and WNL this AM Hypertension - Continue home medical regimen with losartan, metoprolol, Norvasc Hyperlipidemia - Continue statin Diabetes mellitus type 2 with complications of ED - continue home medical regimen upon discharge  DVT prophylaxis - Lovenox SQ provided while inpatient  Morbid obesity  - Body mass index is 46.53 kg/(m^2).  Code Status: Full.  Family Communication: plan of care discussed with the patient Disposition Plan: Home   IV access:  Peripheral IV  Procedures and diagnostic studies:   Ct Renal Stone Study 08/24/2014 3 mm obstructive stone at the right UVJ with secondary very mild right hydroureteronephrosis and mild right periureteral fat stranding. 8 mm nonobstructive left renal calculus. Very mild sigmoid diverticulosis without evidence for acute diverticulitis. Small hiatal hernia.   Medical Consultants:  Urology   Other Consultants:  None  IAnti-Infectives:   None       Discharge Exam: Filed Vitals:   08/26/14 0614  BP: 166/72  Pulse: 79  Temp: 98.3 F (36.8 C)  Resp: 20   Filed Vitals:   08/25/14 0643 08/25/14 1420 08/25/14 2200 08/26/14 0614  BP: 174/72 160/70 150/71 166/72  Pulse: 78 74 69 79  Temp: 98.1 F (36.7 C) 98.3 F (36.8 C) 98.4 F (36.9 C) 98.3 F (36.8 C)  TempSrc: Oral Oral Oral Oral  Resp: 18 18 18 20   Height:      Weight:      SpO2: 96% 94% 95% 97%    General: Pt is alert, follows commands appropriately, not in acute distress Cardiovascular: Regular rate and  rhythm, S1/S2 +, no murmurs, no rubs, no gallops Respiratory: Clear to auscultation bilaterally, no wheezing, no crackles, no rhonchi Abdominal: Soft, non tender, non distended, bowel sounds +, no guarding Extremities: no edema, no cyanosis, pulses palpable bilaterally DP and PT Neuro: Grossly nonfocal  Discharge Instructions     Medication List    STOP taking these medications        ketorolac 10 MG tablet  Commonly known as:  TORADOL     meclizine 12.5 MG tablet  Commonly known as:  ANTIVERT      TAKE these medications        alfuzosin 10 MG 24 hr tablet  Commonly known as:  UROXATRAL  Take 1 tablet (10 mg total) by mouth daily.     amLODipine 10 MG tablet  Commonly known as:  NORVASC  Take 1 tablet (10 mg total) by mouth daily.     aspirin 81 MG tablet  Take 81 mg by mouth daily.     dicyclomine 20 MG tablet  Commonly known as:  BENTYL  Take 1 tablet (20 mg total) by mouth every 6 (six) hours as needed for spasms (for abdominal cramping).     furosemide 40 MG tablet  Commonly known as:  LASIX  Take 1 tablet (40 mg total) by mouth daily.     HYDROcodone-acetaminophen 5-325 MG per tablet  Commonly known as:  NORCO/VICODIN  Take 1-2 tablets by mouth every 4 (four) hours as needed for moderate pain.     Insulin Glargine 100 UNIT/ML Solostar Pen  Commonly known as:  LANTUS  Inject 130 Units into the skin every morning.     losartan 100 MG tablet  Commonly known as:  COZAAR  Take 1 tablet (100 mg total) by mouth daily.     metoprolol succinate 25 MG 24 hr tablet  Commonly known as:  TOPROL XL  Take 1 tablet (25 mg total) by mouth daily.     ondansetron 4 MG tablet  Commonly known as:  ZOFRAN  Take 1 tablet (4 mg total) by mouth every 6 (six) hours as needed for nausea.     promethazine-codeine 6.25-10 MG/5ML syrup  Commonly known as:  PHENERGAN with CODEINE  Take 5 mLs by mouth every 4 (four) hours as needed.     rosuvastatin 20 MG tablet  Commonly  known as:  CRESTOR  Take 1 tablet (20 mg total) by mouth daily.     tamsulosin 0.4 MG Caps capsule  Commonly known as:  FLOMAX  Take 1 capsule (0.4 mg total) by mouth daily.            Follow-up Information    Follow up with Claybon Jabs, MD On 09/03/2014.   Specialty:  Urology   Why:  appointment scheduled on April 26th, 2016 at 10:30 am,  please arrive 10 minutes earlier    Contact information:   [place] [address] [phone]       Call Faye Ramsay, MD.   Specialty:  Internal Medicine   Why:  As needed call my cell phone [phone]   Contact information:   [address] [place] [address] [phone]  Follow up with Walker Kehr, MD.   Specialty:  Internal Medicine   Contact information:   [place] [address] [phone]        The results of significant diagnostics from this hospitalization (including imaging, microbiology, ancillary and laboratory) are listed below for reference.     Microbiology: Recent Results (from the past 240 hour(s))  Urine culture     Status: None (Preliminary result)   Collection Time: 08/24/14  1:15 PM  Result Value Ref Range Status   Specimen Description URINE, CLEAN CATCH  Final   Special Requests NONE  Final   Culture   Final    Culture reincubated for better growth Performed at [facility]    Report Status PENDING  Incomplete     Labs: Basic Metabolic Panel:  Recent Labs Lab 08/24/14 0455 08/25/14 0427 08/26/14 0421  NA 138 137 135  K 3.4* 4.3 4.3  CL 104 107 104  CO2 26 25 27   GLUCOSE 255* 269* 248*  BUN 20 28* 20  CREATININE 1.06 1.32 1.10  CALCIUM 8.9 8.1* 7.8*   CBC:  Recent Labs Lab 08/24/14 0455 08/25/14 0427 08/26/14 0421  WBC 7.7 7.9 5.6  NEUTROABS 5.0  --   --   HGB 14.0 11.7* 11.6*  HCT 42.2 37.0* 37.0*  MCV 79.3 83.7 83.3  PLT 269 231 225   CBG:  Recent Labs Lab 08/25/14 0742 08/25/14 1227  08/25/14 1732 08/25/14 2203 08/26/14 0728  GLUCAP 267* 244* 234* 260* 202*     SIGNED: Time coordinating discharge: Over 30 minutes  Faye Ramsay, MD  [facility] 08/26/2014, 11:25 AM Pager [phone]  If 7PM-7AM, please contact night-coverage [email or web address] Password [email or web address]

## 2014-08-26 NOTE — Progress Notes (Signed)
Inpatient Diabetes Program Recommendations  AACE/ADA: New Consensus Statement on Inpatient Glycemic Control (2013)  Target Ranges:  Prepandial:   less than 140 mg/dL      Peak postprandial:   less than 180 mg/dL (1-2 hours)      Critically ill patients:  140 - 180 mg/dL   Reason for Assessment: Hyperglycemia  Results for AFFAN, CALLOW (MRN [id number]) as of 08/26/2014 10:35  Ref. Range 08/25/2014 07:42 08/25/2014 12:27 08/25/2014 17:32 08/25/2014 22:03 08/26/2014 07:28  Glucose-Capillary Latest Ref Range: 70-99 mg/dL 267 (H) 244 (H) 234 (H) 260 (H) 202 (H)   Results for AIYDEN, LAUDERBACK (MRN [id number]) as of 08/26/2014 10:35  Ref. Range 07/31/2014 11:14  Hemoglobin A1C Latest Ref Range: 4.6-6.5 % 9.0 (H)   Lantus increased to 80 units to begin tonight. Needs meal coverage insulin and increase in correction.  Consider adding Novolog 6 units tidwc for meal coverage insulin. Increase Novolog to moderate tidwc with added HS correction.  Will continue to follow. Thank you. Lorenda Peck, RD, LDN, CDE Inpatient Diabetes Coordinator [phone]

## 2014-08-28 LAB — URINE CULTURE: Colony Count: 10000

## 2014-09-04 ENCOUNTER — Ambulatory Visit (INDEPENDENT_AMBULATORY_CARE_PROVIDER_SITE_OTHER): Payer: Managed Care, Other (non HMO) | Admitting: Internal Medicine

## 2014-09-04 ENCOUNTER — Encounter: Payer: Self-pay | Admitting: Internal Medicine

## 2014-09-04 VITALS — BP 120/74 | HR 77 | Wt 293.0 lb

## 2014-09-04 DIAGNOSIS — IMO0002 Reserved for concepts with insufficient information to code with codable children: Secondary | ICD-10-CM

## 2014-09-04 DIAGNOSIS — N200 Calculus of kidney: Secondary | ICD-10-CM

## 2014-09-04 DIAGNOSIS — E1165 Type 2 diabetes mellitus with hyperglycemia: Secondary | ICD-10-CM | POA: Diagnosis not present

## 2014-09-04 DIAGNOSIS — I2510 Atherosclerotic heart disease of native coronary artery without angina pectoris: Secondary | ICD-10-CM

## 2014-09-04 DIAGNOSIS — I10 Essential (primary) hypertension: Secondary | ICD-10-CM | POA: Diagnosis not present

## 2014-09-04 MED ORDER — PROMETHAZINE-CODEINE 6.25-10 MG/5ML PO SYRP: 5.0000 mL | ORAL_SOLUTION | ORAL | Status: DC | PRN

## 2014-09-04 MED ORDER — KETOROLAC TROMETHAMINE 10 MG PO TABS: 10.0000 mg | ORAL_TABLET | Freq: Three times a day (TID) | ORAL | Status: DC | PRN

## 2014-09-04 MED ORDER — TAMSULOSIN HCL 0.4 MG PO CAPS: 0.4000 mg | ORAL_CAPSULE | Freq: Every day | ORAL | Status: DC

## 2014-09-04 NOTE — Assessment & Plan Note (Signed)
  On Lantus

## 2014-09-04 NOTE — Progress Notes (Signed)
Subjective:    HPI  Post-hosp f/u:  Admit date: 08/24/2014 Discharge date: 08/26/2014  Recommendations for Outpatient Follow-up:  1. Pt will need to follow up with PCP in 2-3 weeks post discharge 2. Please obtain BMP to evaluate electrolytes and kidney function 3. Please also check CBC to evaluate Hg and Hct levels 4. Please note that urine culture pending upon discharge, pt with no urinary concerns 5. Pt to see Dr. Karsten Ro on April 26th, 2016 for follow up 6.  Discharge Diagnoses:  Active Problems:  Right ureteral stone  Acute right flank pain  Right flank pain  Discharge Condition: Stable  Diet recommendation: Heart healthy diet discussed in details   Brief narrative:    Patient is 64 year old male with hypertension and hyperlipidemia, presented to [facility] with main concern of sudden onset left-sided flank pain that started several hours prior to this admission, sharp and intermittent in nature, 10/10 in severity, occasionally but not consistently radiating to anterior left sided abdominal area, worse with movement and deep breaths, no specific alleviating factors, associated with nausea and nonbloody vomiting, poor oral intake. Patient denies similar episodes in the past. Patient also denies fevers and chills, no other abdominal concerns such as diarrhea or constipation, no specific urinary concerns such as dysuria, urinary urgency or frequency. Patient does report noticing some hematuria. Patient denies chest pain or shortness of breath.  In emergency department, patient noted to be hemodynamically stable, vital signs stable, CT scan notable for 3 mm obstructing stone at the right UVJ with associated mild right hydronephrosis, 8 mm nonobstructing left renal calculus. Urology consulted by emergency room doctor, [place] asked to admit for further evaluation.   Assessment/Plan:    Active Problems: Right UVJ stone, left renal calculus - pt reports feeling  better, has not passed stone but with minimal pain 1/10 this AM  - continued IV fluids, provided analgesia and antiemetics as needed and pt has responded well  - wants to go home today - Follow-up with Dr. Karsten Ro as noted below  Mild right hydroureteronephrosis - Secondary to the above, right UVJ stone - has not passed the stones yet  Hypokalemia - supplemented and WNL this AM Hypertension - Continue home medical regimen with losartan, metoprolol, Norvasc Hyperlipidemia - Continue statin Diabetes mellitus type 2 with complications of ED - continue home medical regimen upon discharge  DVT prophylaxis - Lovenox SQ provided while inpatient  Morbid obesity  - Body mass index is 46.53 kg/(m^2).  Code Status: Full.  Family Communication: plan of care discussed with the patient Disposition Plan: Home   IV access:  Peripheral IV  Procedures and diagnostic studies:   Ct Renal Stone Study 08/24/2014 3 mm obstructive stone at the right UVJ with secondary very mild right hydroureteronephrosis and mild right periureteral fat stranding. 8 mm nonobstructive left renal calculus. Very mild sigmoid diverticulosis without evidence for acute diverticulitis. Small hiatal hernia.   Medical Consultants:  Urology   Other Consultants:  None  IAnti-Infectives:   None       Discharge Exam: Filed Vitals:   08/26/14 0614  BP: 166/72  Pulse: 79  Temp: 98.3 F (36.8 C)  Resp: 20   Filed Vitals:   08/25/14 0643 08/25/14 1420 08/25/14 2200 08/26/14 0614  BP: 174/72 160/70 150/71 166/72  Pulse: 78 74 69 79  Temp: 98.1 F (36.7 C) 98.3 F (36.8 C) 98.4 F (36.9 C) 98.3 F (36.8 C)  TempSrc: Oral Oral Oral Oral  Resp: 18 18  18 20  Height:      Weight:      SpO2: 96% 94% 95% 97%    General: Pt is alert, follows commands appropriately, not in acute distress Cardiovascular: Regular rate and  rhythm, S1/S2 +, no murmurs, no rubs, no gallops Respiratory: Clear to auscultation bilaterally, no wheezing, no crackles, no rhonchi Abdominal: Soft, non tender, non distended, bowel sounds +, no guarding Extremities: no edema, no cyanosis, pulses palpable bilaterally DP and PT Neuro: Grossly nonfocal         The patient presents for a follow-up of  chronic hypertension, chronic dyslipidemia, type 2 diabetes. Seeing Dr Loanne Drilling. F/u leg swelling.   Wt Readings from Last 3 Encounters:  09/04/14 293 lb (132.904 kg)  08/24/14 297 lb 2.9 oz (134.8 kg)  07/31/14 290 lb (131.543 kg)   BP Readings from Last 3 Encounters:  09/04/14 120/74  08/26/14 166/72  07/31/14 162/80      Review of Systems  Constitutional: Positive for unexpected weight change. Negative for appetite change and fatigue.  HENT: Negative for congestion, nosebleeds, sneezing, sore throat and trouble swallowing.   Eyes: Positive for visual disturbance. Negative for itching.  Respiratory: Positive for cough.   Cardiovascular: Positive for leg swelling. Negative for chest pain and palpitations.  Gastrointestinal: Negative for nausea, diarrhea, blood in stool and abdominal distention.  Genitourinary: Positive for urgency and frequency. Negative for hematuria.  Musculoskeletal: Negative for back pain, joint swelling, gait problem and neck pain.  Skin: Negative for rash.  Neurological: Negative for dizziness, tremors, speech difficulty and weakness.  Psychiatric/Behavioral: Negative for suicidal ideas, sleep disturbance, dysphoric mood and agitation. The patient is not nervous/anxious.        Objective:   Physical Exam  Constitutional: He is oriented to person, place, and time. He appears well-developed. No distress.  NAD  HENT:  Mouth/Throat: Oropharynx is clear and moist.  Eyes: Conjunctivae are normal. Pupils are equal, round, and reactive to light.  Neck: Normal range of motion. No JVD present. No thyromegaly  present.  Cardiovascular: Normal rate, regular rhythm, normal heart sounds and intact distal pulses.  Exam reveals no gallop and no friction rub.   No murmur heard. Pulmonary/Chest: Effort normal and breath sounds normal. No respiratory distress. He has no wheezes. He has no rales. He exhibits no tenderness.  Abdominal: Soft. Bowel sounds are normal. He exhibits no distension and no mass. There is no tenderness. There is no rebound and no guarding.  Musculoskeletal: Normal range of motion. He exhibits no edema or tenderness.  Lymphadenopathy:    He has no cervical adenopathy.  Neurological: He is alert and oriented to person, place, and time. He has normal reflexes. No cranial nerve deficit. He exhibits normal muscle tone. He displays a negative Romberg sign. Coordination and gait normal.  No meningeal signs  Skin: Skin is warm and dry. No rash noted.  Psychiatric: He has a normal mood and affect. His behavior is normal. Judgment and thought content normal.    Lab Results  Component Value Date   WBC 5.6 08/26/2014   HGB 11.6* 08/26/2014   HCT 37.0* 08/26/2014   PLT 225 08/26/2014   GLUCOSE 248* 08/26/2014   CHOL 217* 06/17/2014   TRIG 98.0 06/17/2014   HDL 57.30 06/17/2014   LDLCALC 140* 06/17/2014   ALT 14 06/17/2014   AST 14 06/17/2014   NA 135 08/26/2014   K 4.3 08/26/2014   CL 104 08/26/2014   CREATININE 1.10 08/26/2014   BUN  20 08/26/2014   CO2 27 08/26/2014   TSH 1.81 07/17/2012   PSA 0.50 07/17/2012   INR 1.08 02/02/2011   HGBA1C 8.6* 08/25/2014   MICROALBUR 138.8 Verified by manual dilution.* 01/30/2014        Assessment & Plan:

## 2014-09-04 NOTE — Assessment & Plan Note (Signed)
Toprol XL, ASA, Amlodipine, Losartan

## 2014-09-04 NOTE — Assessment & Plan Note (Addendum)
2/16 L, 4/16 R F/u w/Dr Karsten Ro Added Toradol po prn. Cont Flomax. Zofran, Norco prn

## 2014-09-04 NOTE — Assessment & Plan Note (Signed)
Toprol XL, ASA, Amlodipine, Crestor 

## 2014-09-04 NOTE — Progress Notes (Signed)
Pre visit review using our clinic review tool, if applicable. No additional management support is needed unless otherwise documented below in the visit note. 

## 2014-10-09 ENCOUNTER — Ambulatory Visit: Payer: Managed Care, Other (non HMO) | Admitting: Endocrinology

## 2014-11-05 ENCOUNTER — Telehealth: Payer: Self-pay | Admitting: Internal Medicine

## 2014-11-05 DIAGNOSIS — I10 Essential (primary) hypertension: Secondary | ICD-10-CM

## 2014-11-05 MED ORDER — AMLODIPINE BESYLATE 10 MG PO TABS: 10.0000 mg | ORAL_TABLET | Freq: Every day | ORAL | Status: DC

## 2014-11-05 MED ORDER — ALFUZOSIN HCL ER 10 MG PO TB24: 10.0000 mg | ORAL_TABLET | Freq: Every day | ORAL | Status: DC

## 2014-11-05 MED ORDER — LOSARTAN POTASSIUM 100 MG PO TABS: 100.0000 mg | ORAL_TABLET | Freq: Every day | ORAL | Status: DC

## 2014-11-05 MED ORDER — METOPROLOL SUCCINATE ER 25 MG PO TB24: 25.0000 mg | ORAL_TABLET | Freq: Every day | ORAL | Status: DC

## 2014-11-05 MED ORDER — FUROSEMIDE 40 MG PO TABS: 40.0000 mg | ORAL_TABLET | Freq: Every day | ORAL | Status: DC

## 2014-11-05 NOTE — Telephone Encounter (Signed)
Patient stated that he need new prescriptions for these drugs and sent to [facility] 90 day supply Amlodipine 40 mg,  Metoprolol 25 mg, Alfuzosin 10 mg, Losartan 100 mg, Furosemide 40 mg. He didn't give me a fax or phone #I have attempted to contact this patient if any questions.

## 2014-11-05 NOTE — Telephone Encounter (Signed)
Rx sent. See meds. Patient was informed of refill sent to pharmacy

## 2014-12-18 ENCOUNTER — Encounter: Payer: Self-pay | Admitting: Internal Medicine

## 2014-12-18 ENCOUNTER — Ambulatory Visit (INDEPENDENT_AMBULATORY_CARE_PROVIDER_SITE_OTHER): Payer: Managed Care, Other (non HMO) | Admitting: Internal Medicine

## 2014-12-18 VITALS — BP 130/80 | HR 62 | Wt 304.0 lb

## 2014-12-18 DIAGNOSIS — M5432 Sciatica, left side: Secondary | ICD-10-CM | POA: Diagnosis not present

## 2014-12-18 DIAGNOSIS — E1165 Type 2 diabetes mellitus with hyperglycemia: Secondary | ICD-10-CM | POA: Diagnosis not present

## 2014-12-18 DIAGNOSIS — E669 Obesity, unspecified: Secondary | ICD-10-CM

## 2014-12-18 DIAGNOSIS — M5430 Sciatica, unspecified side: Secondary | ICD-10-CM | POA: Insufficient documentation

## 2014-12-18 DIAGNOSIS — I10 Essential (primary) hypertension: Secondary | ICD-10-CM

## 2014-12-18 DIAGNOSIS — IMO0002 Reserved for concepts with insufficient information to code with codable children: Secondary | ICD-10-CM

## 2014-12-18 DIAGNOSIS — M5431 Sciatica, right side: Secondary | ICD-10-CM | POA: Insufficient documentation

## 2014-12-18 MED ORDER — CYCLOBENZAPRINE HCL ER 15 MG PO CP24: 15.0000 mg | ORAL_CAPSULE | Freq: Every day | ORAL | Status: DC | PRN

## 2014-12-18 MED ORDER — DICLOFENAC SODIUM 75 MG PO TBEC: 75.0000 mg | DELAYED_RELEASE_TABLET | Freq: Two times a day (BID) | ORAL | Status: DC

## 2014-12-18 MED ORDER — LORCASERIN HCL 10 MG PO TABS: 1.0000 | ORAL_TABLET | Freq: Two times a day (BID) | ORAL | Status: DC

## 2014-12-18 NOTE — Progress Notes (Signed)
Pre visit review using our clinic review tool, if applicable. No additional management support is needed unless otherwise documented below in the visit note. 

## 2014-12-18 NOTE — Progress Notes (Signed)
Subjective:  Patient ID: [name] [name], male    DOB: [birth year]  Age: 64 y.o. MRN: [id number]  CC: No chief complaint on file.   HPI Adyn Serna presents for kidney stone f/up. C/o L flank/buttock, cheek pain x 3 d  Outpatient Prescriptions Prior to Visit  Medication Sig Dispense Refill  . alfuzosin (UROXATRAL) 10 MG 24 hr tablet Take 1 tablet (10 mg total) by mouth daily. 90 tablet 3  . amLODipine (NORVASC) 10 MG tablet Take 1 tablet (10 mg total) by mouth daily. 90 tablet 3  . aspirin 81 MG tablet Take 81 mg by mouth daily.      Marland Kitchen dicyclomine (BENTYL) 20 MG tablet Take 1 tablet (20 mg total) by mouth every 6 (six) hours as needed for spasms (for abdominal cramping). 20 tablet 0  . furosemide (LASIX) 40 MG tablet Take 1 tablet (40 mg total) by mouth daily. 90 tablet 3  . Insulin Glargine (LANTUS) 100 UNIT/ML Solostar Pen Inject 130 Units into the skin every morning. 135 mL 3  . ketorolac (TORADOL) 10 MG tablet Take 1 tablet (10 mg total) by mouth every 8 (eight) hours as needed for severe pain (kidney stone pain). 12 tablet 0  . losartan (COZAAR) 100 MG tablet Take 1 tablet (100 mg total) by mouth daily. 90 tablet 3  . metoprolol succinate (TOPROL XL) 25 MG 24 hr tablet Take 1 tablet (25 mg total) by mouth daily. 90 tablet 3  . ondansetron (ZOFRAN) 4 MG tablet Take 1 tablet (4 mg total) by mouth every 6 (six) hours as needed for nausea. 65 tablet 0  . promethazine-codeine (PHENERGAN WITH CODEINE) 6.25-10 MG/5ML syrup Take 5 mLs by mouth every 4 (four) hours as needed. 300 mL 0  . rosuvastatin (CRESTOR) 20 MG tablet Take 1 tablet (20 mg total) by mouth daily. 90 tablet 3  . tamsulosin (FLOMAX) 0.4 MG CAPS capsule Take 1 capsule (0.4 mg total) by mouth daily after supper. 90 capsule 3   No facility-administered medications prior to visit.    ROS Review of Systems  Constitutional: Positive for unexpected weight change. Negative for appetite change and fatigue.  HENT: Negative for  congestion, nosebleeds, sneezing, sore throat and trouble swallowing.   Eyes: Negative for itching and visual disturbance.  Respiratory: Negative for cough.   Cardiovascular: Negative for chest pain, palpitations and leg swelling.  Gastrointestinal: Negative for nausea, diarrhea, blood in stool and abdominal distention.  Genitourinary: Negative for frequency and hematuria.  Musculoskeletal: Negative for back pain, joint swelling, gait problem and neck pain.  Skin: Negative for rash.  Neurological: Negative for dizziness, tremors, speech difficulty and weakness.  Psychiatric/Behavioral: Negative for suicidal ideas, sleep disturbance, dysphoric mood and agitation. The patient is not nervous/anxious.     Objective:  BP 130/80 mmHg  Pulse 62  Wt 304 lb (137.893 kg)  SpO2 96%  BP Readings from Last 3 Encounters:  12/18/14 130/80  09/04/14 120/74  08/26/14 166/72    Wt Readings from Last 3 Encounters:  12/18/14 304 lb (137.893 kg)  09/04/14 293 lb (132.904 kg)  08/24/14 297 lb 2.9 oz (134.8 kg)    Physical Exam  Constitutional: He is oriented to person, place, and time. He appears well-developed. No distress.  Obese NAD  HENT:  Mouth/Throat: Oropharynx is clear and moist.  Eyes: Conjunctivae are normal. Pupils are equal, round, and reactive to light.  Neck: Normal range of motion. No JVD present. No thyromegaly present.  Cardiovascular: Normal rate, regular rhythm,  normal heart sounds and intact distal pulses.  Exam reveals no gallop and no friction rub.   No murmur heard. Pulmonary/Chest: Effort normal and breath sounds normal. No respiratory distress. He has no wheezes. He has no rales. He exhibits no tenderness.  Abdominal: Soft. Bowel sounds are normal. He exhibits no distension and no mass. There is no tenderness. There is no rebound and no guarding.  Musculoskeletal: Normal range of motion. He exhibits no edema or tenderness.  Lymphadenopathy:    He has no cervical  adenopathy.  Neurological: He is alert and oriented to person, place, and time. He has normal reflexes. No cranial nerve deficit. He exhibits normal muscle tone. He displays a negative Romberg sign. Coordination and gait normal.  Skin: Skin is warm and dry. No rash noted.  Psychiatric: He has a normal mood and affect. His behavior is normal. Judgment and thought content normal.  L LS tender LEs 1+  Lab Results  Component Value Date   WBC 5.6 08/26/2014   HGB 11.6* 08/26/2014   HCT 37.0* 08/26/2014   PLT 225 08/26/2014   GLUCOSE 248* 08/26/2014   CHOL 217* 06/17/2014   TRIG 98.0 06/17/2014   HDL 57.30 06/17/2014   LDLCALC 140* 06/17/2014   ALT 14 06/17/2014   AST 14 06/17/2014   NA 135 08/26/2014   K 4.3 08/26/2014   CL 104 08/26/2014   CREATININE 1.10 08/26/2014   BUN 20 08/26/2014   CO2 27 08/26/2014   TSH 1.81 07/17/2012   PSA 0.50 07/17/2012   INR 1.08 02/02/2011   HGBA1C 8.6* 08/25/2014   MICROALBUR 138.8 Verified by manual dilution.* 01/30/2014    Ct Renal Stone Study  08/24/2014   CLINICAL DATA:  Initial evaluation for acute onset severe right flank pain.  EXAM: CT ABDOMEN AND PELVIS WITHOUT CONTRAST  TECHNIQUE: Multidetector CT imaging of the abdomen and pelvis was performed following the standard protocol without IV contrast.  COMPARISON:  Prior study from 06/14/2014.  FINDINGS: Subsegmental atelectasis seen dependently within the visualized lung bases. Visualized lungs are otherwise clear.  The liver demonstrates a normal unenhanced appearance. Punctate calcification noted within the left hepatic lobe. Gallbladder within normal limits. No biliary dilatation. Spleen, adrenal glands, and pancreas are within normal limits.  There is a in 8 mm nonobstructive stone within the upper pole left kidney. Additional punctate nonobstructive calculus present within the lower pole left kidney. No obstructive stone seen along the course of the left renal collecting system. There is no  left-sided hydronephrosis or hydroureter.  On the right, no radiopaque calculi identified within the right kidney. There is no did area mild right-sided hydronephrosis and hydroureter. Mild right periureteral fat stranding present. There is a very subtle punctate 3 mm stone at the right UVJ (series 510, image 95). No other radiopaque calculi seen along the course of the right renal collecting system.  Small hiatal hernia noted. Stomach otherwise unremarkable. No evidence for bowel obstruction. No acute inflammatory changes seen about the bowels. Minimal sigmoid diverticulosis present without evidence for diverticulitis. Appendix is normal.  Small fat containing paraumbilical hernia noted.  Bladder within normal limits.  Prostate unremarkable.  No free air or fluid. No adenopathy. Mild scattered atheromatous plaque present about the intra-abdominal aorta. No aneurysm.  No acute osseous abnormality. No worrisome lytic or blastic osseous lesions.  IMPRESSION: 1. 3 mm obstructive stone at the right UVJ with secondary very mild right hydroureteronephrosis and mild right periureteral fat stranding. 2. 8 mm nonobstructive left renal calculus.  3. Very mild sigmoid diverticulosis without evidence for acute diverticulitis. 4. Small hiatal hernia. 5. Small fat containing paraumbilical hernia.   Electronically Signed   By: Jeannine Boga M.D.   On: 08/24/2014 06:06    Assessment & Plan:   There are no diagnoses linked to this encounter. I am having Mr. Mom maintain his aspirin, rosuvastatin, dicyclomine, Insulin Glargine, ondansetron, tamsulosin, promethazine-codeine, ketorolac, amLODipine, furosemide, metoprolol succinate, alfuzosin, losartan, and B-D ULTRAFINE III SHORT PEN.  Meds ordered this encounter  Medications  . B-D ULTRAFINE III SHORT PEN 31G X 8 MM MISC    Sig: every morning.     Follow-up: No Follow-up on file.  Walker Kehr, MD

## 2014-12-18 NOTE — Assessment & Plan Note (Signed)
Toprol XL, ASA, Amlodipine, Losartan

## 2014-12-18 NOTE — Assessment & Plan Note (Signed)
Dr Loanne Drilling IDDM On Lantus

## 2014-12-18 NOTE — Assessment & Plan Note (Signed)
Use Amrix

## 2014-12-18 NOTE — Assessment & Plan Note (Signed)
Lap band discussed again

## 2015-02-26 ENCOUNTER — Encounter: Payer: Managed Care, Other (non HMO) | Admitting: Internal Medicine

## 2015-03-10 ENCOUNTER — Telehealth: Payer: Self-pay | Admitting: *Deleted

## 2015-03-10 NOTE — Telephone Encounter (Signed)
OK to fill this prescription with additional refills x0 Thank you!  

## 2015-03-10 NOTE — Telephone Encounter (Signed)
Left msg on triage requesting refill on Tussinex cough syrup...Johny Chess

## 2015-03-11 MED ORDER — HYDROCOD POLST-CPM POLST ER 10-8 MG/5ML PO SUER: 5.0000 mL | Freq: Two times a day (BID) | ORAL | Status: DC | PRN

## 2015-03-11 NOTE — Telephone Encounter (Signed)
Notified pt rx ready for pick-up.../lmb 

## 2015-03-26 ENCOUNTER — Other Ambulatory Visit: Payer: Self-pay

## 2015-03-26 ENCOUNTER — Ambulatory Visit (INDEPENDENT_AMBULATORY_CARE_PROVIDER_SITE_OTHER): Payer: Managed Care, Other (non HMO) | Admitting: Internal Medicine

## 2015-03-26 ENCOUNTER — Encounter: Payer: Self-pay | Admitting: Internal Medicine

## 2015-03-26 VITALS — BP 150/84 | HR 82 | Ht 70.0 in | Wt 308.0 lb

## 2015-03-26 DIAGNOSIS — I10 Essential (primary) hypertension: Secondary | ICD-10-CM

## 2015-03-26 DIAGNOSIS — Z0000 Encounter for general adult medical examination without abnormal findings: Secondary | ICD-10-CM

## 2015-03-26 DIAGNOSIS — G4733 Obstructive sleep apnea (adult) (pediatric): Secondary | ICD-10-CM | POA: Diagnosis not present

## 2015-03-26 DIAGNOSIS — Z794 Long term (current) use of insulin: Secondary | ICD-10-CM

## 2015-03-26 DIAGNOSIS — E1159 Type 2 diabetes mellitus with other circulatory complications: Secondary | ICD-10-CM

## 2015-03-26 DIAGNOSIS — I2510 Atherosclerotic heart disease of native coronary artery without angina pectoris: Secondary | ICD-10-CM

## 2015-03-26 DIAGNOSIS — R059 Cough, unspecified: Secondary | ICD-10-CM

## 2015-03-26 DIAGNOSIS — R05 Cough: Secondary | ICD-10-CM

## 2015-03-26 DIAGNOSIS — IMO0002 Reserved for concepts with insufficient information to code with codable children: Secondary | ICD-10-CM

## 2015-03-26 DIAGNOSIS — Z23 Encounter for immunization: Secondary | ICD-10-CM | POA: Diagnosis not present

## 2015-03-26 DIAGNOSIS — E1165 Type 2 diabetes mellitus with hyperglycemia: Secondary | ICD-10-CM

## 2015-03-26 MED ORDER — INSULIN GLARGINE 100 UNIT/ML SOLOSTAR PEN: 130.0000 [IU] | PEN_INJECTOR | SUBCUTANEOUS | Status: DC

## 2015-03-26 MED ORDER — HYDROCOD POLST-CPM POLST ER 10-8 MG/5ML PO SUER: 5.0000 mL | Freq: Two times a day (BID) | ORAL | Status: DC | PRN

## 2015-03-26 MED ORDER — AZITHROMYCIN 250 MG PO TABS: ORAL_TABLET | ORAL | Status: DC

## 2015-03-26 MED ORDER — INSULIN DEGLUDEC 200 UNIT/ML ~~LOC~~ SOPN: 130.0000 [IU] | PEN_INJECTOR | Freq: Every day | SUBCUTANEOUS | Status: DC

## 2015-03-26 MED ORDER — AZITHROMYCIN 250 MG PO TABS
ORAL_TABLET | ORAL | Status: DC
Start: 2015-03-26 — End: 2015-05-21

## 2015-03-26 MED ORDER — LORCASERIN HCL 10 MG PO TABS: 1.0000 | ORAL_TABLET | Freq: Two times a day (BID) | ORAL | Status: DC

## 2015-03-26 MED ORDER — HYDROCOD POLST-CPM POLST ER 10-8 MG/5ML PO SUER
5.0000 mL | Freq: Two times a day (BID) | ORAL | Status: DC | PRN
Start: 2015-03-26 — End: 2015-07-02

## 2015-03-26 NOTE — Progress Notes (Signed)
Pre visit review using our clinic review tool, if applicable. No additional management support is needed unless otherwise documented below in the visit note. 

## 2015-03-26 NOTE — Assessment & Plan Note (Signed)
Toprol XL, ASA, Amlodipine, Losartan

## 2015-03-26 NOTE — Assessment & Plan Note (Signed)
IDDM On Lantus 11/16 - will try to change to [place] 200

## 2015-03-26 NOTE — Assessment & Plan Note (Signed)
appt w/Dr Elsworth Soho

## 2015-03-26 NOTE — Assessment & Plan Note (Signed)
Tussionex prn 

## 2015-03-26 NOTE — Progress Notes (Signed)
Subjective:  Patient ID: [name] [name], male    DOB: [birth year]  Age: 64 y.o. MRN: [id number]  CC: Annual Exam   HPI [name] [name] presents for HTN, DM, obesity f/u. C/o cough x 1 mo  Outpatient Prescriptions Prior to Visit  Medication Sig Dispense Refill  . amLODipine (NORVASC) 10 MG tablet Take 1 tablet (10 mg total) by mouth [name]. 90 tablet 3  . aspirin 81 MG tablet Take 81 mg by mouth [name].      . B-D ULTRAFINE III SHORT PEN 31G X 8 MM MISC every morning.    . diclofenac (VOLTAREN) 75 MG EC tablet Take 1 tablet (75 mg total) by mouth 2 (two) times [name]. 30 tablet 2  . dicyclomine (BENTYL) 20 MG tablet Take 1 tablet (20 mg total) by mouth every 6 (six) hours as needed for spasms (for abdominal cramping). 20 tablet 0  . furosemide (LASIX) 40 MG tablet Take 1 tablet (40 mg total) by mouth [name]. 90 tablet 3  . ketorolac (TORADOL) 10 MG tablet Take 1 tablet (10 mg total) by mouth every 8 (eight) hours as needed for severe pain (kidney stone pain). 12 tablet 0  . losartan (COZAAR) 100 MG tablet Take 1 tablet (100 mg total) by mouth [name]. 90 tablet 3  . metoprolol succinate (TOPROL XL) 25 MG 24 hr tablet Take 1 tablet (25 mg total) by mouth [name]. 90 tablet 3  . ondansetron (ZOFRAN) 4 MG tablet Take 1 tablet (4 mg total) by mouth every 6 (six) hours as needed for nausea. 65 tablet 0  . rosuvastatin (CRESTOR) 20 MG tablet Take 1 tablet (20 mg total) by mouth [name]. 90 tablet 3  . tamsulosin (FLOMAX) 0.4 MG CAPS capsule Take 1 capsule (0.4 mg total) by mouth [name] after supper. (Patient taking differently: Take 0.8 mg by mouth [name] after supper. ) 90 capsule 3  . chlorpheniramine-HYDROcodone (TUSSIONEX PENNKINETIC ER) 10-8 MG/5ML SUER Take 5 mLs by mouth every 12 (twelve) hours as needed for cough. 140 mL 0  . Insulin Glargine (LANTUS) 100 UNIT/ML Solostar Pen Inject 130 Units into the skin every morning. 135 mL 3  . Lorcaserin HCl (BELVIQ) 10 MG TABS Take 1 tablet by mouth 2 (two)  times [name]. 60 tablet 3  . alfuzosin (UROXATRAL) 10 MG 24 hr tablet Take 1 tablet (10 mg total) by mouth [name]. (Patient not taking: Reported on 03/26/2015) 90 tablet 3  . cyclobenzaprine (AMRIX) 15 MG 24 hr capsule Take 1 capsule (15 mg total) by mouth [name] as needed for muscle spasms. (Patient not taking: Reported on 03/26/2015) 30 capsule 0   No facility-administered medications prior to visit.    ROS Review of Systems  Constitutional: Negative for appetite change, fatigue and unexpected weight change.  HENT: Negative for congestion, nosebleeds, sneezing, sore throat and trouble swallowing.   Eyes: Negative for itching and visual disturbance.  Respiratory: Negative for cough.   Cardiovascular: Negative for chest pain, palpitations and leg swelling.  Gastrointestinal: Negative for nausea, diarrhea, blood in stool and abdominal distention.  Genitourinary: Negative for frequency and hematuria.  Musculoskeletal: Negative for back pain, joint swelling, gait problem and neck pain.  Skin: Negative for rash.  Neurological: Negative for dizziness, tremors, speech difficulty and weakness.  Psychiatric/Behavioral: Negative for suicidal ideas, sleep disturbance, dysphoric mood and agitation. The patient is not nervous/anxious.     Objective:  BP 150/84 mmHg  Pulse 82  Ht 5\' 10"  (1.778 m)  Wt 308 lb (139.708 kg)  BMI 44.19 kg/m2  SpO2 96%  BP Readings from Last 3 Encounters:  03/26/15 150/84  12/18/14 130/80  09/04/14 120/74    Wt Readings from Last 3 Encounters:  03/26/15 308 lb (139.708 kg)  12/18/14 304 lb (137.893 kg)  09/04/14 293 lb (132.904 kg)    Physical Exam  Constitutional: He is oriented to person, place, and time. He appears well-developed. No distress.  NAD  HENT:  Mouth/Throat: Oropharynx is clear and moist.  Eyes: Conjunctivae are normal. Pupils are equal, round, and reactive to light.  Neck: Normal range of motion. No JVD present. No thyromegaly present.    Cardiovascular: Normal rate, regular rhythm, normal heart sounds and intact distal pulses.  Exam reveals no gallop and no friction rub.   No murmur heard. Pulmonary/Chest: Effort normal and breath sounds normal. No respiratory distress. He has no wheezes. He has no rales. He exhibits no tenderness.  Abdominal: Soft. Bowel sounds are normal. He exhibits no distension and no mass. There is no tenderness. There is no rebound and no guarding.  Musculoskeletal: Normal range of motion. He exhibits no edema or tenderness.  Lymphadenopathy:    He has no cervical adenopathy.  Neurological: He is alert and oriented to person, place, and time. He has normal reflexes. No cranial nerve deficit. He exhibits normal muscle tone. He displays a negative Romberg sign. Coordination and gait normal.  Skin: Skin is warm and dry. No rash noted.  Psychiatric: He has a normal mood and affect. His behavior is normal. Judgment and thought content normal.  Obese Pt declined rectal exam 1+ B ankle edema  Lab Results  Component Value Date   WBC 5.6 08/26/2014   HGB 11.6* 08/26/2014   HCT 37.0* 08/26/2014   PLT 225 08/26/2014   GLUCOSE 248* 08/26/2014   CHOL 217* 06/17/2014   TRIG 98.0 06/17/2014   HDL 57.30 06/17/2014   LDLCALC 140* 06/17/2014   ALT 14 06/17/2014   AST 14 06/17/2014   NA 135 08/26/2014   K 4.3 08/26/2014   CL 104 08/26/2014   CREATININE 1.10 08/26/2014   BUN 20 08/26/2014   CO2 27 08/26/2014   TSH 1.81 07/17/2012   PSA 0.50 07/17/2012   INR 1.08 02/02/2011   HGBA1C 8.6* 08/25/2014   MICROALBUR 138.8 Verified by manual dilution.* 01/30/2014    Ct Renal Stone Study  08/24/2014  CLINICAL DATA:  Initial evaluation for acute onset severe right flank pain. EXAM: CT ABDOMEN AND PELVIS WITHOUT CONTRAST TECHNIQUE: Multidetector CT imaging of the abdomen and pelvis was performed following the standard protocol without IV contrast. COMPARISON:  Prior study from 06/14/2014. FINDINGS: Subsegmental  atelectasis seen dependently within the visualized lung bases. Visualized lungs are otherwise clear. The liver demonstrates a normal unenhanced appearance. Punctate calcification noted within the left hepatic lobe. Gallbladder within normal limits. No biliary dilatation. Spleen, adrenal glands, and pancreas are within normal limits. There is a in 8 mm nonobstructive stone within the upper pole left kidney. Additional punctate nonobstructive calculus present within the lower pole left kidney. No obstructive stone seen along the course of the left renal collecting system. There is no left-sided hydronephrosis or hydroureter. On the right, no radiopaque calculi identified within the right kidney. There is no did area mild right-sided hydronephrosis and hydroureter. Mild right periureteral fat stranding present. There is a very subtle punctate 3 mm stone at the right UVJ (series 510, image 95). No other radiopaque calculi seen along the course of the right renal collecting system.  Small hiatal hernia noted. Stomach otherwise unremarkable. No evidence for bowel obstruction. No acute inflammatory changes seen about the bowels. Minimal sigmoid diverticulosis present without evidence for diverticulitis. Appendix is normal. Small fat containing paraumbilical hernia noted. Bladder within normal limits.  Prostate unremarkable. No free air or fluid. No adenopathy. Mild scattered atheromatous plaque present about the intra-abdominal aorta. No aneurysm. No acute osseous abnormality. No worrisome lytic or blastic osseous lesions. IMPRESSION: 1. 3 mm obstructive stone at the right UVJ with secondary very mild right hydroureteronephrosis and mild right periureteral fat stranding. 2. 8 mm nonobstructive left renal calculus. 3. Very mild sigmoid diverticulosis without evidence for acute diverticulitis. 4. Small hiatal hernia. 5. Small fat containing paraumbilical hernia. Electronically Signed   By: Jeannine Boga M.D.   On:  08/24/2014 06:06    Assessment & Plan:   [name] [name] was seen today for annual exam.  Diagnoses and all orders for this visit:  Well adult exam -     Basic metabolic panel; Future -     CBC with Differential/Platelet; Future -     Hemoglobin A1c; Future -     Hepatic function panel; Future -     TSH; Future -     PSA; Future -     Urinalysis; Future -     Lipid panel; Future -     Hepatitis C antibody; Future  Uncontrolled type 2 diabetes mellitus with other circulatory complication, with long-term current use of insulin (HCC) -     Basic metabolic panel; Future -     Hemoglobin A1c; Future -     Hemoglobin A1c; Future  Essential hypertension  Obstructive sleep apnea -     Ambulatory referral to Pulmonology  Atherosclerosis of native coronary artery of native heart without angina pectoris  Need for prophylactic vaccination against Streptococcus pneumoniae (pneumococcus) -     Pneumococcal conjugate vaccine 13-valent IM  Other orders -     Insulin Glargine (LANTUS) 100 UNIT/ML Solostar Pen; Inject 130 Units into the skin every morning. -     Cancel: chlorpheniramine-HYDROcodone (TUSSIONEX PENNKINETIC ER) 10-8 MG/5ML SUER; Take 5 mLs by mouth every 12 (twelve) hours as needed for cough. -     Lorcaserin HCl (BELVIQ) 10 MG TABS; Take 1 tablet by mouth 2 (two) times [name]. -     Discontinue: chlorpheniramine-HYDROcodone (TUSSIONEX PENNKINETIC ER) 10-8 MG/5ML SUER; Take 5 mLs by mouth every 12 (twelve) hours as needed for cough. -     Insulin Degludec (TRESIBA FLEXTOUCH) 200 UNIT/ML SOPN; Inject 130 Units into the skin [name]. -     chlorpheniramine-HYDROcodone (TUSSIONEX PENNKINETIC ER) 10-8 MG/5ML SUER; Take 5 mLs by mouth every 12 (twelve) hours as needed for cough. -     Discontinue: azithromycin (ZITHROMAX) 250 MG tablet; As directed   I am having [name] [name] start on Insulin Degludec. I am also having him maintain his aspirin, rosuvastatin, dicyclomine, ondansetron,  tamsulosin, ketorolac, amLODipine, furosemide, metoprolol succinate, alfuzosin, losartan, B-D ULTRAFINE III SHORT PEN, cyclobenzaprine, diclofenac, CIALIS, Insulin Glargine, Lorcaserin HCl, and chlorpheniramine-HYDROcodone.  Meds ordered this encounter  Medications  . CIALIS 5 MG tablet    Sig: Take 1 tablet by mouth [name].  . Insulin Glargine (LANTUS) 100 UNIT/ML Solostar Pen    Sig: Inject 130 Units into the skin every morning.    Dispense:  135 mL    Refill:  3  . Lorcaserin HCl (BELVIQ) 10 MG TABS    Sig: Take 1 tablet by mouth 2 (two) times  [name].    Dispense:  60 tablet    Refill:  3  . DISCONTD: chlorpheniramine-HYDROcodone (TUSSIONEX PENNKINETIC ER) 10-8 MG/5ML SUER    Sig: Take 5 mLs by mouth every 12 (twelve) hours as needed for cough.    Dispense:  115 mL    Refill:  0  . Insulin Degludec (TRESIBA FLEXTOUCH) 200 UNIT/ML SOPN    Sig: Inject 130 Units into the skin [name].    Dispense:  60 mL    Refill:  11  . chlorpheniramine-HYDROcodone (TUSSIONEX PENNKINETIC ER) 10-8 MG/5ML SUER    Sig: Take 5 mLs by mouth every 12 (twelve) hours as needed for cough.    Dispense:  230 mL    Refill:  0  . DISCONTD: azithromycin (ZITHROMAX) 250 MG tablet    Sig: As directed    Dispense:  6 tablet    Refill:  0     Follow-up: Return in about 3 months (around 06/26/2015) for a follow-up visit.  Walker Kehr, MD

## 2015-03-27 ENCOUNTER — Telehealth: Payer: Self-pay

## 2015-03-27 NOTE — Telephone Encounter (Signed)
PA started via covermymeds. [id number]

## 2015-04-09 ENCOUNTER — Other Ambulatory Visit (INDEPENDENT_AMBULATORY_CARE_PROVIDER_SITE_OTHER): Payer: Managed Care, Other (non HMO)

## 2015-04-09 DIAGNOSIS — E1159 Type 2 diabetes mellitus with other circulatory complications: Secondary | ICD-10-CM | POA: Diagnosis not present

## 2015-04-09 DIAGNOSIS — E1165 Type 2 diabetes mellitus with hyperglycemia: Secondary | ICD-10-CM | POA: Diagnosis not present

## 2015-04-09 DIAGNOSIS — Z794 Long term (current) use of insulin: Secondary | ICD-10-CM

## 2015-04-09 DIAGNOSIS — Z0000 Encounter for general adult medical examination without abnormal findings: Secondary | ICD-10-CM

## 2015-04-09 DIAGNOSIS — IMO0002 Reserved for concepts with insufficient information to code with codable children: Secondary | ICD-10-CM

## 2015-04-09 LAB — CBC WITH DIFFERENTIAL/PLATELET
BASOS ABS: 0 10*3/uL (ref 0.0–0.1)
Basophils Relative: 0.4 % (ref 0.0–3.0)
EOS ABS: 0.3 10*3/uL (ref 0.0–0.7)
Eosinophils Relative: 4.3 % (ref 0.0–5.0)
HEMATOCRIT: 41 % (ref 39.0–52.0)
HEMOGLOBIN: 13.7 g/dL (ref 13.0–17.0)
LYMPHS PCT: 20.4 % (ref 12.0–46.0)
Lymphs Abs: 1.3 10*3/uL (ref 0.7–4.0)
MCHC: 33.5 g/dL (ref 30.0–36.0)
MCV: 78.7 fl (ref 78.0–100.0)
Monocytes Absolute: 0.3 10*3/uL (ref 0.1–1.0)
Monocytes Relative: 5.4 % (ref 3.0–12.0)
Neutro Abs: 4.5 10*3/uL (ref 1.4–7.7)
Neutrophils Relative %: 69.5 % (ref 43.0–77.0)
PLATELETS: 320 10*3/uL (ref 150.0–400.0)
RBC: 5.21 Mil/uL (ref 4.22–5.81)
RDW: 14.8 % (ref 11.5–15.5)
WBC: 6.5 10*3/uL (ref 4.0–10.5)

## 2015-04-09 LAB — HEPATIC FUNCTION PANEL
ALT: 26 U/L (ref 0–53)
AST: 16 U/L (ref 0–37)
Albumin: 3.4 g/dL — ABNORMAL LOW (ref 3.5–5.2)
Alkaline Phosphatase: 88 U/L (ref 39–117)
BILIRUBIN DIRECT: 0.1 mg/dL (ref 0.0–0.3)
BILIRUBIN TOTAL: 0.4 mg/dL (ref 0.2–1.2)
Total Protein: 7 g/dL (ref 6.0–8.3)

## 2015-04-09 LAB — BASIC METABOLIC PANEL
BUN: 21 mg/dL (ref 6–23)
CALCIUM: 8.9 mg/dL (ref 8.4–10.5)
CO2: 28 mEq/L (ref 19–32)
CREATININE: 1.05 mg/dL (ref 0.40–1.50)
Chloride: 101 mEq/L (ref 96–112)
GFR: 75.43 mL/min (ref >60.00)
Glucose, Bld: 271 mg/dL — ABNORMAL HIGH (ref 70–99)
Potassium: 4 mEq/L (ref 3.5–5.1)
Sodium: 136 mEq/L (ref 135–145)

## 2015-04-09 LAB — LIPID PANEL
CHOL/HDL RATIO: 5
Cholesterol: 204 mg/dL — ABNORMAL HIGH (ref 0–200)
HDL: 39.3 mg/dL (ref >39.00)
LDL CALC: 139 mg/dL — AB (ref 0–99)
NONHDL: 164.81
Triglycerides: 128 mg/dL (ref 0.0–149.0)
VLDL: 25.6 mg/dL (ref 0.0–40.0)

## 2015-04-09 LAB — URINALYSIS, ROUTINE W REFLEX MICROSCOPIC
Bilirubin Urine: NEGATIVE
LEUKOCYTES UA: NEGATIVE
Nitrite: NEGATIVE
PH: 6 (ref 5.0–8.0)
Urobilinogen, UA: 0.2 (ref 0.0–1.0)
WBC, UA: NONE SEEN (ref 0–?)

## 2015-04-09 LAB — PSA: PSA: 0.65 ng/mL (ref 0.10–4.00)

## 2015-04-09 LAB — TSH: TSH: 4.15 u[IU]/mL (ref 0.35–4.50)

## 2015-04-09 LAB — HEMOGLOBIN A1C: Hgb A1c MFr Bld: 8.7 % — ABNORMAL HIGH (ref 4.6–6.5)

## 2015-04-10 LAB — HEPATITIS C ANTIBODY: HCV Ab: NEGATIVE

## 2015-05-21 ENCOUNTER — Ambulatory Visit (INDEPENDENT_AMBULATORY_CARE_PROVIDER_SITE_OTHER)
Admission: RE | Admit: 2015-05-21 | Discharge: 2015-05-21 | Disposition: A | Discharge status: Home / Self Care | Payer: Managed Care, Other (non HMO) | Source: Ambulatory Visit | Attending: Pulmonary Disease | Admitting: Pulmonary Disease

## 2015-05-21 ENCOUNTER — Ambulatory Visit (INDEPENDENT_AMBULATORY_CARE_PROVIDER_SITE_OTHER): Payer: Managed Care, Other (non HMO) | Admitting: Pulmonary Disease

## 2015-05-21 ENCOUNTER — Encounter: Payer: Self-pay | Admitting: Pulmonary Disease

## 2015-05-21 VITALS — BP 142/82 | HR 75 | Temp 98.2°F | Ht 70.0 in | Wt 307.0 lb

## 2015-05-21 DIAGNOSIS — E669 Obesity, unspecified: Secondary | ICD-10-CM

## 2015-05-21 DIAGNOSIS — I517 Cardiomegaly: Secondary | ICD-10-CM | POA: Insufficient documentation

## 2015-05-21 DIAGNOSIS — G478 Other sleep disorders: Secondary | ICD-10-CM

## 2015-05-21 DIAGNOSIS — I272 Other secondary pulmonary hypertension: Secondary | ICD-10-CM

## 2015-05-21 DIAGNOSIS — I2729 Other secondary pulmonary hypertension: Secondary | ICD-10-CM

## 2015-05-21 DIAGNOSIS — E1165 Type 2 diabetes mellitus with hyperglycemia: Secondary | ICD-10-CM

## 2015-05-21 DIAGNOSIS — G4733 Obstructive sleep apnea (adult) (pediatric): Secondary | ICD-10-CM

## 2015-05-21 DIAGNOSIS — Z794 Long term (current) use of insulin: Secondary | ICD-10-CM

## 2015-05-21 DIAGNOSIS — I10 Essential (primary) hypertension: Secondary | ICD-10-CM

## 2015-05-21 NOTE — Patient Instructions (Signed)
Today we updated your med list in our EPIC system...    Continue your current medications the same...  Today we did a follow up CXR...    We will contact you w/ the results when available...   We will call [facility] regarding a new CPAP machine...    Let us know if there are any problems w/ this process...    We will request a "download" in 4-6 weeks & give you a call  Let's get on track w/ our diet/ exercise/ wt reduction strategies...  Dr Alain Marion may be able to adjust your BP meds to help decrease your edema...  Call for any questions or if we can be of service in any way.Marland KitchenMarland Kitchen

## 2015-05-21 NOTE — Progress Notes (Signed)
Subjective:    Patient ID: [name] [name], male    DOB: Sep 12, 1950, 65 y.o.   MRN: [id number]  HPI 65 y/o WM here for f/u of mult medical problems including OSA, HBP, DM w/ neuropathy, Obesity, & other problems listed below... he is a personal friend of [name] [name], [name] [name] & the brother-in-law of [name] [name].  ~  October 14, 2010:  433moROV & he feels that he is doing satis, but unfortunately has not lost any weight> he is again referred to the [facility] program & he will inquire... He tells me that his company pays for a health coach to check his meds & they scared him... We reviewed the indication for a follow up Cards eval due to mult risk factors... We reviewed labs & XRays from 11/11> f/u BS=176, A1c=7.1 (on Lantus, Actos+Met, & Glimep)... See prob list below>>  ~  July 07, 2011:  8-972moOV & CPX> see prob list below>>    He saw [name] [name] 10/12> he reviewed prev work up w/ 2DEcho, Myoview, Cath; he needs vigorous exercise program & wt reduction...    BP controlled on ToprolXL25, Norvasc10, Lisinopril40, & Lasix40; BP= 144/78 & he denies CP, palpit, dizzy, ch in SOB/DOE, edema, etc...    He has been off all statins & says he feels better> [name] [name] changed Simva40 to Prav40 due to Norvasc but pt just stopped them all 9/12 due to aching in his muscles; FLP on diet alone showed TChol 197, TG 174, HDL 42, LDL 121; he has agreed to try Crestor5m65m at this time...    His DM meds include: Lantus70Qam, Actos+Met 15-1000 Bid, Amaryl4mg47m, Onglyza5mg/61mBS=97, A1c=7.1; rec to continue same meds, get on diet, get weight down...    His weight is 284# which is down 5# over the last 33mo; 2mos not in favor of Bariatric surg & has declined to call [facility] for their intake session; we reviewed diet & exercise prescriptions...    He has seen [name] [name] in the past for Urology w/ Low-T, ED, & BPH; he is on TESTIM 5gm/d & Uroxatrol10mg/d65mstos level= 318 & PSA= 0.67; he notes that energy level & drive seem  ok to him therefore no changes made in his medication.  EKG 11/2010 showed NSR, rate82, wnl/ NAD...  2DEcho 11/2010 showed mod LVH, norm LVF w/ EF=60-65%, ?diastolc function?, norm AoV, trivial MR, mild LAdil at 47mm, R32m=42mmHg  41m9/24/12 showed mild cardiomegaly, sl incr markings at bases/ NAD, mild degen changes in Tspine...   LABS 2/13:  FLP- not at goals on diet alone;  Chems- ok w/ BS=97 A1c=7.1;  CBC- wnl;  TSH=2.59, PSA=0.67;  Testos=318  ~  July 19, 2012:  Yearly ROV & CPX>  [name] [name] feeling OK but has had a stressful yr- wife Dx & Rx for Breast cancer, he has been unable to lose wt & has gained 16# to 300# today w/ 2+pitting edema;  He tells me he stopped the Lasix40 due to excessive urination, and his BP is up to 170/88 today as a result...  We reviewed the following medical problems during today's office visit >>     OSA> on CPAP & followed by [name] [name]; he is 100% compliant & can't sleep w/o it; rests well, awakens at 4:30 every day- feels rested and denies daytime hypersomnolence etc; his CC= congestion & chest discomfort when he arises but this rapidly goes away when up & about...    HBP> on MeptopER25, Amlod10, Lisin40, Lasix40; known  [place] on 2DEcho 2012; admits to NOT taking the Lasix in months due to excess urination; in the meanwhile he has gained 16#, has VI & pitting edema, and BP= 170/88; we discussed the need for taking all meds every day, 2Gm Na diet, Lasix40 every day, & monitor BP at home...    CAD> on above + ASA81; known non-obstructive CAD on cath 2012; prev cardiac eval by [name] [name]- mult risk factors; he is too sedentary, denies CP/angina/ palpit/ ch in SOB; asked to join the Y or a gym for trainer to help w/ exercise program...    VI, Edema> we reviewed the critically important nature of NO SALT, elevation, support hose & take the Lasix40 every day...     Hyperlipid> on Crestor5; tolerating the low dose statin very well & FLP much improved over diet  alone; FLP 3/14 shows TChol 145, TG 77, HDL 47, LDL 83    DM-II> on Lantus70u, Actos+Met15-1000Bid, Glimep4Bid, Ongly5; labs 3/14 showed BS=139, A1c=7.3; Rec to continue same meds, get on diet, get wt down!    Obesity> wt=300# up 16# in the last yr (BMI~45); we reviewed diet, exercise, wt reduction strategies- he has been referred to [facility] for their Bariatric program but he tells me his wife refuses to let him consider this operation...    Divertics> known divertics & asymptomatic w/o abd pain, n/v, c/d, blood seen; he is due for f/u colonoscopy w/ [name] [name] & we will refer...    Hypogonad> he stopped his topical prep last yr; he has seen [name] [name]; notes that his energy is fair & drive is diminished; we decided to Rx w/ Androgel 1.62%- 2pumps & recheck level in 2-35mobefore considering incr dose or shots...    DDD- Cspine> aware, uses aleve as needed We reviewed prob list, meds, xrays and labs> see below for updates >>   LABS 3/14:  FLP- at goals on Cres5;  Chems- ok x BUN=27 (GFR is 92), BS=139 A1c=7.3;  CBC- wnl;  TSH=1.81;  PSA=0.50;  Testos=257;  Umicroalb=pos   ~  January 17, 2013:  677moOV & [name] [name] has still been unable to lose weight; c/o incr cost of meds & we made adjustment below; We reviewed the following medical problems during today's office visit >>     OSA> on CPAP & followed by [name] [name]; he is 100% compliant & can't sleep w/o it; rests well, awakens at 4:30 every day- feels rested and denies daytime hypersomnolence etc; he is overdue for Sleep f/u w/ [name] [name]...    ?Mild pulm HTN> 2DEcho in 2012 showed RVsys=4257m likely related to his OSA & obesity; as noted he is compliant w/ CPAP but not so much w/ diet/ exercise/ wt reduction...    HBP> on MeptopER25, Amlod10, Lisin40, Lasix40; [place]; he restarted his Lasix daily but has only lost 3#; BP= 138/62 & he denies CP, palpit, dizzy, ch in SOB/DOE, edema...    CAD> on above + ASA81; known non-obstructive CAD on cath  2012; prev cardiac eval by [name] [name]- mult risk factors; he is too sedentary, denies CP/angina/ palpit/ ch in SOB; asked to join the Y to help w/ exercise program...    VI, Edema> we reviewed the critically important nature of NO SALT, elevation, support hose & take the Lasix40 every day...     Hyperlipid> on Crestor5; tolerating the low dose statin very well & FLP much improved over diet alone; FLP 3/14 showed TChol 145, TG 77, HDL 47, LDL 83  DM-II> on Lantus70u, Actos+Met15-1000Bid, Glimep4Bid, Ongly5; labs 9/14 show BS=118, A1c=7.5; He needs to save $$- rec stop Actos+Met & switch to Metform1000Bid; must diet & get wt down!    Obesity> wt=296# (BMI~45); we reviewed diet, exercise, wt reduction strategies- he has been referred to [facility] for their Bariatric program but he tells me his wife refuses to let him consider this option.    Divertics> known divertics & asymptomatic w/o abd pain, n/v, c/d, blood seen; he is due for f/u colonoscopy w/ [name] [name] & we will refer...    Hypogonad> he stopped his topical prep again (never did ret for f/u Testos level on the pump); on Uroxatrol10 for bladder; he has seen [name] [name]; notes that his energy is OK & does not think that he is symptomatic enough to warrant therapy for this problem...    DDD- Cspine>  He had some left hip discomfort but he toughed it out w/ Aleve & Flexeril- now resolved... We reviewed prob list, meds, xrays and labs> see below for updates >> OK FLU vaccine today & he wants refill Tussionex...  LABS 9/14:  Chems- ok w/ BS=118 A1c=7.5.Marland KitchenMarland Kitchen   ~  May 21, 2015:  41moROV & pulmonary/ sleep recheck requested by PCP-DrPlotnikov>  Longtime [place] friend ([name] LandryMinor's brother-in-law) & patient, on CPAP >20 yrs & reports doing satis, DME=[employer], supplies Q3-485mo/ nasal mask, but needs new machine- current machine is old/ noisey/ etc and no download avail (he indicates 6-8H per night, wakes refreshed, denies daytime hypersomnolence, etc)... Current ESS  on CPAP is 4/24...    SEE MEDICAL PROBLEM LIST ABOVE...  EXAM shows Afeb, VSS, O2sat=96% on RA at rest, Wt=307#, 5'10"Tall, BMi=44;  HEENT- neg, mallampati4;  Chest- decr BS at bases w/o w/r/r;  Heart- RR w/o m/r/g;  Abd- obese, soft, nontender, neg;  Ext- w/o c/c/ +VI w/ edema...  LABS 03/2015>  Reviewed in epic>  BS=271, A1c=8.7, TSH=4.15... Note: Pt on LANTUS 130-150u daily per DrPlotnikov & DrEllison  CXR 05/21/15 showed borderline cardiomeg, sl incr markings NAD, Cspine surg changes...  IMP/PLAN >>  OSA w/ original sleep study 1995, on long-term CPAP; he is in need of new machine => order new ResMed S10, air/auto, start 5-15 but we can adjust based on preference, use climate controlled tubing, heated humidity, and mask of choice; he understands that he will need download in ~8wks for insurance purposes...          Problem List:  OBSTRUCTIVE SLEEP APNEA (ICD-327.23) - followed by [name] [name] & wears CPAP compliantly...  ~  he had a sleep study in 1995 (wt=235#) w/ 53 events/hr & desat to 83%- started on CPAP... ~  9/11:  f/u OV w/ [name] [name]  (wt=290#) and he ordered new CPAP machine for [name], last autotitrate showed optimal pressure= 13cmH2O. ~  3/14: on CPAP & followed by [name] [name]; he is 100% compliant & can't sleep w/o it; rests well, awakens at 4:30 every day- feels rested and denies daytime hypersomnolence etc; his CC= congestion & chest discomfort when he arises but this rapidly goes away when up & about.  HYPERTENSION (ICD-401.9) - on TOPROL XL 2580m, AMLODIPINE 65m46m LISINOPRIL 40mg73m& FUROSEMIDE 40mg/31m ~  6/12:  BP= 150/80 & similar at home; denies HA, visual changes, CP, palipit, dizziness, syncope, dyspnea, edema, etc; he knows to elim sodium from diet & lose weight to control BP better... ~  CXR 9/12 showed mild cardiomeg- stable, clear lungs, mild DJD in spine... ~  3/14: on MeptopER25, [place]mlod1[place]40, Lasix40;  known [place] on 2DEcho 2012; admits to NOT taking the Lasix  in months due to excess urination; in the meanwhile he has gained 16#, has VI & pitting edema, and BP= 170/88; we discussed the need for taking all meds every day, 2Gm Na diet, Lasix40 every day, & monitor BP at home. ~  9/14: on MeptopER25, Amlod10, Lisin40, Lasix40; known [place] on 2DEcho 2012; he restarted his Lasix daily but has only lost 3#; BP= 138/62 & he denies CP, palpit, dizzy, ch in SOB/DOE, edema.  CARDIAC screening eval & subsequent eval for dyspnea>> seen by [name] [name]... ~  Myoview 7/06 showed apical thinning, no ischemia, EF= 66% & normal wall motion. ~  EKG shows NSR, WNL... ~  2DEcho 7/12 showed incr wall thickness c/w modLVH, norm sys function w/ EF=60-65%, mild LAdil, PAsys~11mHg est.. ~  Myoview 7/12 showed only fair exerc capacity (verySOB), hypertensive BP response, no CP, no ST changes, normal perfusion & EF=60% ~  Subsequent cath 9/12 by DrArida showed nonobstructive CAD in LAD & RCA, normal LVF w/ EF=65%, LVEDP=22 but right heart pressures were WNL & no pulmHTN... ~  3/14: on above + ASA81; known non-obstructive CAD on cath 2012; prev cardiac eval by [name] [name]- mult risk factors; he is too sedentary, denies CP/angina/ palpit/ ch in SOB; asked to join the Y or a gym for trainer to help w/ exercise program.  VENOUS INSUFFICIENCY & EDEMA >> on low sodium diet & LASIX473md... ~  3/14:  we reviewed the critically important nature of NO SALT, elevation, support hose & take the Lasix40 every day.  HYPERLIPIDEMIA (ICD-272.4) - prev on Simva40 but it was changed to Prav40 9/12 due to Norvasc restriction; he decided on his own to stop all statin meds due to musc aching which improved off the med; f/u FLP 2/13 not at goals on diet alone & he agreed to start Crestor5m47m...  ~  FLP[place]/09 showed TChol 124, TG 108, HDL 39, LDL 64 ~  FLP 11/10 showed TChol 145, TG 76, HDL 43, LDL 86 ~  FLP 11/11 showed TChol 148, TG 91, HDL 47, LDL 83 ~  FLP 7/12 on Simva40 showed TChol 144, TG 83,  HDL 49, LDL 78 ~  9/12: [name] [name] switched him to Prav40, but he stopped all statins due to aching... ~  [place] on diet alone showed TChol 197, TG 174, HDL 42, LDL 121... He has agreed to take Cres5. ~  FLP 3/14 on Cres5 showed TChol 145, TG 77, HDL 47, LDL 83  DIABETES MELLITUS, TYPE II (ICD-250.00) - on LANTUS 70u daily,  ACTOS+MET 1000-15 Bid, GLIMEPIRIDE 4mg30m & ONGLYZA 5mg/2m.  ~  his Urine microalb has been +pos, w/ norm BUN & Creat... ~  labs 12/09 showed BS= 86, A1c= 8.2 ~  labs 11/10 showed BS= 162, A1c= 8.5 ~  labs 11/11 showed BS= 114, A1c= 6.8 ~  Labs 6/12 showed BS= 176, A1c= 7.1... We discussed the benefits to his DM from Bariatric surg. ~  Labs 2/13 on Lantus70+3meds32mowed BS= 97, A1c= 7.1... ConMarland KitchenMarland Kitchennue same, get on diet, get wt down, watch BS. ~  3/14: on Lantus70u, Actos+Met15-1000Bid, Glimep4Bid, Ongly5; labs 3/14 showed BS=139, A1c=7.3; Rec to continue same meds, get on diet, get wt down!  ~  9/14: on Lantus70u, Actos+Met15-1000Bid, Glimep4Bid, Ongly5; labs 9/14 show BS=118, A1c=7.5; He needs to save $$- rec stop Actos+Met & switch to Metform1000Bid; must diet & get wt down!  EXOGENOUS OBESITY (ICD-278.00) - his weight has been consistantly in  the 270-290# range x yrs... we discussed low carb/ low fat diet & exercise program needed to result in substantial weight loss... ~  weight 12/09 = 279#,  68" tall,  BMI= 42.5 ~  weight 11/10 = 276#, 68" tall,  BMI= 42 ~  weight 9/11 = 290#,  68" tall,  BMI= 44... we discussed Bariatric surgery. ~  weight 12/11 = 290# ~  Weight 6/12 = 289# and he is encouraged to call [facility] regarding their Bariatric program. ~  Weight 2/13 = 284# and he is not interested in Bariatric surg... ~  Weight 3/14 = 300# and he tells me that his wife won't let him consider Bariatric surg... ~  Weight 9/14 = 296#  DIVERTICULOSIS OF COLON (ICD-562.10) ~  Colonoscopy 3/03 by Demetra Shiner showed divertics, otherw neg... f/u planned 55yr. ~  CT scan of abd 1/04  showed diverticulosis... ~  [facility]14 & [facility]  [facility]  TESTICULAR HYPOFUNCTION (ICD-257.2) - he has prev been on Testim, but on & off medication due to insurance coverage problems... ~  labs 7/10 showed Testosterone level = 189 (350-890) ~  labs 12/10 showed Testosterone level = 231 ~  10/11:  eval by [name] [name] for Hypogonadism, ED, BPH> Rx w/ ANDROGEL 5gm/d, LEVITRA 272mPrn, UROXATROL 10110m... ~  11/11:  labs here showed Testosterone level = 327, PSA= 0.56 ~  Labs 2/13 showed Testos level = 318, PSA= 0.67 ~  Labs 3/14 off Testos replacement showed Testos level = 257, PSA= 0.50; Rec- restart ANDROGEL 1.62%- 2 pumps... ~  9/14:  He stopped Androgel on his own (again) & notes energy is good, feeling well, doesn't think he is symptomatic...  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  Hx of ANEMIA (ICD-285.9) - he takes MVI daily... ~  labs 12/09 (after CSpine surg) showed Hg= 10.9, MCV= 77 ~  labs 11/10 showed Hg= 12.3, MCV= 79 ~  labs 11/11 showed Hg= 12.6, MCV= 80 ~  Labs 2/13 showed Hg= 13.6, MCV= 80 ~  Labs 3/14 showed Hg= 13.0, MCV=79   Past Surgical History  Procedure Laterality Date  . Posterior laminectomy / decompression cervical spine      c5-6 fusion 12/08 Dr. HirshConsuello Masse/p cervical disc surgery and fusion  10/09    Dr. HirshConsuello Masseutpatient Encounter Prescriptions as of 05/21/2015  Medication Sig  . amLODipine (NORVASC) 10 MG tablet Take 1 tablet (10 mg total) by mouth daily.  . aspMarland Kitchenrin 81 MG tablet Take 81 mg by mouth daily.    . B-D ULTRAFINE III SHORT PEN 31G X 8 MM MISC every morning.  . chlorpheniramine-HYDROcodone (TUSSIONEX PENNKINETIC ER) 10-8 MG/5ML SUER Take 5 mLs by mouth every 12 (twelve) hours as  needed for cough.  . CIALIS 5 MG tablet Take 1 tablet by mouth daily.  . cyclobenzaprine (AMRIX) 15 MG 24 hr capsule Take 1 capsule (15 mg total) by mouth daily as needed for muscle spasms.  . diclofenac (VOLTAREN) 75 MG EC tablet Take 1 tablet (75 mg total) by mouth 2 (two) times daily.  . dicMarland Kitchenclomine (BENTYL) 20 MG tablet Take 1 tablet (20 mg total) by mouth every 6 (six) hours as needed for spasms (for abdominal cramping).  . furosemide (LASIX) 40 MG tablet Take  1 tablet (40 mg total) by mouth daily.  . Insulin Glargine (LANTUS) 100 UNIT/ML Solostar Pen Inject 130 Units into the skin every morning. (Patient taking differently: Inject 150 Units into the skin every morning. )  . ketorolac (TORADOL) 10 MG tablet Take 1 tablet (10 mg total) by mouth every 8 (eight) hours as needed for severe pain (kidney stone pain).  Marland Kitchen losartan (COZAAR) 100 MG tablet Take 1 tablet (100 mg total) by mouth daily.  . metoprolol succinate (TOPROL XL) 25 MG 24 hr tablet Take 1 tablet (25 mg total) by mouth daily.  . ondansetron (ZOFRAN) 4 MG tablet Take 1 tablet (4 mg total) by mouth every 6 (six) hours as needed for nausea.  . rosuvastatin (CRESTOR) 20 MG tablet Take 1 tablet (20 mg total) by mouth daily.  . tamsulosin (FLOMAX) 0.4 MG CAPS capsule Take 1 capsule (0.4 mg total) by mouth daily after supper. (Patient taking differently: Take 0.8 mg by mouth daily after supper. )  . [DISCONTINUED] alfuzosin (UROXATRAL) 10 MG 24 hr tablet Take 1 tablet (10 mg total) by mouth daily. (Patient not taking: Reported on 03/26/2015)  . [DISCONTINUED] azithromycin (ZITHROMAX) 250 MG tablet As directed  . [DISCONTINUED] Insulin Degludec (TRESIBA FLEXTOUCH) 200 UNIT/ML SOPN Inject 130 Units into the skin daily.  . [DISCONTINUED] Lorcaserin HCl (BELVIQ) 10 MG TABS Take 1 tablet by mouth 2 (two) times daily.   No facility-administered encounter medications on file as of 05/21/2015.    Allergies  Allergen Reactions  . Amoxicillin  Nausea And Vomiting  . Lisinopril     Cough   . Penicillins     Per pt: unknown  . Simvastatin     myalgia    Current Medications, Allergies, Past Medical History, Past Surgical History, Family History, and Social History were reviewed in [employer] record.   Review of Systems        The patient complains of fatigue, chest discomfort, dyspnea on exertion, and stiffness in joints.  The patient denies fever, chills, sweats, anorexia, weakness, malaise, weight loss, sleep disorder, blurring, diplopia, eye irritation, eye discharge, vision loss, eye pain, photophobia, earache, ear discharge, tinnitus, decreased hearing, nasal congestion, nosebleeds, sore throat, hoarseness, syncope, orthopnea, PND, peripheral edema, cough, dyspnea at rest, excessive sputum, hemoptysis, wheezing, pleurisy, nausea, vomiting, diarrhea, constipation, change in bowel habits, abdominal pain, melena, hematochezia, jaundice, gas/bloating, indigestion/heartburn, dysphagia, odynophagia, dysuria, hematuria, urinary frequency, urinary hesitancy, nocturia, incontinence, back pain, joint pain, joint swelling, muscle cramps, muscle weakness, arthritis, sciatica, restless legs, leg pain at night, leg pain with exertion, rash, itching, dryness, suspicious lesions, paralysis, paresthesias, seizures, tremors, vertigo, transient blindness, frequent falls, frequent headaches, difficulty walking, depression, anxiety, memory loss, confusion, cold intolerance, heat intolerance, polydipsia, polyphagia, polyuria, unusual weight change, abnormal bruising, bleeding, enlarged lymph nodes, urticaria, allergic rash, hay fever, and recurrent infections.    Objective:   Physical Exam     WD, Obese, 65 y/o WM in NAD... GENERAL:  Alert & oriented; pleasant & cooperative... HEENT:  [place]/AT, EOM-wnl, PERRLA, EACs-clear, TMs-wnl, NOSE-clear, THROAT-clear & wnl. NECK:  Supple w/ fairROM; no JVD; normal carotid impulses w/o bruits;  no thyromegaly or nodules palpated; no lymphadenopathy. Scar of his ant cerv disc surg... CHEST:  Clear to P & A; without wheezes/ rales/ or rhonchi. HEART:  Regular Rhythm; without murmurs/ rubs/ or gallops. ABDOMEN:  Obese, soft & nontender; normal bowel sounds; no organomegaly or masses detected. EXT: without deformities, mild arthritic changes; no varicose veins/ +venous insuffic &  2+pitting edema. NEURO:  CN's intact; motor testing normal; sensory testing normal; gait normal & balance OK. DERM:  No lesions noted; no rash etc...  RADIOLOGY DATA:  Reviewed in the EPIC EMR & discussed w/ the patient...  LABORATORY DATA:  Reviewed in the EPIC EMR & discussed w/ the patient...   Assessment & Plan:    OSA>  Followed by [name] [name]; he uses his CPAP nightly; denies daytime hypersomnolence etc... 05/21/15>   OSA w/ original sleep study 1995, on long-term CPAP; he is in need of new machine => order new ResMed S10, air/auto, start 5-15 but we can adjust based on preference, use climate controlled tubing, heated humidity, and mask of choice; he understands that he will need download in ~8wks for insurance purposes.   HBP>  Controlled on his 4 meds, & we stressed diet, exercise, no salt, wt reduction...  HYPERCHOLESTEROLEMIA>  Much improved & now at goal on Cres5, continue same...  DM>  On Lantus, Glimep, Actos+Met, Onglyza... Control is fair w/ A1c=7.5 & very weight dependent; he wants to save $$ therefore stop Actos+Met & use Metform1000Bid...  OBESITY>  Weight reduction is critical to his health & longevity... He has been rec & referred to Bariatric surg program at [facility] but he says his wife will not let him have this surg...  Divertics>  He is due for colonoscopy & we will refer...  Testic Hypofunction> Followed by [name] [name] for Urology; he stopped the hormone replacement Rx again & states that he is feeling fine...  Other medical issues as noted...   Patient's Medications  New  Prescriptions   No medications on file  Previous Medications   AMLODIPINE (NORVASC) 10 MG TABLET    Take 1 tablet (10 mg total) by mouth daily.   ASPIRIN 81 MG TABLET    Take 81 mg by mouth daily.     B-D ULTRAFINE III SHORT PEN 31G X 8 MM MISC    every morning.   CHLORPHENIRAMINE-HYDROCODONE (TUSSIONEX PENNKINETIC ER) 10-8 MG/5ML SUER    Take 5 mLs by mouth every 12 (twelve) hours as needed for cough.   CIALIS 5 MG TABLET    Take 1 tablet by mouth daily.   CYCLOBENZAPRINE (AMRIX) 15 MG 24 HR CAPSULE    Take 1 capsule (15 mg total) by mouth daily as needed for muscle spasms.   DICLOFENAC (VOLTAREN) 75 MG EC TABLET    Take 1 tablet (75 mg total) by mouth 2 (two) times daily.   DICYCLOMINE (BENTYL) 20 MG TABLET    Take 1 tablet (20 mg total) by mouth every 6 (six) hours as needed for spasms (for abdominal cramping).   FUROSEMIDE (LASIX) 40 MG TABLET    Take 1 tablet (40 mg total) by mouth daily.   INSULIN GLARGINE (LANTUS) 100 UNIT/ML SOLOSTAR PEN    Inject 130 Units into the skin every morning.   KETOROLAC (TORADOL) 10 MG TABLET    Take 1 tablet (10 mg total) by mouth every 8 (eight) hours as needed for severe pain (kidney stone pain).   LOSARTAN (COZAAR) 100 MG TABLET    Take 1 tablet (100 mg total) by mouth daily.   METOPROLOL SUCCINATE (TOPROL XL) 25 MG 24 HR TABLET    Take 1 tablet (25 mg total) by mouth daily.   ONDANSETRON (ZOFRAN) 4 MG TABLET    Take 1 tablet (4 mg total) by mouth every 6 (six) hours as needed for nausea.   ROSUVASTATIN (CRESTOR) 20 MG TABLET  Take 1 tablet (20 mg total) by mouth daily.   TAMSULOSIN (FLOMAX) 0.4 MG CAPS CAPSULE    Take 1 capsule (0.4 mg total) by mouth daily after supper.  Modified Medications   No medications on file  Discontinued Medications   ALFUZOSIN (UROXATRAL) 10 MG 24 HR TABLET    Take 1 tablet (10 mg total) by mouth daily.   AZITHROMYCIN (ZITHROMAX) 250 MG TABLET    As directed   INSULIN DEGLUDEC (TRESIBA FLEXTOUCH) 200 UNIT/ML SOPN    Inject  130 Units into the skin daily.   LORCASERIN HCL (BELVIQ) 10 MG TABS    Take 1 tablet by mouth 2 (two) times daily.

## 2015-05-26 ENCOUNTER — Telehealth: Payer: Self-pay | Admitting: Pulmonary Disease

## 2015-05-26 NOTE — Telephone Encounter (Signed)
Notes Recorded by Noralee Space, MD on 05/23/2015 at 9:08 AM Please notify patient>  CXR shows borderline cardiomeg, sl increased lung markings but clear & NAD, Cspine surg... No change from prev CXR 2012... ---  I spoke with patient about results and he verbalized understanding and had no questions.

## 2015-06-05 ENCOUNTER — Telehealth: Payer: Self-pay | Admitting: Pulmonary Disease

## 2015-06-05 DIAGNOSIS — G4733 Obstructive sleep apnea (adult) (pediatric): Secondary | ICD-10-CM

## 2015-06-05 NOTE — Telephone Encounter (Signed)
Attempted to contact pt. No answer, will try back later.

## 2015-06-06 NOTE — Telephone Encounter (Signed)
Pt needing CPAP pressure adjusted- states that the pressure seems too low.  Supposed to be set on 5-15 Auto with Ramp. Wearing a Full face mask. Pt having difficulty falling asleep during the RAMP setting, states that he is having trouble falling off to sleep and feels that it is because his RAMP setting is too low and feels that he would do better at something a little higher than 5. Pt would like the starting RAMP to be increased a little - currently on 5 DME is [facility]  Please advise Dr Lenna Gilford. Thanks.

## 2015-06-09 NOTE — Telephone Encounter (Signed)
Per SN: Pt can adjust the ramp settings on his CPAP, he can look at his instruction booklet or contact [employer] to help. If the pt is referring to the initial pressure being too low then he can contact [place] and let them know, then Huey Romans can contact us about adjusting his pressure settings. Thanks.

## 2015-06-09 NOTE — Telephone Encounter (Signed)
lmcb x1 for pt 

## 2015-06-10 NOTE — Telephone Encounter (Signed)
Per SN- Ok to increase pressure to auto 10-18 from 5-15. Will need to get a new download in 6 weeks.   Called and spoke to pt. Informed him of the recs per SN. Order placed. Pt verbalized understanding and denied any further questions or concerns at this time.

## 2015-06-10 NOTE — Telephone Encounter (Signed)
Spoke with pt, states he's already tried to increase his initial ramp pressure-was unable to change the initial pressure.  Pt had called [employer], who told him that the company would not be able to increase his starting pressure without an order from the provider, which is why the patient called [place].  Pt is requesting we increase his initial pressure as he cannot increase it himself and [employer] cannot increase it without an order from [place].    SN please advise.  Thanks!

## 2015-06-11 ENCOUNTER — Telehealth: Payer: Self-pay | Admitting: Pulmonary Disease

## 2015-06-11 NOTE — Telephone Encounter (Signed)
Received fax from [facility] requesting we fax pt's sleep study we have on file. Per SN's notes in pt's last OV, it states pt had sleep study in 1995. Printed sleep study that was scanned in dated 1995 and faxed to [place]. Nothing further needed at this time.

## 2015-06-23 ENCOUNTER — Emergency Department (HOSPITAL_COMMUNITY)
Admission: EM | Admit: 2015-06-23 | Discharge: 2015-06-23 | Disposition: A | Discharge status: Home / Self Care | Payer: Managed Care, Other (non HMO) | Attending: Emergency Medicine | Admitting: Emergency Medicine

## 2015-06-23 DIAGNOSIS — R112 Nausea with vomiting, unspecified: Secondary | ICD-10-CM | POA: Insufficient documentation

## 2015-06-23 DIAGNOSIS — E669 Obesity, unspecified: Secondary | ICD-10-CM | POA: Insufficient documentation

## 2015-06-23 DIAGNOSIS — E119 Type 2 diabetes mellitus without complications: Secondary | ICD-10-CM | POA: Insufficient documentation

## 2015-06-23 DIAGNOSIS — I10 Essential (primary) hypertension: Secondary | ICD-10-CM | POA: Diagnosis not present

## 2015-06-23 DIAGNOSIS — R109 Unspecified abdominal pain: Secondary | ICD-10-CM | POA: Diagnosis not present

## 2015-06-23 LAB — URINALYSIS, ROUTINE W REFLEX MICROSCOPIC
Bilirubin Urine: NEGATIVE
Ketones, ur: NEGATIVE mg/dL
LEUKOCYTES UA: NEGATIVE
Nitrite: NEGATIVE
Specific Gravity, Urine: 1.028 (ref 1.005–1.030)
pH: 5 (ref 5.0–8.0)

## 2015-06-23 LAB — URINE MICROSCOPIC-ADD ON

## 2015-06-23 NOTE — ED Notes (Addendum)
Patient reports right flank pain which began today.  States history of kidney stones.  Denies dysuria, hematuria, fevers.  Reports nausea, vomiting. Reports took Zofran, Toradol, Oxycodone he had at home at 1900 tonight.

## 2015-06-26 ENCOUNTER — Encounter: Payer: Self-pay | Admitting: Internal Medicine

## 2015-06-26 ENCOUNTER — Ambulatory Visit (INDEPENDENT_AMBULATORY_CARE_PROVIDER_SITE_OTHER): Payer: Managed Care, Other (non HMO) | Admitting: Internal Medicine

## 2015-06-26 VITALS — BP 168/84 | HR 88 | Wt 297.0 lb

## 2015-06-26 DIAGNOSIS — M5431 Sciatica, right side: Secondary | ICD-10-CM | POA: Diagnosis not present

## 2015-06-26 DIAGNOSIS — E1165 Type 2 diabetes mellitus with hyperglycemia: Secondary | ICD-10-CM | POA: Diagnosis not present

## 2015-06-26 DIAGNOSIS — K5901 Slow transit constipation: Secondary | ICD-10-CM

## 2015-06-26 DIAGNOSIS — K5900 Constipation, unspecified: Secondary | ICD-10-CM | POA: Insufficient documentation

## 2015-06-26 DIAGNOSIS — Z794 Long term (current) use of insulin: Secondary | ICD-10-CM

## 2015-06-26 MED ORDER — PREDNISONE 10 MG PO TABS: ORAL_TABLET | ORAL | Status: DC

## 2015-06-26 MED ORDER — LUBIPROSTONE 24 MCG PO CAPS: 24.0000 ug | ORAL_CAPSULE | Freq: Two times a day (BID) | ORAL | Status: DC

## 2015-06-26 MED ORDER — KETOROLAC TROMETHAMINE 60 MG/2ML IM SOLN
60.0000 mg | Freq: Once | INTRAMUSCULAR | Status: AC
  Administered 2015-06-26: 60 mg via INTRAMUSCULAR

## 2015-06-26 MED ORDER — ACYCLOVIR 800 MG PO TABS: 800.0000 mg | ORAL_TABLET | Freq: Every day | ORAL | Status: DC

## 2015-06-26 MED ORDER — LINACLOTIDE 145 MCG PO CAPS: 145.0000 ug | ORAL_CAPSULE | Freq: Every day | ORAL | Status: DC | PRN

## 2015-06-26 NOTE — Assessment & Plan Note (Signed)
Lantus

## 2015-06-26 NOTE — Progress Notes (Signed)
Subjective:  Patient ID: [name] [name], male    DOB: [birth year]  Age: 65 y.o. MRN: [id number]  CC: No chief complaint on file.   HPI [name] [name] presents for R flank pain since Sunday. Pt went to RE and left. Pt saw [facility] PA on Tue - no stone. Ketorolac helped. Percocet is not helping. Flexeril did not help. Pain shoots in the butt - worse w/ breathing 9/10 in intensity. Worse w/walking, sitting. C/o constipation  Outpatient Prescriptions Prior to Visit  Medication Sig Dispense Refill  . amLODipine (NORVASC) 10 MG tablet Take 1 tablet (10 mg total) by mouth daily. 90 tablet 3  . aspirin 81 MG tablet Take 81 mg by mouth daily.      . B-D ULTRAFINE III SHORT PEN 31G X 8 MM MISC every morning.    . chlorpheniramine-HYDROcodone (TUSSIONEX PENNKINETIC ER) 10-8 MG/5ML SUER Take 5 mLs by mouth every 12 (twelve) hours as needed for cough. 230 mL 0  . CIALIS 5 MG tablet Take 1 tablet by mouth daily.    . cyclobenzaprine (AMRIX) 15 MG 24 hr capsule Take 1 capsule (15 mg total) by mouth daily as needed for muscle spasms. 30 capsule 0  . diclofenac (VOLTAREN) 75 MG EC tablet Take 1 tablet (75 mg total) by mouth 2 (two) times daily. 30 tablet 2  . dicyclomine (BENTYL) 20 MG tablet Take 1 tablet (20 mg total) by mouth every 6 (six) hours as needed for spasms (for abdominal cramping). 20 tablet 0  . furosemide (LASIX) 40 MG tablet Take 1 tablet (40 mg total) by mouth daily. 90 tablet 3  . Insulin Glargine (LANTUS) 100 UNIT/ML Solostar Pen Inject 130 Units into the skin every morning. (Patient taking differently: Inject 150 Units into the skin every morning. ) 135 mL 3  . ketorolac (TORADOL) 10 MG tablet Take 1 tablet (10 mg total) by mouth every 8 (eight) hours as needed for severe pain (kidney stone pain). 12 tablet 0  . losartan (COZAAR) 100 MG tablet Take 1 tablet (100 mg total) by mouth daily. 90 tablet 3  . metoprolol succinate (TOPROL XL) 25 MG 24 hr tablet Take 1 tablet (25 mg total) by  mouth daily. 90 tablet 3  . ondansetron (ZOFRAN) 4 MG tablet Take 1 tablet (4 mg total) by mouth every 6 (six) hours as needed for nausea. 65 tablet 0  . rosuvastatin (CRESTOR) 20 MG tablet Take 1 tablet (20 mg total) by mouth daily. 90 tablet 3  . tamsulosin (FLOMAX) 0.4 MG CAPS capsule Take 1 capsule (0.4 mg total) by mouth daily after supper. (Patient taking differently: Take 0.8 mg by mouth daily after supper. ) 90 capsule 3   No facility-administered medications prior to visit.    ROS Review of Systems  Constitutional: Positive for fatigue. Negative for fever, chills, appetite change and unexpected weight change.  HENT: Negative for congestion, nosebleeds, sneezing, sore throat and trouble swallowing.   Eyes: Negative for itching and visual disturbance.  Respiratory: Negative for cough.   Cardiovascular: Negative for chest pain, palpitations and leg swelling.  Gastrointestinal: Positive for nausea. Negative for vomiting, diarrhea, blood in stool and abdominal distention.  Genitourinary: Negative for urgency, frequency, hematuria and decreased urine volume.  Musculoskeletal: Positive for back pain. Negative for joint swelling, gait problem and neck pain.  Skin: Negative for rash.  Neurological: Negative for dizziness, tremors, speech difficulty and weakness.  Psychiatric/Behavioral: Negative for sleep disturbance, dysphoric mood and agitation. The patient is not nervous/anxious.  Objective:  BP 168/84 mmHg  Pulse 88  Wt 297 lb (134.718 kg)  SpO2 92%  BP Readings from Last 3 Encounters:  06/26/15 168/84  06/23/15 200/84  05/21/15 142/82    Wt Readings from Last 3 Encounters:  06/26/15 297 lb (134.718 kg)  05/21/15 307 lb (139.254 kg)  03/26/15 308 lb (139.708 kg)    Physical Exam  Constitutional: He is oriented to person, place, and time. He appears well-developed. No distress.  NAD  HENT:  Mouth/Throat: Oropharynx is clear and moist.  Eyes: Conjunctivae are  normal. Pupils are equal, round, and reactive to light.  Neck: Normal range of motion. No JVD present. No thyromegaly present.  Cardiovascular: Normal rate, regular rhythm, normal heart sounds and intact distal pulses.  Exam reveals no gallop and no friction rub.   No murmur heard. Pulmonary/Chest: Effort normal and breath sounds normal. No respiratory distress. He has no wheezes. He has no rales. He exhibits no tenderness.  Abdominal: Soft. Bowel sounds are normal. He exhibits no distension and no mass. There is no tenderness. There is no rebound and no guarding.  Musculoskeletal: Normal range of motion. He exhibits tenderness. He exhibits no edema.  Lymphadenopathy:    He has no cervical adenopathy.  Neurological: He is alert and oriented to person, place, and time. He has normal reflexes. No cranial nerve deficit. He exhibits normal muscle tone. He displays a negative Romberg sign. Coordination and gait normal.  Skin: Skin is warm and dry. No rash noted.  Psychiatric: He has a normal mood and affect. His behavior is normal. Judgment and thought content normal.  R LS, R buttock is tender Str leg (+/-) - pt is unable to get up on the table  Lab Results  Component Value Date   WBC 6.5 04/09/2015   HGB 13.7 04/09/2015   HCT 41.0 04/09/2015   PLT 320.0 04/09/2015   GLUCOSE 271* 04/09/2015   CHOL 204* 04/09/2015   TRIG 128.0 04/09/2015   HDL 39.30 04/09/2015   LDLCALC 139* 04/09/2015   ALT 26 04/09/2015   AST 16 04/09/2015   NA 136 04/09/2015   K 4.0 04/09/2015   CL 101 04/09/2015   CREATININE 1.05 04/09/2015   BUN 21 04/09/2015   CO2 28 04/09/2015   TSH 4.15 04/09/2015   PSA 0.65 04/09/2015   INR 1.08 02/02/2011   HGBA1C 8.7* 04/09/2015   MICROALBUR 138.8 Verified by manual dilution.* 01/30/2014    No results found.  Assessment & Plan:   Diagnoses and all orders for this visit:  Sciatic nerve pain, right -     MR Lumbar Spine Wo Contrast; Future -     ketorolac  (TORADOL) injection 60 mg; Inject 2 mLs (60 mg total) into the muscle once.  Slow transit constipation  Uncontrolled type 2 diabetes mellitus with hyperglycemia, with long-term current use of insulin (HCC)  Other orders -     acyclovir (ZOVIRAX) 800 MG tablet; Take 1 tablet (800 mg total) by mouth 5 (five) times daily. -     Discontinue: predniSONE (DELTASONE) 10 MG tablet; Prednisone 10 mg: take 4 tabs a day x 3 days; then 3 tabs a day x 4 days; then 2 tabs a day x 4 days, then 1 tab a day x 6 days, then stop. Take pc. -     Discontinue: Linaclotide (LINZESS) 145 MCG CAPS capsule; Take 1-2 capsules (145-290 mcg total) by mouth daily as needed. -     predniSONE (DELTASONE) 10 MG  tablet; Prednisone 10 mg: take 4 tabs a day x 3 days; then 3 tabs a day x 4 days; then 2 tabs a day x 4 days, then 1 tab a day x 6 days, then stop. Take pc. -     lubiprostone (AMITIZA) 24 MCG capsule; Take 1 capsule (24 mcg total) by mouth 2 (two) times daily with a meal.  I have discontinued Mr. Janicki's Linaclotide. I am also having him start on acyclovir and lubiprostone. Additionally, I am having him maintain his aspirin, rosuvastatin, dicyclomine, ondansetron, tamsulosin, ketorolac, amLODipine, furosemide, metoprolol succinate, losartan, B-D ULTRAFINE III SHORT PEN, cyclobenzaprine, diclofenac, CIALIS, Insulin Glargine, chlorpheniramine-HYDROcodone, and predniSONE. We administered ketorolac.  Meds ordered this encounter  Medications  . acyclovir (ZOVIRAX) 800 MG tablet    Sig: Take 1 tablet (800 mg total) by mouth 5 (five) times daily.    Dispense:  50 tablet    Refill:  0  . DISCONTD: predniSONE (DELTASONE) 10 MG tablet    Sig: Prednisone 10 mg: take 4 tabs a day x 3 days; then 3 tabs a day x 4 days; then 2 tabs a day x 4 days, then 1 tab a day x 6 days, then stop. Take pc.    Dispense:  38 tablet    Refill:  1  . DISCONTD: Linaclotide (LINZESS) 145 MCG CAPS capsule    Sig: Take 1-2 capsules (145-290 mcg  total) by mouth daily as needed.    Dispense:  30 capsule    Refill:  3  . predniSONE (DELTASONE) 10 MG tablet    Sig: Prednisone 10 mg: take 4 tabs a day x 3 days; then 3 tabs a day x 4 days; then 2 tabs a day x 4 days, then 1 tab a day x 6 days, then stop. Take pc.    Dispense:  38 tablet    Refill:  1  . lubiprostone (AMITIZA) 24 MCG capsule    Sig: Take 1 capsule (24 mcg total) by mouth 2 (two) times daily with a meal.    Dispense:  60 capsule    Refill:  11  . ketorolac (TORADOL) injection 60 mg    Sig:      Follow-up: Return in about 1 week (around 07/03/2015) for a follow-up visit.  Walker Kehr, MD

## 2015-06-26 NOTE — Progress Notes (Signed)
Pre visit review using our clinic review tool, if applicable. No additional management support is needed unless otherwise documented below in the visit note. 

## 2015-06-26 NOTE — Assessment & Plan Note (Signed)
2/17 R buttock/thigh IM Toradol 60 MRI LS Percocet prn Prednisone 10 mg: take 4 tabs a day x 3 days; then 3 tabs a day x 4 days; then 2 tabs a day x 4 days, then 1 tab a day x 6 days, then stop. Take pc. Acyclovir if rash

## 2015-06-26 NOTE — Assessment & Plan Note (Addendum)
Amitiza  

## 2015-07-02 ENCOUNTER — Encounter: Payer: Self-pay | Admitting: Internal Medicine

## 2015-07-02 ENCOUNTER — Ambulatory Visit (INDEPENDENT_AMBULATORY_CARE_PROVIDER_SITE_OTHER): Payer: Managed Care, Other (non HMO) | Admitting: Internal Medicine

## 2015-07-02 VITALS — BP 170/80 | HR 88 | Wt 298.0 lb

## 2015-07-02 DIAGNOSIS — R109 Unspecified abdominal pain: Secondary | ICD-10-CM

## 2015-07-02 DIAGNOSIS — M5431 Sciatica, right side: Secondary | ICD-10-CM | POA: Diagnosis not present

## 2015-07-02 MED ORDER — KETOROLAC TROMETHAMINE 10 MG PO TABS: 10.0000 mg | ORAL_TABLET | Freq: Three times a day (TID) | ORAL | Status: DC | PRN

## 2015-07-02 MED ORDER — HYDROCODONE-ACETAMINOPHEN 10-325 MG PO TABS: 1.0000 | ORAL_TABLET | Freq: Three times a day (TID) | ORAL | Status: DC | PRN

## 2015-07-02 MED ORDER — INSULIN GLARGINE 100 UNIT/ML SOLOSTAR PEN: 150.0000 [IU] | PEN_INJECTOR | SUBCUTANEOUS | Status: DC

## 2015-07-02 MED ORDER — INSULIN GLARGINE 100 UNIT/ML SOLOSTAR PEN: 130.0000 [IU] | PEN_INJECTOR | SUBCUTANEOUS | Status: DC

## 2015-07-02 MED ORDER — BD PEN NEEDLE SHORT U/F 31G X 8 MM MISC: Status: DC

## 2015-07-02 NOTE — Progress Notes (Signed)
Subjective:  Patient ID: [name] [name], male    DOB: 09/19/1950  Age: 65 y.o. MRN: [id number]  CC: No chief complaint on file.   HPI [name] [name] presents for R sciatica pain - better today for the first time - it was bad all week. Toradol helps the best w/pain control. Norco 5 mg - not helping  Leaving for [place] on 3/7 - can he go?  Outpatient Prescriptions Prior to Visit  Medication Sig Dispense Refill  . acyclovir (ZOVIRAX) 800 MG tablet Take 1 tablet (800 mg total) by mouth 5 (five) times daily. 50 tablet 0  . amLODipine (NORVASC) 10 MG tablet Take 1 tablet (10 mg total) by mouth daily. 90 tablet 3  . aspirin 81 MG tablet Take 81 mg by mouth daily.      Marland Kitchen CIALIS 5 MG tablet Take 1 tablet by mouth daily.    . cyclobenzaprine (AMRIX) 15 MG 24 hr capsule Take 1 capsule (15 mg total) by mouth daily as needed for muscle spasms. 30 capsule 0  . diclofenac (VOLTAREN) 75 MG EC tablet Take 1 tablet (75 mg total) by mouth 2 (two) times daily. 30 tablet 2  . dicyclomine (BENTYL) 20 MG tablet Take 1 tablet (20 mg total) by mouth every 6 (six) hours as needed for spasms (for abdominal cramping). 20 tablet 0  . furosemide (LASIX) 40 MG tablet Take 1 tablet (40 mg total) by mouth daily. 90 tablet 3  . losartan (COZAAR) 100 MG tablet Take 1 tablet (100 mg total) by mouth daily. 90 tablet 3  . lubiprostone (AMITIZA) 24 MCG capsule Take 1 capsule (24 mcg total) by mouth 2 (two) times daily with a meal. 60 capsule 11  . metoprolol succinate (TOPROL XL) 25 MG 24 hr tablet Take 1 tablet (25 mg total) by mouth daily. 90 tablet 3  . ondansetron (ZOFRAN) 4 MG tablet Take 1 tablet (4 mg total) by mouth every 6 (six) hours as needed for nausea. 65 tablet 0  . predniSONE (DELTASONE) 10 MG tablet Prednisone 10 mg: take 4 tabs a day x 3 days; then 3 tabs a day x 4 days; then 2 tabs a day x 4 days, then 1 tab a day x 6 days, then stop. Take pc. 38 tablet 1  . rosuvastatin (CRESTOR) 20 MG tablet Take 1 tablet (20  mg total) by mouth daily. 90 tablet 3  . tamsulosin (FLOMAX) 0.4 MG CAPS capsule Take 1 capsule (0.4 mg total) by mouth daily after supper. (Patient taking differently: Take 0.8 mg by mouth daily after supper. ) 90 capsule 3  . B-D ULTRAFINE III SHORT PEN 31G X 8 MM MISC every morning.    . chlorpheniramine-HYDROcodone (TUSSIONEX PENNKINETIC ER) 10-8 MG/5ML SUER Take 5 mLs by mouth every 12 (twelve) hours as needed for cough. 230 mL 0  . Insulin Glargine (LANTUS) 100 UNIT/ML Solostar Pen Inject 130 Units into the skin every morning. (Patient taking differently: Inject 150 Units into the skin every morning. ) 135 mL 3  . ketorolac (TORADOL) 10 MG tablet Take 1 tablet (10 mg total) by mouth every 8 (eight) hours as needed for severe pain (kidney stone pain). 12 tablet 0   No facility-administered medications prior to visit.    ROS Review of Systems  Constitutional: Negative for appetite change, fatigue and unexpected weight change.  HENT: Negative for congestion, nosebleeds, sneezing, sore throat and trouble swallowing.   Eyes: Negative for itching and visual disturbance.  Respiratory: Negative for  cough.   Cardiovascular: Negative for chest pain, palpitations and leg swelling.  Gastrointestinal: Negative for nausea, diarrhea, blood in stool and abdominal distention.  Genitourinary: Negative for frequency and hematuria.  Musculoskeletal: Positive for back pain and gait problem. Negative for joint swelling and neck pain.  Skin: Negative for rash.  Neurological: Negative for dizziness, tremors, speech difficulty and weakness.  Psychiatric/Behavioral: Negative for sleep disturbance, dysphoric mood and agitation. The patient is not nervous/anxious.     Objective:  BP 170/80 mmHg  Pulse 88  Wt 298 lb (135.172 kg)  SpO2 95%  BP Readings from Last 3 Encounters:  07/02/15 170/80  06/26/15 168/84  06/23/15 200/84    Wt Readings from Last 3 Encounters:  07/02/15 298 lb (135.172 kg)    06/26/15 297 lb (134.718 kg)  05/21/15 307 lb (139.254 kg)    Physical Exam  Constitutional: He is oriented to person, place, and time. He appears well-developed and well-nourished. No distress.  HENT:  Head: Normocephalic and atraumatic.  Right Ear: External ear normal.  Left Ear: External ear normal.  Nose: Nose normal.  Mouth/Throat: Oropharynx is clear and moist. No oropharyngeal exudate.  Eyes: Conjunctivae and EOM are normal. Pupils are equal, round, and reactive to light. Right eye exhibits no discharge. Left eye exhibits no discharge. No scleral icterus.  Neck: Normal range of motion. Neck supple. No JVD present. No tracheal deviation present. No thyromegaly present.  Cardiovascular: Normal rate, regular rhythm, normal heart sounds and intact distal pulses.  Exam reveals no gallop and no friction rub.   No murmur heard. Pulmonary/Chest: Effort normal and breath sounds normal. No stridor. No respiratory distress. He has no wheezes. He has no rales. He exhibits no tenderness.  Abdominal: Soft. Bowel sounds are normal. He exhibits no distension and no mass. There is no tenderness. There is no rebound and no guarding.  Genitourinary: Rectum normal, prostate normal and penis normal. Guaiac negative stool. No penile tenderness.  Musculoskeletal: Normal range of motion. He exhibits tenderness. He exhibits no edema.  Lymphadenopathy:    He has no cervical adenopathy.  Neurological: He is alert and oriented to person, place, and time. He has normal reflexes. No cranial nerve deficit. He exhibits normal muscle tone. Coordination normal.  Skin: Skin is warm and dry. No rash noted. He is not diaphoretic. No erythema. No pallor.  Psychiatric: He has a normal mood and affect. His behavior is normal. Judgment and thought content normal.  LS tender on R  Lab Results  Component Value Date   WBC 6.5 04/09/2015   HGB 13.7 04/09/2015   HCT 41.0 04/09/2015   PLT 320.0 04/09/2015   GLUCOSE 271*  04/09/2015   CHOL 204* 04/09/2015   TRIG 128.0 04/09/2015   HDL 39.30 04/09/2015   LDLCALC 139* 04/09/2015   ALT 26 04/09/2015   AST 16 04/09/2015   NA 136 04/09/2015   K 4.0 04/09/2015   CL 101 04/09/2015   CREATININE 1.05 04/09/2015   BUN 21 04/09/2015   CO2 28 04/09/2015   TSH 4.15 04/09/2015   PSA 0.65 04/09/2015   INR 1.08 02/02/2011   HGBA1C 8.7* 04/09/2015   MICROALBUR 138.8 Verified by manual dilution.* 01/30/2014    No results found.  Assessment & Plan:   Diagnoses and all orders for this visit:  Right flank pain  Sciatic nerve pain, right  Other orders -     Insulin Glargine (LANTUS) 100 UNIT/ML Solostar Pen; Inject 130 Units into the skin every morning. -  B-D ULTRAFINE III SHORT PEN 31G X 8 MM MISC; As directed -     ketorolac (TORADOL) 10 MG tablet; Take 1 tablet (10 mg total) by mouth every 8 (eight) hours as needed for severe pain (kidney stone pain). -     HYDROcodone-acetaminophen (NORCO) 10-325 MG tablet; Take 1 tablet by mouth every 8 (eight) hours as needed for severe pain.   I have discontinued Mr. Keatley's chlorpheniramine-HYDROcodone. I have also changed his B-D ULTRAFINE III SHORT PEN. Additionally, I am having him start on HYDROcodone-acetaminophen. Lastly, I am having him maintain his aspirin, rosuvastatin, dicyclomine, ondansetron, tamsulosin, amLODipine, furosemide, metoprolol succinate, losartan, cyclobenzaprine, diclofenac, CIALIS, acyclovir, predniSONE, lubiprostone, Insulin Glargine, and ketorolac.  Meds ordered this encounter  Medications  . Insulin Glargine (LANTUS) 100 UNIT/ML Solostar Pen    Sig: Inject 130 Units into the skin every morning.    Dispense:  135 mL    Refill:  3  . B-D ULTRAFINE III SHORT PEN 31G X 8 MM MISC    Sig: As directed    Dispense:  100 each    Refill:  3  . ketorolac (TORADOL) 10 MG tablet    Sig: Take 1 tablet (10 mg total) by mouth every 8 (eight) hours as needed for severe pain (kidney stone pain).     Dispense:  30 tablet    Refill:  0  . HYDROcodone-acetaminophen (NORCO) 10-325 MG tablet    Sig: Take 1 tablet by mouth every 8 (eight) hours as needed for severe pain.    Dispense:  90 tablet    Refill:  0     Follow-up: Return in about 6 weeks (around 08/13/2015) for a follow-up visit.  Walker Kehr, MD

## 2015-07-02 NOTE — Assessment & Plan Note (Addendum)
MRI pending Toradol helps the best w/pain control Overall better today R sciatica pain - better today for the first time - it was bad all week. Toradol helps the best w/pain control. Norco 5 mg - not helping- changed to 10 mg Norco  Potential benefits of opioids use as well as potential risks (i.e. addiction risk, apnea etc) and complications (i.e. Somnolence, constipation and others) were explained to the patient and were aknowledged.

## 2015-07-02 NOTE — Progress Notes (Signed)
Pre visit review using our clinic review tool, if applicable. No additional management support is needed unless otherwise documented below in the visit note. 

## 2015-07-02 NOTE — Assessment & Plan Note (Addendum)
LS MRI - pending Toradol prn Finish Prednisone taper

## 2015-07-02 NOTE — Addendum Note (Signed)
Addended by: Cresenciano Lick on: 07/02/2015 11:35 AM   Modules accepted: Orders

## 2015-07-09 ENCOUNTER — Telehealth: Payer: Self-pay | Admitting: Internal Medicine

## 2015-07-09 ENCOUNTER — Other Ambulatory Visit: Payer: Self-pay | Admitting: *Deleted

## 2015-07-09 NOTE — Telephone Encounter (Signed)
Ok Thx 

## 2015-07-09 NOTE — Telephone Encounter (Signed)
Patient called regarding predniSONE (DELTASONE) 10 MG tablet [id number] . He is out of medication today. He is awaiting MRI approval, but asks for another round to be sent to [facility] on file.

## 2015-07-10 ENCOUNTER — Other Ambulatory Visit: Payer: Self-pay | Admitting: *Deleted

## 2015-07-10 MED ORDER — PREDNISONE 10 MG PO TABS: ORAL_TABLET | ORAL | Status: DC

## 2015-07-10 NOTE — Telephone Encounter (Signed)
Rf phoned in to [facility]. See meds.

## 2015-07-10 NOTE — Telephone Encounter (Signed)
stacey to phone in to [facility]. Spoke to patient to advise.

## 2015-07-25 ENCOUNTER — Ambulatory Visit (INDEPENDENT_AMBULATORY_CARE_PROVIDER_SITE_OTHER): Payer: Managed Care, Other (non HMO) | Admitting: Family

## 2015-07-25 ENCOUNTER — Ambulatory Visit (INDEPENDENT_AMBULATORY_CARE_PROVIDER_SITE_OTHER)
Admission: RE | Admit: 2015-07-25 | Discharge: 2015-07-25 | Disposition: A | Discharge status: Home / Self Care | Payer: Managed Care, Other (non HMO) | Source: Ambulatory Visit | Attending: Family | Admitting: Family

## 2015-07-25 ENCOUNTER — Encounter: Payer: Self-pay | Admitting: Family

## 2015-07-25 VITALS — BP 146/72 | HR 90 | Temp 98.0°F | Resp 18 | Ht 70.0 in | Wt 303.0 lb

## 2015-07-25 DIAGNOSIS — M79603 Pain in arm, unspecified: Secondary | ICD-10-CM | POA: Insufficient documentation

## 2015-07-25 DIAGNOSIS — M79602 Pain in left arm: Secondary | ICD-10-CM

## 2015-07-25 MED ORDER — KETOROLAC TROMETHAMINE 60 MG/2ML IM SOLN
60.0000 mg | Freq: Once | INTRAMUSCULAR | Status: AC
  Administered 2015-07-25: 60 mg via INTRAMUSCULAR

## 2015-07-25 MED ORDER — DICLOFENAC SODIUM 2 % TD SOLN: 1.0000 "application " | Freq: Two times a day (BID) | TRANSDERMAL | Status: DC | PRN

## 2015-07-25 MED ORDER — METHYLPREDNISOLONE ACETATE 80 MG/ML IJ SUSP
80.0000 mg | Freq: Once | INTRAMUSCULAR | Status: AC
  Administered 2015-07-25: 80 mg via INTRAMUSCULAR

## 2015-07-25 MED ORDER — GABAPENTIN 100 MG PO CAPS: ORAL_CAPSULE | ORAL | Status: DC

## 2015-07-25 NOTE — Progress Notes (Signed)
Pre visit review using our clinic review tool, if applicable. No additional management support is needed unless otherwise documented below in the visit note. 

## 2015-07-25 NOTE — Patient Instructions (Addendum)
Thank you for choosing [facility].  Summary/Instructions:  Ice 2-3 times per day and after activity. Pennsaid - 2x daily on neck/shoulder about pinkie sized dose. Gabapentin nightly and increase after 3-4 days if needed  Exercises daily. Follow up in 1 week for office visit if symptoms are not improving.  X-rays today.   Your prescription(s) have been submitted to your pharmacy or been printed and provided for you. Please take as directed and contact our office if you believe you are having problem(s) with the medication(s) or have any questions.  Please stop by radiology on the basement level of the building for your x-rays. Your results will be released to [place] (or called to you) after review, usually within 72 hours after test completion. If any treatments or changes are necessary, you will be notified at that same time.  If your symptoms worsen or fail to improve, please contact our office for further instruction, or in case of emergency go directly to the emergency room at the closest medical facility.    Peripheral Neuropathy Peripheral neuropathy is a type of nerve damage. It affects nerves that carry signals between the spinal cord and other parts of the body. These are called peripheral nerves. With peripheral neuropathy, one nerve or a group of nerves may be damaged.  CAUSES  Many things can damage peripheral nerves. For some people with peripheral neuropathy, the cause is unknown. Some causes include:  Diabetes. This is the most common cause of peripheral neuropathy.  Injury to a nerve.  Pressure or stress on a nerve that lasts a long time.  Too little vitamin B. Alcoholism can lead to this.  Infections.  Autoimmune diseases, such as multiple sclerosis and systemic lupus erythematosus.  Inherited nerve diseases.  Some medicines, such as cancer drugs.  Toxic substances, such as lead and mercury.  Too little blood flowing to the legs.  Kidney  disease.  Thyroid disease. SIGNS AND SYMPTOMS  Different people have different symptoms. The symptoms you have will depend on which of your nerves is damaged. Common symptoms include:  Loss of feeling (numbness) in the feet and hands.  Tingling in the feet and hands.  Pain that burns.  Very sensitive skin.  Weakness.  Not being able to move a part of the body (paralysis).  Muscle twitching.  Clumsiness or poor coordination.  Loss of balance.  Not being able to control your bladder.  Feeling dizzy.  Sexual problems. DIAGNOSIS  Peripheral neuropathy is a symptom, not a disease. Finding the cause of peripheral neuropathy can be hard. To figure that out, your health care provider will take a medical history and do a physical exam. A neurological exam will also be done. This involves checking things affected by your brain, spinal cord, and nerves (nervous system). For example, your health care provider will check your reflexes, how you move, and what you can feel.  Other types of tests may also be ordered, such as:  Blood tests.  A test of the fluid in your spinal cord.  Imaging tests, such as CT scans or an MRI.  Electromyography (EMG). This test checks the nerves that control muscles.  Nerve conduction velocity tests. These tests check how fast messages pass through your nerves.  Nerve biopsy. A small piece of nerve is removed. It is then checked under a microscope. TREATMENT   Medicine is often used to treat peripheral neuropathy. Medicines may include:  Pain-relieving medicines. Prescription or over-the-counter medicine may be suggested.  Antiseizure medicine.  This may be used for pain.  Antidepressants. These also may help ease pain from neuropathy.  Lidocaine. This is a numbing medicine. You might wear a patch or be given a shot.  Mexiletine. This medicine is typically used to help control irregular heart rhythms.  Surgery. Surgery may be needed to relieve  pressure on a nerve or to destroy a nerve that is causing pain.  Physical therapy to help movement.  Assistive devices to help movement. HOME CARE INSTRUCTIONS   Only take over-the-counter or prescription medicines as directed by your health care provider. Follow the instructions carefully for any given medicines. Do not take any other medicines without first getting approval from your health care provider.  If you have diabetes, work closely with your health care provider to keep your blood sugar under control.  If you have numbness in your feet:  Check every day for signs of injury or infection. Watch for redness, warmth, and swelling.  Wear padded socks and comfortable shoes. These help protect your feet.  Do not do things that put pressure on your damaged nerve.  Do not smoke. Smoking keeps blood from getting to damaged nerves.  Avoid or limit alcohol. Too much alcohol can cause a lack of B vitamins. These vitamins are needed for healthy nerves.  Develop a good support system. Coping with peripheral neuropathy can be stressful. Talk to a mental health specialist or join a support group if you are struggling.  Follow up with your health care provider as directed. SEEK MEDICAL CARE IF:   You have new signs or symptoms of peripheral neuropathy.  You are struggling emotionally from dealing with peripheral neuropathy.  You have a fever. SEEK IMMEDIATE MEDICAL CARE IF:   You have an injury or infection that is not healing.  You feel very dizzy or begin vomiting.  You have chest pain.  You have trouble breathing.   This information is not intended to replace advice given to you by your health care provider. Make sure you discuss any questions you have with your health care provider.   Document Released: 04/16/2002 Document Revised: 01/06/2011 Document Reviewed: 01/01/2013 Elsevier Interactive Patient Education 2016 [employer]  Cervical Strain and Sprain With  Rehab Cervical strain and sprain are injuries that commonly occur with "whiplash" injuries. Whiplash occurs when the neck is forcefully whipped backward or forward, such as during a motor vehicle accident or during contact sports. The muscles, ligaments, tendons, discs, and nerves of the neck are susceptible to injury when this occurs. RISK FACTORS Risk of having a whiplash injury increases if:  Osteoarthritis of the spine.  Situations that make head or neck accidents or trauma more likely.  High-risk sports (football, rugby, wrestling, hockey, auto racing, gymnastics, diving, contact karate, or boxing).  Poor strength and flexibility of the neck.  Previous neck injury.  Poor tackling technique.  Improperly fitted or padded equipment. SYMPTOMS   Pain or stiffness in the front or back of neck or both.  Symptoms may present immediately or up to 24 hours after injury.  Dizziness, headache, nausea, and vomiting.  Muscle spasm with soreness and stiffness in the neck.  Tenderness and swelling at the injury site. PREVENTION  Learn and use proper technique (avoid tackling with the head, spearing, and head-butting; use proper falling techniques to avoid landing on the head).  Warm up and stretch properly before activity.  Maintain physical fitness:  Strength, flexibility, and endurance.  Cardiovascular fitness.  Wear properly fitted and padded protective  equipment, such as padded soft collars, for participation in contact sports. PROGNOSIS  Recovery from cervical strain and sprain injuries is dependent on the extent of the injury. These injuries are usually curable in 1 week to 3 months with appropriate treatment.  RELATED COMPLICATIONS   Temporary numbness and weakness may occur if the nerve roots are damaged, and this may persist until the nerve has completely healed.  Chronic pain due to frequent recurrence of symptoms.  Prolonged healing, especially if activity is resumed  too soon (before complete recovery). TREATMENT  Treatment initially involves the use of ice and medication to help reduce pain and inflammation. It is also important to perform strengthening and stretching exercises and modify activities that worsen symptoms so the injury does not get worse. These exercises may be performed at home or with a therapist. For patients who experience severe symptoms, a soft, padded collar may be recommended to be worn around the neck.  Improving your posture may help reduce symptoms. Posture improvement includes pulling your chin and abdomen in while sitting or standing. If you are sitting, sit in a firm chair with your buttocks against the back of the chair. While sleeping, try replacing your pillow with a small towel rolled to 2 inches in diameter, or use a cervical pillow or soft cervical collar. Poor sleeping positions delay healing.  For patients with nerve root damage, which causes numbness or weakness, the use of a cervical traction apparatus may be recommended. Surgery is rarely necessary for these injuries. However, cervical strain and sprains that are present at birth (congenital) may require surgery. MEDICATION   If pain medication is necessary, nonsteroidal anti-inflammatory medications, such as aspirin and ibuprofen, or other minor pain relievers, such as acetaminophen, are often recommended.  Do not take pain medication for 7 days before surgery.  Prescription pain relievers may be given if deemed necessary by your caregiver. Use only as directed and only as much as you need. HEAT AND COLD:   Cold treatment (icing) relieves pain and reduces inflammation. Cold treatment should be applied for 10 to 15 minutes every 2 to 3 hours for inflammation and pain and immediately after any activity that aggravates your symptoms. Use ice packs or an ice massage.  Heat treatment may be used prior to performing the stretching and strengthening activities prescribed by your  caregiver, physical therapist, or athletic trainer. Use a heat pack or a warm soak. SEEK MEDICAL CARE IF:   Symptoms get worse or do not improve in 2 weeks despite treatment.  New, unexplained symptoms develop (drugs used in treatment may produce side effects). EXERCISES RANGE OF MOTION (ROM) AND STRETCHING EXERCISES - Cervical Strain and Sprain These exercises may help you when beginning to rehabilitate your injury. In order to successfully resolve your symptoms, you must improve your posture. These exercises are designed to help reduce the forward-head and rounded-shoulder posture which contributes to this condition. Your symptoms may resolve with or without further involvement from your physician, physical therapist or athletic trainer. While completing these exercises, remember:   Restoring tissue flexibility helps normal motion to return to the joints. This allows healthier, less painful movement and activity.  An effective stretch should be held for at least 20 seconds, although you may need to begin with shorter hold times for comfort.  A stretch should never be painful. You should only feel a gentle lengthening or release in the stretched tissue. STRETCH- Axial Extensors  Lie on your back on the floor. You may  bend your knees for comfort. Place a rolled-up hand towel or dish towel, about 2 inches in diameter, under the part of your head that makes contact with the floor.  Gently tuck your chin, as if trying to make a "double chin," until you feel a gentle stretch at the base of your head.  Hold __________ seconds. Repeat __________ times. Complete this exercise __________ times per day.  STRETCH - Axial Extension   Stand or sit on a firm surface. Assume a good posture: chest up, shoulders drawn back, abdominal muscles slightly tense, knees unlocked (if standing) and feet hip width apart.  Slowly retract your chin so your head slides back and your chin slightly lowers. Continue to  look straight ahead.  You should feel a gentle stretch in the back of your head. Be certain not to feel an aggressive stretch since this can cause headaches later.  Hold for __________ seconds. Repeat __________ times. Complete this exercise __________ times per day. STRETCH - Cervical Side Bend   Stand or sit on a firm surface. Assume a good posture: chest up, shoulders drawn back, abdominal muscles slightly tense, knees unlocked (if standing) and feet hip width apart.  Without letting your nose or shoulders move, slowly tip your right / left ear to your shoulder until your feel a gentle stretch in the muscles on the opposite side of your neck.  Hold __________ seconds. Repeat __________ times. Complete this exercise __________ times per day. STRETCH - Cervical Rotators   Stand or sit on a firm surface. Assume a good posture: chest up, shoulders drawn back, abdominal muscles slightly tense, knees unlocked (if standing) and feet hip width apart.  Keeping your eyes level with the ground, slowly turn your head until you feel a gentle stretch along the back and opposite side of your neck.  Hold __________ seconds. Repeat __________ times. Complete this exercise __________ times per day. RANGE OF MOTION - Neck Circles   Stand or sit on a firm surface. Assume a good posture: chest up, shoulders drawn back, abdominal muscles slightly tense, knees unlocked (if standing) and feet hip width apart.  Gently roll your head down and around from the back of one shoulder to the back of the other. The motion should never be forced or painful.  Repeat the motion 10-20 times, or until you feel the neck muscles relax and loosen. Repeat __________ times. Complete the exercise __________ times per day. STRENGTHENING EXERCISES - Cervical Strain and Sprain These exercises may help you when beginning to rehabilitate your injury. They may resolve your symptoms with or without further involvement from your  physician, physical therapist, or athletic trainer. While completing these exercises, remember:   Muscles can gain both the endurance and the strength needed for everyday activities through controlled exercises.  Complete these exercises as instructed by your physician, physical therapist, or athletic trainer. Progress the resistance and repetitions only as guided.  You may experience muscle soreness or fatigue, but the pain or discomfort you are trying to eliminate should never worsen during these exercises. If this pain does worsen, stop and make certain you are following the directions exactly. If the pain is still present after adjustments, discontinue the exercise until you can discuss the trouble with your clinician. STRENGTH - Cervical Flexors, Isometric  Face a wall, standing about 6 inches away. Place a small pillow, a ball about 6-8 inches in diameter, or a folded towel between your forehead and the wall.  Slightly tuck your chin  and gently push your forehead into the soft object. Push only with mild to moderate intensity, building up tension gradually. Keep your jaw and forehead relaxed.  Hold 10 to 20 seconds. Keep your breathing relaxed.  Release the tension slowly. Relax your neck muscles completely before you start the next repetition. Repeat __________ times. Complete this exercise __________ times per day. STRENGTH- Cervical Lateral Flexors, Isometric   Stand about 6 inches away from a wall. Place a small pillow, a ball about 6-8 inches in diameter, or a folded towel between the side of your head and the wall.  Slightly tuck your chin and gently tilt your head into the soft object. Push only with mild to moderate intensity, building up tension gradually. Keep your jaw and forehead relaxed.  Hold 10 to 20 seconds. Keep your breathing relaxed.  Release the tension slowly. Relax your neck muscles completely before you start the next repetition. Repeat __________ times.  Complete this exercise __________ times per day. STRENGTH - Cervical Extensors, Isometric   Stand about 6 inches away from a wall. Place a small pillow, a ball about 6-8 inches in diameter, or a folded towel between the back of your head and the wall.  Slightly tuck your chin and gently tilt your head back into the soft object. Push only with mild to moderate intensity, building up tension gradually. Keep your jaw and forehead relaxed.  Hold 10 to 20 seconds. Keep your breathing relaxed.  Release the tension slowly. Relax your neck muscles completely before you start the next repetition. Repeat __________ times. Complete this exercise __________ times per day. POSTURE AND BODY MECHANICS CONSIDERATIONS - Cervical Strain and Sprain Keeping correct posture when sitting, standing or completing your activities will reduce the stress put on different body tissues, allowing injured tissues a chance to heal and limiting painful experiences. The following are general guidelines for improved posture. Your physician or physical therapist will provide you with any instructions specific to your needs. While reading these guidelines, remember:  The exercises prescribed by your provider will help you have the flexibility and strength to maintain correct postures.  The correct posture provides the optimal environment for your joints to work. All of your joints have less wear and tear when properly supported by a spine with good posture. This means you will experience a healthier, less painful body.  Correct posture must be practiced with all of your activities, especially prolonged sitting and standing. Correct posture is as important when doing repetitive low-stress activities (typing) as it is when doing a single heavy-load activity (lifting). PROLONGED STANDING WHILE SLIGHTLY LEANING FORWARD When completing a task that requires you to lean forward while standing in one place for a long time, place either foot  up on a stationary 2- to 4-inch high object to help maintain the best posture. When both feet are on the ground, the low back tends to lose its slight inward curve. If this curve flattens (or becomes too large), then the back and your other joints will experience too much stress, fatigue more quickly, and can cause pain.  RESTING POSITIONS Consider which positions are most painful for you when choosing a resting position. If you have pain with flexion-based activities (sitting, bending, stooping, squatting), choose a position that allows you to rest in a less flexed posture. You would want to avoid curling into a fetal position on your side. If your pain worsens with extension-based activities (prolonged standing, working overhead), avoid resting in an extended position such  as sleeping on your stomach. Most people will find more comfort when they rest with their spine in a more neutral position, neither too rounded nor too arched. Lying on a non-sagging bed on your side with a pillow between your knees, or on your back with a pillow under your knees will often provide some relief. Keep in mind, being in any one position for a prolonged period of time, no matter how correct your posture, can still lead to stiffness. WALKING Walk with an upright posture. Your ears, shoulders, and hips should all line up. OFFICE WORK When working at a desk, create an environment that supports good, upright posture. Without extra support, muscles fatigue and lead to excessive strain on joints and other tissues. CHAIR:  A chair should be able to slide under your desk when your back makes contact with the back of the chair. This allows you to work closely.  The chair's height should allow your eyes to be level with the upper part of your monitor and your hands to be slightly lower than your elbows.  Body position:  Your feet should make contact with the floor. If this is not possible, use a foot rest.  Keep your ears  over your shoulders. This will reduce stress on your neck and low back.   This information is not intended to replace advice given to you by your health care provider. Make sure you discuss any questions you have with your health care provider.   Document Released: 04/26/2005 Document Revised: 05/17/2014 Document Reviewed: 08/08/2008 Elsevier Interactive Patient Education [employer].

## 2015-07-25 NOTE — Assessment & Plan Note (Signed)
Pain of the upper left extremity consistent with possible nerve impingement or rotator cuff pathology. Treat conservatively with ice and home exercise therapy. In office injection of Depo-Medrol and Toradol provided. Patient instructed to adjust diabetes medications to accommodate for Depo-Medrol injection. Start Pennsaid. Start gabapentin as needed for neuropathic pain. Obtain cervical x-rays to rule out disc pathology or stenosis. Follow-up in one week or sooner if symptoms worsen or do not improve.

## 2015-07-25 NOTE — Progress Notes (Signed)
Subjective:    Patient ID: [name] [name], male    DOB: 08-Oct-1950, 65 y.o.   MRN: [id number]  Chief Complaint  Patient presents with  . Follow-up    He thinks that he has a pinched nerve, left arm pain and tingling, MRI, pain refills    HPI:  [name] [name] is a 65 y.o. male who  has a past medical history of OSA (obstructive sleep apnea); Hypertension; Hyperlipidemia; Diabetes mellitus, type 2 ([place]); Exogenous obesity; Diverticulosis of colon; Testicular hypofunction; Erectile dysfunction; Cervical disc disease; Bursitis of left shoulder; and History of anemia. and presents today for a follow up office visit.  This is a new problem. Associated symptom of pain located in his left upper extremity has been going on for about 10 days. Pain is described as a shooting pain that is constant and goes through his upper extremity. Severity is "enough to put him to his knees."  Modifying factors include prednisone and and hydrocodone which did not help very much with the pain. Denies any trauma however has had neck surgery in the past.  Allergies  Allergen Reactions  . Amoxicillin Nausea And Vomiting  . Lisinopril     Cough   . Penicillins     Per pt: unknown  . Simvastatin     myalgia     Current Outpatient Prescriptions on File Prior to Visit  Medication Sig Dispense Refill  . acyclovir (ZOVIRAX) 800 MG tablet Take 1 tablet (800 mg total) by mouth 5 (five) times daily. 50 tablet 0  . amLODipine (NORVASC) 10 MG tablet Take 1 tablet (10 mg total) by mouth daily. 90 tablet 3  . aspirin 81 MG tablet Take 81 mg by mouth daily.      . B-D ULTRAFINE III SHORT PEN 31G X 8 MM MISC As directed 100 each 3  . CIALIS 5 MG tablet Take 1 tablet by mouth daily.    . cyclobenzaprine (AMRIX) 15 MG 24 hr capsule Take 1 capsule (15 mg total) by mouth daily as needed for muscle spasms. 30 capsule 0  . diclofenac (VOLTAREN) 75 MG EC tablet Take 1 tablet (75 mg total) by mouth 2 (two) times daily. 30  tablet 2  . dicyclomine (BENTYL) 20 MG tablet Take 1 tablet (20 mg total) by mouth every 6 (six) hours as needed for spasms (for abdominal cramping). 20 tablet 0  . furosemide (LASIX) 40 MG tablet Take 1 tablet (40 mg total) by mouth daily. 90 tablet 3  . HYDROcodone-acetaminophen (NORCO) 10-325 MG tablet Take 1 tablet by mouth every 8 (eight) hours as needed for severe pain. 90 tablet 0  . Insulin Glargine (LANTUS) 100 UNIT/ML Solostar Pen Inject 150 Units into the skin every morning. 135 mL 3  . ketorolac (TORADOL) 10 MG tablet Take 1 tablet (10 mg total) by mouth every 8 (eight) hours as needed for severe pain (kidney stone pain). 30 tablet 0  . losartan (COZAAR) 100 MG tablet Take 1 tablet (100 mg total) by mouth daily. 90 tablet 3  . lubiprostone (AMITIZA) 24 MCG capsule Take 1 capsule (24 mcg total) by mouth 2 (two) times daily with a meal. 60 capsule 11  . metoprolol succinate (TOPROL XL) 25 MG 24 hr tablet Take 1 tablet (25 mg total) by mouth daily. 90 tablet 3  . ondansetron (ZOFRAN) 4 MG tablet Take 1 tablet (4 mg total) by mouth every 6 (six) hours as needed for nausea. 65 tablet 0  . predniSONE (DELTASONE) 10  MG tablet Prednisone 10 mg: take 4 tabs a day x 3 days; then 3 tabs a day x 4 days; then 2 tabs a day x 4 days, then 1 tab a day x 6 days, then stop. Take pc. 38 tablet 0  . rosuvastatin (CRESTOR) 20 MG tablet Take 1 tablet (20 mg total) by mouth daily. 90 tablet 3  . tamsulosin (FLOMAX) 0.4 MG CAPS capsule Take 1 capsule (0.4 mg total) by mouth daily after supper. (Patient taking differently: Take 0.8 mg by mouth daily after supper. ) 90 capsule 3   No current facility-administered medications on file prior to visit.     Past Surgical History  Procedure Laterality Date  . Posterior laminectomy / decompression cervical spine      c5-6 fusion 12/08 Dr. Consuello Masse  . S/p cervical disc surgery and fusion  10/09    Dr. Consuello Masse   Past Medical History  Diagnosis Date  . OSA  (obstructive sleep apnea)     cpap  . Hypertension   . Hyperlipidemia   . Diabetes mellitus, type 2 ([place])   . Exogenous obesity   . Diverticulosis of colon   . Testicular hypofunction   . Erectile dysfunction   . Cervical disc disease   . Bursitis of left shoulder   . History of anemia     Review of Systems  Constitutional: Negative for fever and chills.  Musculoskeletal:       Positive for left upper extremity pain.   Neurological: Positive for weakness and numbness.      Objective:    BP 146/72 mmHg  Pulse 90  Temp(Src) 98 F (36.7 C) (Oral)  Resp 18  Ht 5\' 10"  (1.778 m)  Wt 303 lb (137.44 kg)  BMI 43.48 kg/m2  SpO2 97% Nursing note and vital signs reviewed.  Physical Exam  Constitutional: He is oriented to person, place, and time. He appears well-developed and well-nourished. No distress.  Cardiovascular: Normal rate, regular rhythm, normal heart sounds and intact distal pulses.   Pulmonary/Chest: Effort normal and breath sounds normal.  Musculoskeletal:  Left shoulder/neck- no obvious deformity, discoloration, or edema noted. Tenderness elicited over subacromial space and upper trapezius muscle. There is decreased sensation along the proximal upper extremity and and just distal to the elbow approximately halfway down the forearm. Range of motion is restricted in flexion to approximately 70 with abduction to approximately 90 against gravity. Neck range of motion is slightly limited in lateral bending Strength is 3+. Pulses are intact and appropriate. Negative Neer's impingement; empty can with significant weakness; negative Michel Bickers; negative apprehension. Negative cervical compression.  Neurological: He is alert and oriented to person, place, and time.  Skin: Skin is warm and dry.  Psychiatric: He has a normal mood and affect. His behavior is normal. Judgment and thought content normal.       Assessment & Plan:   Problem List Items Addressed This Visit       Other   Pain, upper extremity - Primary    Pain of the upper left extremity consistent with possible nerve impingement or rotator cuff pathology. Treat conservatively with ice and home exercise therapy. In office injection of Depo-Medrol and Toradol provided. Patient instructed to adjust diabetes medications to accommodate for Depo-Medrol injection. Start Pennsaid. Start gabapentin as needed for neuropathic pain. Obtain cervical x-rays to rule out disc pathology or stenosis. Follow-up in one week or sooner if symptoms worsen or do not improve.      Relevant Medications  Diclofenac Sodium (PENNSAID) 2 % SOLN   gabapentin (NEURONTIN) 100 MG capsule   methylPREDNISolone acetate (DEPO-MEDROL) injection 80 mg (Completed)   ketorolac (TORADOL) injection 60 mg (Completed)   Other Relevant Orders   DG Cervical Spine Complete (Completed)

## 2015-07-27 ENCOUNTER — Encounter: Payer: Self-pay | Admitting: Family

## 2015-07-28 MED ORDER — NAPROXEN-ESOMEPRAZOLE 500-20 MG PO TBEC: 1.0000 | DELAYED_RELEASE_TABLET | Freq: Two times a day (BID) | ORAL | Status: DC | PRN

## 2015-08-05 ENCOUNTER — Encounter: Payer: Self-pay | Admitting: Family

## 2015-08-05 ENCOUNTER — Ambulatory Visit (INDEPENDENT_AMBULATORY_CARE_PROVIDER_SITE_OTHER): Payer: Managed Care, Other (non HMO) | Admitting: Family

## 2015-08-05 VITALS — BP 158/64 | HR 87 | Temp 98.0°F | Resp 16 | Ht 70.0 in | Wt 301.0 lb

## 2015-08-05 DIAGNOSIS — M79602 Pain in left arm: Secondary | ICD-10-CM

## 2015-08-05 MED ORDER — TIZANIDINE HCL 4 MG PO TABS: 4.0000 mg | ORAL_TABLET | Freq: Four times a day (QID) | ORAL | Status: DC | PRN

## 2015-08-05 NOTE — Patient Instructions (Signed)
Thank you for choosing [facility].  Summary/Instructions:  Continue to ice 2-3 times per day.  Exercises daily. Continue Vimovo.  Continue gabapentin Pennsaid as needed. Start Zanaflex as needed for spasms.   Your prescription(s) have been submitted to your pharmacy or been printed and provided for you. Please take as directed and contact our office if you believe you are having problem(s) with the medication(s) or have any questions.  If your symptoms worsen or fail to improve, please contact our office for further instruction, or in case of emergency go directly to the emergency room at the closest medical facility.

## 2015-08-05 NOTE — Progress Notes (Signed)
Pre visit review using our clinic review tool, if applicable. No additional management support is needed unless otherwise documented below in the visit note. 

## 2015-08-05 NOTE — Assessment & Plan Note (Signed)
Left upper extremity pain improved with current regimen although continues to have symptoms with question regarding origin of symptoms being cervical spine or shoulder. Continue conservative treatment and continue current dosage of Pennsaid gabapentin and Vimovo. Start Zanaflex as needed for spasm. Continue ice and home exercise therapy. Consider further imaging of shoulder or possible cortisone injection of the left shoulder for diagnostic purpose.

## 2015-08-05 NOTE — Progress Notes (Signed)
Subjective:    Patient ID: [name] [name], male    DOB: 1950/11/23, 65 y.o.   MRN: [id number]  Chief Complaint  Patient presents with  . Follow-up    still having pain in his arm, tingling and cramping still occurs in left hand, minimal improvement    HPI:  [name] [name] is a 65 y.o. male who  has a past medical history of OSA (obstructive sleep apnea); Hypertension; Hyperlipidemia; Diabetes mellitus, type 2 ([place]); Exogenous obesity; Diverticulosis of colon; Testicular hypofunction; Erectile dysfunction; Cervical disc disease; Bursitis of left shoulder; and History of anemia. and presents today for a sports medicine follow up.   Previously seen in the office with left upper extremity pain consistent with possible nerve impingement. Treated with Pennsaid, gabapentin and injections of depo-medrol and ketorelac. Cervical spine films show stable alignment status post anterior and posterior fusions with no acute findings. He has had some moderate amounts of improvements including decreased tingling in his upper arm and has noted some increased range of motion of the left shoulder. He continues to have pain in his arm with tingling and cramping. Notes that his pain has localized around his distal humerus and proximal forearm. He has not had to take any narcotic pain medication. Notes that he has done the exercises, probably not as much as he should.   Allergies  Allergen Reactions  . Amoxicillin Nausea And Vomiting  . Lisinopril     Cough   . Penicillins     Per pt: unknown  . Simvastatin     myalgia     Current Outpatient Prescriptions on File Prior to Visit  Medication Sig Dispense Refill  . acyclovir (ZOVIRAX) 800 MG tablet Take 1 tablet (800 mg total) by mouth 5 (five) times daily. 50 tablet 0  . amLODipine (NORVASC) 10 MG tablet Take 1 tablet (10 mg total) by mouth daily. 90 tablet 3  . aspirin 81 MG tablet Take 81 mg by mouth daily.      . B-D ULTRAFINE III SHORT PEN 31G X 8 MM  MISC As directed 100 each 3  . CIALIS 5 MG tablet Take 1 tablet by mouth daily.    . cyclobenzaprine (AMRIX) 15 MG 24 hr capsule Take 1 capsule (15 mg total) by mouth daily as needed for muscle spasms. 30 capsule 0  . diclofenac (VOLTAREN) 75 MG EC tablet Take 1 tablet (75 mg total) by mouth 2 (two) times daily. 30 tablet 2  . Diclofenac Sodium (PENNSAID) 2 % SOLN Place 1 application onto the skin 2 (two) times daily as needed. 112 g 1  . dicyclomine (BENTYL) 20 MG tablet Take 1 tablet (20 mg total) by mouth every 6 (six) hours as needed for spasms (for abdominal cramping). 20 tablet 0  . furosemide (LASIX) 40 MG tablet Take 1 tablet (40 mg total) by mouth daily. 90 tablet 3  . gabapentin (NEURONTIN) 100 MG capsule Take 1 tablet by mouth nightly at bedtime. May increase to 2 tablets after 3 days if needed. 60 capsule 0  . HYDROcodone-acetaminophen (NORCO) 10-325 MG tablet Take 1 tablet by mouth every 8 (eight) hours as needed for severe pain. 90 tablet 0  . Insulin Glargine (LANTUS) 100 UNIT/ML Solostar Pen Inject 150 Units into the skin every morning. 135 mL 3  . ketorolac (TORADOL) 10 MG tablet Take 1 tablet (10 mg total) by mouth every 8 (eight) hours as needed for severe pain (kidney stone pain). 30 tablet 0  . losartan (COZAAR)  100 MG tablet Take 1 tablet (100 mg total) by mouth daily. 90 tablet 3  . lubiprostone (AMITIZA) 24 MCG capsule Take 1 capsule (24 mcg total) by mouth 2 (two) times daily with a meal. 60 capsule 11  . metoprolol succinate (TOPROL XL) 25 MG 24 hr tablet Take 1 tablet (25 mg total) by mouth daily. 90 tablet 3  . Naproxen-Esomeprazole 500-20 MG TBEC Take 1 tablet by mouth 2 (two) times daily as needed. 60 tablet 1  . ondansetron (ZOFRAN) 4 MG tablet Take 1 tablet (4 mg total) by mouth every 6 (six) hours as needed for nausea. 65 tablet 0  . predniSONE (DELTASONE) 10 MG tablet Prednisone 10 mg: take 4 tabs a day x 3 days; then 3 tabs a day x 4 days; then 2 tabs a day x 4  days, then 1 tab a day x 6 days, then stop. Take pc. 38 tablet 0  . rosuvastatin (CRESTOR) 20 MG tablet Take 1 tablet (20 mg total) by mouth daily. 90 tablet 3  . tamsulosin (FLOMAX) 0.4 MG CAPS capsule Take 1 capsule (0.4 mg total) by mouth daily after supper. (Patient taking differently: Take 0.8 mg by mouth daily after supper. ) 90 capsule 3   No current facility-administered medications on file prior to visit.     Past Surgical History  Procedure Laterality Date  . Posterior laminectomy / decompression cervical spine      c5-6 fusion 12/08 Dr. Consuello Masse  . S/p cervical disc surgery and fusion  10/09    Dr. Consuello Masse     Review of Systems  Constitutional: Negative for fever and chills.  Respiratory: Negative for chest tightness, shortness of breath and wheezing.   Cardiovascular: Negative for chest pain, palpitations and leg swelling.  Musculoskeletal: Positive for neck pain and neck stiffness.       Positive for shoulder pain.  Neurological: Positive for numbness. Negative for weakness.      Objective:    BP 158/64 mmHg  Pulse 87  Temp(Src) 98 F (36.7 C) (Oral)  Resp 16  Ht 5\' 10"  (1.778 m)  Wt 301 lb (136.533 kg)  BMI 43.19 kg/m2  SpO2 96% Nursing note and vital signs reviewed.  Physical Exam  Constitutional: He is oriented to person, place, and time. He appears well-developed and well-nourished. No distress.  Neck:  No obvious deformity, discoloration, or edema noted. No palpable tenderness able to be elicited. Range of motion is severely restricted in lateral bending and rotation. Negative cervical compression test.  Cardiovascular: Normal rate, regular rhythm, normal heart sounds and intact distal pulses.   Pulmonary/Chest: Effort normal and breath sounds normal.  Musculoskeletal:  Left shoulder - no obvious deformity, discoloration, or edema noted. Tenderness elicited along subacromial space and deltoid muscle. Range of motion is slightly reduced on the left compared  to the right with discomfort noted above 120 of flexion and abduction. Distal pulses and sensation are intact and appropriate.  Neurological: He is alert and oriented to person, place, and time.  Skin: Skin is warm and dry.  Psychiatric: He has a normal mood and affect. His behavior is normal. Judgment and thought content normal.       Assessment & Plan:   Problem List Items Addressed This Visit      Other   Pain, upper extremity - Primary    Left upper extremity pain improved with current regimen although continues to have symptoms with question regarding origin of symptoms being cervical spine or shoulder.  Continue conservative treatment and continue current dosage of Pennsaid gabapentin and Vimovo. Start Zanaflex as needed for spasm. Continue ice and home exercise therapy. Consider further imaging of shoulder or possible cortisone injection of the left shoulder for diagnostic purpose.       Relevant Medications   tiZANidine (ZANAFLEX) 4 MG tablet

## 2015-08-13 ENCOUNTER — Encounter: Payer: Self-pay | Admitting: Internal Medicine

## 2015-08-13 ENCOUNTER — Ambulatory Visit (INDEPENDENT_AMBULATORY_CARE_PROVIDER_SITE_OTHER): Payer: Managed Care, Other (non HMO) | Admitting: Internal Medicine

## 2015-08-13 VITALS — BP 144/62 | HR 88 | Temp 97.3°F | Wt 304.0 lb

## 2015-08-13 DIAGNOSIS — Z794 Long term (current) use of insulin: Secondary | ICD-10-CM

## 2015-08-13 DIAGNOSIS — E1165 Type 2 diabetes mellitus with hyperglycemia: Secondary | ICD-10-CM | POA: Diagnosis not present

## 2015-08-13 DIAGNOSIS — R05 Cough: Secondary | ICD-10-CM

## 2015-08-13 DIAGNOSIS — M5431 Sciatica, right side: Secondary | ICD-10-CM

## 2015-08-13 DIAGNOSIS — M5030 Other cervical disc degeneration, unspecified cervical region: Secondary | ICD-10-CM

## 2015-08-13 DIAGNOSIS — R059 Cough, unspecified: Secondary | ICD-10-CM

## 2015-08-13 MED ORDER — PREDNISONE 10 MG PO TABS: ORAL_TABLET | ORAL | Status: DC

## 2015-08-13 MED ORDER — HYDROCOD POLST-CPM POLST ER 10-8 MG/5ML PO SUER: 5.0000 mL | Freq: Two times a day (BID) | ORAL | Status: DC | PRN

## 2015-08-13 NOTE — Progress Notes (Signed)
Subjective:  Patient ID: [name] [name], male    DOB: 1950-09-03  Age: 65 y.o. MRN: [id number]  CC: No chief complaint on file.   HPI [name] [name] presents for a f/u  C/o left upper extremity pain consistent with possible nerve impingement. L arm and hand would go to sleep. C/o cramps in L hand. The pain in the R hip has resolved! LS MRI was denied.  C/o cough  Outpatient Prescriptions Prior to Visit  Medication Sig Dispense Refill  . acyclovir (ZOVIRAX) 800 MG tablet Take 1 tablet (800 mg total) by mouth 5 (five) times daily. 50 tablet 0  . amLODipine (NORVASC) 10 MG tablet Take 1 tablet (10 mg total) by mouth daily. 90 tablet 3  . aspirin 81 MG tablet Take 81 mg by mouth daily.      . B-D ULTRAFINE III SHORT PEN 31G X 8 MM MISC As directed 100 each 3  . CIALIS 5 MG tablet Take 1 tablet by mouth daily.    . cyclobenzaprine (AMRIX) 15 MG 24 hr capsule Take 1 capsule (15 mg total) by mouth daily as needed for muscle spasms. 30 capsule 0  . diclofenac (VOLTAREN) 75 MG EC tablet Take 1 tablet (75 mg total) by mouth 2 (two) times daily. 30 tablet 2  . Diclofenac Sodium (PENNSAID) 2 % SOLN Place 1 application onto the skin 2 (two) times daily as needed. 112 g 1  . dicyclomine (BENTYL) 20 MG tablet Take 1 tablet (20 mg total) by mouth every 6 (six) hours as needed for spasms (for abdominal cramping). 20 tablet 0  . furosemide (LASIX) 40 MG tablet Take 1 tablet (40 mg total) by mouth daily. 90 tablet 3  . gabapentin (NEURONTIN) 100 MG capsule Take 1 tablet by mouth nightly at bedtime. May increase to 2 tablets after 3 days if needed. 60 capsule 0  . HYDROcodone-acetaminophen (NORCO) 10-325 MG tablet Take 1 tablet by mouth every 8 (eight) hours as needed for severe pain. 90 tablet 0  . Insulin Glargine (LANTUS) 100 UNIT/ML Solostar Pen Inject 150 Units into the skin every morning. 135 mL 3  . ketorolac (TORADOL) 10 MG tablet Take 1 tablet (10 mg total) by mouth every 8 (eight) hours as  needed for severe pain (kidney stone pain). 30 tablet 0  . losartan (COZAAR) 100 MG tablet Take 1 tablet (100 mg total) by mouth daily. 90 tablet 3  . lubiprostone (AMITIZA) 24 MCG capsule Take 1 capsule (24 mcg total) by mouth 2 (two) times daily with a meal. 60 capsule 11  . metoprolol succinate (TOPROL XL) 25 MG 24 hr tablet Take 1 tablet (25 mg total) by mouth daily. 90 tablet 3  . Naproxen-Esomeprazole 500-20 MG TBEC Take 1 tablet by mouth 2 (two) times daily as needed. 60 tablet 1  . ondansetron (ZOFRAN) 4 MG tablet Take 1 tablet (4 mg total) by mouth every 6 (six) hours as needed for nausea. 65 tablet 0  . predniSONE (DELTASONE) 10 MG tablet Prednisone 10 mg: take 4 tabs a day x 3 days; then 3 tabs a day x 4 days; then 2 tabs a day x 4 days, then 1 tab a day x 6 days, then stop. Take pc. 38 tablet 0  . rosuvastatin (CRESTOR) 20 MG tablet Take 1 tablet (20 mg total) by mouth daily. 90 tablet 3  . tamsulosin (FLOMAX) 0.4 MG CAPS capsule Take 1 capsule (0.4 mg total) by mouth daily after supper. (Patient taking differently: Take  0.8 mg by mouth daily after supper. ) 90 capsule 3  . tiZANidine (ZANAFLEX) 4 MG tablet Take 1 tablet (4 mg total) by mouth every 6 (six) hours as needed for muscle spasms. 30 tablet 0   No facility-administered medications prior to visit.    ROS Review of Systems  Constitutional: Negative for appetite change, fatigue and unexpected weight change.  HENT: Negative for congestion, nosebleeds, sneezing, sore throat and trouble swallowing.   Eyes: Negative for itching and visual disturbance.  Respiratory: Negative for cough.   Cardiovascular: Negative for chest pain, palpitations and leg swelling.  Gastrointestinal: Negative for nausea, diarrhea, blood in stool and abdominal distention.  Genitourinary: Negative for frequency and hematuria.  Musculoskeletal: Positive for back pain, arthralgias, neck pain and neck stiffness. Negative for joint swelling and gait problem.    Skin: Negative for rash.  Neurological: Positive for weakness and numbness. Negative for dizziness, tremors and speech difficulty.  Psychiatric/Behavioral: Negative for suicidal ideas, sleep disturbance, dysphoric mood and agitation. The patient is not nervous/anxious.     Objective:  BP 144/62 mmHg  Pulse 88  Temp(Src) 97.3 F (36.3 C)  Wt 304 lb (137.893 kg)  SpO2 97%  BP Readings from Last 3 Encounters:  08/13/15 144/62  08/05/15 158/64  07/25/15 146/72    Wt Readings from Last 3 Encounters:  08/13/15 304 lb (137.893 kg)  08/05/15 301 lb (136.533 kg)  07/25/15 303 lb (137.44 kg)    Physical Exam  Constitutional: He is oriented to person, place, and time. He appears well-developed. No distress.  NAD  HENT:  Mouth/Throat: Oropharynx is clear and moist.  Eyes: Conjunctivae are normal. Pupils are equal, round, and reactive to light.  Neck: Normal range of motion. No JVD present. No thyromegaly present.  Cardiovascular: Normal rate, regular rhythm, normal heart sounds and intact distal pulses.  Exam reveals no gallop and no friction rub.   No murmur heard. Pulmonary/Chest: Effort normal and breath sounds normal. No respiratory distress. He has no wheezes. He has no rales. He exhibits no tenderness.  Abdominal: Soft. Bowel sounds are normal. He exhibits no distension and no mass. There is no tenderness. There is no rebound and no guarding.  Musculoskeletal: Normal range of motion. He exhibits edema and tenderness.  Lymphadenopathy:    He has no cervical adenopathy.  Neurological: He is alert and oriented to person, place, and time. He has normal reflexes. No cranial nerve deficit. He exhibits normal muscle tone. He displays a negative Romberg sign. Coordination and gait normal.  Skin: Skin is warm and dry. No rash noted.  Psychiatric: He has a normal mood and affect. His behavior is normal. Judgment and thought content normal.    Lab Results  Component Value Date   WBC  6.5 04/09/2015   HGB 13.7 04/09/2015   HCT 41.0 04/09/2015   PLT 320.0 04/09/2015   GLUCOSE 271* 04/09/2015   CHOL 204* 04/09/2015   TRIG 128.0 04/09/2015   HDL 39.30 04/09/2015   LDLCALC 139* 04/09/2015   ALT 26 04/09/2015   AST 16 04/09/2015   NA 136 04/09/2015   K 4.0 04/09/2015   CL 101 04/09/2015   CREATININE 1.05 04/09/2015   BUN 21 04/09/2015   CO2 28 04/09/2015   TSH 4.15 04/09/2015   PSA 0.65 04/09/2015   INR 1.08 02/02/2011   HGBA1C 8.7* 04/09/2015   MICROALBUR 138.8 Verified by manual dilution.* 01/30/2014    Dg Cervical Spine Complete  07/25/2015  CLINICAL DATA:  Left neck  and shoulder pain EXAM: CERVICAL SPINE - COMPLETE 4+ VIEW COMPARISON:  03/07/2008, 02/18/2008 FINDINGS: previous anterior cervical discectomy and fusion at C5-6. Previous posterior fusion spanning C5 through T1. Degenerative disc disease and anterior osteophytes most pronounced at C4 anteriorly. Stable alignment. No hardware abnormality appreciated. Facets are aligned. Foramina appear patent. Intact odontoid. Clear lung apices. Trachea is midline. Carotid atherosclerosis noted. IMPRESSION: Stable alignment, status post anterior and posterior fusions as above. No acute finding by plain radiography. Electronically Signed   By: Jerilynn Mages.  Shick M.D.   On: 07/25/2015 16:00    Assessment & Plan:   There are no diagnoses linked to this encounter. I am having Mr. Bonifay maintain his aspirin, rosuvastatin, dicyclomine, ondansetron, tamsulosin, amLODipine, furosemide, metoprolol succinate, losartan, cyclobenzaprine, diclofenac, CIALIS, acyclovir, lubiprostone, B-D ULTRAFINE III SHORT PEN, HYDROcodone-acetaminophen, Insulin Glargine, ketorolac, predniSONE, Diclofenac Sodium, gabapentin, Naproxen-Esomeprazole, and tiZANidine.  No orders of the defined types were placed in this encounter.     Follow-up: No Follow-up on file.  Walker Kehr, MD

## 2015-08-13 NOTE — Assessment & Plan Note (Signed)
Tussionex prn  Potential benefits of opioids use as well as potential risks (i.e. addiction risk, apnea etc) and complications (i.e. Somnolence, constipation and others) were explained to the patient and were aknowledged. 

## 2015-08-13 NOTE — Assessment & Plan Note (Signed)
Dr Ellison 

## 2015-08-13 NOTE — Assessment & Plan Note (Addendum)
3/17 left upper extremity pain consistent with possible nerve impingement. L arm and hand would go to sleep. C/o cramps in L hand. H/o two C-spine surgeries Neurol ref

## 2015-08-13 NOTE — Assessment & Plan Note (Signed)
The pain in the R hip has resolved! LS MRI was denied.

## 2015-08-13 NOTE — Progress Notes (Signed)
Pre visit review using our clinic review tool, if applicable. No additional management support is needed unless otherwise documented below in the visit note. 

## 2015-09-04 ENCOUNTER — Ambulatory Visit (INDEPENDENT_AMBULATORY_CARE_PROVIDER_SITE_OTHER): Payer: Managed Care, Other (non HMO) | Admitting: Neurology

## 2015-09-04 ENCOUNTER — Encounter: Payer: Self-pay | Admitting: Neurology

## 2015-09-04 VITALS — BP 170/80 | HR 84 | Ht 70.0 in | Wt 303.0 lb

## 2015-09-04 DIAGNOSIS — Z9889 Other specified postprocedural states: Secondary | ICD-10-CM | POA: Diagnosis not present

## 2015-09-04 DIAGNOSIS — M5412 Radiculopathy, cervical region: Secondary | ICD-10-CM

## 2015-09-04 DIAGNOSIS — R202 Paresthesia of skin: Secondary | ICD-10-CM | POA: Diagnosis not present

## 2015-09-04 DIAGNOSIS — R29898 Other symptoms and signs involving the musculoskeletal system: Secondary | ICD-10-CM | POA: Diagnosis not present

## 2015-09-04 MED ORDER — GABAPENTIN 300 MG PO CAPS: ORAL_CAPSULE | ORAL | Status: DC

## 2015-09-04 MED ORDER — TIZANIDINE HCL 4 MG PO TABS: 4.0000 mg | ORAL_TABLET | Freq: Every evening | ORAL | Status: DC | PRN

## 2015-09-04 NOTE — Patient Instructions (Addendum)
Start gabapentin 300mg  1 tablet at bedtime for 3 days, then increase to 1 tablet twice daily. MRI cervical spine without contrast Return to clinic 3 months

## 2015-09-04 NOTE — Progress Notes (Signed)
[facility] Clinic Note - Initial Visit   Date: 09/04/2015  [name] [name] MRN: [id number] DOB: 18-Feb-1951   Dear Dr. Alain Marion:  Thank you for your kind referral of [name] [name] for consultation of left arm pain. Although his history is well known to you, please allow [place] to reiterate it for the purpose of our medical record. The patient was accompanied to the clinic by self.   History of Present Illness: [name] [name] is a 65 y.o. right-handed Caucasian male with hypertension, hyperlipidemia, insulin-dependent diabetes mellitus type 2, OSA, s/p cervical decompression and fusion at C5-6 presenting for evaluation of left arm pain.    Starting around the end of January 2017, he began noticing right hip pain and was given prednisone which helped.  He travelled to [place] and when he awoke in the morning at the hotel, he developed severe and sharp left upper arm and forearm pain. He has no sensation of his index and left middle finger on the left.  Pain is much worse when he is supine and has found that raising his arm provides some relief.  He has noticed increased weakness of his left hand and often drops objects.  He has tried prednisone, gabapentin, and NSAIDs which helps some, but does not completely alleviate the pain.   He endorses suffering a mechanical fall in December 2016, there was no loss of consciousness or neck pain, but is concern that this may have contributed to his new arm pain.   He has history of cervical decompression and fusion x 2 previously for bilateral upper extremity pain and paresthesias.     Out-side paper records, electronic medical record, and images have been reviewed where available and summarized as:  Lab Results  Component Value Date   TSH 4.15 04/09/2015   Lab Results  Component Value Date   HGBA1C 8.7* 04/09/2015   MRI cervical spine wo contrast 02/18/2008:  Postoperative fusion at C5-6. Cervical degenerative changes  are present at multiple levels.  Overall no change from prior MRI.    Past Medical History  Diagnosis Date  . OSA (obstructive sleep apnea)     cpap  . Hypertension   . Hyperlipidemia   . Diabetes mellitus, type 2 ([name] [name])   . Exogenous obesity   . Diverticulosis of colon   . Testicular hypofunction   . Erectile dysfunction   . Cervical disc disease   . Bursitis of left shoulder   . History of anemia     Past Surgical History  Procedure Laterality Date  . Posterior laminectomy / decompression cervical spine      c5-6 fusion 12/08 Dr. Consuello Masse  . S/p cervical disc surgery and fusion  10/09    Dr. Consuello Masse     Medications:  Outpatient Encounter Prescriptions as of 09/04/2015  Medication Sig Note  . acyclovir (ZOVIRAX) 800 MG tablet Take 1 tablet (800 mg total) by mouth 5 (five) times daily.   Marland Kitchen amLODipine (NORVASC) 10 MG tablet Take 1 tablet (10 mg total) by mouth daily.   Marland Kitchen aspirin 81 MG tablet Take 81 mg by mouth daily.     . B-D ULTRAFINE III SHORT PEN 31G X 8 MM MISC As directed   . chlorpheniramine-HYDROcodone (TUSSIONEX PENNKINETIC ER) 10-8 MG/5ML SUER Take 5 mLs by mouth every 12 (twelve) hours as needed for cough.   . CIALIS 5 MG tablet Take 1 tablet by mouth daily. 03/26/2015: Received from: External Pharmacy Received Sig:   . furosemide (LASIX) 40 MG  tablet Take 1 tablet (40 mg total) by mouth daily.   Marland Kitchen gabapentin (NEURONTIN) 100 MG capsule Take 1 tablet by mouth nightly at bedtime. May increase to 2 tablets after 3 days if needed.   Marland Kitchen HYDROcodone-acetaminophen (NORCO) 10-325 MG tablet Take 1 tablet by mouth every 8 (eight) hours as needed for severe pain.   . Insulin Glargine (LANTUS) 100 UNIT/ML Solostar Pen Inject 150 Units into the skin every morning.   Marland Kitchen ketorolac (TORADOL) 10 MG tablet Take 1 tablet (10 mg total) by mouth every 8 (eight) hours as needed for severe pain (kidney stone pain).   Marland Kitchen losartan (COZAAR) 100 MG tablet Take 1 tablet (100 mg total) by mouth  daily.   Marland Kitchen lubiprostone (AMITIZA) 24 MCG capsule Take 1 capsule (24 mcg total) by mouth 2 (two) times daily with a meal.   . metoprolol succinate (TOPROL XL) 25 MG 24 hr tablet Take 1 tablet (25 mg total) by mouth daily.   . Naproxen-Esomeprazole 500-20 MG TBEC Take 1 tablet by mouth 2 (two) times daily as needed.   . ondansetron (ZOFRAN) 4 MG tablet Take 1 tablet (4 mg total) by mouth every 6 (six) hours as needed for nausea.   . rosuvastatin (CRESTOR) 20 MG tablet Take 1 tablet (20 mg total) by mouth daily.   . tamsulosin (FLOMAX) 0.4 MG CAPS capsule Take 1 capsule (0.4 mg total) by mouth daily after supper. (Patient taking differently: Take 0.8 mg by mouth daily after supper. )   . tiZANidine (ZANAFLEX) 4 MG tablet  09/04/2015: Received from: External Pharmacy  . [DISCONTINUED] cyclobenzaprine (AMRIX) 15 MG 24 hr capsule Take 1 capsule (15 mg total) by mouth daily as needed for muscle spasms.   . [DISCONTINUED] diclofenac (VOLTAREN) 75 MG EC tablet Take 1 tablet (75 mg total) by mouth 2 (two) times daily.   . [DISCONTINUED] Diclofenac Sodium (PENNSAID) 2 % SOLN Place 1 application onto the skin 2 (two) times daily as needed.   . [DISCONTINUED] dicyclomine (BENTYL) 20 MG tablet Take 1 tablet (20 mg total) by mouth every 6 (six) hours as needed for spasms (for abdominal cramping).   . [DISCONTINUED] predniSONE (DELTASONE) 10 MG tablet Prednisone 10 mg: take 4 tabs a day x 3 days; then 3 tabs a day x 4 days; then 2 tabs a day x 4 days, then 1 tab a day x 6 days, then stop. Take pc.    No facility-administered encounter medications on file as of 09/04/2015.     Allergies:  Allergies  Allergen Reactions  . Amoxicillin Nausea And Vomiting  . Lisinopril     Cough   . Penicillins     Per pt: unknown  . Simvastatin     myalgia    Family History: Family History  Problem Relation Age of Onset  . Heart disease Father     Died of MI at age 94  . Breast cancer Mother   . Colon cancer Neg Hx    . Diabetes Paternal Grandmother     Social History: Social History  Substance Use Topics  . Smoking status: Former Smoker    Quit date: 01/20/1981  . Smokeless tobacco: Never Used  . Alcohol Use: 3.9 oz/week    4 Glasses of wine, 3 Standard drinks or equivalent per week   Social History   Social History Narrative    Review of Systems:  CONSTITUTIONAL: No fevers, chills, night sweats, or weight loss.   EYES: No visual changes or eye  pain ENT: No hearing changes.  No history of nose bleeds.   RESPIRATORY: No cough, wheezing and shortness of breath.   CARDIOVASCULAR: Negative for chest pain, and palpitations.   GI: Negative for abdominal discomfort, blood in stools or black stools.  No recent change in bowel habits.   GU:  No history of incontinence.   MUSCLOSKELETAL: No history of joint pain or swelling.  No myalgias.   SKIN: Negative for lesions, rash, and itching.   HEMATOLOGY/ONCOLOGY: Negative for prolonged bleeding, bruising easily, and swollen nodes.  No history of cancer.   ENDOCRINE: Negative for cold or heat intolerance, polydipsia or goiter.   PSYCH:  No depression or anxiety symptoms.   NEURO: As Above.   Vital Signs:  BP 170/80 mmHg  Pulse 84  Ht 5\' 10"  (1.778 m)  Wt 303 lb (137.44 kg)  BMI 43.48 kg/m2  SpO2 95%   General Medical Exam:   General:  Well appearing, comfortable.  Morbidly obese Eyes/ENT: see cranial nerve examination.   Neck: No masses appreciated.  Full range of motion without tenderness.  No carotid bruits. Respiratory:  Clear to auscultation, good air entry bilaterally.   Cardiac:  Regular rate and rhythm, no murmur.   Extremities:  No deformities, edema, or skin discoloration.  Skin:  No rashes or lesions.  Neurological Exam: MENTAL STATUS including orientation to time, place, person, recent and remote memory, attention span and concentration, language, and fund of knowledge is normal.  Speech is not dysarthric.  CRANIAL NERVES: II:   No visual field defects.  Unremarkable fundi.   III-IV-VI: Pupils equal round and reactive to light.  Normal conjugate, extra-ocular eye movements in all directions of gaze.  No nystagmus.  No ptosis.   V:  Normal facial sensation. VII:  Normal facial symmetry and movements.   VIII:  Normal hearing and vestibular function.   IX-X:  Normal palatal movement.   XI:  Normal shoulder shrug and head rotation.   XII:  Normal tongue strength and range of motion, no deviation or fasciculation.  MOTOR:  No atrophy, fasciculations or abnormal movements.  No pronator drift.  Tone is normal.    Right Upper Extremity:    Left Upper Extremity:    Deltoid  5/5   Deltoid  5/5   Biceps  5/5   Biceps  5/5   Triceps  5/5   Triceps  5/5   Wrist extensors  5/5   Wrist extensors  5-/5   Wrist flexors  5/5   Wrist flexors  5/5   Finger extensors  5/5   Finger extensors  5-/5   Finger flexors  5/5   Finger flexors  5-/5   Dorsal interossei  5/5   Dorsal interossei  5-/5   Abductor pollicis  5/5   Abductor pollicis  5/5   Tone (Ashworth scale)  0  Tone (Ashworth scale)  0   Right Lower Extremity:    Left Lower Extremity:    Hip flexors  5/5   Hip flexors  5/5   Hip extensors  5/5   Hip extensors  5/5   Knee flexors  5/5   Knee flexors  5/5   Knee extensors  5/5   Knee extensors  5/5   Dorsiflexors  5/5   Dorsiflexors  5/5   Plantarflexors  5/5   Plantarflexors  5/5   Toe extensors  5/5   Toe extensors  5/5   Toe flexors  5/5   Toe flexors  5/5   Tone (Ashworth scale)  0  Tone (Ashworth scale)  0   MSRs:  Right                                                                 Left brachioradialis 2+  brachioradialis 2+  biceps 2+  biceps 2+  triceps 2+  triceps 2+  patellar 3+  patellar 3+  ankle jerk 1+  ankle jerk 1+  Hoffman no  Hoffman no  plantar response down  plantar response down   SENSORY:  Vibration absent at right great toe and in the left upper extremity. Pin prick and temperature is  reduced over the left palmer surface.  Romberg's sign mildly positive.   COORDINATION/GAIT: Normal finger-to- nose-finger.  Intact rapid alternating movements bilaterally.  Able to rise from a chair without using arms.  Gait narrow based and stable. Tandem and stressed gait intact.    IMPRESSION: 1.  Cervical radiculopathy (?C6-7) causing left arm pain, paresthesias and weakness 2.  History of cervical decompression and fusion at C5-6 3.  Mild peripheral neuropathy of the feet   PLAN/RECOMMENDATIONS:  1.  Start gabapentin 300mg  1 tablet at bedtime for 3 days, then increase to 1 tablet twice daily. 2.  Continue tizanidine 4mg  at bedtime as needed for neck pain 3.  MRI cervical spine without contrast 4.  Based on the results of his MRI, consider PT going forward  Return to clinic 3 months   The duration of this appointment visit was 45 minutes of face-to-face time with the patient.  Greater than 50% of this time was spent in counseling, explanation of diagnosis, planning of further management, and coordination of care.   Thank you for allowing me to participate in patient's care.  If I can answer any additional questions, I would be pleased to do so.    Sincerely,    Donika K. Posey Pronto, DO

## 2015-09-05 NOTE — Addendum Note (Signed)
Addended by: Thurmon Fair on: 09/05/2015 11:26 AM   Modules accepted: Orders

## 2015-09-05 NOTE — Addendum Note (Signed)
Addended by: Thurmon Fair on: 09/05/2015 11:32 AM   Modules accepted: Orders

## 2015-09-10 ENCOUNTER — Encounter: Payer: Self-pay | Admitting: Internal Medicine

## 2015-09-10 ENCOUNTER — Ambulatory Visit (INDEPENDENT_AMBULATORY_CARE_PROVIDER_SITE_OTHER): Payer: Managed Care, Other (non HMO) | Admitting: Internal Medicine

## 2015-09-10 VITALS — BP 150/70 | HR 80 | Wt 304.0 lb

## 2015-09-10 DIAGNOSIS — M5431 Sciatica, right side: Secondary | ICD-10-CM

## 2015-09-10 DIAGNOSIS — M79602 Pain in left arm: Secondary | ICD-10-CM | POA: Diagnosis not present

## 2015-09-10 DIAGNOSIS — I10 Essential (primary) hypertension: Secondary | ICD-10-CM | POA: Diagnosis not present

## 2015-09-10 MED ORDER — NAPROXEN-ESOMEPRAZOLE 500-20 MG PO TBEC: 1.0000 | DELAYED_RELEASE_TABLET | Freq: Two times a day (BID) | ORAL | Status: DC | PRN

## 2015-09-10 MED ORDER — ROSUVASTATIN CALCIUM 20 MG PO TABS: 20.0000 mg | ORAL_TABLET | Freq: Every day | ORAL | Status: DC

## 2015-09-10 MED ORDER — LOSARTAN POTASSIUM 100 MG PO TABS: 100.0000 mg | ORAL_TABLET | Freq: Every day | ORAL | Status: DC

## 2015-09-10 MED ORDER — FUROSEMIDE 40 MG PO TABS: 40.0000 mg | ORAL_TABLET | Freq: Every day | ORAL | Status: DC

## 2015-09-10 MED ORDER — METOPROLOL SUCCINATE ER 25 MG PO TB24: 25.0000 mg | ORAL_TABLET | Freq: Every day | ORAL | Status: DC

## 2015-09-10 MED ORDER — AMLODIPINE BESYLATE 10 MG PO TABS: 10.0000 mg | ORAL_TABLET | Freq: Every day | ORAL | Status: DC

## 2015-09-10 NOTE — Progress Notes (Signed)
Pre visit review using our clinic review tool, if applicable. No additional management support is needed unless otherwise documented below in the visit note. 

## 2015-09-10 NOTE — Assessment & Plan Note (Signed)
L radiculopathy

## 2015-09-10 NOTE — Assessment & Plan Note (Signed)
On Lantus

## 2015-09-10 NOTE — Progress Notes (Signed)
Subjective:  Patient ID: [name] [name], male    DOB: 07/04/1950  Age: 65 y.o. MRN: [id number]  CC: No chief complaint on file.   HPI [name] [name] presents for L sided neck pain (Dr Serita Grit note was reviewed). F/u HTN, DM.  Outpatient Prescriptions Prior to Visit  Medication Sig Dispense Refill  . acyclovir (ZOVIRAX) 800 MG tablet Take 1 tablet (800 mg total) by mouth 5 (five) times daily. 50 tablet 0  . amLODipine (NORVASC) 10 MG tablet Take 1 tablet (10 mg total) by mouth daily. 90 tablet 3  . aspirin 81 MG tablet Take 81 mg by mouth daily.      . B-D ULTRAFINE III SHORT PEN 31G X 8 MM MISC As directed 100 each 3  . chlorpheniramine-HYDROcodone (TUSSIONEX PENNKINETIC ER) 10-8 MG/5ML SUER Take 5 mLs by mouth every 12 (twelve) hours as needed for cough. 140 mL 0  . CIALIS 5 MG tablet Take 1 tablet by mouth daily.    . furosemide (LASIX) 40 MG tablet Take 1 tablet (40 mg total) by mouth daily. 90 tablet 3  . gabapentin (NEURONTIN) 300 MG capsule Take 1 tablet at bedtime for 3 days, then increase to 1 tablet twice daily. 60 capsule 3  . HYDROcodone-acetaminophen (NORCO) 10-325 MG tablet Take 1 tablet by mouth every 8 (eight) hours as needed for severe pain. 90 tablet 0  . Insulin Glargine (LANTUS) 100 UNIT/ML Solostar Pen Inject 150 Units into the skin every morning. 135 mL 3  . losartan (COZAAR) 100 MG tablet Take 1 tablet (100 mg total) by mouth daily. 90 tablet 3  . lubiprostone (AMITIZA) 24 MCG capsule Take 1 capsule (24 mcg total) by mouth 2 (two) times daily with a meal. 60 capsule 11  . metoprolol succinate (TOPROL XL) 25 MG 24 hr tablet Take 1 tablet (25 mg total) by mouth daily. 90 tablet 3  . Naproxen-Esomeprazole 500-20 MG TBEC Take 1 tablet by mouth 2 (two) times daily as needed. 60 tablet 1  . ondansetron (ZOFRAN) 4 MG tablet Take 1 tablet (4 mg total) by mouth every 6 (six) hours as needed for nausea. 65 tablet 0  . rosuvastatin (CRESTOR) 20 MG tablet Take 1 tablet (20 mg  total) by mouth daily. 90 tablet 3  . tamsulosin (FLOMAX) 0.4 MG CAPS capsule Take 1 capsule (0.4 mg total) by mouth daily after supper. (Patient taking differently: Take 0.8 mg by mouth daily after supper. ) 90 capsule 3  . tiZANidine (ZANAFLEX) 4 MG tablet Take 1 tablet (4 mg total) by mouth at bedtime as needed for muscle spasms (and neck pain). 30 tablet 3   No facility-administered medications prior to visit.    ROS Review of Systems  Constitutional: Negative for appetite change, fatigue and unexpected weight change.  HENT: Negative for congestion, nosebleeds, sneezing, sore throat and trouble swallowing.   Eyes: Negative for itching and visual disturbance.  Respiratory: Negative for cough.   Cardiovascular: Negative for chest pain, palpitations and leg swelling.  Gastrointestinal: Negative for nausea, diarrhea, blood in stool and abdominal distention.  Genitourinary: Negative for frequency and hematuria.  Musculoskeletal: Positive for back pain, arthralgias, neck pain and neck stiffness. Negative for joint swelling and gait problem.  Skin: Negative for rash.  Neurological: Negative for dizziness, tremors, speech difficulty and weakness.  Psychiatric/Behavioral: Negative for suicidal ideas, sleep disturbance, dysphoric mood and agitation. The patient is not nervous/anxious.     Objective:  BP 150/70 mmHg  Pulse 80  Wt 304  lb (137.893 kg)  SpO2 97%  BP Readings from Last 3 Encounters:  09/10/15 150/70  09/04/15 170/80  08/13/15 144/62    Wt Readings from Last 3 Encounters:  09/10/15 304 lb (137.893 kg)  09/04/15 303 lb (137.44 kg)  08/13/15 304 lb (137.893 kg)    Physical Exam  Constitutional: He is oriented to person, place, and time. He appears well-developed. No distress.  NAD  HENT:  Mouth/Throat: Oropharynx is clear and moist.  Eyes: Conjunctivae are normal. Pupils are equal, round, and reactive to light.  Neck: Normal range of motion. No JVD present. No  thyromegaly present.  Cardiovascular: Normal rate, regular rhythm, normal heart sounds and intact distal pulses.  Exam reveals no gallop and no friction rub.   No murmur heard. Pulmonary/Chest: Effort normal and breath sounds normal. No respiratory distress. He has no wheezes. He has no rales. He exhibits no tenderness.  Abdominal: Soft. Bowel sounds are normal. He exhibits no distension and no mass. There is no tenderness. There is no rebound and no guarding.  Musculoskeletal: Normal range of motion. He exhibits edema and tenderness.  Lymphadenopathy:    He has no cervical adenopathy.  Neurological: He is alert and oriented to person, place, and time. He has normal reflexes. No cranial nerve deficit. He exhibits normal muscle tone. He displays a negative Romberg sign. Coordination and gait normal.  Skin: Skin is warm and dry. No rash noted.  Psychiatric: He has a normal mood and affect. His behavior is normal. Judgment and thought content normal.  Obese  Lab Results  Component Value Date   WBC 6.5 04/09/2015   HGB 13.7 04/09/2015   HCT 41.0 04/09/2015   PLT 320.0 04/09/2015   GLUCOSE 271* 04/09/2015   CHOL 204* 04/09/2015   TRIG 128.0 04/09/2015   HDL 39.30 04/09/2015   LDLCALC 139* 04/09/2015   ALT 26 04/09/2015   AST 16 04/09/2015   NA 136 04/09/2015   K 4.0 04/09/2015   CL 101 04/09/2015   CREATININE 1.05 04/09/2015   BUN 21 04/09/2015   CO2 28 04/09/2015   TSH 4.15 04/09/2015   PSA 0.65 04/09/2015   INR 1.08 02/02/2011   HGBA1C 8.7* 04/09/2015   MICROALBUR 138.8 Verified by manual dilution.* 01/30/2014    Dg Cervical Spine Complete  07/25/2015  CLINICAL DATA:  Left neck and shoulder pain EXAM: CERVICAL SPINE - COMPLETE 4+ VIEW COMPARISON:  03/07/2008, 02/18/2008 FINDINGS: previous anterior cervical discectomy and fusion at C5-6. Previous posterior fusion spanning C5 through T1. Degenerative disc disease and anterior osteophytes most pronounced at C4 anteriorly. Stable  alignment. No hardware abnormality appreciated. Facets are aligned. Foramina appear patent. Intact odontoid. Clear lung apices. Trachea is midline. Carotid atherosclerosis noted. IMPRESSION: Stable alignment, status post anterior and posterior fusions as above. No acute finding by plain radiography. Electronically Signed   By: Jerilynn Mages.  Shick M.D.   On: 07/25/2015 16:00    Assessment & Plan:   Diagnoses and all orders for this visit:  Essential hypertension -     metoprolol succinate (TOPROL XL) 25 MG 24 hr tablet; Take 1 tablet (25 mg total) by mouth daily.  Other orders -     amLODipine (NORVASC) 10 MG tablet; Take 1 tablet (10 mg total) by mouth daily. -     furosemide (LASIX) 40 MG tablet; Take 1 tablet (40 mg total) by mouth daily. -     losartan (COZAAR) 100 MG tablet; Take 1 tablet (100 mg total) by mouth daily. -  rosuvastatin (CRESTOR) 20 MG tablet; Take 1 tablet (20 mg total) by mouth daily.   I am having Mr. Crone maintain his aspirin, rosuvastatin, ondansetron, tamsulosin, amLODipine, furosemide, metoprolol succinate, losartan, CIALIS, acyclovir, lubiprostone, B-D ULTRAFINE III SHORT PEN, HYDROcodone-acetaminophen, Insulin Glargine, Naproxen-Esomeprazole, chlorpheniramine-HYDROcodone, gabapentin, and tiZANidine.  No orders of the defined types were placed in this encounter.     Follow-up: No Follow-up on file.  Walker Kehr, MD

## 2015-09-10 NOTE — Assessment & Plan Note (Signed)
Occasional sxs 

## 2015-09-10 NOTE — Assessment & Plan Note (Signed)
We discussed options - surgery was discussed.

## 2015-09-25 ENCOUNTER — Telehealth: Payer: Self-pay | Admitting: Neurology

## 2015-09-25 NOTE — Telephone Encounter (Signed)
Order faxed to [facility] 

## 2015-09-25 NOTE — Telephone Encounter (Signed)
Pt called and states that he got a letter from his ins stating the MRI was approved please call and get him sch for the large machine he states and his call back number is [phone] he will be on vacation starting June 2

## 2015-09-25 NOTE — Telephone Encounter (Signed)
Pt called and states that he got a letter from his ins stating that the MRI was approved please call and get him sch for the Large Machine per patient his call back number is [phone] he will be on vacation starting June 2

## 2015-10-03 ENCOUNTER — Telehealth: Payer: Self-pay | Admitting: Neurology

## 2015-10-03 MED ORDER — GABAPENTIN 300 MG PO CAPS: 600.0000 mg | ORAL_CAPSULE | Freq: Two times a day (BID) | ORAL | Status: DC

## 2015-10-03 NOTE — Telephone Encounter (Signed)
Please advise 

## 2015-10-03 NOTE — Telephone Encounter (Signed)
MRI cervical spine wo contrast 10/02/2015:  1. Post-operative changes at C5-6.  No obvious acute or chronic complication. 2. Moderate central stenosis at C2-3 similar to posterior soft tissue hypertrophy.  Less severe central stenosis at C4-5 3. Small disc bulge protrusion at T1-2.  Results discussed with patient.  Recommend optimizing gabapentin for pain control - increase to 600mg  twice daily and start neck PT.  [name] Snowball K. Posey Pronto, DO

## 2015-10-03 NOTE — Telephone Encounter (Signed)
I do not have the results back yet - I believe he had the MRI yesterday.  Can you call [facility] for the MRI report?

## 2015-10-03 NOTE — Telephone Encounter (Signed)
Report on your desk

## 2015-10-03 NOTE — Telephone Encounter (Signed)
Pt had a MRI and would like the results [phone]

## 2015-10-07 NOTE — Telephone Encounter (Signed)
Referral sent 

## 2015-10-30 ENCOUNTER — Other Ambulatory Visit: Payer: Self-pay | Admitting: Family

## 2015-10-31 MED ORDER — DICLOFENAC SODIUM 2 % TD SOLN: TRANSDERMAL | Status: DC

## 2015-10-31 NOTE — Addendum Note (Signed)
Addended by: Earnstine Regal on: 10/31/2015 03:55 PM   Modules accepted: Orders

## 2015-10-31 NOTE — Telephone Encounter (Signed)
Sent fax through computer...Johny Chess

## 2015-12-17 ENCOUNTER — Encounter: Payer: Self-pay | Admitting: Neurology

## 2015-12-17 ENCOUNTER — Ambulatory Visit: Payer: Managed Care, Other (non HMO) | Admitting: Internal Medicine

## 2015-12-17 ENCOUNTER — Ambulatory Visit (INDEPENDENT_AMBULATORY_CARE_PROVIDER_SITE_OTHER): Payer: Managed Care, Other (non HMO) | Admitting: Neurology

## 2015-12-17 VITALS — BP 150/90 | HR 80 | Ht 70.0 in | Wt 308.1 lb

## 2015-12-17 DIAGNOSIS — M5412 Radiculopathy, cervical region: Secondary | ICD-10-CM

## 2015-12-17 DIAGNOSIS — R202 Paresthesia of skin: Secondary | ICD-10-CM

## 2015-12-17 DIAGNOSIS — M4802 Spinal stenosis, cervical region: Secondary | ICD-10-CM

## 2015-12-17 MED ORDER — GABAPENTIN 600 MG PO TABS
600.0000 mg | ORAL_TABLET | Freq: Two times a day (BID) | ORAL | 5 refills | Status: DC
Start: 2015-12-17 — End: 2016-06-21

## 2015-12-17 MED ORDER — CYCLOBENZAPRINE HCL 5 MG PO TABS: 5.0000 mg | ORAL_TABLET | Freq: Every day | ORAL | 5 refills | Status: DC

## 2015-12-17 NOTE — Progress Notes (Signed)
Follow-up Visit   Date: 12/17/15    [name] [name] MRN: [id number] DOB: 27-Sep-1950   Interim History: [name] [name] is a 65 y.o. right-handed Caucasian male with hypertension, hyperlipidemia, insulin-dependent diabetes mellitus type 2, OSA, s/p cervical decompression and fusion at C5-6 returning to the clinic for follow-up of left arm pain due to cervical canal stenosis.  The patient was accompanied to the clinic by self.  History of present illness: Starting around the end of January 2017, he began noticing right hip pain and was given prednisone which helped.  He travelled to [place] and when he awoke in the morning at the hotel, he developed severe and sharp left upper arm and forearm pain. He has no sensation of his index and left middle finger on the left.  Pain is much worse when he is supine and has found that raising his arm provides some relief.  He has noticed increased weakness of his left hand and often drops objects.  He has tried prednisone, gabapentin, and NSAIDs which helps some, but does not completely alleviate the pain.   He endorses suffering a mechanical fall in December 2016, there was no loss of consciousness or neck pain, but is concern that this may have contributed to his new arm pain.   He has history of cervical decompression and fusion x 2 previously for bilateral upper extremity pain and paresthesias.     UPDATE 12/17/2015:  He is taking gabapentin 300mg  in the morning and 600mg  at bedtime which helps some, but has has noticed greater benefit with Vimovo, which was ordered by his PCP.   He is sleeping better since taking both these medications.  Overall, pain and tingling is better than before, but he continues to have discomfort at night which prevents him from sleeping and often has to raise his arm above his head.  MRI cervical spine shows moderate canal stenosis at C2-3 and C4-5.  Medications:  Current Outpatient Prescriptions on File Prior to Visit    Medication Sig Dispense Refill  . acyclovir (ZOVIRAX) 800 MG tablet Take 1 tablet (800 mg total) by mouth 5 (five) times daily. 50 tablet 0  . amLODipine (NORVASC) 10 MG tablet Take 1 tablet (10 mg total) by mouth daily. 90 tablet 3  . aspirin 81 MG tablet Take 81 mg by mouth daily.      . B-D ULTRAFINE III SHORT PEN 31G X 8 MM MISC As directed 100 each 3  . chlorpheniramine-HYDROcodone (TUSSIONEX PENNKINETIC ER) 10-8 MG/5ML SUER Take 5 mLs by mouth every 12 (twelve) hours as needed for cough. 140 mL 0  . CIALIS 5 MG tablet Take 1 tablet by mouth daily.    . Diclofenac Sodium (PENNSAID) 2 % SOLN Place 1 application onto the skin 2 (two) times daily as needed. 112 g 2  . furosemide (LASIX) 40 MG tablet Take 1 tablet (40 mg total) by mouth daily. 90 tablet 3  . HYDROcodone-acetaminophen (NORCO) 10-325 MG tablet Take 1 tablet by mouth every 8 (eight) hours as needed for severe pain. 90 tablet 0  . Insulin Glargine (LANTUS) 100 UNIT/ML Solostar Pen Inject 150 Units into the skin every morning. 135 mL 3  . losartan (COZAAR) 100 MG tablet Take 1 tablet (100 mg total) by mouth daily. 90 tablet 3  . lubiprostone (AMITIZA) 24 MCG capsule Take 1 capsule (24 mcg total) by mouth 2 (two) times daily with a meal. 60 capsule 11  . metoprolol succinate (TOPROL XL) 25 MG 24  hr tablet Take 1 tablet (25 mg total) by mouth daily. 90 tablet 3  . Naproxen-Esomeprazole 500-20 MG TBEC Take 1 tablet by mouth 2 (two) times daily as needed. 180 tablet 2  . ondansetron (ZOFRAN) 4 MG tablet Take 1 tablet (4 mg total) by mouth every 6 (six) hours as needed for nausea. 65 tablet 0  . rosuvastatin (CRESTOR) 20 MG tablet Take 1 tablet (20 mg total) by mouth daily. 90 tablet 3  . tamsulosin (FLOMAX) 0.4 MG CAPS capsule Take 1 capsule (0.4 mg total) by mouth daily after supper. (Patient taking differently: Take 0.8 mg by mouth daily after supper. ) 90 capsule 3  . tiZANidine (ZANAFLEX) 4 MG tablet Take 1 tablet (4 mg total) by  mouth at bedtime as needed for muscle spasms (and neck pain). 30 tablet 3   No current facility-administered medications on file prior to visit.     Allergies:  Allergies  Allergen Reactions  . Amoxicillin Nausea And Vomiting  . Lisinopril     Cough   . Penicillins     Per pt: unknown  . Simvastatin     myalgia    Review of Systems:  CONSTITUTIONAL: No fevers, chills, night sweats, or weight loss.  EYES: No visual changes or eye pain ENT: No hearing changes.  No history of nose bleeds.   RESPIRATORY: No cough, wheezing and shortness of breath.   CARDIOVASCULAR: Negative for chest pain, and palpitations.   GI: Negative for abdominal discomfort, blood in stools or black stools.  No recent change in bowel habits.   GU:  No history of incontinence.   MUSCLOSKELETAL: No history of joint pain or swelling.  No myalgias.   SKIN: Negative for lesions, rash, and itching.   ENDOCRINE: Negative for cold or heat intolerance, polydipsia or goiter.   PSYCH:  No depression or anxiety symptoms.   NEURO: As Above.   Vital Signs:  BP (!) 150/90   Pulse 80   Ht 5\' 10"  (1.778 m)   Wt (!) 308 lb 2 oz (139.8 kg)   SpO2 93%   BMI 44.21 kg/m   Neurological Exam: MENTAL STATUS including orientation to time, place, person, recent and remote memory, attention span and concentration, language, and fund of knowledge is normal.  Speech is not dysarthric.  CRANIAL NERVES: Pupils equal round and reactive to light.    Face is symmetric.   MOTOR:  Motor strength is 5/5 in all extremities (improved).  Tone is normal.    MSRs:  Reflexes are 2+/4 in the upper extremities.  SENSORY:  Intact to vibration in the upper extremities  COORDINATION/GAIT:    Wide-based due to body habitus, stable and unassisted.  Data: MRI cervical spine wo contrast 10/02/2015:  1. Post-operative changes at C5-6.  No obvious acute or chronic complication. 2. Moderate central stenosis at C2-3 due to posterior soft tissue  hypertrophy.  Less severe central stenosis at C4-5 3. Small disc bulge protrusion at T1-2.  IMPRESSION: 1.  Cervical canal stenosis at C2-3 and C4-5 causing left arm pain and paresthesias.  Weakness has improved on today's exam, but nighttime discomfort remains unchanged.   2.  History of cervical decompression and fusion at C5-6 3.  Mild peripheral neuropathy of the feet 5.  Morbid obesity  PLAN/RECOMMENDATIONS:  1.  Start physical therapy for left arm pain  2.  Increase gabapentin to 600mg  twice daily 3.  Start flexeril 5mg  at bedtime 4.  Weight loss and healthy diet encouraged  Return to clinic in 6 months  The duration of this appointment visit was 25 minutes of face-to-face time with the patient.  Greater than 50% of this time was spent in counseling, explanation of diagnosis, planning of further management, and coordination of care.   Thank you for allowing me to participate in patient's care.  If I can answer any additional questions, I would be pleased to do so.    Sincerely,    Keymoni Mccaster K. Posey Pronto, DO

## 2015-12-17 NOTE — Patient Instructions (Addendum)
1.  We will refer you to physical therapy close to [facility] 2.  Increase gabapentin to 600mg  twice daily 3.  Start flexeril 5mg  at bedtime 4.  Return to clinic in 6 months

## 2015-12-31 ENCOUNTER — Ambulatory Visit (INDEPENDENT_AMBULATORY_CARE_PROVIDER_SITE_OTHER): Payer: Managed Care, Other (non HMO) | Admitting: Internal Medicine

## 2015-12-31 ENCOUNTER — Encounter: Payer: Self-pay | Admitting: Internal Medicine

## 2015-12-31 VITALS — BP 158/72 | HR 83 | Temp 98.8°F | Wt 308.0 lb

## 2015-12-31 DIAGNOSIS — Z794 Long term (current) use of insulin: Secondary | ICD-10-CM

## 2015-12-31 DIAGNOSIS — I2510 Atherosclerotic heart disease of native coronary artery without angina pectoris: Secondary | ICD-10-CM | POA: Diagnosis not present

## 2015-12-31 DIAGNOSIS — Z23 Encounter for immunization: Secondary | ICD-10-CM

## 2015-12-31 DIAGNOSIS — E1165 Type 2 diabetes mellitus with hyperglycemia: Secondary | ICD-10-CM | POA: Diagnosis not present

## 2015-12-31 DIAGNOSIS — I517 Cardiomegaly: Secondary | ICD-10-CM

## 2015-12-31 DIAGNOSIS — Z0000 Encounter for general adult medical examination without abnormal findings: Secondary | ICD-10-CM

## 2015-12-31 DIAGNOSIS — I10 Essential (primary) hypertension: Secondary | ICD-10-CM

## 2015-12-31 MED ORDER — TAMSULOSIN HCL 0.4 MG PO CAPS: 0.4000 mg | ORAL_CAPSULE | Freq: Every day | ORAL | 11 refills | Status: DC

## 2015-12-31 MED ORDER — AMLODIPINE BESYLATE 5 MG PO TABS: 5.0000 mg | ORAL_TABLET | Freq: Every day | ORAL | 1 refills | Status: DC

## 2015-12-31 MED ORDER — LOSARTAN POTASSIUM 100 MG PO TABS: 100.0000 mg | ORAL_TABLET | Freq: Every day | ORAL | 11 refills | Status: DC

## 2015-12-31 MED ORDER — METOPROLOL SUCCINATE ER 25 MG PO TB24: 25.0000 mg | ORAL_TABLET | Freq: Every day | ORAL | 11 refills | Status: DC

## 2015-12-31 MED ORDER — FUROSEMIDE 40 MG PO TABS: 40.0000 mg | ORAL_TABLET | Freq: Every day | ORAL | 3 refills | Status: DC

## 2015-12-31 MED ORDER — BD PEN NEEDLE SHORT U/F 31G X 8 MM MISC: 3 refills | Status: DC

## 2015-12-31 MED ORDER — AMLODIPINE BESYLATE 5 MG PO TABS: 5.0000 mg | ORAL_TABLET | Freq: Every day | ORAL | 11 refills | Status: DC

## 2015-12-31 MED ORDER — INSULIN GLARGINE 100 UNIT/ML SOLOSTAR PEN: 150.0000 [IU] | PEN_INJECTOR | SUBCUTANEOUS | 11 refills | Status: DC

## 2015-12-31 MED ORDER — HYDROCOD POLST-CPM POLST ER 10-8 MG/5ML PO SUER: 5.0000 mL | Freq: Two times a day (BID) | ORAL | 0 refills | Status: DC | PRN

## 2015-12-31 NOTE — Progress Notes (Signed)
Subjective:  Patient ID: [name] [name], male    DOB: 10-Apr-1951  Age: 65 y.o. MRN: [id number]  CC: No chief complaint on file.   HPI [name] [name] presents for L shoulder pain - not better; DM, CAD f/u  Outpatient Medications Prior to Visit  Medication Sig Dispense Refill  . acyclovir (ZOVIRAX) 800 MG tablet Take 1 tablet (800 mg total) by mouth 5 (five) times daily. 50 tablet 0  . amLODipine (NORVASC) 10 MG tablet Take 1 tablet (10 mg total) by mouth daily. 90 tablet 3  . aspirin 81 MG tablet Take 81 mg by mouth daily.      . B-D ULTRAFINE III SHORT PEN 31G X 8 MM MISC As directed 100 each 3  . chlorpheniramine-HYDROcodone (TUSSIONEX PENNKINETIC ER) 10-8 MG/5ML SUER Take 5 mLs by mouth every 12 (twelve) hours as needed for cough. 140 mL 0  . CIALIS 5 MG tablet Take 1 tablet by mouth daily.    . cyclobenzaprine (FLEXERIL) 5 MG tablet Take 1 tablet (5 mg total) by mouth at bedtime. 30 tablet 5  . Diclofenac Sodium (PENNSAID) 2 % SOLN Place 1 application onto the skin 2 (two) times daily as needed. 112 g 2  . furosemide (LASIX) 40 MG tablet Take 1 tablet (40 mg total) by mouth daily. 90 tablet 3  . gabapentin (NEURONTIN) 600 MG tablet Take 1 tablet (600 mg total) by mouth 2 (two) times daily. 60 tablet 5  . HYDROcodone-acetaminophen (NORCO) 10-325 MG tablet Take 1 tablet by mouth every 8 (eight) hours as needed for severe pain. 90 tablet 0  . hydrocortisone 2.5 % cream     . Insulin Glargine (LANTUS) 100 UNIT/ML Solostar Pen Inject 150 Units into the skin every morning. 135 mL 3  . losartan (COZAAR) 100 MG tablet Take 1 tablet (100 mg total) by mouth daily. 90 tablet 3  . lubiprostone (AMITIZA) 24 MCG capsule Take 1 capsule (24 mcg total) by mouth 2 (two) times daily with a meal. 60 capsule 11  . metoprolol succinate (TOPROL XL) 25 MG 24 hr tablet Take 1 tablet (25 mg total) by mouth daily. 90 tablet 3  . Naproxen-Esomeprazole 500-20 MG TBEC Take 1 tablet by mouth 2 (two) times daily  as needed. 180 tablet 2  . ondansetron (ZOFRAN) 4 MG tablet Take 1 tablet (4 mg total) by mouth every 6 (six) hours as needed for nausea. 65 tablet 0  . rosuvastatin (CRESTOR) 20 MG tablet Take 1 tablet (20 mg total) by mouth daily. 90 tablet 3  . tamsulosin (FLOMAX) 0.4 MG CAPS capsule Take 1 capsule (0.4 mg total) by mouth daily after supper. (Patient taking differently: Take 0.8 mg by mouth daily after supper. ) 90 capsule 3  . tiZANidine (ZANAFLEX) 4 MG tablet Take 1 tablet (4 mg total) by mouth at bedtime as needed for muscle spasms (and neck pain). 30 tablet 3   No facility-administered medications prior to visit.     ROS Review of Systems  Constitutional: Positive for fatigue. Negative for appetite change and unexpected weight change.  HENT: Negative for congestion, nosebleeds, sneezing, sore throat and trouble swallowing.   Eyes: Negative for itching and visual disturbance.  Respiratory: Negative for cough.   Cardiovascular: Negative for chest pain, palpitations and leg swelling.  Gastrointestinal: Negative for abdominal distention, blood in stool, diarrhea and nausea.  Genitourinary: Negative for frequency and hematuria.  Musculoskeletal: Positive for arthralgias, back pain, neck pain and neck stiffness. Negative for gait problem and  joint swelling.  Skin: Negative for rash.  Neurological: Negative for dizziness, tremors, speech difficulty and weakness.  Psychiatric/Behavioral: Negative for agitation, dysphoric mood, sleep disturbance and suicidal ideas. The patient is not nervous/anxious.     Objective:  BP (!) 158/72   Pulse 83   Temp 98.8 F (37.1 C) (Oral)   Wt (!) 308 lb (139.7 kg)   SpO2 94%   BMI 44.19 kg/m   BP Readings from Last 3 Encounters:  12/31/15 (!) 158/72  12/17/15 (!) 150/90  09/10/15 (!) 150/70    Wt Readings from Last 3 Encounters:  12/31/15 (!) 308 lb (139.7 kg)  12/17/15 (!) 308 lb 2 oz (139.8 kg)  09/10/15 (!) 304 lb (137.9 kg)    Physical  Exam  Constitutional: He is oriented to person, place, and time. He appears well-developed. No distress.  NAD  HENT:  Mouth/Throat: Oropharynx is clear and moist.  Eyes: Conjunctivae are normal. Pupils are equal, round, and reactive to light.  Neck: Normal range of motion. No JVD present. No thyromegaly present.  Cardiovascular: Normal rate, regular rhythm, normal heart sounds and intact distal pulses.  Exam reveals no gallop and no friction rub.   No murmur heard. Pulmonary/Chest: Effort normal and breath sounds normal. No respiratory distress. He has no wheezes. He has no rales. He exhibits no tenderness.  Abdominal: Soft. Bowel sounds are normal. He exhibits no distension and no mass. There is no tenderness. There is no rebound and no guarding.  Musculoskeletal: Normal range of motion. He exhibits no edema or tenderness.  Lymphadenopathy:    He has no cervical adenopathy.  Neurological: He is alert and oriented to person, place, and time. He has normal reflexes. No cranial nerve deficit. He exhibits normal muscle tone. He displays a negative Romberg sign. Coordination and gait normal.  Skin: Skin is warm and dry. No rash noted.  Psychiatric: He has a normal mood and affect. His behavior is normal. Judgment and thought content normal.  1+ edema Neck, shoulder - tender  Lab Results  Component Value Date   WBC 6.5 04/09/2015   HGB 13.7 04/09/2015   HCT 41.0 04/09/2015   PLT 320.0 04/09/2015   GLUCOSE 271 (H) 04/09/2015   CHOL 204 (H) 04/09/2015   TRIG 128.0 04/09/2015   HDL 39.30 04/09/2015   LDLCALC 139 (H) 04/09/2015   ALT 26 04/09/2015   AST 16 04/09/2015   NA 136 04/09/2015   K 4.0 04/09/2015   CL 101 04/09/2015   CREATININE 1.05 04/09/2015   BUN 21 04/09/2015   CO2 28 04/09/2015   TSH 4.15 04/09/2015   PSA 0.65 04/09/2015   INR 1.08 02/02/2011   HGBA1C 8.7 (H) 04/09/2015   MICROALBUR 138.8 Verified by manual dilution. (H) 01/30/2014    Dg Cervical Spine  Complete  Result Date: 07/25/2015 CLINICAL DATA:  Left neck and shoulder pain EXAM: CERVICAL SPINE - COMPLETE 4+ VIEW COMPARISON:  03/07/2008, 02/18/2008 FINDINGS: previous anterior cervical discectomy and fusion at C5-6. Previous posterior fusion spanning C5 through T1. Degenerative disc disease and anterior osteophytes most pronounced at C4 anteriorly. Stable alignment. No hardware abnormality appreciated. Facets are aligned. Foramina appear patent. Intact odontoid. Clear lung apices. Trachea is midline. Carotid atherosclerosis noted. IMPRESSION: Stable alignment, status post anterior and posterior fusions as above. No acute finding by plain radiography. Electronically Signed   By: Jerilynn Mages.  Shick M.D.   On: 07/25/2015 16:00    Assessment & Plan:   Diagnoses and all orders for this visit:  Essential hypertension -     metoprolol succinate (TOPROL XL) 25 MG 24 hr tablet; Take 1 tablet (25 mg total) by mouth daily.  Other orders -     amLODipine (NORVASC) 10 MG tablet; Take 1 tablet (10 mg total) by mouth daily. -     furosemide (LASIX) 40 MG tablet; Take 1 tablet (40 mg total) by mouth daily. -     chlorpheniramine-HYDROcodone (TUSSIONEX PENNKINETIC ER) 10-8 MG/5ML SUER; Take 5 mLs by mouth every 12 (twelve) hours as needed for cough. -     Insulin Glargine (LANTUS) 100 UNIT/ML Solostar Pen; Inject 150 Units into the skin every morning. -     losartan (COZAAR) 100 MG tablet; Take 1 tablet (100 mg total) by mouth daily. -     tamsulosin (FLOMAX) 0.4 MG CAPS capsule; Take 1 capsule (0.4 mg total) by mouth daily after supper. -     B-D ULTRAFINE III SHORT PEN 31G X 8 MM MISC; As directed   I am having Mr. Gochnour maintain his aspirin, ondansetron, tamsulosin, CIALIS, acyclovir, lubiprostone, B-D ULTRAFINE III SHORT PEN, HYDROcodone-acetaminophen, Insulin Glargine, chlorpheniramine-HYDROcodone, tiZANidine, amLODipine, furosemide, losartan, metoprolol succinate, rosuvastatin, Naproxen-Esomeprazole,  Diclofenac Sodium, hydrocortisone, cyclobenzaprine, and gabapentin.  No orders of the defined types were placed in this encounter.    Follow-up: No Follow-up on file.  Walker Kehr, MD

## 2015-12-31 NOTE — Assessment & Plan Note (Signed)
Toprol XL, ASA, Amlodipine, Crestor 

## 2015-12-31 NOTE — Patient Instructions (Addendum)
Stomach baloon for weight loss GERD wedge pillow

## 2015-12-31 NOTE — Assessment & Plan Note (Signed)
On Lantus Wt loss

## 2015-12-31 NOTE — Progress Notes (Signed)
Pre visit review using our clinic review tool, if applicable. No additional management support is needed unless otherwise documented below in the visit note. 

## 2015-12-31 NOTE — Assessment & Plan Note (Signed)
Amlodipine, Furosemide, Toprol

## 2015-12-31 NOTE — Assessment & Plan Note (Signed)
Stomach baloon discussed 8/17

## 2016-02-09 ENCOUNTER — Other Ambulatory Visit: Payer: Self-pay | Admitting: Internal Medicine

## 2016-02-12 ENCOUNTER — Encounter (HOSPITAL_COMMUNITY): Payer: Self-pay | Admitting: *Deleted

## 2016-02-12 ENCOUNTER — Emergency Department (HOSPITAL_COMMUNITY)
Admission: EM | Admit: 2016-02-12 | Discharge: 2016-02-12 | Disposition: A | Discharge status: Home / Self Care | Payer: Managed Care, Other (non HMO) | Attending: Emergency Medicine | Admitting: Emergency Medicine

## 2016-02-12 DIAGNOSIS — E860 Dehydration: Secondary | ICD-10-CM | POA: Diagnosis not present

## 2016-02-12 DIAGNOSIS — I2510 Atherosclerotic heart disease of native coronary artery without angina pectoris: Secondary | ICD-10-CM | POA: Insufficient documentation

## 2016-02-12 DIAGNOSIS — E876 Hypokalemia: Secondary | ICD-10-CM | POA: Diagnosis not present

## 2016-02-12 DIAGNOSIS — Z7982 Long term (current) use of aspirin: Secondary | ICD-10-CM | POA: Diagnosis not present

## 2016-02-12 DIAGNOSIS — M79605 Pain in left leg: Secondary | ICD-10-CM | POA: Diagnosis present

## 2016-02-12 DIAGNOSIS — M62838 Other muscle spasm: Secondary | ICD-10-CM | POA: Diagnosis not present

## 2016-02-12 DIAGNOSIS — Z87891 Personal history of nicotine dependence: Secondary | ICD-10-CM | POA: Diagnosis not present

## 2016-02-12 DIAGNOSIS — Z794 Long term (current) use of insulin: Secondary | ICD-10-CM | POA: Insufficient documentation

## 2016-02-12 DIAGNOSIS — I10 Essential (primary) hypertension: Secondary | ICD-10-CM | POA: Diagnosis not present

## 2016-02-12 DIAGNOSIS — E119 Type 2 diabetes mellitus without complications: Secondary | ICD-10-CM | POA: Insufficient documentation

## 2016-02-12 LAB — PHOSPHORUS: Phosphorus: 1.8 mg/dL — ABNORMAL LOW (ref 2.5–4.6)

## 2016-02-12 LAB — CBC
HCT: 45.1 % (ref 39.0–52.0)
Hemoglobin: 14.6 g/dL (ref 13.0–17.0)
MCH: 25.4 pg — AB (ref 26.0–34.0)
MCHC: 32.4 g/dL (ref 30.0–36.0)
MCV: 78.6 fL (ref 78.0–100.0)
PLATELETS: 299 10*3/uL (ref 150–400)
RBC: 5.74 MIL/uL (ref 4.22–5.81)
RDW: 14.9 % (ref 11.5–15.5)
WBC: 8.5 10*3/uL (ref 4.0–10.5)

## 2016-02-12 LAB — I-STAT CHEM 8, ED
BUN: 23 mg/dL — AB (ref 6–20)
CHLORIDE: 103 mmol/L (ref 101–111)
Calcium, Ion: 1.16 mmol/L (ref 1.15–1.40)
Creatinine, Ser: 1.3 mg/dL — ABNORMAL HIGH (ref 0.61–1.24)
Glucose, Bld: 158 mg/dL — ABNORMAL HIGH (ref 65–99)
HCT: 45 % (ref 39.0–52.0)
Hemoglobin: 15.3 g/dL (ref 13.0–17.0)
Potassium: 3.2 mmol/L — ABNORMAL LOW (ref 3.5–5.1)
SODIUM: 143 mmol/L (ref 135–145)
TCO2: 25 mmol/L (ref 0–100)

## 2016-02-12 LAB — BASIC METABOLIC PANEL
Anion gap: 17 — ABNORMAL HIGH (ref 5–15)
BUN: 20 mg/dL (ref 6–20)
CALCIUM: 9.6 mg/dL (ref 8.9–10.3)
CO2: 24 mmol/L (ref 22–32)
Chloride: 101 mmol/L (ref 101–111)
Creatinine, Ser: 1.45 mg/dL — ABNORMAL HIGH (ref 0.61–1.24)
GFR calc Af Amer: 57 mL/min — ABNORMAL LOW (ref >60)
GFR, EST NON AFRICAN AMERICAN: 49 mL/min — AB (ref >60)
GLUCOSE: 156 mg/dL — AB (ref 65–99)
Potassium: 3.2 mmol/L — ABNORMAL LOW (ref 3.5–5.1)
Sodium: 142 mmol/L (ref 135–145)

## 2016-02-12 LAB — CK: Total CK: 124 U/L (ref 49–397)

## 2016-02-12 LAB — MAGNESIUM: Magnesium: 1.7 mg/dL (ref 1.7–2.4)

## 2016-02-12 MED ORDER — METHOCARBAMOL 1000 MG/10ML IJ SOLN
1000.0000 mg | Freq: Once | INTRAVENOUS | Status: AC
  Administered 2016-02-12: 1000 mg via INTRAVENOUS
  Filled 2016-02-12: qty 10

## 2016-02-12 MED ORDER — HYDROCODONE-ACETAMINOPHEN 5-325 MG PO TABS
1.0000 | ORAL_TABLET | Freq: Once | ORAL | Status: AC
  Administered 2016-02-12: 1 via ORAL
  Filled 2016-02-12: qty 1

## 2016-02-12 MED ORDER — DIAZEPAM 5 MG PO TABS
5.0000 mg | ORAL_TABLET | Freq: Once | ORAL | Status: AC
  Administered 2016-02-12: 5 mg via ORAL
  Filled 2016-02-12: qty 1

## 2016-02-12 MED ORDER — HYDROCODONE-ACETAMINOPHEN 5-325 MG PO TABS: 1.0000 | ORAL_TABLET | Freq: Four times a day (QID) | ORAL | 0 refills | Status: DC | PRN

## 2016-02-12 MED ORDER — DIAZEPAM 5 MG PO TABS: 5.0000 mg | ORAL_TABLET | Freq: Four times a day (QID) | ORAL | 0 refills | Status: DC | PRN

## 2016-02-12 MED ORDER — POTASSIUM CHLORIDE 10 MEQ/100ML IV SOLN
10.0000 meq | Freq: Once | INTRAVENOUS | Status: AC
  Administered 2016-02-12: 10 meq via INTRAVENOUS
  Filled 2016-02-12: qty 100

## 2016-02-12 MED ORDER — SODIUM CHLORIDE 0.9 % IV BOLUS (SEPSIS): 1000.0000 mL | Freq: Once | INTRAVENOUS | Status: DC

## 2016-02-12 MED ORDER — POTASSIUM PHOSPHATE MONOBASIC 500 MG PO TABS
1000.0000 mg | ORAL_TABLET | Freq: Once | ORAL | Status: AC
  Administered 2016-02-12: 1000 mg via ORAL
  Filled 2016-02-12: qty 2

## 2016-02-12 MED ORDER — POTASSIUM CHLORIDE CRYS ER 20 MEQ PO TBCR: 40.0000 meq | EXTENDED_RELEASE_TABLET | Freq: Once | ORAL | Status: DC

## 2016-02-12 MED ORDER — POTASSIUM CHLORIDE CRYS ER 20 MEQ PO TBCR: 40.0000 meq | EXTENDED_RELEASE_TABLET | Freq: Every day | ORAL | 0 refills | Status: DC

## 2016-02-12 MED ORDER — POTASSIUM CHLORIDE CRYS ER 20 MEQ PO TBCR
40.0000 meq | EXTENDED_RELEASE_TABLET | Freq: Once | ORAL | Status: AC
  Administered 2016-02-12: 40 meq via ORAL
  Filled 2016-02-12: qty 2

## 2016-02-12 MED ORDER — LACTATED RINGERS IV BOLUS (SEPSIS)
500.0000 mL | Freq: Once | INTRAVENOUS | Status: AC
  Administered 2016-02-12: 500 mL via INTRAVENOUS

## 2016-02-12 MED ORDER — SODIUM CHLORIDE 0.9 % IV BOLUS (SEPSIS)
500.0000 mL | Freq: Once | INTRAVENOUS | Status: AC
  Administered 2016-02-12: 500 mL via INTRAVENOUS

## 2016-02-12 MED ORDER — METHOCARBAMOL 1000 MG/10ML IJ SOLN: 1000.0000 mg | Freq: Once | INTRAMUSCULAR | Status: DC

## 2016-02-12 MED ORDER — DIAZEPAM 5 MG/ML IJ SOLN
5.0000 mg | Freq: Once | INTRAMUSCULAR | Status: AC
  Administered 2016-02-12: 5 mg via INTRAVENOUS
  Filled 2016-02-12: qty 2

## 2016-02-12 NOTE — ED Triage Notes (Signed)
Pt to ED by EMS c/o numbness and muscle cramping in all extremities, worsening in BLE. Pt woke up at 0445 had L hand and L leg numbness with extreme muscle contractions in hands and leg. Pt's has hx of neck surgery with a flare up and has been doing physical therapy. EMS gave 175mcg fentanyl and 4mg  zofran enroute. Dr.Isaacs at bedside, does not want to call a code stroke at this time

## 2016-02-12 NOTE — ED Provider Notes (Signed)
[facility] DEPT Provider Note   CSN: [id number] Arrival date & time: 02/12/16  [id number]     History   Chief Complaint Chief Complaint  Patient presents with  . Spasms  . Numbness    HPI [name] [name] is a 65 y.o. male.  HPI 65 year old male with past medical history of hypertension hyperlipidemia diabetes and chronic cervical disc disease and left shoulder bursitis who presents with bilateral hand and leg pain and subjective numbness. Patient states he was in his usual state of health yesterday. He did undergo a long physical therapy session and which she worked with his arms and legs. He woke up at 445 this morning and took his dog out. While walking he experienced acute onset of severe pain and muscle cramping in his left calf. He then began to feel cramping in his bilateral hands and was unable to move them. He states they were contracted. Since then he has had intermittent cramping in bilateral upper and lower extremities with subjective tingling in his extremities when they are cramping. Denies any other associated persistent numbness or weakness denies any vision changes denies any recent medication changes. No recent dietary changes. Pain is made worse with any movement of his extremities. He also endorses intermittent back spasms. No loss of bowel or bladder function  Past Medical History:  Diagnosis Date  . Bursitis of left shoulder   . Cervical disc disease   . Diabetes mellitus, type 2 ([place])   . Diverticulosis of colon   . Erectile dysfunction   . Exogenous obesity   . History of anemia   . Hyperlipidemia   . Hypertension   . OSA (obstructive sleep apnea)    cpap  . Testicular hypofunction     Patient Active Problem List   Diagnosis Date Noted  . Pain, upper extremity 07/25/2015  . Constipation 06/26/2015  . LVH (left ventricular hypertrophy) 05/21/2015  . Pulmonary hypertension due to sleep-disordered breathing 05/21/2015  . Sciatic nerve pain 12/18/2014  .  Right ureteral stone 08/24/2014  . Right flank pain 08/24/2014  . Dizzinesses 07/31/2014  . Cough 07/31/2014  . Abdominal pain, LLQ 06/23/2014  . Kidney stones 06/17/2014  . Diabetes ([place]) 03/13/2014  . Well adult exam 11/22/2013  . Coronary atherosclerosis of native coronary artery 07/19/2012  . Dyspnea 11/09/2010  . ANEMIA 02/15/2010  . DIVERTICULOSIS OF COLON 02/15/2010  . Obstructive sleep apnea 01/22/2010  . TESTICULAR HYPOFUNCTION 11/27/2008  . ERECTILE DYSFUNCTION, ORGANIC 11/27/2008  . Diabetes mellitus type 2, uncontrolled ([place]) 12/14/2007  . Obesity, morbid ([place]) 12/14/2007  . [place] DISEASE, CERVICAL 07/17/2007  . BURSITIS, LEFT SHOULDER 07/17/2007  . HYPERLIPIDEMIA 10/07/2006  . Essential hypertension 10/07/2006    Past Surgical History:  Procedure Laterality Date  . POSTERIOR LAMINECTOMY / DECOMPRESSION CERVICAL SPINE     c5-6 fusion 12/08 Dr. Consuello Masse  . s/p cervical disc surgery and fusion  10/09   Dr. Consuello Masse       Home Medications    Prior to Admission medications   Medication Sig Start Date End Date Taking? Authorizing Provider  amLODipine (NORVASC) 5 MG tablet Take 1 tablet (5 mg total) by mouth daily. 12/31/15 12/30/16 Yes Evie Lacks Plotnikov, MD  aspirin 81 MG tablet Take 81 mg by mouth daily.     Yes Historical Provider, MD  chlorpheniramine-HYDROcodone (TUSSIONEX PENNKINETIC ER) 10-8 MG/5ML SUER Take 5 mLs by mouth every 12 (twelve) hours as needed for cough. 12/31/15  Yes Cassandria Anger, MD  cholecalciferol (VITAMIN D) 1000  units tablet Take 3,000 Units by mouth daily.   Yes Historical Provider, MD  cyclobenzaprine (FLEXERIL) 5 MG tablet Take 1 tablet (5 mg total) by mouth at bedtime. 12/17/15  Yes Donika K Patel, DO  furosemide (LASIX) 40 MG tablet Take 1 tablet (40 mg total) by mouth daily. 12/31/15  Yes Evie Lacks Plotnikov, MD  gabapentin (NEURONTIN) 600 MG tablet Take 1 tablet (600 mg total) by mouth 2 (two) times daily. Patient taking differently:  Take 600 mg by mouth daily.  12/17/15  Yes Alda Berthold, DO  hydrocortisone 2.5 % cream  11/19/15  Yes Historical Provider, MD  Insulin Glargine (LANTUS) 100 UNIT/ML Solostar Pen Inject 150 Units into the skin every morning. 12/31/15  Yes Evie Lacks Plotnikov, MD  losartan (COZAAR) 100 MG tablet Take 1 tablet (100 mg total) by mouth daily. 12/31/15  Yes Evie Lacks Plotnikov, MD  metoprolol succinate (TOPROL XL) 25 MG 24 hr tablet Take 1 tablet (25 mg total) by mouth daily. 12/31/15 01/12/17 Yes Evie Lacks Plotnikov, MD  Naproxen-Esomeprazole 500-20 MG TBEC Take 1 tablet by mouth 2 (two) times daily as needed. 09/10/15  Yes Evie Lacks Plotnikov, MD  ondansetron (ZOFRAN) 4 MG tablet Take 1 tablet (4 mg total) by mouth every 6 (six) hours as needed for nausea. 08/26/14  Yes Theodis Blaze, MD  tamsulosin (FLOMAX) 0.4 MG CAPS capsule Take 1 capsule (0.4 mg total) by mouth daily after supper. Patient taking differently: Take 0.8 mg by mouth daily after supper.  12/31/15  Yes Evie Lacks Plotnikov, MD  tiZANidine (ZANAFLEX) 4 MG tablet Take 1 tablet (4 mg total) by mouth at bedtime as needed for muscle spasms (and neck pain). 09/04/15  Yes Donika K Patel, DO  acyclovir (ZOVIRAX) 800 MG tablet Take 1 tablet (800 mg total) by mouth 5 (five) times daily. Patient not taking: Reported on 02/12/2016 06/26/15   Evie Lacks Plotnikov, MD  B-D ULTRAFINE III SHORT PEN 31G X 8 MM MISC USE AS DIRECTED Patient not taking: Reported on 02/12/2016 02/09/16   Evie Lacks Plotnikov, MD  diazepam (VALIUM) 5 MG tablet Take 1 tablet (5 mg total) by mouth every 6 (six) hours as needed for muscle spasms. 02/12/16   Duffy Bruce, MD  Diclofenac Sodium (PENNSAID) 2 % SOLN Place 1 application onto the skin 2 (two) times daily as needed. Patient not taking: Reported on 02/12/2016 10/31/15   Cassandria Anger, MD  HYDROcodone-acetaminophen (NORCO/VICODIN) 5-325 MG tablet Take 1 tablet by mouth every 6 (six) hours as needed for severe pain. Be careful taking  this with your Valium, as it can cause severe drowsiness. No drinking alcohol. 02/12/16   Duffy Bruce, MD  lubiprostone (AMITIZA) 24 MCG capsule Take 1 capsule (24 mcg total) by mouth 2 (two) times daily with a meal. Patient not taking: Reported on 02/12/2016 06/26/15   Evie Lacks Plotnikov, MD  potassium chloride SA (K-DUR,KLOR-CON) 20 MEQ tablet Take 2 tablets (40 mEq total) by mouth daily. 02/12/16 02/17/16  Duffy Bruce, MD  rosuvastatin (CRESTOR) 20 MG tablet Take 1 tablet (20 mg total) by mouth daily. Patient not taking: Reported on 02/12/2016 09/10/15   Cassandria Anger, MD    Family History Family History  Problem Relation Age of Onset  . Heart disease Father     Died of MI at age 28  . Breast cancer Mother   . Diabetes Paternal Grandmother   . Healthy Son   . Colon cancer Neg Hx  Social History Social History  Substance Use Topics  . Smoking status: Former Smoker    Quit date: 01/20/1981  . Smokeless tobacco: Never Used  . Alcohol use 4.2 oz/week    4 Glasses of wine, 3 Standard drinks or equivalent per week     Comment: He drinks 3-4 glasses of wine several times per week.     Allergies   Amoxicillin; Lisinopril; Penicillins; and Simvastatin   Review of Systems Review of Systems  Constitutional: Negative for chills, fatigue and fever.  HENT: Negative for congestion and rhinorrhea.   Eyes: Negative for visual disturbance.  Respiratory: Negative for cough, shortness of breath and wheezing.   Cardiovascular: Negative for chest pain and leg swelling.  Gastrointestinal: Negative for abdominal pain, diarrhea, nausea and vomiting.  Genitourinary: Negative for dysuria and flank pain.  Musculoskeletal: Positive for arthralgias, back pain, gait problem and myalgias. Negative for neck pain and neck stiffness.  Skin: Negative for rash and wound.  Allergic/Immunologic: Negative for immunocompromised state.  Neurological: Negative for syncope, weakness and headaches.    All other systems reviewed and are negative.    Physical Exam Updated Vital Signs BP 120/56   Pulse 92   Temp 97.4 F (36.3 C) (Oral)   Resp 15   SpO2 96%   Physical Exam  Constitutional: He is oriented to person, place, and time. He appears well-developed and well-nourished. No distress.  Wincing in pain  HENT:  Head: Normocephalic and atraumatic.  Eyes: Conjunctivae are normal.  Neck: Neck supple.  Cardiovascular: Normal rate, regular rhythm and normal heart sounds.  Exam reveals no friction rub.   No murmur heard. Pulmonary/Chest: Effort normal and breath sounds normal. No respiratory distress. He has no wheezes. He has no rales.  Abdominal: Soft. Bowel sounds are normal. He exhibits no distension.  Musculoskeletal: He exhibits no edema.  Diffuse muscular TTP. Palpable muscle spasms in left calf, bilateral hands. No deformity or bruising.  Neurological: He is alert and oriented to person, place, and time. He exhibits normal muscle tone.  Skin: Skin is warm. Capillary refill takes less than 2 seconds.  Psychiatric: He has a normal mood and affect.  Nursing note and vitals reviewed.   Neurological Exam:  Mental Status: Alert and oriented to person, place, and time. Attention and concentration normal. Speech clear. Recent memory is intact. Cranial Nerves: Visual fields intact to confrontation in all quadrants bilaterally. EOMI and PERRLA. No nystagmus noted. Facial sensation intact at forehead, maxillary cheek, and chin/mandible bilaterally. No weakness of masticatory muscles. No facial asymmetry or weakness. Hearing grossly normal to finer rub. Uvula is midline, and palate elevates symmetrically. Normal SCM and trapezius strength. Tongue midline without fasciculations Motor: Muscle strength 5/5 in proximal and distal UE and LE bilaterally. No pronator drift. Muscle tone increased due to spasm in left calf, bilateral hands, intermittently in right calf. Reflexes: 2+ and  symmetrical in all four extremities.  Sensation: Intact to light touch in upper and lower extremities distally bilaterally.  Gait: Normal without ataxia. Coordination: Normal FTN bilaterally.    ED Treatments / Results  Labs (all labs ordered are listed, but only abnormal results are displayed) Labs Reviewed  CBC - Abnormal; Notable for the following:       Result Value   MCH 25.4 (*)    All other components within normal limits  BASIC METABOLIC PANEL - Abnormal; Notable for the following:    Potassium 3.2 (*)    Glucose, Bld 156 (*)  Creatinine, Ser 1.45 (*)    GFR calc non Af Amer 49 (*)    GFR calc Af Amer 57 (*)    Anion gap 17 (*)    All other components within normal limits  PHOSPHORUS - Abnormal; Notable for the following:    Phosphorus 1.8 (*)    All other components within normal limits  I-STAT CHEM 8, ED - Abnormal; Notable for the following:    Potassium 3.2 (*)    BUN 23 (*)    Creatinine, Ser 1.30 (*)    Glucose, Bld 158 (*)    All other components within normal limits  MAGNESIUM  CK    EKG  EKG Interpretation  Date/Time:  Thursday February 12 2016 08:03:09 EDT Ventricular Rate:  92 PR Interval:    QRS Duration: 108 QT Interval:  401 QTC Calculation: 497 R Axis:   53 Text Interpretation:  Sinus rhythm Borderline prolonged QT interval No significant change since last tracing Confirmed by Kahiau Schewe MD, Lysbeth Galas [phone]) on 02/12/2016 9:06:01 AM       Radiology No results found.  Procedures Procedures (including critical care time)  Medications Ordered in ED Medications  diazepam (VALIUM) injection 5 mg (5 mg Intravenous Given 02/12/16 0712)  methocarbamol (ROBAXIN) 1,000 mg in dextrose 5 % 50 mL IVPB (0 mg Intravenous Stopped 02/12/16 0802)  sodium chloride 0.9 % bolus 500 mL (0 mLs Intravenous Stopped 02/12/16 0815)  potassium chloride 10 mEq in 100 mL IVPB (0 mEq Intravenous Stopped 02/12/16 0930)  lactated ringers bolus 500 mL (0 mLs Intravenous  Stopped 02/12/16 0945)  potassium phosphate (monobasic) (K-PHOS ORIGINAL) tablet 1,000 mg (1,000 mg Oral Given 02/12/16 0920)  potassium chloride SA (K-DUR,KLOR-CON) CR tablet 40 mEq (40 mEq Oral Given 02/12/16 0815)  diazepam (VALIUM) tablet 5 mg (5 mg Oral Given 02/12/16 0830)  HYDROcodone-acetaminophen (NORCO/VICODIN) 5-325 MG per tablet 1 tablet (1 tablet Oral Given 02/12/16 0830)     Initial Impression / Assessment and Plan / ED Course  I have reviewed the triage vital signs and the nursing notes.  Pertinent labs & imaging results that were available during my care of the patient were reviewed by me and considered in my medical decision making (see chart for details).  Clinical Course    65 yo M with PMHx of HTN, DM2, HLD, OSA who p/w bilateral UE and LE cramping and pain. See HPI above. On arrival, VSS and WNL. Exam is as above. Pt has palpable spasm of multiple muscles but neurological exam is o/w nonfocal. Do not suspect CVA. Suspect pt has muscular cramping/spasm in setting of recent PT session, with consideration of dehydration, electrolyte abnormalities (hypo/hyperK, mag, phos abnormalities), less likely rhabdo. Will check labs, give valium/robaxin and gentle fluids, and re-assess. Pt in agreement. No rash, fever, or signs to suggest infectious or inflammatory process.  Labs reviewed and show mild AKI, hypoK, hypoPhos. Symptoms improved after valium/robaxin. Suspect muscle cramping due to increased use in PT with exacerbation from hypokalemia and hyphos, likely 2/2 lasix use and dehydration from lasix with increased PO itnake. Will send home with stretching exercises, encourage hydration, and provide potassium supplementation. He will f/u with PCP in 1 week for repeat labs and check-up. Pt is in agreement with this plan. Ambulatory in ED without difficulty. Will d/c home.  Final Clinical Impressions(s) / ED Diagnoses   Final diagnoses:  Hypokalemia  Muscle spasm  Dehydration    Hypophosphatemia    New Prescriptions Discharge Medication List as of 02/12/2016  9:32 AM    START taking these medications   Details  diazepam (VALIUM) 5 MG tablet Take 1 tablet (5 mg total) by mouth every 6 (six) hours as needed for muscle spasms., Starting Thu 02/12/2016, Print    HYDROcodone-acetaminophen (NORCO/VICODIN) 5-325 MG tablet Take 1 tablet by mouth every 6 (six) hours as needed for severe pain. Be careful taking this with your Valium, as it can cause severe drowsiness. No drinking alcohol., Starting Thu 02/12/2016, Print    potassium chloride SA (K-DUR,KLOR-CON) 20 MEQ tablet Take 2 tablets (40 mEq total) by mouth daily., Starting Thu 02/12/2016, Until Tue 02/17/2016, Print         Duffy Bruce, MD 02/12/16 1039

## 2016-02-12 NOTE — ED Notes (Signed)
When pt assisted to sitting in stretcher, c/o cramps returning in legs. Repositioned to laying flat and pain slightly subsided.

## 2016-02-12 NOTE — ED Notes (Signed)
Pt ambulated in the [facility] only complaint was that his calf was hurting

## 2016-02-12 NOTE — Discharge Instructions (Signed)
Drink plenty of fluids (6-8 glasses of water per day) until follow-up  Take your potassium along with lasix for the next week  Follow-up with your doctor in 1 week for repeat labs and check-up  Return to the ED for any new or worsening symptoms

## 2016-02-17 ENCOUNTER — Other Ambulatory Visit (INDEPENDENT_AMBULATORY_CARE_PROVIDER_SITE_OTHER): Payer: Managed Care, Other (non HMO)

## 2016-02-17 DIAGNOSIS — Z0000 Encounter for general adult medical examination without abnormal findings: Secondary | ICD-10-CM

## 2016-02-17 DIAGNOSIS — E1165 Type 2 diabetes mellitus with hyperglycemia: Secondary | ICD-10-CM | POA: Diagnosis not present

## 2016-02-17 DIAGNOSIS — Z794 Long term (current) use of insulin: Secondary | ICD-10-CM

## 2016-02-17 LAB — CBC WITH DIFFERENTIAL/PLATELET
Basophils Absolute: 0 10*3/uL (ref 0.0–0.1)
Basophils Relative: 0.3 % (ref 0.0–3.0)
EOS PCT: 4.4 % (ref 0.0–5.0)
Eosinophils Absolute: 0.4 10*3/uL (ref 0.0–0.7)
HCT: 43.4 % (ref 39.0–52.0)
Hemoglobin: 14.6 g/dL (ref 13.0–17.0)
LYMPHS ABS: 1.7 10*3/uL (ref 0.7–4.0)
Lymphocytes Relative: 19.6 % (ref 12.0–46.0)
MCHC: 33.5 g/dL (ref 30.0–36.0)
MCV: 76.3 fl — AB (ref 78.0–100.0)
MONOS PCT: 5.6 % (ref 3.0–12.0)
Monocytes Absolute: 0.5 10*3/uL (ref 0.1–1.0)
NEUTROS ABS: 6.2 10*3/uL (ref 1.4–7.7)
NEUTROS PCT: 70.1 % (ref 43.0–77.0)
PLATELETS: 338 10*3/uL (ref 150.0–400.0)
RBC: 5.69 Mil/uL (ref 4.22–5.81)
RDW: 15.9 % — ABNORMAL HIGH (ref 11.5–15.5)
WBC: 8.8 10*3/uL (ref 4.0–10.5)

## 2016-02-17 LAB — URINALYSIS, ROUTINE W REFLEX MICROSCOPIC
Bilirubin Urine: NEGATIVE
Ketones, ur: NEGATIVE
Leukocytes, UA: NEGATIVE
Nitrite: NEGATIVE
PH: 5.5 (ref 5.0–8.0)
SPECIFIC GRAVITY, URINE: 1.025 (ref 1.000–1.030)
Total Protein, Urine: >=300 — AB
UROBILINOGEN UA: 0.2 (ref 0.0–1.0)
Urine Glucose: >=1000 — AB

## 2016-02-17 LAB — BASIC METABOLIC PANEL
BUN: 29 mg/dL — ABNORMAL HIGH (ref 6–23)
CALCIUM: 9.1 mg/dL (ref 8.4–10.5)
CO2: 27 meq/L (ref 19–32)
CREATININE: 1.31 mg/dL (ref 0.40–1.50)
Chloride: 96 mEq/L (ref 96–112)
GFR: 58.28 mL/min — ABNORMAL LOW (ref >60.00)
Glucose, Bld: 416 mg/dL — ABNORMAL HIGH (ref 70–99)
Potassium: 5.6 mEq/L — ABNORMAL HIGH (ref 3.5–5.1)
Sodium: 131 mEq/L — ABNORMAL LOW (ref 135–145)

## 2016-02-17 LAB — LIPID PANEL
CHOL/HDL RATIO: 8
Cholesterol: 285 mg/dL — ABNORMAL HIGH (ref 0–200)
HDL: 35.8 mg/dL — ABNORMAL LOW (ref >39.00)
NonHDL: 249.27
Triglycerides: 309 mg/dL — ABNORMAL HIGH (ref 0.0–149.0)
VLDL: 61.8 mg/dL — ABNORMAL HIGH (ref 0.0–40.0)

## 2016-02-17 LAB — HEPATIC FUNCTION PANEL
ALBUMIN: 3.4 g/dL — AB (ref 3.5–5.2)
ALT: 23 U/L (ref 0–53)
AST: 15 U/L (ref 0–37)
Alkaline Phosphatase: 102 U/L (ref 39–117)
BILIRUBIN TOTAL: 0.4 mg/dL (ref 0.2–1.2)
Bilirubin, Direct: 0.1 mg/dL (ref 0.0–0.3)
Total Protein: 6.3 g/dL (ref 6.0–8.3)

## 2016-02-17 LAB — LDL CHOLESTEROL, DIRECT: Direct LDL: 200 mg/dL

## 2016-02-17 LAB — TSH: TSH: 2.76 u[IU]/mL (ref 0.35–4.50)

## 2016-02-17 LAB — MICROALBUMIN / CREATININE URINE RATIO
CREATININE, U: 90.6 mg/dL
MICROALB UR: 195.1 mg/dL — AB (ref 0.0–1.9)
MICROALB/CREAT RATIO: 215.2 mg/g — AB (ref 0.0–30.0)

## 2016-02-17 LAB — HEMOGLOBIN A1C: HEMOGLOBIN A1C: 9.3 % — AB (ref 4.6–6.5)

## 2016-02-17 LAB — PSA: PSA: 0.8 ng/mL (ref 0.10–4.00)

## 2016-02-27 ENCOUNTER — Ambulatory Visit (INDEPENDENT_AMBULATORY_CARE_PROVIDER_SITE_OTHER): Payer: Managed Care, Other (non HMO) | Admitting: Internal Medicine

## 2016-02-27 ENCOUNTER — Encounter: Payer: Self-pay | Admitting: Internal Medicine

## 2016-02-27 DIAGNOSIS — R600 Localized edema: Secondary | ICD-10-CM | POA: Diagnosis not present

## 2016-02-27 DIAGNOSIS — R609 Edema, unspecified: Secondary | ICD-10-CM | POA: Insufficient documentation

## 2016-02-27 DIAGNOSIS — I2510 Atherosclerotic heart disease of native coronary artery without angina pectoris: Secondary | ICD-10-CM | POA: Diagnosis not present

## 2016-02-27 DIAGNOSIS — E1165 Type 2 diabetes mellitus with hyperglycemia: Secondary | ICD-10-CM | POA: Diagnosis not present

## 2016-02-27 DIAGNOSIS — Z794 Long term (current) use of insulin: Secondary | ICD-10-CM

## 2016-02-27 MED ORDER — EMPAGLIFLOZIN 10 MG PO TABS: 10.0000 mg | ORAL_TABLET | Freq: Every day | ORAL | 11 refills | Status: DC

## 2016-02-27 MED ORDER — POTASSIUM CHLORIDE ER 8 MEQ PO TBCR: 16.0000 meq | EXTENDED_RELEASE_TABLET | Freq: Every day | ORAL | 5 refills | Status: DC | PRN

## 2016-02-27 NOTE — Assessment & Plan Note (Signed)
Toprol XL, ASA, Amlodipine Crestor - re-start

## 2016-02-27 NOTE — Progress Notes (Signed)
Subjective:  Patient ID: [name] [name], male    DOB: [birth year]  Age: 65 y.o. MRN: [id number]  CC: No chief complaint on file.   HPI Awesome Lyng presents for DM, HTN, dyslipidemia f/u. He was seen in the ER for muscle cramps and low K on 10/5 - notes/labs reviewed... Retired last wk  Outpatient Medications Prior to Visit  Medication Sig Dispense Refill  . amLODipine (NORVASC) 5 MG tablet Take 1 tablet (5 mg total) by mouth daily. 30 tablet 11  . aspirin 81 MG tablet Take 81 mg by mouth daily.      . B-D ULTRAFINE III SHORT PEN 31G X 8 MM MISC USE AS DIRECTED 180 each 0  . chlorpheniramine-HYDROcodone (TUSSIONEX PENNKINETIC ER) 10-8 MG/5ML SUER Take 5 mLs by mouth every 12 (twelve) hours as needed for cough. 140 mL 0  . cholecalciferol (VITAMIN D) 1000 units tablet Take 3,000 Units by mouth daily.    . cyclobenzaprine (FLEXERIL) 5 MG tablet Take 1 tablet (5 mg total) by mouth at bedtime. 30 tablet 5  . diazepam (VALIUM) 5 MG tablet Take 1 tablet (5 mg total) by mouth every 6 (six) hours as needed for muscle spasms. 12 tablet 0  . Diclofenac Sodium (PENNSAID) 2 % SOLN Place 1 application onto the skin 2 (two) times daily as needed. 112 g 2  . furosemide (LASIX) 40 MG tablet Take 1 tablet (40 mg total) by mouth daily. 30 tablet 3  . gabapentin (NEURONTIN) 600 MG tablet Take 1 tablet (600 mg total) by mouth 2 (two) times daily. (Patient taking differently: Take 600 mg by mouth daily. ) 60 tablet 5  . HYDROcodone-acetaminophen (NORCO/VICODIN) 5-325 MG tablet Take 1 tablet by mouth every 6 (six) hours as needed for severe pain. Be careful taking this with your Valium, as it can cause severe drowsiness. No drinking alcohol. 6 tablet 0  . hydrocortisone 2.5 % cream     . Insulin Glargine (LANTUS) 100 UNIT/ML Solostar Pen Inject 150 Units into the skin every morning. 12 pen 11  . losartan (COZAAR) 100 MG tablet Take 1 tablet (100 mg total) by mouth daily. 30 tablet 11  . lubiprostone  (AMITIZA) 24 MCG capsule Take 1 capsule (24 mcg total) by mouth 2 (two) times daily with a meal. 60 capsule 11  . Naproxen-Esomeprazole 500-20 MG TBEC Take 1 tablet by mouth 2 (two) times daily as needed. 180 tablet 2  . ondansetron (ZOFRAN) 4 MG tablet Take 1 tablet (4 mg total) by mouth every 6 (six) hours as needed for nausea. 65 tablet 0  . rosuvastatin (CRESTOR) 20 MG tablet Take 1 tablet (20 mg total) by mouth daily. 90 tablet 3  . tamsulosin (FLOMAX) 0.4 MG CAPS capsule Take 1 capsule (0.4 mg total) by mouth daily after supper. (Patient taking differently: Take 0.8 mg by mouth daily after supper. ) 30 capsule 11  . tiZANidine (ZANAFLEX) 4 MG tablet Take 1 tablet (4 mg total) by mouth at bedtime as needed for muscle spasms (and neck pain). 30 tablet 3  . acyclovir (ZOVIRAX) 800 MG tablet Take 1 tablet (800 mg total) by mouth 5 (five) times daily. (Patient not taking: Reported on 02/27/2016) 50 tablet 0  . metoprolol succinate (TOPROL XL) 25 MG 24 hr tablet Take 1 tablet (25 mg total) by mouth daily. (Patient not taking: Reported on 02/27/2016) 30 tablet 11  . potassium chloride SA (K-DUR,KLOR-CON) 20 MEQ tablet Take 2 tablets (40 mEq total) by mouth daily. 10  tablet 0   No facility-administered medications prior to visit.     ROS Review of Systems  Constitutional: Negative for appetite change, fatigue and unexpected weight change.  HENT: Negative for congestion, nosebleeds, sneezing, sore throat and trouble swallowing.   Eyes: Negative for itching and visual disturbance.  Respiratory: Negative for cough.   Cardiovascular: Negative for chest pain, palpitations and leg swelling.  Gastrointestinal: Negative for abdominal distention, blood in stool, diarrhea and nausea.  Genitourinary: Negative for frequency and hematuria.  Musculoskeletal: Negative for back pain, gait problem, joint swelling and neck pain.  Skin: Negative for rash.  Neurological: Negative for dizziness, tremors, speech  difficulty and weakness.  Psychiatric/Behavioral: Negative for agitation, dysphoric mood and sleep disturbance. The patient is not nervous/anxious.     Objective:  BP 130/80   Pulse 70   Wt (!) 301 lb (136.5 kg)   SpO2 97%   BMI 43.19 kg/m   BP Readings from Last 3 Encounters:  02/27/16 130/80  02/12/16 120/56  12/31/15 (!) 158/72    Wt Readings from Last 3 Encounters:  02/27/16 (!) 301 lb (136.5 kg)  12/31/15 (!) 308 lb (139.7 kg)  12/17/15 (!) 308 lb 2 oz (139.8 kg)    Physical Exam  Constitutional: He is oriented to person, place, and time. He appears well-developed. No distress.  NAD  HENT:  Mouth/Throat: Oropharynx is clear and moist.  Eyes: Conjunctivae are normal. Pupils are equal, round, and reactive to light.  Neck: Normal range of motion. No JVD present. No thyromegaly present.  Cardiovascular: Normal rate, regular rhythm, normal heart sounds and intact distal pulses.  Exam reveals no gallop and no friction rub.   No murmur heard. Pulmonary/Chest: Effort normal and breath sounds normal. No respiratory distress. He has no wheezes. He has no rales. He exhibits no tenderness.  Abdominal: Soft. Bowel sounds are normal. He exhibits no distension and no mass. There is no tenderness. There is no rebound and no guarding.  Musculoskeletal: Normal range of motion. He exhibits no edema or tenderness.  Lymphadenopathy:    He has no cervical adenopathy.  Neurological: He is alert and oriented to person, place, and time. He has normal reflexes. No cranial nerve deficit. He exhibits normal muscle tone. He displays a negative Romberg sign. Coordination and gait normal.  Skin: Skin is warm and dry. No rash noted.  Psychiatric: He has a normal mood and affect. His behavior is normal. Judgment and thought content normal.  Obese 1+ edema  Lab Results  Component Value Date   WBC 8.8 02/17/2016   HGB 14.6 02/17/2016   HCT 43.4 02/17/2016   PLT 338.0 02/17/2016   GLUCOSE 416 (H)  02/17/2016   CHOL 285 (H) 02/17/2016   TRIG 309.0 (H) 02/17/2016   HDL 35.80 (L) 02/17/2016   LDLDIRECT 200.0 02/17/2016   LDLCALC 139 (H) 04/09/2015   ALT 23 02/17/2016   AST 15 02/17/2016   NA 131 (L) 02/17/2016   K 5.6 (H) 02/17/2016   CL 96 02/17/2016   CREATININE 1.31 02/17/2016   BUN 29 (H) 02/17/2016   CO2 27 02/17/2016   TSH 2.76 02/17/2016   PSA 0.80 02/17/2016   INR 1.08 02/02/2011   HGBA1C 9.3 (H) 02/17/2016   MICROALBUR 195.1 (H) 02/17/2016    No results found.  Assessment & Plan:   There are no diagnoses linked to this encounter. I am having Mr. Highlander maintain his aspirin, ondansetron, acyclovir, lubiprostone, tiZANidine, rosuvastatin, Naproxen-Esomeprazole, Diclofenac Sodium, hydrocortisone, cyclobenzaprine, gabapentin, furosemide,  chlorpheniramine-HYDROcodone, Insulin Glargine, losartan, metoprolol succinate, tamsulosin, amLODipine, B-D ULTRAFINE III SHORT PEN, potassium chloride SA, cholecalciferol, diazepam, and HYDROcodone-acetaminophen.  No orders of the defined types were placed in this encounter.    Follow-up: No Follow-up on file.  Walker Kehr, MD

## 2016-02-27 NOTE — Progress Notes (Signed)
Pre visit review using our clinic review tool, if applicable. No additional management support is needed unless otherwise documented below in the visit note. 

## 2016-02-27 NOTE — Assessment & Plan Note (Signed)
Worse Will add Jardiance  Potential benefits of a long term Jardiance use as well as potential risks  and complications were explained to the patient and were aknowledged.

## 2016-02-27 NOTE — Assessment & Plan Note (Signed)
Wt related Lasix and KCl prn Compr socks; wt loss May need to d/c Norvasc

## 2016-02-27 NOTE — Assessment & Plan Note (Signed)
Potential wt loss benefits of a long term Jardiance use as well as potential risks  and complications were explained to the patient and were aknowledged.

## 2016-04-05 ENCOUNTER — Encounter: Payer: Self-pay | Admitting: Internal Medicine

## 2016-04-11 ENCOUNTER — Other Ambulatory Visit: Payer: Self-pay | Admitting: Internal Medicine

## 2016-04-11 DIAGNOSIS — Z8603 Personal history of neoplasm of uncertain behavior: Secondary | ICD-10-CM

## 2016-04-11 DIAGNOSIS — M542 Cervicalgia: Secondary | ICD-10-CM

## 2016-05-14 ENCOUNTER — Encounter: Payer: Self-pay | Admitting: Internal Medicine

## 2016-05-14 ENCOUNTER — Ambulatory Visit (INDEPENDENT_AMBULATORY_CARE_PROVIDER_SITE_OTHER): Payer: Managed Care, Other (non HMO) | Admitting: Internal Medicine

## 2016-05-14 DIAGNOSIS — I2510 Atherosclerotic heart disease of native coronary artery without angina pectoris: Secondary | ICD-10-CM

## 2016-05-14 DIAGNOSIS — E1165 Type 2 diabetes mellitus with hyperglycemia: Secondary | ICD-10-CM

## 2016-05-14 DIAGNOSIS — M79602 Pain in left arm: Secondary | ICD-10-CM

## 2016-05-14 DIAGNOSIS — I10 Essential (primary) hypertension: Secondary | ICD-10-CM | POA: Diagnosis not present

## 2016-05-14 DIAGNOSIS — Z794 Long term (current) use of insulin: Secondary | ICD-10-CM

## 2016-05-14 MED ORDER — HYDROCOD POLST-CPM POLST ER 10-8 MG/5ML PO SUER: 5.0000 mL | Freq: Two times a day (BID) | ORAL | 0 refills | Status: DC | PRN

## 2016-05-14 MED ORDER — FUROSEMIDE 40 MG PO TABS: 40.0000 mg | ORAL_TABLET | Freq: Every day | ORAL | 3 refills | Status: DC

## 2016-05-14 MED ORDER — LOSARTAN POTASSIUM 100 MG PO TABS: 100.0000 mg | ORAL_TABLET | Freq: Every day | ORAL | 3 refills | Status: DC

## 2016-05-14 MED ORDER — METOPROLOL SUCCINATE ER 25 MG PO TB24: 25.0000 mg | ORAL_TABLET | Freq: Every day | ORAL | 3 refills | Status: DC

## 2016-05-14 MED ORDER — PRAVASTATIN SODIUM 40 MG PO TABS: 40.0000 mg | ORAL_TABLET | Freq: Every day | ORAL | 3 refills | Status: DC

## 2016-05-14 MED ORDER — INSULIN GLARGINE 300 UNIT/ML ~~LOC~~ SOPN: 150.0000 [IU] | PEN_INJECTOR | Freq: Every morning | SUBCUTANEOUS | 11 refills | Status: DC

## 2016-05-14 MED ORDER — BD PEN NEEDLE SHORT U/F 31G X 8 MM MISC: 3 refills | Status: DC

## 2016-05-14 MED ORDER — POTASSIUM CHLORIDE ER 8 MEQ PO TBCR: 16.0000 meq | EXTENDED_RELEASE_TABLET | Freq: Every day | ORAL | 3 refills | Status: DC | PRN

## 2016-05-14 MED ORDER — TAMSULOSIN HCL 0.4 MG PO CAPS: 0.4000 mg | ORAL_CAPSULE | Freq: Every day | ORAL | 3 refills | Status: DC

## 2016-05-14 MED ORDER — AMLODIPINE BESYLATE 5 MG PO TABS: 5.0000 mg | ORAL_TABLET | Freq: Every day | ORAL | 3 refills | Status: DC

## 2016-05-14 NOTE — Assessment & Plan Note (Signed)
Wt Readings from Last 3 Encounters:  05/14/16 300 lb (136.1 kg)  02/27/16 (!) 301 lb (136.5 kg)  12/31/15 (!) 308 lb (139.7 kg)

## 2016-05-14 NOTE — Assessment & Plan Note (Signed)
D/c Lantus; start [place] Re-start Jardiance

## 2016-05-14 NOTE — Progress Notes (Signed)
Subjective:  Patient ID: [name] [name], male    DOB: 1950/08/03  Age: 66 y.o. MRN: [id number]  CC: No chief complaint on file.   HPI Lucion Wege presents for DM, HTN, OA He started [place] He is c/o GERD - worse on Jardiance (?) C/o occ cough  Outpatient Medications Prior to Visit  Medication Sig Dispense Refill  . acyclovir (ZOVIRAX) 800 MG tablet Take 1 tablet (800 mg total) by mouth 5 (five) times daily. 50 tablet 0  . amLODipine (NORVASC) 5 MG tablet Take 1 tablet (5 mg total) by mouth daily. 30 tablet 11  . aspirin 81 MG tablet Take 81 mg by mouth daily.      . B-D ULTRAFINE III SHORT PEN 31G X 8 MM MISC USE AS DIRECTED 180 each 0  . chlorpheniramine-HYDROcodone (TUSSIONEX PENNKINETIC ER) 10-8 MG/5ML SUER Take 5 mLs by mouth every 12 (twelve) hours as needed for cough. 140 mL 0  . cholecalciferol (VITAMIN D) 1000 units tablet Take 3,000 Units by mouth daily.    . cyclobenzaprine (FLEXERIL) 5 MG tablet Take 1 tablet (5 mg total) by mouth at bedtime. 30 tablet 5  . diazepam (VALIUM) 5 MG tablet Take 1 tablet (5 mg total) by mouth every 6 (six) hours as needed for muscle spasms. 12 tablet 0  . empagliflozin (JARDIANCE) 10 MG TABS tablet Take 10 mg by mouth daily. 30 tablet 11  . furosemide (LASIX) 40 MG tablet Take 1 tablet (40 mg total) by mouth daily. 30 tablet 3  . gabapentin (NEURONTIN) 600 MG tablet Take 1 tablet (600 mg total) by mouth 2 (two) times daily. (Patient taking differently: Take 600 mg by mouth daily. ) 60 tablet 5  . hydrocortisone 2.5 % cream     . Insulin Glargine (LANTUS) 100 UNIT/ML Solostar Pen Inject 150 Units into the skin every morning. 12 pen 11  . losartan (COZAAR) 100 MG tablet Take 1 tablet (100 mg total) by mouth daily. 30 tablet 11  . lubiprostone (AMITIZA) 24 MCG capsule Take 1 capsule (24 mcg total) by mouth 2 (two) times daily with a meal. 60 capsule 11  . metoprolol succinate (TOPROL XL) 25 MG 24 hr tablet Take 1 tablet (25 mg total) by  mouth daily. 30 tablet 11  . Naproxen-Esomeprazole 500-20 MG TBEC Take 1 tablet by mouth 2 (two) times daily as needed. 180 tablet 2  . ondansetron (ZOFRAN) 4 MG tablet Take 1 tablet (4 mg total) by mouth every 6 (six) hours as needed for nausea. 65 tablet 0  . potassium chloride (KLOR-CON) 8 MEQ tablet Take 2 tablets (16 mEq total) by mouth daily as needed. Use with Lasix 60 tablet 5  . rosuvastatin (CRESTOR) 20 MG tablet Take 1 tablet (20 mg total) by mouth daily. 90 tablet 3  . tamsulosin (FLOMAX) 0.4 MG CAPS capsule Take 1 capsule (0.4 mg total) by mouth daily after supper. (Patient taking differently: Take 0.8 mg by mouth daily after supper. ) 30 capsule 11  . tiZANidine (ZANAFLEX) 4 MG tablet Take 1 tablet (4 mg total) by mouth at bedtime as needed for muscle spasms (and neck pain). 30 tablet 3  . Diclofenac Sodium (PENNSAID) 2 % SOLN Place 1 application onto the skin 2 (two) times daily as needed. 112 g 2  . HYDROcodone-acetaminophen (NORCO/VICODIN) 5-325 MG tablet Take 1 tablet by mouth every 6 (six) hours as needed for severe pain. Be careful taking this with your Valium, as it can cause severe drowsiness. No  drinking alcohol. 6 tablet 0   No facility-administered medications prior to visit.     ROS Review of Systems  Constitutional: Negative for appetite change, fatigue and unexpected weight change.  HENT: Negative for congestion, nosebleeds, sneezing, sore throat and trouble swallowing.   Eyes: Negative for itching and visual disturbance.  Respiratory: Negative for cough.   Cardiovascular: Negative for chest pain, palpitations and leg swelling.  Gastrointestinal: Negative for abdominal distention, blood in stool, diarrhea and nausea.  Genitourinary: Negative for frequency and hematuria.  Musculoskeletal: Negative for back pain, gait problem, joint swelling and neck pain.  Skin: Negative for rash.  Neurological: Negative for dizziness, tremors, speech difficulty and weakness.    Psychiatric/Behavioral: Negative for agitation, dysphoric mood and sleep disturbance. The patient is not nervous/anxious.     Objective:  BP (!) 160/88   Pulse 92   Temp 98.4 F (36.9 C) (Oral)   Resp 20   Wt 300 lb (136.1 kg)   SpO2 95%   BMI 43.05 kg/m   BP Readings from Last 3 Encounters:  05/14/16 (!) 160/88  02/27/16 130/80  02/12/16 120/56    Wt Readings from Last 3 Encounters:  05/14/16 300 lb (136.1 kg)  02/27/16 (!) 301 lb (136.5 kg)  12/31/15 (!) 308 lb (139.7 kg)    Physical Exam  Constitutional: He is oriented to person, place, and time. He appears well-developed. No distress.  NAD  HENT:  Mouth/Throat: Oropharynx is clear and moist.  Eyes: Conjunctivae are normal. Pupils are equal, round, and reactive to light.  Neck: Normal range of motion. No JVD present. No thyromegaly present.  Cardiovascular: Normal rate, regular rhythm, normal heart sounds and intact distal pulses.  Exam reveals no gallop and no friction rub.   No murmur heard. Pulmonary/Chest: Effort normal and breath sounds normal. No respiratory distress. He has no wheezes. He has no rales. He exhibits no tenderness.  Abdominal: Soft. Bowel sounds are normal. He exhibits no distension and no mass. There is no tenderness. There is no rebound and no guarding.  Musculoskeletal: Normal range of motion. He exhibits no edema or tenderness.  Lymphadenopathy:    He has no cervical adenopathy.  Neurological: He is alert and oriented to person, place, and time. He has normal reflexes. No cranial nerve deficit. He exhibits normal muscle tone. He displays a negative Romberg sign. Coordination and gait normal.  Skin: Skin is warm and dry. No rash noted.  Psychiatric: He has a normal mood and affect. His behavior is normal. Judgment and thought content normal.  Obese  Lab Results  Component Value Date   WBC 8.8 02/17/2016   HGB 14.6 02/17/2016   HCT 43.4 02/17/2016   PLT 338.0 02/17/2016   GLUCOSE 416 (H)  02/17/2016   CHOL 285 (H) 02/17/2016   TRIG 309.0 (H) 02/17/2016   HDL 35.80 (L) 02/17/2016   LDLDIRECT 200.0 02/17/2016   LDLCALC 139 (H) 04/09/2015   ALT 23 02/17/2016   AST 15 02/17/2016   NA 131 (L) 02/17/2016   K 5.6 (H) 02/17/2016   CL 96 02/17/2016   CREATININE 1.31 02/17/2016   BUN 29 (H) 02/17/2016   CO2 27 02/17/2016   TSH 2.76 02/17/2016   PSA 0.80 02/17/2016   INR 1.08 02/02/2011   HGBA1C 9.3 (H) 02/17/2016   MICROALBUR 195.1 (H) 02/17/2016    No results found.  Assessment & Plan:   There are no diagnoses linked to this encounter. I have discontinued Mr. Anfinson's Diclofenac Sodium and HYDROcodone-acetaminophen.  I am also having him maintain his aspirin, ondansetron, acyclovir, lubiprostone, tiZANidine, rosuvastatin, Naproxen-Esomeprazole, hydrocortisone, cyclobenzaprine, gabapentin, furosemide, chlorpheniramine-HYDROcodone, Insulin Glargine, losartan, metoprolol succinate, tamsulosin, amLODipine, B-D ULTRAFINE III SHORT PEN, cholecalciferol, diazepam, empagliflozin, and potassium chloride.  No orders of the defined types were placed in this encounter.    Follow-up: No Follow-up on file.  Walker Kehr, MD

## 2016-05-14 NOTE — Assessment & Plan Note (Signed)
Vimovo 

## 2016-05-14 NOTE — Assessment & Plan Note (Signed)
Toprol XL, ASA, Amlodipine, Crestor May need to treat GERD

## 2016-05-14 NOTE — Assessment & Plan Note (Signed)
Toprol XL, ASA, Amlodipine, Losartan Labs

## 2016-05-14 NOTE — Progress Notes (Signed)
Pre visit review using our clinic review tool, if applicable. No additional management support is needed unless otherwise documented below in the visit note. 

## 2016-06-14 ENCOUNTER — Telehealth: Payer: Self-pay | Admitting: Neurology

## 2016-06-14 NOTE — Telephone Encounter (Signed)
PT called and said

## 2016-06-21 ENCOUNTER — Encounter: Payer: Self-pay | Admitting: Neurology

## 2016-06-21 ENCOUNTER — Telehealth: Payer: Self-pay | Admitting: Neurology

## 2016-06-21 ENCOUNTER — Ambulatory Visit (INDEPENDENT_AMBULATORY_CARE_PROVIDER_SITE_OTHER): Payer: Managed Care, Other (non HMO) | Admitting: Neurology

## 2016-06-21 VITALS — BP 140/84 | HR 70 | Ht 70.0 in | Wt 281.3 lb

## 2016-06-21 DIAGNOSIS — M4802 Spinal stenosis, cervical region: Secondary | ICD-10-CM | POA: Diagnosis not present

## 2016-06-21 DIAGNOSIS — M5412 Radiculopathy, cervical region: Secondary | ICD-10-CM | POA: Diagnosis not present

## 2016-06-21 DIAGNOSIS — R202 Paresthesia of skin: Secondary | ICD-10-CM | POA: Diagnosis not present

## 2016-06-21 MED ORDER — GABAPENTIN 600 MG PO TABS: 600.0000 mg | ORAL_TABLET | Freq: Two times a day (BID) | ORAL | 3 refills | Status: DC

## 2016-06-21 MED ORDER — CYCLOBENZAPRINE HCL 5 MG PO TABS: 5.0000 mg | ORAL_TABLET | Freq: Every day | ORAL | 3 refills | Status: DC

## 2016-06-21 NOTE — Patient Instructions (Addendum)
1.  NCS/EMG of the upper extremities 2.  Increase gabapentin to 600mg  twice daily 3.  Continue flexeril 5mg  at bedtime  Return to clinic in 6 months

## 2016-06-21 NOTE — Telephone Encounter (Signed)
PT thinks he needs a referral for the EMG so his insurance company will pay for it/Dawn [phone]

## 2016-06-21 NOTE — Progress Notes (Signed)
Follow-up Visit   Date: 06/21/16    [name] [name] MRN: [id number] DOB: Feb 08, 1951   Interim History: [name] [name] is a 66 y.o. right-handed Caucasian male with hypertension, hyperlipidemia, insulin-dependent diabetes mellitus type 2, OSA, s/p cervical decompression and fusion at C5-6 returning to the clinic for follow-up of left arm pain due to cervical canal stenosis.  The patient was accompanied to the clinic by self.  History of present illness: Starting around the end of January 2017, he began noticing right hip pain and was given prednisone which helped.  He travelled to [place] and when he awoke in the morning at the hotel, he developed severe and sharp left upper arm and forearm pain. He has no sensation of his index and left middle finger on the left.  Pain is much worse when he is supine and has found that raising his arm provides some relief.  He has noticed increased weakness of his left hand and often drops objects.  He has tried prednisone, gabapentin, and NSAIDs which helps some, but does not completely alleviate the pain.   He endorses suffering a mechanical fall in December 2016, there was no loss of consciousness or neck pain, but is concern that this may have contributed to his new arm pain.   He has history of cervical decompression and fusion x 2 previously for bilateral upper extremity pain and paresthesias.     UPDATE 12/17/2015:  He is taking gabapentin 300mg  in the morning and 600mg  at bedtime which helps some, but has has noticed greater benefit with Vimovo, which was ordered by his PCP.   He is sleeping better since taking both these medications.  Overall, pain and tingling is better than before, but he continues to have discomfort at night which prevents him from sleeping and often has to raise his arm above his head.  MRI cervical spine shows moderate canal stenosis at C2-3 and C4-5.  UPDATE 06/21/2016:  He completed PT and feels that his left shoulder ROM  improved.  He did not appreciate any changes in his left arm paresthesias which continues to involve his left lateral arm and forearm.  He has constant tingling of the fingers bilaterally.  Upon further questioning, he recalls having tingling and numbness of the fingers prior to his cervical decompression and that symptoms have not improved.  In fact, he feels that they are more intense now.  Symptoms are worse when he is laying supine to sleep.    Medications:  Current Outpatient Prescriptions on File Prior to Visit  Medication Sig Dispense Refill  . amLODipine (NORVASC) 5 MG tablet Take 1 tablet (5 mg total) by mouth daily. 90 tablet 3  . aspirin 81 MG tablet Take 81 mg by mouth daily.      . B-D ULTRAFINE III SHORT PEN 31G X 8 MM MISC USE AS DIRECTED 200 each 3  . cholecalciferol (VITAMIN D) 1000 units tablet Take 3,000 Units by mouth daily.    . empagliflozin (JARDIANCE) 10 MG TABS tablet Take 10 mg by mouth daily. 30 tablet 11  . furosemide (LASIX) 40 MG tablet Take 1 tablet (40 mg total) by mouth daily. 90 tablet 3  . Insulin Glargine (TOUJEO SOLOSTAR) 300 UNIT/ML SOPN Inject 150 Units into the skin every morning. Titrate up by 1 unit a day for goal sugars of 100-130 up to 40 units a day 32 pen 11  . losartan (COZAAR) 100 MG tablet Take 1 tablet (100 mg total) by mouth daily.  90 tablet 3  . metoprolol succinate (TOPROL XL) 25 MG 24 hr tablet Take 1 tablet (25 mg total) by mouth daily. 90 tablet 3  . potassium chloride (KLOR-CON) 8 MEQ tablet Take 2 tablets (16 mEq total) by mouth daily as needed. Use with Lasix 180 tablet 3  . tamsulosin (FLOMAX) 0.4 MG CAPS capsule Take 1 capsule (0.4 mg total) by mouth daily after supper. 90 capsule 3  . acyclovir (ZOVIRAX) 800 MG tablet Take 1 tablet (800 mg total) by mouth 5 (five) times daily. (Patient not taking: Reported on 06/21/2016) 50 tablet 0  . chlorpheniramine-HYDROcodone (TUSSIONEX PENNKINETIC ER) 10-8 MG/5ML SUER Take 5 mLs by mouth every 12  (twelve) hours as needed for cough. (Patient not taking: Reported on 06/21/2016) 115 mL 0  . diazepam (VALIUM) 5 MG tablet Take 1 tablet (5 mg total) by mouth every 6 (six) hours as needed for muscle spasms. (Patient not taking: Reported on 06/21/2016) 12 tablet 0  . hydrocortisone 2.5 % cream     . lubiprostone (AMITIZA) 24 MCG capsule Take 1 capsule (24 mcg total) by mouth 2 (two) times daily with a meal. (Patient not taking: Reported on 06/21/2016) 60 capsule 11  . Naproxen-Esomeprazole 500-20 MG TBEC Take 1 tablet by mouth 2 (two) times daily as needed. (Patient not taking: Reported on 06/21/2016) 180 tablet 2  . ondansetron (ZOFRAN) 4 MG tablet Take 1 tablet (4 mg total) by mouth every 6 (six) hours as needed for nausea. (Patient not taking: Reported on 06/21/2016) 65 tablet 0  . pravastatin (PRAVACHOL) 40 MG tablet Take 1 tablet (40 mg total) by mouth daily. (Patient not taking: Reported on 06/21/2016) 90 tablet 3  . rosuvastatin (CRESTOR) 20 MG tablet Take 1 tablet (20 mg total) by mouth daily. (Patient not taking: Reported on 06/21/2016) 90 tablet 3  . tiZANidine (ZANAFLEX) 4 MG tablet Take 1 tablet (4 mg total) by mouth at bedtime as needed for muscle spasms (and neck pain). (Patient not taking: Reported on 06/21/2016) 30 tablet 3   No current facility-administered medications on file prior to visit.     Allergies:  Allergies  Allergen Reactions  . Amoxicillin Nausea And Vomiting  . Lisinopril     Cough   . Penicillins     Per pt: unknown  . Simvastatin     myalgia    Review of Systems:  CONSTITUTIONAL: No fevers, chills, night sweats, or weight loss.  EYES: No visual changes or eye pain ENT: No hearing changes.  No history of nose bleeds.   RESPIRATORY: No cough, wheezing and shortness of breath.   CARDIOVASCULAR: Negative for chest pain, and palpitations.   GI: Negative for abdominal discomfort, blood in stools or black stools.  No recent change in bowel habits.   GU:  No history  of incontinence.   MUSCLOSKELETAL: No history of joint pain or swelling.  No myalgias.   SKIN: Negative for lesions, rash, and itching.   ENDOCRINE: Negative for cold or heat intolerance, polydipsia or goiter.   PSYCH:  No depression or anxiety symptoms.   NEURO: As Above.   Vital Signs:  BP 140/84   Pulse 70   Ht 5\' 10"  (1.778 m)   Wt 281 lb 5 oz (127.6 kg)   SpO2 95%   BMI 40.36 kg/m   Neurological Exam: MENTAL STATUS including orientation to time, place, person, recent and remote memory, attention span and concentration, language, and fund of knowledge is normal.  Speech is not dysarthric.  CRANIAL NERVES: Pupils equal round and reactive to light.    Face is symmetric.   MOTOR:  Motor strength is 5/5 in all extremities, including finger abductors/extensors/flexors.   Tone is normal.    MSRs:  Reflexes are 2+/4 in the upper extremities.  SENSORY:  Intact to vibration in the upper extremities  COORDINATION/GAIT:    Wide-based due to body habitus, stable and unassisted.  Data: MRI cervical spine wo contrast 10/02/2015:  1. Post-operative changes at C5-6.  No obvious acute or chronic complication. 2. Moderate central stenosis at C2-3 due to posterior soft tissue hypertrophy.  Less severe central stenosis at C4-5 3. Small disc bulge protrusion at T1-2.  IMPRESSION: 1.  Cervical canal stenosis at C2-3 and C4-5 causing left upper arm pain and paresthesias.   2.  History of cervical decompression and fusion at C5-6.  It is possible that his bilateral finger paresthesias are residual deficits from C6 canal stenosis.  I recommend electrodiagnostic testing of the arms to be sure an overlapping entrapment neuropathy has been evaluated for 3.  Mild peripheral neuropathy of the feet 4.  Morbid obesity 5.  Left shoulder pain, ?rotator cuff injury   PLAN/RECOMMENDATIONS:  1.  NCS/EMG of the arms 2.  Increase gabapentin 600mg  twice daily 3.  Continue flexeril 5mg  at bedtime 4.   Follow-up with PCP regarding left rotator cuff pain 5.  Weight loss and healthy diet encouraged  Return to clinic in 6 months  The duration of this appointment visit was 25 minutes of face-to-face time with the patient.  Greater than 50% of this time was spent in counseling, explanation of diagnosis, planning of further management, and coordination of care.   Thank you for allowing me to participate in patient's care.  If I can answer any additional questions, I would be pleased to do so.    Sincerely,    Benino Korinek K. Posey Pronto, DO

## 2016-06-24 ENCOUNTER — Ambulatory Visit (INDEPENDENT_AMBULATORY_CARE_PROVIDER_SITE_OTHER): Payer: Managed Care, Other (non HMO) | Admitting: Neurology

## 2016-06-24 DIAGNOSIS — M4802 Spinal stenosis, cervical region: Secondary | ICD-10-CM

## 2016-06-24 DIAGNOSIS — M5412 Radiculopathy, cervical region: Secondary | ICD-10-CM

## 2016-06-24 DIAGNOSIS — G5603 Carpal tunnel syndrome, bilateral upper limbs: Secondary | ICD-10-CM

## 2016-06-24 DIAGNOSIS — G5623 Lesion of ulnar nerve, bilateral upper limbs: Secondary | ICD-10-CM

## 2016-06-24 NOTE — Procedures (Signed)
[facility]  [place], [place]  [place], [place] [address] Tel: [phone] Fax:  [phone] Test Date:  06/24/2016  Patient: [name] [name] DOB: 12-16-1950 Physician: Narda Amber, DO  Sex: Male Height: 5\' 10"  Ref Phys: Narda Amber, DO  ID#: [id number] Temp: 34.4C Technician: Jerilynn Mages. Dean   Patient Complaints: This is a 66 year old gentleman with history of cervical decompression and fusion at C5-6 referred for evaluation of bilateral hand paresthesias.   NCV & EMG Findings: Extensive electrodiagnostic testing of the right upper extremity and additional studies of the left shows:  1. Right median sensory response shows prolonged latency (4.6 ms) and normal amplitude. Left mixed palmar responses are abnormal. Left median and bilateral ulnar sensory responses are within normal limits. 2. Right median motor response shows prolonged latency (4.7 ms) and normal amplitude. Left median motor responses within normal limits. Bilateral ulnar motor responses show slowed conduction velocity across the elbow (38 - 45 m/s) with normal latency and amplitude. 3. Chronic motor axon loss changes are seen affecting bilateral ulnar innervated muscles and the left pronator teres and biceps muscles. There is no evidence of accompanied active denervation.  Impression: 1. Bilateral ulnar neuropathy with slowing across the elbow, purely demyelinating in type. 2. Bilateral median neuropathy at the wrist, consistent with the clinical diagnosis of carpal tunnel syndrome. Overall, these findings are moderate in degree electrically on the right and very mild on the left. 3. Chronic C6 radiculopathy affecting the left upper extremity; moderate in degree electrically.   ___________________________ Narda Amber, DO    Nerve Conduction Studies Anti Sensory Summary Table   Stim Site NR Peak (ms) Norm Peak (ms) P-T Amp (V) Norm P-T Amp  Left Median Anti Sensory (2nd Digit)  34.4C  Wrist    3.5  <3.8 11.9 >10  Right Median Anti Sensory (2nd Digit)  34.4C  Wrist    4.6 <3.8 12.0 >10  Left Ulnar Anti Sensory (5th Digit)  34.4C  Wrist    3.0 <3.2 11.0 >5  Right Ulnar Anti Sensory (5th Digit)  34.4C  Wrist    3.2 <3.2 11.7 >5   Motor Summary Table   Stim Site NR Onset (ms) Norm Onset (ms) O-P Amp (mV) Norm O-P Amp Site1 Site2 Delta-0 (ms) Dist (cm) Vel (m/s) Norm Vel (m/s)  Left Median Motor (Abd Poll Brev)  34.4C  Wrist    3.7 <4.0 5.1 >5 Elbow Wrist 4.3 24.0 56 >50  Elbow    8.0  5.0         Right Median Motor (Abd Poll Brev)  34.4C  Wrist    4.7 <4.0 9.0 >5 Elbow Wrist 4.8 25.0 52 >50  Elbow    9.5  8.8         Left Ulnar Motor (Abd Dig Minimi)  34.4C  Wrist    2.9 <3.1 7.2 >7 B Elbow Wrist 3.9 24.0 62 >50  B Elbow    6.8  6.2  A Elbow B Elbow 2.2 10.0 45 >50  A Elbow    9.0  6.2         Right Ulnar Motor (Abd Dig Minimi)  34.4C  Wrist    3.0 <3.1 7.7 >7 B Elbow Wrist 4.1 24.0 59 >50  B Elbow    7.1  6.7  A Elbow B Elbow 2.6 10.0 38 >50  A Elbow    9.7  4.6          Comparison Summary Table  Stim Site NR Peak (ms) Norm Peak (ms) P-T Amp (V) Site1 Site2 Delta-P (ms) Norm Delta (ms)  Left Median/Ulnar Palm Comparison (Wrist - 8cm)  34.4C  Median Palm    2.2 <2.2 30.6 Median Palm Ulnar Palm 0.0   Ulnar Palm    2.2 <2.2 10.6       EMG   Side Muscle Ins Act Fibs Psw Fasc Number Recrt Dur Dur. Amp Amp. Poly Poly. Comment  Left FlexDigProf 4,5 Nml Nml Nml Nml 1- Rapid Some 1+ Some 1+ Nml Nml N/A  Left PronatorTeres Nml Nml Nml Nml 2- Rapid Many 1+ Many 1+ Nml Nml N/A  Left 1stDorInt Nml Nml Nml Nml 1- Rapid Few 1+ Few 1+ Nml Nml N/A  Left Abd Poll Brev Nml Nml Nml Nml Nml Nml Nml Nml Nml Nml Nml Nml N/A  Left Ext Indicis Nml Nml Nml Nml Nml Nml Nml Nml Nml Nml Nml Nml N/A  Left Triceps Nml Nml Nml Nml Nml Nml Nml Nml Nml Nml Nml Nml N/A  Left Biceps Nml Nml Nml Nml 2- Rapid Many 1+ Many 1+ Nml Nml N/A  Left Deltoid Nml Nml Nml Nml Nml Nml Nml Nml Nml Nml Nml Nml  N/A  Left ABD Dig Min Nml Nml Nml Nml 2- Rapid Some 1+ Some 1+ Nml Nml N/A  Right FlexPolLong Nml Nml Nml Nml Nml Nml Nml Nml Nml Nml Nml Nml N/A  Right 1stDorInt Nml Nml Nml Nml 1- Rapid Some 1+ Some 1+ Nml Nml N/A  Right PronatorTeres Nml Nml Nml Nml Nml Nml Nml Nml Nml Nml Nml Nml N/A  Right FlexDigProf 4,5 Nml Nml Nml Nml 1- Rapid Some 1+ Few 1+ Nml Nml N/A  Right Triceps Nml Nml Nml Nml Nml Nml Nml Nml Nml Nml Nml Nml N/A  Right Deltoid Nml Nml Nml Nml Nml Nml Nml Nml Nml Nml Nml Nml N/A  Right Ext Indicis Nml Nml Nml Nml Nml Nml Nml Nml Nml Nml Nml Nml N/A  Right Biceps Nml Nml Nml Nml Nml Nml Nml Nml Nml Nml Nml Nml N/A  Right ABD Dig Min Nml Nml Nml Nml 1- Rapid Some 1+ Some 1+ Nml Nml N/A      Waveforms:

## 2016-06-24 NOTE — Patient Instructions (Signed)
You have ulnar nerve entrapment across the elbow in both arms.  Start using a soft elbow pad to prevent repetitive injury to the arm and at night time, place the foam piece along the inner elbow to serve as a block and avoid over flexion of the arm.  You have mild carpal tunnel in both hands, worse on the right.   Cubital Tunnel Syndrome/Ulnar nerve entrapment at the elbow Introduction Cubital tunnel syndrome is a condition that causes pain and weakness of the forearm and hand. This condition happens when one of the nerves (ulnar nerve) that runs alongside the elbow joint becomes irritated. What are the causes? Causes of this condition include:  Increased pressure on the ulnar nerve at the elbow, arm, or forearm. This can be caused by:  Swollen tissues.  Ligaments.  Muscles.  Poorly healed elbow fractures.  Tumors in the elbow. These are usually noncancerous (benign).  Scar tissue that develops in the elbow after an injury.  Bony growths (spurs) near the ulnar nerve.  Stretching of the nerve due to loose elbow ligaments.  Trauma to the nerve at the elbow.  Repetitive elbow bending.  Certain medical conditions. What increases the risk? This condition is more likely to develop in:  People who do manual labor that requires frequently bending the elbow.  People who play sports that include repeated or strenuous throwing motions, such as baseball.  People who play contact sports, such as football or lacrosse.  People who do not warm up properly before activities.  People who have diabetes.  People who have an underactive thyroid (hypothyroidism). What are the signs or symptoms? Symptoms of this condition include:  Clumsiness or weakness of the hand.  Tenderness of the inner elbow.  Aching or soreness of the inner elbow, forearm, or fingers, especially the little finger or the ring finger.  Increased pain with forced elbow bending.  Reduced control when  throwing.  Tingling, numbness, or burning inside the forearm, or in part of the hand or fingers, especially the little finger or the ring finger.  Sharp pains that shoot from the elbow down to the wrist and hand.  The inability to grip or pinch hard. How is this diagnosed? This condition is diagnosed with a medical history and physical exam. Your health care provider will ask about your symptoms and ask for details about any injury. You may also have other tests, including:  Electromyogram (EMG). This test checks how well the nerve is working.  X-ray. How is this treated? Treatment starts by stopping the activities that are causing your symptoms to get worse. Treatment may include the use of icing and medicines to reduce pain and swelling. You may also be advised to wear a splint to prevent your elbow from bending or wear an elbow pad where the ulnar nerve is closest to the skin. In less severe cases, treatment may also include working with a physical therapist:  To help decrease your symptoms.  To improve the strength and range of motion of your elbow, forearm, and hand. If the treatments described above do not help, surgery may be needed. Follow these instructions at home: If you have a splint:  Wear it as told by your health care provider. Remove it only as told by your health care provider.  Loosen the splint if your fingers become numb and tingle, or if they turn cold and blue.  Keep the splint clean and dry. Managing pain, stiffness, and swelling  If directed, apply ice  to the injured area:  Put ice in a plastic bag.  Place a towel between your skin and the bag.  Leave the ice on for 20 minutes, 2-3 times per day.  Move your fingers often to avoid stiffness and to lessen swelling.  Raise (elevate) the injured area above the level of your heart while you are sitting or lying down. General instructions  Take over-the-counter and prescription medicines only as told by  your health care provider.  Keep all follow-up visits as told by your health care provider. This is important.  Do any exercise or physical therapy as told by your health care provider.  Do not drive or operate heavy machinery while taking prescription pain medicine.  If you were given an elbow pad, wear it as told by your health care provider. Contact a health care provider if:  Your symptoms get worse.  Your symptoms do not get better with treatment.  Your have new pain.  Your hand on the injured side feels numb or cold. This information is not intended to replace advice given to you by your health care provider. Make sure you discuss any questions you have with your health care provider. Document Released: 04/26/2005 Document Revised: 10/02/2015 Document Reviewed: 07/03/2014  2017 [employer]vier

## 2016-07-02 ENCOUNTER — Encounter: Payer: Self-pay | Admitting: Internal Medicine

## 2016-07-02 NOTE — Progress Notes (Signed)
[facility] Skin biopsy abstracted.

## 2016-07-04 ENCOUNTER — Telehealth: Payer: Self-pay | Admitting: Internal Medicine

## 2016-07-04 NOTE — Telephone Encounter (Signed)
Hi [name] [name], Can you pease call Pathology lab office manager and ask why I'm getting dermatopathology results under my name (the biopsies were done by Dermatology and I can't assume any responsibility to f/u on the report). hey should be directing reports to Dermatology Thank you, AP

## 2016-07-09 NOTE — Telephone Encounter (Signed)
Inbox to Dr. Alain Marion that this is an abstraction of the results into EPIC, no requirement for him to follow up.

## 2016-07-20 ENCOUNTER — Other Ambulatory Visit: Payer: Self-pay | Admitting: Internal Medicine

## 2016-07-27 ENCOUNTER — Other Ambulatory Visit (INDEPENDENT_AMBULATORY_CARE_PROVIDER_SITE_OTHER): Payer: Managed Care, Other (non HMO)

## 2016-07-27 DIAGNOSIS — E1165 Type 2 diabetes mellitus with hyperglycemia: Secondary | ICD-10-CM | POA: Diagnosis not present

## 2016-07-27 DIAGNOSIS — Z794 Long term (current) use of insulin: Secondary | ICD-10-CM | POA: Diagnosis not present

## 2016-07-27 LAB — BASIC METABOLIC PANEL
BUN: 27 mg/dL — AB (ref 6–23)
CO2: 29 meq/L (ref 19–32)
Calcium: 9.2 mg/dL (ref 8.4–10.5)
Chloride: 101 mEq/L (ref 96–112)
Creatinine, Ser: 1.16 mg/dL (ref 0.40–1.50)
GFR: 66.96 mL/min (ref >60.00)
Glucose, Bld: 109 mg/dL — ABNORMAL HIGH (ref 70–99)
POTASSIUM: 4.3 meq/L (ref 3.5–5.1)
SODIUM: 138 meq/L (ref 135–145)

## 2016-07-27 LAB — HEMOGLOBIN A1C: Hgb A1c MFr Bld: 7.1 % — ABNORMAL HIGH (ref 4.6–6.5)

## 2016-08-13 ENCOUNTER — Ambulatory Visit (INDEPENDENT_AMBULATORY_CARE_PROVIDER_SITE_OTHER): Payer: Managed Care, Other (non HMO) | Admitting: Internal Medicine

## 2016-08-13 ENCOUNTER — Encounter: Payer: Self-pay | Admitting: Internal Medicine

## 2016-08-13 VITALS — BP 136/82 | HR 60 | Temp 97.6°F | Ht 69.0 in | Wt 273.0 lb

## 2016-08-13 DIAGNOSIS — E1165 Type 2 diabetes mellitus with hyperglycemia: Secondary | ICD-10-CM | POA: Diagnosis not present

## 2016-08-13 DIAGNOSIS — I2510 Atherosclerotic heart disease of native coronary artery without angina pectoris: Secondary | ICD-10-CM

## 2016-08-13 DIAGNOSIS — I10 Essential (primary) hypertension: Secondary | ICD-10-CM

## 2016-08-13 DIAGNOSIS — Z794 Long term (current) use of insulin: Secondary | ICD-10-CM

## 2016-08-13 MED ORDER — HYDROCOD POLST-CPM POLST ER 10-8 MG/5ML PO SUER: 5.0000 mL | Freq: Two times a day (BID) | ORAL | 0 refills | Status: DC | PRN

## 2016-08-13 MED ORDER — NAPROXEN-ESOMEPRAZOLE 500-20 MG PO TBEC: 1.0000 | DELAYED_RELEASE_TABLET | Freq: Two times a day (BID) | ORAL | 2 refills | Status: DC | PRN

## 2016-08-13 NOTE — Assessment & Plan Note (Signed)
Better w/wtloss Titrate down Toujeo

## 2016-08-13 NOTE — Assessment & Plan Note (Signed)
Toprol XL, ASA, Amlodipine, Losartan

## 2016-08-13 NOTE — Assessment & Plan Note (Signed)
Better He did not go to Bariatric surgery Advised to see Dr Leafy Ro

## 2016-08-13 NOTE — Progress Notes (Signed)
Subjective:  Patient ID: [name] [name], male    DOB: 12-Feb-1951  Age: 66 y.o. MRN: [id number]  CC: No chief complaint on file.   HPI [name] [name] presents for obesity, DM, HTN f/u. Lost 8 lbs  Outpatient Medications Prior to Visit  Medication Sig Dispense Refill  . amLODipine (NORVASC) 5 MG tablet Take 1 tablet (5 mg total) by mouth daily. 90 tablet 3  . aspirin 81 MG tablet Take 81 mg by mouth daily.      . B-D ULTRAFINE III SHORT PEN 31G X 8 MM MISC USE AS DIRECTED 200 each 3  . chlorpheniramine-HYDROcodone (TUSSIONEX PENNKINETIC ER) 10-8 MG/5ML SUER Take 5 mLs by mouth every 12 (twelve) hours as needed for cough. 115 mL 0  . cholecalciferol (VITAMIN D) 1000 units tablet Take 3,000 Units by mouth daily.    . cyclobenzaprine (FLEXERIL) 5 MG tablet Take 1 tablet (5 mg total) by mouth at bedtime. 90 tablet 3  . diazepam (VALIUM) 5 MG tablet Take 1 tablet (5 mg total) by mouth every 6 (six) hours as needed for muscle spasms. 12 tablet 0  . furosemide (LASIX) 40 MG tablet Take 1 tablet (40 mg total) by mouth daily. 90 tablet 3  . gabapentin (NEURONTIN) 600 MG tablet Take 1 tablet (600 mg total) by mouth 2 (two) times daily. 240 tablet 3  . hydrocortisone 2.5 % cream     . Insulin Glargine (TOUJEO SOLOSTAR) 300 UNIT/ML SOPN Inject 150 Units into the skin every morning. Titrate up by 1 unit a day for goal sugars of 100-130 up to 40 units a day 32 pen 11  . JARDIANCE 10 MG TABS tablet TAKE 1 TABLET BY MOUTH DAILY 30 tablet 5  . losartan (COZAAR) 100 MG tablet Take 1 tablet (100 mg total) by mouth daily. 90 tablet 3  . lubiprostone (AMITIZA) 24 MCG capsule Take 1 capsule (24 mcg total) by mouth 2 (two) times daily with a meal. 60 capsule 11  . metoprolol succinate (TOPROL XL) 25 MG 24 hr tablet Take 1 tablet (25 mg total) by mouth daily. 90 tablet 3  . Naproxen-Esomeprazole 500-20 MG TBEC Take 1 tablet by mouth 2 (two) times daily as needed. 180 tablet 2  . ondansetron (ZOFRAN) 4 MG tablet  Take 1 tablet (4 mg total) by mouth every 6 (six) hours as needed for nausea. 65 tablet 0  . potassium chloride (KLOR-CON) 8 MEQ tablet Take 2 tablets (16 mEq total) by mouth daily as needed. Use with Lasix 180 tablet 3  . pravastatin (PRAVACHOL) 40 MG tablet Take 1 tablet (40 mg total) by mouth daily. 90 tablet 3  . rosuvastatin (CRESTOR) 20 MG tablet Take 1 tablet (20 mg total) by mouth daily. 90 tablet 3  . tamsulosin (FLOMAX) 0.4 MG CAPS capsule Take 1 capsule (0.4 mg total) by mouth daily after supper. 90 capsule 3  . tiZANidine (ZANAFLEX) 4 MG tablet Take 1 tablet (4 mg total) by mouth at bedtime as needed for muscle spasms (and neck pain). 30 tablet 3  . acyclovir (ZOVIRAX) 800 MG tablet Take 1 tablet (800 mg total) by mouth 5 (five) times daily. (Patient not taking: Reported on 06/21/2016) 50 tablet 0   No facility-administered medications prior to visit.     ROS Review of Systems  Constitutional: Negative for appetite change, fatigue and unexpected weight change.  HENT: Negative for congestion, nosebleeds, sneezing, sore throat and trouble swallowing.   Eyes: Negative for itching and visual disturbance.  Respiratory: Negative for cough.   Cardiovascular: Negative for chest pain, palpitations and leg swelling.  Gastrointestinal: Negative for abdominal distention, blood in stool, diarrhea and nausea.  Genitourinary: Negative for frequency and hematuria.  Musculoskeletal: Positive for arthralgias. Negative for back pain, gait problem, joint swelling and neck pain.  Skin: Negative for rash.  Neurological: Negative for dizziness, tremors, speech difficulty and weakness.  Psychiatric/Behavioral: Negative for agitation, dysphoric mood, sleep disturbance and suicidal ideas. The patient is not nervous/anxious.     Objective:  BP 136/82   Pulse 60   Temp 97.6 F (36.4 C)   Ht 5\' 9"  (1.753 m)   Wt 273 lb (123.8 kg)   SpO2 99%   BMI 40.32 kg/m   BP Readings from Last 3 Encounters:    08/13/16 136/82  06/21/16 140/84  05/14/16 (!) 160/88    Wt Readings from Last 3 Encounters:  08/13/16 273 lb (123.8 kg)  06/21/16 281 lb 5 oz (127.6 kg)  05/14/16 300 lb (136.1 kg)    Physical Exam  Constitutional: He is oriented to person, place, and time. He appears well-developed. No distress.  NAD  HENT:  Mouth/Throat: Oropharynx is clear and moist.  Eyes: Conjunctivae are normal. Pupils are equal, round, and reactive to light.  Neck: Normal range of motion. No JVD present. No thyromegaly present.  Cardiovascular: Normal rate, regular rhythm, normal heart sounds and intact distal pulses.  Exam reveals no gallop and no friction rub.   No murmur heard. Pulmonary/Chest: Effort normal and breath sounds normal. No respiratory distress. He has no wheezes. He has no rales. He exhibits no tenderness.  Abdominal: Soft. Bowel sounds are normal. He exhibits no distension and no mass. There is no tenderness. There is no rebound and no guarding.  Musculoskeletal: Normal range of motion. He exhibits tenderness. He exhibits no edema.  Lymphadenopathy:    He has no cervical adenopathy.  Neurological: He is alert and oriented to person, place, and time. He has normal reflexes. No cranial nerve deficit. He exhibits normal muscle tone. He displays a negative Romberg sign. Coordination and gait normal.  Skin: Skin is warm and dry. No rash noted.  Psychiatric: He has a normal mood and affect. His behavior is normal. Judgment and thought content normal.   Obese  Lab Results  Component Value Date   WBC 8.8 02/17/2016   HGB 14.6 02/17/2016   HCT 43.4 02/17/2016   PLT 338.0 02/17/2016   GLUCOSE 109 (H) 07/27/2016   CHOL 285 (H) 02/17/2016   TRIG 309.0 (H) 02/17/2016   HDL 35.80 (L) 02/17/2016   LDLDIRECT 200.0 02/17/2016   LDLCALC 139 (H) 04/09/2015   ALT 23 02/17/2016   AST 15 02/17/2016   NA 138 07/27/2016   K 4.3 07/27/2016   CL 101 07/27/2016   CREATININE 1.16 07/27/2016   BUN 27  (H) 07/27/2016   CO2 29 07/27/2016   TSH 2.76 02/17/2016   PSA 0.80 02/17/2016   INR 1.08 02/02/2011   HGBA1C 7.1 (H) 07/27/2016   MICROALBUR 195.1 (H) 02/17/2016    No results found.  Assessment & Plan:   There are no diagnoses linked to this encounter. I am having Mr. Howton maintain his aspirin, ondansetron, acyclovir, lubiprostone, tiZANidine, rosuvastatin, Naproxen-Esomeprazole, hydrocortisone, cholecalciferol, diazepam, Insulin Glargine, metoprolol succinate, amLODipine, furosemide, losartan, potassium chloride, tamsulosin, pravastatin, B-D ULTRAFINE III SHORT PEN, chlorpheniramine-HYDROcodone, gabapentin, cyclobenzaprine, JARDIANCE, PENNSAID, and fluorouracil.  Meds ordered this encounter  Medications  . PENNSAID 2 % SOLN    Sig:  PLACE ONE application onto SKIN TWICE DAILY AS NEEDED    Refill:  2  . fluorouracil (EFUDEX) 5 % cream     Follow-up: No Follow-up on file.  Walker Kehr, MD

## 2016-08-13 NOTE — Assessment & Plan Note (Signed)
Toprol XL, ASA, Amlodipine, Crestor 

## 2016-10-28 ENCOUNTER — Encounter (INDEPENDENT_AMBULATORY_CARE_PROVIDER_SITE_OTHER): Payer: Self-pay | Admitting: Family Medicine

## 2016-11-19 ENCOUNTER — Other Ambulatory Visit (INDEPENDENT_AMBULATORY_CARE_PROVIDER_SITE_OTHER): Payer: Managed Care, Other (non HMO)

## 2016-11-19 ENCOUNTER — Encounter: Payer: Self-pay | Admitting: Internal Medicine

## 2016-11-19 ENCOUNTER — Ambulatory Visit (INDEPENDENT_AMBULATORY_CARE_PROVIDER_SITE_OTHER): Payer: Managed Care, Other (non HMO) | Admitting: Internal Medicine

## 2016-11-19 DIAGNOSIS — E1165 Type 2 diabetes mellitus with hyperglycemia: Secondary | ICD-10-CM | POA: Diagnosis not present

## 2016-11-19 DIAGNOSIS — I10 Essential (primary) hypertension: Secondary | ICD-10-CM | POA: Diagnosis not present

## 2016-11-19 DIAGNOSIS — Z794 Long term (current) use of insulin: Secondary | ICD-10-CM | POA: Diagnosis not present

## 2016-11-19 DIAGNOSIS — R198 Other specified symptoms and signs involving the digestive system and abdomen: Secondary | ICD-10-CM | POA: Diagnosis not present

## 2016-11-19 DIAGNOSIS — K429 Umbilical hernia without obstruction or gangrene: Secondary | ICD-10-CM | POA: Diagnosis not present

## 2016-11-19 DIAGNOSIS — G5603 Carpal tunnel syndrome, bilateral upper limbs: Secondary | ICD-10-CM

## 2016-11-19 DIAGNOSIS — G5600 Carpal tunnel syndrome, unspecified upper limb: Secondary | ICD-10-CM | POA: Insufficient documentation

## 2016-11-19 LAB — BASIC METABOLIC PANEL
BUN: 28 mg/dL — ABNORMAL HIGH (ref 6–23)
CALCIUM: 9.5 mg/dL (ref 8.4–10.5)
CO2: 30 mEq/L (ref 19–32)
Chloride: 100 mEq/L (ref 96–112)
Creatinine, Ser: 1.32 mg/dL (ref 0.40–1.50)
GFR: 57.63 mL/min — AB (ref >60.00)
GLUCOSE: 126 mg/dL — AB (ref 70–99)
POTASSIUM: 4.9 meq/L (ref 3.5–5.1)
SODIUM: 139 meq/L (ref 135–145)

## 2016-11-19 MED ORDER — HYDROCORTISONE ACETATE 25 MG RE SUPP: 25.0000 mg | Freq: Every day | RECTAL | 1 refills | Status: DC

## 2016-11-19 NOTE — Assessment & Plan Note (Signed)
Surgical ref discussed - small hernia 2 cm

## 2016-11-19 NOTE — Assessment & Plan Note (Signed)
Toujeo Jardiance  Potential benefits of a long term Jardiance use as well as potential risks  and complications were explained to the patient and were aknowledged. 

## 2016-11-19 NOTE — Assessment & Plan Note (Signed)
Mucus - Anusol HC Colon 2014 GI ref if issues Diet w/less fat

## 2016-11-19 NOTE — Assessment & Plan Note (Addendum)
Surgery ref discussed and suggested

## 2016-11-19 NOTE — Assessment & Plan Note (Signed)
Toprol XL, ASA, Amlodipine, Losartan

## 2016-11-19 NOTE — Progress Notes (Signed)
Subjective:  Patient ID: [name] [name], male    DOB: [birth year]  Age: 66 y.o. MRN: [id number]  CC: No chief complaint on file.   HPI [name] [name] presents for HTN, obesity, DM f/u. C/o "wet" anal area in am's  Colon 2014  Outpatient Medications Prior to Visit  Medication Sig Dispense Refill  . acyclovir (ZOVIRAX) 800 MG tablet Take 1 tablet (800 mg total) by mouth 5 (five) times daily. 50 tablet 0  . amLODipine (NORVASC) 5 MG tablet Take 1 tablet (5 mg total) by mouth daily. 90 tablet 3  . aspirin 81 MG tablet Take 81 mg by mouth daily.      . B-D ULTRAFINE III SHORT PEN 31G X 8 MM MISC USE AS DIRECTED 200 each 3  . chlorpheniramine-HYDROcodone (TUSSIONEX PENNKINETIC ER) 10-8 MG/5ML SUER Take 5 mLs by mouth every 12 (twelve) hours as needed for cough. 115 mL 0  . cholecalciferol (VITAMIN D) 1000 units tablet Take 3,000 Units by mouth daily.    . cyclobenzaprine (FLEXERIL) 5 MG tablet Take 1 tablet (5 mg total) by mouth at bedtime. 90 tablet 3  . diazepam (VALIUM) 5 MG tablet Take 1 tablet (5 mg total) by mouth every 6 (six) hours as needed for muscle spasms. 12 tablet 0  . fluorouracil (EFUDEX) 5 % cream     . furosemide (LASIX) 40 MG tablet Take 1 tablet (40 mg total) by mouth daily. 90 tablet 3  . gabapentin (NEURONTIN) 600 MG tablet Take 1 tablet (600 mg total) by mouth 2 (two) times daily. 240 tablet 3  . hydrocortisone 2.5 % cream     . Insulin Glargine (TOUJEO SOLOSTAR) 300 UNIT/ML SOPN Inject 150 Units into the skin every morning. Titrate up by 1 unit a day for goal sugars of 100-130 up to 40 units a day 32 pen 11  . JARDIANCE 10 MG TABS tablet TAKE 1 TABLET BY MOUTH DAILY 30 tablet 5  . losartan (COZAAR) 100 MG tablet Take 1 tablet (100 mg total) by mouth daily. 90 tablet 3  . lubiprostone (AMITIZA) 24 MCG capsule Take 1 capsule (24 mcg total) by mouth 2 (two) times daily with a meal. 60 capsule 11  . metoprolol succinate (TOPROL XL) 25 MG 24 hr tablet Take 1 tablet (25 mg  total) by mouth daily. 90 tablet 3  . Naproxen-Esomeprazole 500-20 MG TBEC Take 1 tablet by mouth 2 (two) times daily as needed. 180 tablet 2  . ondansetron (ZOFRAN) 4 MG tablet Take 1 tablet (4 mg total) by mouth every 6 (six) hours as needed for nausea. 65 tablet 0  . PENNSAID 2 % SOLN PLACE ONE application onto SKIN TWICE DAILY AS NEEDED  2  . pravastatin (PRAVACHOL) 40 MG tablet Take 1 tablet (40 mg total) by mouth daily. 90 tablet 3  . rosuvastatin (CRESTOR) 20 MG tablet Take 1 tablet (20 mg total) by mouth daily. 90 tablet 3  . tamsulosin (FLOMAX) 0.4 MG CAPS capsule Take 1 capsule (0.4 mg total) by mouth daily after supper. 90 capsule 3  . tiZANidine (ZANAFLEX) 4 MG tablet Take 1 tablet (4 mg total) by mouth at bedtime as needed for muscle spasms (and neck pain). 30 tablet 3   No facility-administered medications prior to visit.     ROS Review of Systems  Constitutional: Negative for appetite change, fatigue and unexpected weight change.  HENT: Negative for congestion, nosebleeds, sneezing, sore throat and trouble swallowing.   Eyes: Negative for itching and visual  disturbance.  Respiratory: Negative for cough.   Cardiovascular: Negative for chest pain, palpitations and leg swelling.  Gastrointestinal: Negative for abdominal distention, blood in stool, diarrhea and nausea.  Genitourinary: Negative for frequency and hematuria.  Musculoskeletal: Positive for back pain. Negative for gait problem, joint swelling and neck pain.  Skin: Negative for rash.  Neurological: Negative for dizziness, tremors, speech difficulty and weakness.  Psychiatric/Behavioral: Negative for agitation, dysphoric mood, sleep disturbance and suicidal ideas. The patient is not nervous/anxious.     Objective:  BP 132/80 (BP Location: Left Arm, Patient Position: Sitting, Cuff Size: Large)   Pulse 71   Temp 98.6 F (37 C) (Oral)   Ht 5\' 9"  (1.753 m)   Wt 276 lb (125.2 kg)   SpO2 98%   BMI 40.76 kg/m   BP  Readings from Last 3 Encounters:  11/19/16 132/80  08/13/16 136/82  06/21/16 140/84    Wt Readings from Last 3 Encounters:  11/19/16 276 lb (125.2 kg)  08/13/16 273 lb (123.8 kg)  06/21/16 281 lb 5 oz (127.6 kg)    Physical Exam  Constitutional: He is oriented to person, place, and time. He appears well-developed. No distress.  NAD  HENT:  Mouth/Throat: Oropharynx is clear and moist.  Eyes: Pupils are equal, round, and reactive to light. Conjunctivae are normal.  Neck: Normal range of motion. No JVD present. No thyromegaly present.  Cardiovascular: Normal rate, regular rhythm, normal heart sounds and intact distal pulses.  Exam reveals no gallop and no friction rub.   No murmur heard. Pulmonary/Chest: Effort normal and breath sounds normal. No respiratory distress. He has no wheezes. He has no rales. He exhibits no tenderness.  Abdominal: Soft. Bowel sounds are normal. He exhibits no distension and no mass. There is no tenderness. There is no rebound and no guarding.  Musculoskeletal: Normal range of motion. He exhibits tenderness. He exhibits no edema.  Lymphadenopathy:    He has no cervical adenopathy.  Neurological: He is alert and oriented to person, place, and time. He has normal reflexes. No cranial nerve deficit. He exhibits normal muscle tone. He displays a negative Romberg sign. Coordination and gait normal.  Skin: Skin is warm and dry. No rash noted.  Psychiatric: He has a normal mood and affect. His behavior is normal. Judgment and thought content normal.  Obese Small umbilical  hernia 2 cm Anal area WNL and w/tags  Lab Results  Component Value Date   WBC 8.8 02/17/2016   HGB 14.6 02/17/2016   HCT 43.4 02/17/2016   PLT 338.0 02/17/2016   GLUCOSE 109 (H) 07/27/2016   CHOL 285 (H) 02/17/2016   TRIG 309.0 (H) 02/17/2016   HDL 35.80 (L) 02/17/2016   LDLDIRECT 200.0 02/17/2016   LDLCALC 139 (H) 04/09/2015   ALT 23 02/17/2016   AST 15 02/17/2016   NA 138 07/27/2016    K 4.3 07/27/2016   CL 101 07/27/2016   CREATININE 1.16 07/27/2016   BUN 27 (H) 07/27/2016   CO2 29 07/27/2016   TSH 2.76 02/17/2016   PSA 0.80 02/17/2016   INR 1.08 02/02/2011   HGBA1C 7.1 (H) 07/27/2016   MICROALBUR 195.1 (H) 02/17/2016    No results found.  Assessment & Plan:   There are no diagnoses linked to this encounter. I am having Mr. Eveleth maintain his aspirin, ondansetron, acyclovir, lubiprostone, tiZANidine, rosuvastatin, hydrocortisone, cholecalciferol, diazepam, Insulin Glargine, metoprolol succinate, amLODipine, furosemide, losartan, tamsulosin, pravastatin, B-D ULTRAFINE III SHORT PEN, gabapentin, cyclobenzaprine, JARDIANCE, PENNSAID, fluorouracil, Naproxen-Esomeprazole, and  chlorpheniramine-HYDROcodone.  No orders of the defined types were placed in this encounter.    Follow-up: No Follow-up on file.  Walker Kehr, MD

## 2016-12-09 ENCOUNTER — Encounter: Payer: Self-pay | Admitting: Internal Medicine

## 2016-12-09 ENCOUNTER — Ambulatory Visit (INDEPENDENT_AMBULATORY_CARE_PROVIDER_SITE_OTHER): Payer: Managed Care, Other (non HMO) | Admitting: Internal Medicine

## 2016-12-09 VITALS — BP 138/90 | HR 75 | Ht 69.0 in | Wt 279.0 lb

## 2016-12-09 DIAGNOSIS — R55 Syncope and collapse: Secondary | ICD-10-CM | POA: Diagnosis not present

## 2016-12-09 DIAGNOSIS — Z794 Long term (current) use of insulin: Secondary | ICD-10-CM

## 2016-12-09 DIAGNOSIS — I10 Essential (primary) hypertension: Secondary | ICD-10-CM

## 2016-12-09 DIAGNOSIS — E1165 Type 2 diabetes mellitus with hyperglycemia: Secondary | ICD-10-CM

## 2016-12-09 LAB — POCT GLYCOSYLATED HEMOGLOBIN (HGB A1C): Hemoglobin A1C: 6.1

## 2016-12-09 NOTE — Patient Instructions (Signed)
Please decrease the Toujeo to 80 units only per day  Please continue to monitor your blood sugars and call with results early next wk  Please continue all other medications as before, and refills have been done if requested.  Please have the pharmacy call with any other refills you may need.  Please keep your appointments with your specialists as you may have planned
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
## 2016-12-09 NOTE — Assessment & Plan Note (Addendum)
D/w pt importance of compliance with med dosing and checking cbg's; he clearly has overcontrolled sugars in last few days and likely directly assoc with his aggressive confusion episode recently; pt needs to only take toujeo and jardiance as instructed, so for now to take only 80 units toujeo, cont jardiance, cont dm diet and wt control efforts; to check cbgs and report findings in 3-5 days

## 2016-12-09 NOTE — Assessment & Plan Note (Signed)
stable overall by history and exam, recent data reviewed with pt, and pt to continue medical treatment as before,  to f/u any worsening symptoms or concerns BP Readings from Last 3 Encounters:  12/09/16 138/90  11/19/16 132/80  08/13/16 136/82

## 2016-12-09 NOTE — Assessment & Plan Note (Addendum)
Etiology unclear, declines further evaluation but seems likely associated with low volume related to working in heat for 3 days while taking lasix and current meds; likely improved now by exam, no orthostatic symptoms or other CV symptoms or fever; to continue to monitor and be aware of low volume with heat exposure  Note:  Total time for pt hx, exam, review of record with pt in the room, determination of diagnoses and plan for further eval and tx is > 40 min, with over 50% spent in coordination and counseling of patient, including the differential diagnosis, further evaluation and tx of syncope, overcontrolled DM and HTN

## 2016-12-20 ENCOUNTER — Ambulatory Visit: Payer: Managed Care, Other (non HMO) | Admitting: Neurology

## 2016-12-31 ENCOUNTER — Telehealth: Payer: Self-pay | Admitting: Internal Medicine

## 2016-12-31 NOTE — Telephone Encounter (Signed)
The pt's insurance company [employer] called stating that the pt was seen by Dr Jenny Reichmann on 12/09/2016 because Dr Alain Marion was not able to see him due to his schedule. His insurance is requiring a referral to Dr Jenny Reichmann for this date of service so that the office visit will be covered. The referral can be faxed to [facility] at [phone].

## 2017-01-03 NOTE — Telephone Encounter (Signed)
No need for referral. I talked with Rose at [facility] and there was an issue with the tax ID and the issue should be taken care of.  She will resubmit the office visit.

## 2017-01-19 ENCOUNTER — Other Ambulatory Visit: Payer: Self-pay | Admitting: Internal Medicine

## 2017-01-19 NOTE — Telephone Encounter (Signed)
a1c checked in march/2018---abnormal reading---are you ok with sending refill or do you need to see patient again--please advise, thanks

## 2017-01-26 LAB — HM DIABETES EYE EXAM

## 2017-01-27 ENCOUNTER — Encounter: Payer: Self-pay | Admitting: Internal Medicine

## 2017-02-21 ENCOUNTER — Ambulatory Visit: Payer: Managed Care, Other (non HMO) | Admitting: Internal Medicine

## 2017-02-28 ENCOUNTER — Ambulatory Visit (INDEPENDENT_AMBULATORY_CARE_PROVIDER_SITE_OTHER): Payer: Managed Care, Other (non HMO) | Admitting: Internal Medicine

## 2017-02-28 ENCOUNTER — Encounter: Payer: Self-pay | Admitting: Internal Medicine

## 2017-02-28 ENCOUNTER — Other Ambulatory Visit (INDEPENDENT_AMBULATORY_CARE_PROVIDER_SITE_OTHER): Payer: Managed Care, Other (non HMO)

## 2017-02-28 VITALS — BP 140/82 | HR 75 | Temp 98.0°F | Ht 69.0 in | Wt 278.0 lb

## 2017-02-28 DIAGNOSIS — E1169 Type 2 diabetes mellitus with other specified complication: Secondary | ICD-10-CM

## 2017-02-28 DIAGNOSIS — I10 Essential (primary) hypertension: Secondary | ICD-10-CM | POA: Diagnosis not present

## 2017-02-28 DIAGNOSIS — Z23 Encounter for immunization: Secondary | ICD-10-CM

## 2017-02-28 DIAGNOSIS — R600 Localized edema: Secondary | ICD-10-CM | POA: Diagnosis not present

## 2017-02-28 DIAGNOSIS — Z794 Long term (current) use of insulin: Secondary | ICD-10-CM

## 2017-02-28 LAB — BASIC METABOLIC PANEL
BUN: 34 mg/dL — AB (ref 6–23)
CALCIUM: 9 mg/dL (ref 8.4–10.5)
CHLORIDE: 100 meq/L (ref 96–112)
CO2: 27 mEq/L (ref 19–32)
Creatinine, Ser: 1.32 mg/dL (ref 0.40–1.50)
GFR: 57.58 mL/min — ABNORMAL LOW (ref >60.00)
Glucose, Bld: 188 mg/dL — ABNORMAL HIGH (ref 70–99)
Potassium: 4.1 mEq/L (ref 3.5–5.1)
Sodium: 136 mEq/L (ref 135–145)

## 2017-02-28 LAB — HEMOGLOBIN A1C: HEMOGLOBIN A1C: 8.2 % — AB (ref 4.6–6.5)

## 2017-02-28 MED ORDER — HYDROCOD POLST-CPM POLST ER 10-8 MG/5ML PO SUER: 5.0000 mL | Freq: Two times a day (BID) | ORAL | 0 refills | Status: DC | PRN

## 2017-02-28 NOTE — Addendum Note (Signed)
Addended by: Karren Cobble on: 02/28/2017 08:28 AM   Modules accepted: Orders

## 2017-02-28 NOTE — Progress Notes (Signed)
Subjective:  Patient ID: [name] [name], male    DOB: 07-Dec-1950  Age: 66 y.o. MRN: [id number]  CC: No chief complaint on file.   HPI Mercy Malena presents for DM, HTN, obesity f/u. C/o chronic cough  Outpatient Medications Prior to Visit  Medication Sig Dispense Refill  . amLODipine (NORVASC) 5 MG tablet Take 1 tablet (5 mg total) by mouth daily. 90 tablet 3  . aspirin 81 MG tablet Take 81 mg by mouth daily.      . B-D ULTRAFINE III SHORT PEN 31G X 8 MM MISC USE AS DIRECTED 200 each 3  . cholecalciferol (VITAMIN D) 1000 units tablet Take 3,000 Units by mouth daily.    . cyclobenzaprine (FLEXERIL) 5 MG tablet Take 1 tablet (5 mg total) by mouth at bedtime. 90 tablet 3  . furosemide (LASIX) 40 MG tablet Take 1 tablet (40 mg total) by mouth daily. 90 tablet 3  . gabapentin (NEURONTIN) 600 MG tablet Take 1 tablet (600 mg total) by mouth 2 (two) times daily. 240 tablet 3  . Insulin Glargine (TOUJEO SOLOSTAR) 300 UNIT/ML SOPN Inject 150 Units into the skin every morning. Titrate up by 1 unit a day for goal sugars of 100-130 up to 40 units a day 32 pen 11  . JARDIANCE 10 MG TABS tablet TAKE 1 TABLET BY MOUTH EVERY DAY 30 tablet 11  . losartan (COZAAR) 100 MG tablet Take 1 tablet (100 mg total) by mouth daily. 90 tablet 3  . metoprolol succinate (TOPROL XL) 25 MG 24 hr tablet Take 1 tablet (25 mg total) by mouth daily. 90 tablet 3  . Naproxen-Esomeprazole 500-20 MG TBEC Take 1 tablet by mouth 2 (two) times daily as needed. 180 tablet 2  . ondansetron (ZOFRAN) 4 MG tablet Take 1 tablet (4 mg total) by mouth every 6 (six) hours as needed for nausea. 65 tablet 0  . PENNSAID 2 % SOLN PLACE ONE application onto SKIN TWICE DAILY AS NEEDED  2  . pravastatin (PRAVACHOL) 40 MG tablet Take 1 tablet (40 mg total) by mouth daily. 90 tablet 3  . rosuvastatin (CRESTOR) 20 MG tablet Take 1 tablet (20 mg total) by mouth daily. 90 tablet 3  . tamsulosin (FLOMAX) 0.4 MG CAPS capsule Take 1 capsule (0.4 mg  total) by mouth daily after supper. 90 capsule 3   No facility-administered medications prior to visit.     ROS Review of Systems  Constitutional: Negative for appetite change, fatigue and unexpected weight change.  HENT: Negative for congestion, nosebleeds, sneezing, sore throat and trouble swallowing.   Eyes: Negative for itching and visual disturbance.  Respiratory: Negative for cough.   Cardiovascular: Negative for chest pain, palpitations and leg swelling.  Gastrointestinal: Negative for abdominal distention, blood in stool, diarrhea and nausea.  Genitourinary: Negative for frequency and hematuria.  Musculoskeletal: Negative for back pain, gait problem, joint swelling and neck pain.  Skin: Negative for rash.  Neurological: Negative for dizziness, tremors, speech difficulty and weakness.  Psychiatric/Behavioral: Negative for agitation, dysphoric mood and sleep disturbance. The patient is not nervous/anxious.     Objective:  BP 140/82 (BP Location: Left Arm, Patient Position: Sitting, Cuff Size: Large)   Pulse 75   Temp 98 F (36.7 C) (Oral)   Ht 5\' 9"  (1.753 m)   Wt 278 lb (126.1 kg)   SpO2 98%   BMI 41.05 kg/m   BP Readings from Last 3 Encounters:  02/28/17 140/82  12/09/16 138/90  11/19/16 132/80  Wt Readings from Last 3 Encounters:  02/28/17 278 lb (126.1 kg)  12/09/16 279 lb (126.6 kg)  11/19/16 276 lb (125.2 kg)    Physical Exam  Constitutional: He is oriented to person, place, and time. He appears well-developed. No distress.  NAD  HENT:  Mouth/Throat: Oropharynx is clear and moist.  Eyes: Pupils are equal, round, and reactive to light. Conjunctivae are normal.  Neck: Normal range of motion. No JVD present. No thyromegaly present.  Cardiovascular: Normal rate, regular rhythm, normal heart sounds and intact distal pulses.  Exam reveals no gallop and no friction rub.   No murmur heard. Pulmonary/Chest: Effort normal and breath sounds normal. No  respiratory distress. He has no wheezes. He has no rales. He exhibits no tenderness.  Abdominal: Soft. Bowel sounds are normal. He exhibits no distension and no mass. There is no tenderness. There is no rebound and no guarding.  Musculoskeletal: Normal range of motion. He exhibits edema. He exhibits no tenderness.  Lymphadenopathy:    He has no cervical adenopathy.  Neurological: He is alert and oriented to person, place, and time. He has normal reflexes. No cranial nerve deficit. He exhibits normal muscle tone. He displays a negative Romberg sign. Coordination and gait normal.  Skin: Skin is warm and dry. No rash noted.  Psychiatric: He has a normal mood and affect. His behavior is normal. Judgment and thought content normal.  trace B edema Obese umbilical hernia  Lab Results  Component Value Date   WBC 8.8 02/17/2016   HGB 14.6 02/17/2016   HCT 43.4 02/17/2016   PLT 338.0 02/17/2016   GLUCOSE 126 (H) 11/19/2016   CHOL 285 (H) 02/17/2016   TRIG 309.0 (H) 02/17/2016   HDL 35.80 (L) 02/17/2016   LDLDIRECT 200.0 02/17/2016   LDLCALC 139 (H) 04/09/2015   ALT 23 02/17/2016   AST 15 02/17/2016   NA 139 11/19/2016   K 4.9 11/19/2016   CL 100 11/19/2016   CREATININE 1.32 11/19/2016   BUN 28 (H) 11/19/2016   CO2 30 11/19/2016   TSH 2.76 02/17/2016   PSA 0.80 02/17/2016   INR 1.08 02/02/2011   HGBA1C 6.1 12/09/2016   MICROALBUR 195.1 (H) 02/17/2016    No results found.  Assessment & Plan:   There are no diagnoses linked to this encounter. I am having Mr. Jurgens maintain his aspirin, ondansetron, rosuvastatin, cholecalciferol, Insulin Glargine, metoprolol succinate, amLODipine, furosemide, losartan, tamsulosin, pravastatin, B-D ULTRAFINE III SHORT PEN, gabapentin, cyclobenzaprine, PENNSAID, Naproxen-Esomeprazole, and JARDIANCE.  No orders of the defined types were placed in this encounter.    Follow-up: No Follow-up on file.  Walker Kehr, MD

## 2017-02-28 NOTE — Assessment & Plan Note (Signed)
Labs

## 2017-02-28 NOTE — Assessment & Plan Note (Signed)
Better w/compression socks

## 2017-02-28 NOTE — Assessment & Plan Note (Signed)
Toprol XL, ASA, Amlodipine, Losartan

## 2017-03-25 ENCOUNTER — Ambulatory Visit: Payer: Managed Care, Other (non HMO) | Admitting: Neurology

## 2017-03-25 ENCOUNTER — Encounter: Payer: Self-pay | Admitting: Neurology

## 2017-03-25 VITALS — BP 130/70 | HR 87 | Ht 69.0 in | Wt 282.0 lb

## 2017-03-25 DIAGNOSIS — M4802 Spinal stenosis, cervical region: Secondary | ICD-10-CM | POA: Diagnosis not present

## 2017-03-25 DIAGNOSIS — R202 Paresthesia of skin: Secondary | ICD-10-CM

## 2017-03-25 DIAGNOSIS — G5623 Lesion of ulnar nerve, bilateral upper limbs: Secondary | ICD-10-CM | POA: Diagnosis not present

## 2017-03-25 MED ORDER — TIZANIDINE HCL 2 MG PO CAPS: 2.0000 mg | ORAL_CAPSULE | Freq: Every evening | ORAL | 3 refills | Status: DC | PRN

## 2017-03-25 MED ORDER — GABAPENTIN 600 MG PO TABS: 600.0000 mg | ORAL_TABLET | Freq: Two times a day (BID) | ORAL | 3 refills | Status: DC

## 2017-03-25 NOTE — Patient Instructions (Signed)
1.  Start tizanidine 2mg  at bedtime 2.  Continue gabapentin 600mg  twice  Return to clinic in 4 months
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
## 2017-04-14 ENCOUNTER — Telehealth: Payer: Self-pay | Admitting: Neurology

## 2017-04-14 NOTE — Telephone Encounter (Signed)
Please advise. If ok I will send Rx to [facility] per patient's request.

## 2017-04-14 NOTE — Telephone Encounter (Signed)
Yes, that would be fine.  Start baclofen 5mg  at bedtime x 1 week, then increase to 10mg  at bedtime.

## 2017-04-14 NOTE — Telephone Encounter (Signed)
Patient has a question about medication please call he wants to know if the Baclofen 5mg  could take the place of the muscle relaxer he is on due to the cost of the ins and a question about the other medication she has him on

## 2017-04-15 ENCOUNTER — Other Ambulatory Visit: Payer: Self-pay | Admitting: *Deleted

## 2017-04-15 MED ORDER — BACLOFEN 10 MG PO TABS: ORAL_TABLET | ORAL | 5 refills | Status: DC

## 2017-04-15 NOTE — Telephone Encounter (Signed)
Rx sent 

## 2017-05-04 ENCOUNTER — Other Ambulatory Visit: Payer: Self-pay | Admitting: Internal Medicine

## 2017-05-20 ENCOUNTER — Other Ambulatory Visit: Payer: Self-pay | Admitting: Internal Medicine

## 2017-07-04 ENCOUNTER — Ambulatory Visit: Payer: Managed Care, Other (non HMO) | Admitting: Internal Medicine

## 2017-07-05 ENCOUNTER — Encounter: Payer: Self-pay | Admitting: Internal Medicine

## 2017-07-05 ENCOUNTER — Ambulatory Visit (INDEPENDENT_AMBULATORY_CARE_PROVIDER_SITE_OTHER): Payer: PPO | Admitting: Internal Medicine

## 2017-07-05 VITALS — BP 138/78 | HR 71 | Temp 98.4°F | Ht 69.0 in | Wt 280.0 lb

## 2017-07-05 DIAGNOSIS — I10 Essential (primary) hypertension: Secondary | ICD-10-CM

## 2017-07-05 DIAGNOSIS — Z0000 Encounter for general adult medical examination without abnormal findings: Secondary | ICD-10-CM

## 2017-07-05 DIAGNOSIS — Z794 Long term (current) use of insulin: Secondary | ICD-10-CM

## 2017-07-05 DIAGNOSIS — Z23 Encounter for immunization: Secondary | ICD-10-CM

## 2017-07-05 DIAGNOSIS — E1165 Type 2 diabetes mellitus with hyperglycemia: Secondary | ICD-10-CM | POA: Diagnosis not present

## 2017-07-05 DIAGNOSIS — G4733 Obstructive sleep apnea (adult) (pediatric): Secondary | ICD-10-CM

## 2017-07-05 LAB — POCT GLYCOSYLATED HEMOGLOBIN (HGB A1C): HEMOGLOBIN A1C: 8

## 2017-07-05 MED ORDER — LOSARTAN POTASSIUM 100 MG PO TABS: 100.0000 mg | ORAL_TABLET | Freq: Every day | ORAL | 3 refills | Status: DC

## 2017-07-05 MED ORDER — INSULIN GLARGINE 300 UNIT/ML ~~LOC~~ SOPN: 150.0000 [IU] | PEN_INJECTOR | Freq: Every morning | SUBCUTANEOUS | 11 refills | Status: DC

## 2017-07-05 MED ORDER — FUROSEMIDE 40 MG PO TABS: 40.0000 mg | ORAL_TABLET | Freq: Every day | ORAL | 3 refills | Status: DC

## 2017-07-05 MED ORDER — TAMSULOSIN HCL 0.4 MG PO CAPS: 0.4000 mg | ORAL_CAPSULE | Freq: Every day | ORAL | 3 refills | Status: DC

## 2017-07-05 MED ORDER — EMPAGLIFLOZIN 10 MG PO TABS: 10.0000 mg | ORAL_TABLET | Freq: Every day | ORAL | 3 refills | Status: DC

## 2017-07-05 MED ORDER — AMLODIPINE BESYLATE 5 MG PO TABS: 5.0000 mg | ORAL_TABLET | Freq: Every day | ORAL | 3 refills | Status: DC

## 2017-07-05 MED ORDER — PRAVASTATIN SODIUM 40 MG PO TABS: 40.0000 mg | ORAL_TABLET | Freq: Every day | ORAL | 3 refills | Status: DC

## 2017-07-05 MED ORDER — HYDROCOD POLST-CPM POLST ER 10-8 MG/5ML PO SUER: 5.0000 mL | Freq: Two times a day (BID) | ORAL | 0 refills | Status: DC | PRN

## 2017-07-05 MED ORDER — METOPROLOL SUCCINATE ER 25 MG PO TB24: 25.0000 mg | ORAL_TABLET | Freq: Every day | ORAL | 3 refills | Status: DC

## 2017-07-05 NOTE — Assessment & Plan Note (Signed)
Toprol XL, ASA, Amlodipine, Losartan

## 2017-07-05 NOTE — Assessment & Plan Note (Signed)

## 2017-07-05 NOTE — Progress Notes (Addendum)
Subjective:  Patient ID: [name] [name], male    DOB: Dec 26, 1950  Age: 67 y.o. MRN: [id number]  CC: No chief complaint on file.   HPI [name] [name] presents for DM, HTN, obesity f/u  Outpatient Medications Prior to Visit  Medication Sig Dispense Refill  . aspirin 81 MG tablet Take 81 mg by mouth daily.      . B-D ULTRAFINE III SHORT PEN 31G X 8 MM MISC USE AS DIRECTED 200 each 3  . baclofen (LIORESAL) 10 MG tablet Take 5 mg (1/2 tablet) by mouth at bedtime x 1 week then increase to 10 mg (1 tablet) by mouth at bedtime. 30 each 5  . chlorpheniramine-HYDROcodone (TUSSIONEX PENNKINETIC ER) 10-8 MG/5ML SUER Take 5 mLs by mouth every 12 (twelve) hours as needed for cough. 115 mL 0  . cholecalciferol (VITAMIN D) 1000 units tablet Take 3,000 Units by mouth daily.    . furosemide (LASIX) 40 MG tablet TAKE 1 TABLET BY MOUTH EVERY DAY 90 tablet 3  . gabapentin (NEURONTIN) 600 MG tablet Take 1 tablet (600 mg total) 2 (two) times daily by mouth. 180 tablet 3  . Insulin Glargine (TOUJEO SOLOSTAR) 300 UNIT/ML SOPN Inject 150 Units into the skin every morning. Titrate up by 1 unit a day for goal sugars of 100-130 up to 40 units a day 32 pen 11  . JARDIANCE 10 MG TABS tablet TAKE 1 TABLET BY MOUTH EVERY DAY 30 tablet 11  . losartan (COZAAR) 100 MG tablet Take 1 tablet (100 mg total) by mouth daily. 90 tablet 3  . ondansetron (ZOFRAN) 4 MG tablet Take 1 tablet (4 mg total) by mouth every 6 (six) hours as needed for nausea. 65 tablet 0  . pravastatin (PRAVACHOL) 40 MG tablet TAKE 1 TABLET BY MOUTH EVERY DAY 90 tablet 1  . tamsulosin (FLOMAX) 0.4 MG CAPS capsule Take 1 capsule (0.4 mg total) by mouth daily after supper. 90 capsule 3  . amLODipine (NORVASC) 5 MG tablet Take 1 tablet (5 mg total) by mouth daily. 90 tablet 3  . metoprolol succinate (TOPROL XL) 25 MG 24 hr tablet Take 1 tablet (25 mg total) by mouth daily. 90 tablet 3  . Naproxen-Esomeprazole 500-20 MG TBEC Take 1 tablet by mouth 2 (two)  times daily as needed. 180 tablet 2  . PENNSAID 2 % SOLN PLACE ONE application onto SKIN TWICE DAILY AS NEEDED  2  . tizanidine (ZANAFLEX) 2 MG capsule Take 1 capsule (2 mg total) at bedtime as needed by mouth for muscle spasms. 90 capsule 3   No facility-administered medications prior to visit.     ROS Review of Systems  Constitutional: Negative for appetite change, fatigue and unexpected weight change.  HENT: Negative for congestion, nosebleeds, sneezing, sore throat and trouble swallowing.   Eyes: Negative for itching and visual disturbance.  Respiratory: Negative for cough.   Cardiovascular: Negative for chest pain, palpitations and leg swelling.  Gastrointestinal: Negative for abdominal distention, blood in stool, diarrhea and nausea.  Genitourinary: Negative for frequency and hematuria.  Musculoskeletal: Negative for back pain, gait problem, joint swelling and neck pain.  Skin: Negative for rash.  Neurological: Negative for dizziness, tremors, speech difficulty and weakness.  Psychiatric/Behavioral: Negative for agitation, dysphoric mood and sleep disturbance. The patient is not nervous/anxious.     Objective:  BP 138/78 (BP Location: Left Arm, Patient Position: Sitting, Cuff Size: Large)   Pulse 71   Temp 98.4 F (36.9 C) (Oral)   Ht 5\' 9"  (  1.753 m)   Wt 280 lb (127 kg)   SpO2 98%   BMI 41.35 kg/m   BP Readings from Last 3 Encounters:  07/05/17 138/78  03/25/17 130/70  02/28/17 140/82    Wt Readings from Last 3 Encounters:  07/05/17 280 lb (127 kg)  03/25/17 282 lb (127.9 kg)  02/28/17 278 lb (126.1 kg)    Physical Exam  Constitutional: He is oriented to person, place, and time. He appears well-developed. No distress.  NAD  HENT:  Mouth/Throat: Oropharynx is clear and moist.  Eyes: Conjunctivae are normal. Pupils are equal, round, and reactive to light.  Neck: Normal range of motion. No JVD present. No thyromegaly present.  Cardiovascular: Normal rate,  regular rhythm, normal heart sounds and intact distal pulses. Exam reveals no gallop and no friction rub.  No murmur heard. Pulmonary/Chest: Effort normal and breath sounds normal. No respiratory distress. He has no wheezes. He has no rales. He exhibits no tenderness.  Abdominal: Soft. Bowel sounds are normal. He exhibits no distension and no mass. There is no tenderness. There is no rebound and no guarding.  Musculoskeletal: Normal range of motion. He exhibits no edema or tenderness.  Lymphadenopathy:    He has no cervical adenopathy.  Neurological: He is alert and oriented to person, place, and time. He has normal reflexes. No cranial nerve deficit. He exhibits normal muscle tone. He displays a negative Romberg sign. Coordination and gait normal.  Skin: Skin is warm and dry. No rash noted.  Psychiatric: He has a normal mood and affect. His behavior is normal. Judgment and thought content normal.  obese  Lab Results  Component Value Date   WBC 8.8 02/17/2016   HGB 14.6 02/17/2016   HCT 43.4 02/17/2016   PLT 338.0 02/17/2016   GLUCOSE 188 (H) 02/28/2017   CHOL 285 (H) 02/17/2016   TRIG 309.0 (H) 02/17/2016   HDL 35.80 (L) 02/17/2016   LDLDIRECT 200.0 02/17/2016   LDLCALC 139 (H) 04/09/2015   ALT 23 02/17/2016   AST 15 02/17/2016   NA 136 02/28/2017   K 4.1 02/28/2017   CL 100 02/28/2017   CREATININE 1.32 02/28/2017   BUN 34 (H) 02/28/2017   CO2 27 02/28/2017   TSH 2.76 02/17/2016   PSA 0.80 02/17/2016   INR 1.08 02/02/2011   HGBA1C 8.2 (H) 02/28/2017   MICROALBUR 195.1 (H) 02/17/2016    No results found.  Assessment & Plan:   Diagnoses and all orders for this visit:  Essential hypertension -     metoprolol succinate (TOPROL XL) 25 MG 24 hr tablet; Take 1 tablet (25 mg total) by mouth daily.  Other orders -     amLODipine (NORVASC) 5 MG tablet; Take 1 tablet (5 mg total) by mouth daily. -     empagliflozin (JARDIANCE) 10 MG TABS tablet; Take 10 mg by mouth daily. -      furosemide (LASIX) 40 MG tablet; Take 1 tablet (40 mg total) by mouth daily. -     chlorpheniramine-HYDROcodone (TUSSIONEX PENNKINETIC ER) 10-8 MG/5ML SUER; Take 5 mLs by mouth every 12 (twelve) hours as needed for cough. -     Insulin Glargine (TOUJEO SOLOSTAR) 300 UNIT/ML SOPN; Inject 150 Units into the skin every morning. Titrate up by 1 unit a day for goal sugars of 100-130 up to 40 units a day -     losartan (COZAAR) 100 MG tablet; Take 1 tablet (100 mg total) by mouth daily. -     pravastatin (  PRAVACHOL) 40 MG tablet; Take 1 tablet (40 mg total) by mouth daily. -     tamsulosin (FLOMAX) 0.4 MG CAPS capsule; Take 1 capsule (0.4 mg total) by mouth daily after supper.   I have discontinued Aidric Mcgilvery's PENNSAID, Naproxen-Esomeprazole, and tizanidine. I am also having him maintain his aspirin, ondansetron, cholecalciferol, Insulin Glargine, metoprolol succinate, amLODipine, losartan, tamsulosin, B-D ULTRAFINE III SHORT PEN, JARDIANCE, chlorpheniramine-HYDROcodone, gabapentin, baclofen, furosemide, and pravastatin.  No orders of the defined types were placed in this encounter.    Follow-up: No Follow-up on file.  Walker Kehr, MD

## 2017-07-05 NOTE — Assessment & Plan Note (Signed)
ref to Dr Beasley 

## 2017-07-05 NOTE — Assessment & Plan Note (Signed)
Dr Elsworth Soho On CPAP

## 2017-07-05 NOTE — Addendum Note (Signed)
Addended by: Karren Cobble on: 07/05/2017 04:31 PM   Modules accepted: Orders

## 2017-07-05 NOTE — Patient Instructions (Signed)
A1c: 8.0

## 2017-07-05 NOTE — Assessment & Plan Note (Signed)
[place]je[place] 

## 2017-07-05 NOTE — Addendum Note (Signed)
Addended by: Cassandria Anger on: 07/05/2017 02:30 PM   Modules accepted: Orders

## 2017-07-06 ENCOUNTER — Other Ambulatory Visit: Payer: Self-pay | Admitting: Internal Medicine

## 2017-07-06 DIAGNOSIS — I10 Essential (primary) hypertension: Secondary | ICD-10-CM

## 2017-07-15 ENCOUNTER — Telehealth: Payer: Self-pay | Admitting: Pulmonary Disease

## 2017-07-15 NOTE — Telephone Encounter (Signed)
SN ok to switch this pt to see RA for OSA?

## 2017-07-15 NOTE — Telephone Encounter (Signed)
Per Dr. Lenna Gilford,  It is okay for pt to switch to Dr. Elsworth Soho for OSA.  Routing this message to [facility] for her to schedule pt for an appt.

## 2017-07-19 NOTE — Telephone Encounter (Signed)
Spoke with pt. He has been scheduled to see RA on 09/14/17 at 9:15am. Nothing further was needed.

## 2017-07-20 ENCOUNTER — Ambulatory Visit: Payer: Managed Care, Other (non HMO) | Admitting: Neurology

## 2017-07-29 ENCOUNTER — Other Ambulatory Visit (INDEPENDENT_AMBULATORY_CARE_PROVIDER_SITE_OTHER): Payer: PPO

## 2017-07-29 DIAGNOSIS — Z794 Long term (current) use of insulin: Secondary | ICD-10-CM

## 2017-07-29 DIAGNOSIS — Z0000 Encounter for general adult medical examination without abnormal findings: Secondary | ICD-10-CM | POA: Diagnosis not present

## 2017-07-29 DIAGNOSIS — E1159 Type 2 diabetes mellitus with other circulatory complications: Secondary | ICD-10-CM

## 2017-07-29 DIAGNOSIS — E1165 Type 2 diabetes mellitus with hyperglycemia: Secondary | ICD-10-CM

## 2017-07-29 DIAGNOSIS — I10 Essential (primary) hypertension: Secondary | ICD-10-CM

## 2017-07-29 DIAGNOSIS — IMO0002 Reserved for concepts with insufficient information to code with codable children: Secondary | ICD-10-CM

## 2017-07-29 LAB — CBC WITH DIFFERENTIAL/PLATELET
BASOS PCT: 0.6 % (ref 0.0–3.0)
Basophils Absolute: 0 10*3/uL (ref 0.0–0.1)
Eosinophils Absolute: 0.3 10*3/uL (ref 0.0–0.7)
Eosinophils Relative: 4.3 % (ref 0.0–5.0)
HEMATOCRIT: 45.2 % (ref 39.0–52.0)
HEMOGLOBIN: 15.2 g/dL (ref 13.0–17.0)
LYMPHS PCT: 21 % (ref 12.0–46.0)
Lymphs Abs: 1.4 10*3/uL (ref 0.7–4.0)
MCHC: 33.6 g/dL (ref 30.0–36.0)
MCV: 79.1 fl (ref 78.0–100.0)
Monocytes Absolute: 0.5 10*3/uL (ref 0.1–1.0)
Monocytes Relative: 6.6 % (ref 3.0–12.0)
Neutro Abs: 4.6 10*3/uL (ref 1.4–7.7)
Neutrophils Relative %: 67.5 % (ref 43.0–77.0)
Platelets: 286 10*3/uL (ref 150.0–400.0)
RBC: 5.72 Mil/uL (ref 4.22–5.81)
RDW: 15.4 % (ref 11.5–15.5)
WBC: 6.9 10*3/uL (ref 4.0–10.5)

## 2017-07-29 LAB — BASIC METABOLIC PANEL
BUN: 35 mg/dL — ABNORMAL HIGH (ref 6–23)
CALCIUM: 9.4 mg/dL (ref 8.4–10.5)
CO2: 29 meq/L (ref 19–32)
CREATININE: 1.33 mg/dL (ref 0.40–1.50)
Chloride: 101 mEq/L (ref 96–112)
GFR: 57.01 mL/min — ABNORMAL LOW (ref >60.00)
Glucose, Bld: 220 mg/dL — ABNORMAL HIGH (ref 70–99)
Potassium: 4.8 mEq/L (ref 3.5–5.1)
Sodium: 139 mEq/L (ref 135–145)

## 2017-07-29 LAB — LIPID PANEL
CHOLESTEROL: 252 mg/dL — AB (ref 0–200)
HDL: 48.8 mg/dL (ref >39.00)
LDL CALC: 175 mg/dL — AB (ref 0–99)
NONHDL: 202.74
Total CHOL/HDL Ratio: 5
Triglycerides: 140 mg/dL (ref 0.0–149.0)
VLDL: 28 mg/dL (ref 0.0–40.0)

## 2017-07-29 LAB — PSA: PSA: 0.78 ng/mL (ref 0.10–4.00)

## 2017-07-29 LAB — HEPATIC FUNCTION PANEL
ALT: 14 U/L (ref 0–53)
AST: 13 U/L (ref 0–37)
Albumin: 3.6 g/dL (ref 3.5–5.2)
Alkaline Phosphatase: 87 U/L (ref 39–117)
BILIRUBIN DIRECT: 0.1 mg/dL (ref 0.0–0.3)
TOTAL PROTEIN: 6.6 g/dL (ref 6.0–8.3)
Total Bilirubin: 0.3 mg/dL (ref 0.2–1.2)

## 2017-07-29 LAB — TSH: TSH: 4.14 u[IU]/mL (ref 0.35–4.50)

## 2017-07-29 LAB — HEMOGLOBIN A1C: Hgb A1c MFr Bld: 7.9 % — ABNORMAL HIGH (ref 4.6–6.5)

## 2017-09-14 ENCOUNTER — Institutional Professional Consult (permissible substitution): Payer: PPO | Admitting: Pulmonary Disease

## 2017-09-28 ENCOUNTER — Other Ambulatory Visit (INDEPENDENT_AMBULATORY_CARE_PROVIDER_SITE_OTHER): Payer: PPO

## 2017-09-28 ENCOUNTER — Ambulatory Visit (INDEPENDENT_AMBULATORY_CARE_PROVIDER_SITE_OTHER): Payer: PPO | Admitting: Internal Medicine

## 2017-09-28 ENCOUNTER — Encounter: Payer: Self-pay | Admitting: Internal Medicine

## 2017-09-28 DIAGNOSIS — Z794 Long term (current) use of insulin: Secondary | ICD-10-CM

## 2017-09-28 DIAGNOSIS — E785 Hyperlipidemia, unspecified: Secondary | ICD-10-CM

## 2017-09-28 DIAGNOSIS — E1165 Type 2 diabetes mellitus with hyperglycemia: Secondary | ICD-10-CM

## 2017-09-28 LAB — BASIC METABOLIC PANEL
BUN: 24 mg/dL — ABNORMAL HIGH (ref 6–23)
CALCIUM: 9.1 mg/dL (ref 8.4–10.5)
CHLORIDE: 101 meq/L (ref 96–112)
CO2: 31 mEq/L (ref 19–32)
Creatinine, Ser: 1.26 mg/dL (ref 0.40–1.50)
GFR: 60.65 mL/min (ref >60.00)
GLUCOSE: 245 mg/dL — AB (ref 70–99)
POTASSIUM: 4.5 meq/L (ref 3.5–5.1)
Sodium: 139 mEq/L (ref 135–145)

## 2017-09-28 LAB — HEMOGLOBIN A1C: Hgb A1c MFr Bld: 7.2 % — ABNORMAL HIGH (ref 4.6–6.5)

## 2017-09-28 MED ORDER — SCOPOLAMINE 1 MG/3DAYS TD PT72: 1.0000 | MEDICATED_PATCH | TRANSDERMAL | 1 refills | Status: DC

## 2017-09-28 NOTE — Progress Notes (Signed)
Subjective:  Patient ID: [name] [name], male    DOB: 09-08-1950  Age: 67 y.o. MRN: [id number]  CC: No chief complaint on file.   HPI [name] [name] presents for HTN, DM, obesity f/u C/o drug cost. Going to [place]    Outpatient Medications Prior to Visit  Medication Sig Dispense Refill  . amLODipine (NORVASC) 5 MG tablet Take 1 tablet (5 mg total) by mouth daily. 90 tablet 3  . aspirin 81 MG tablet Take 81 mg by mouth daily.      . B-D ULTRAFINE III SHORT PEN 31G X 8 MM MISC USE AS DIRECTED 200 each 3  . baclofen (LIORESAL) 10 MG tablet Take 5 mg (1/2 tablet) by mouth at bedtime x 1 week then increase to 10 mg (1 tablet) by mouth at bedtime. 30 each 5  . chlorpheniramine-HYDROcodone (TUSSIONEX PENNKINETIC ER) 10-8 MG/5ML SUER Take 5 mLs by mouth every 12 (twelve) hours as needed for cough. 115 mL 0  . cholecalciferol (VITAMIN D) 1000 units tablet Take 3,000 Units by mouth daily.    . empagliflozin (JARDIANCE) 10 MG TABS tablet Take 10 mg by mouth daily. 90 tablet 3  . furosemide (LASIX) 40 MG tablet Take 1 tablet (40 mg total) by mouth daily. 90 tablet 3  . gabapentin (NEURONTIN) 600 MG tablet Take 1 tablet (600 mg total) 2 (two) times daily by mouth. 180 tablet 3  . losartan (COZAAR) 100 MG tablet TAKE 1 TABLET BY MOUTH EVERY DAY 90 tablet 1  . metoprolol succinate (TOPROL-XL) 25 MG 24 hr tablet TAKE 1 TABLET BY MOUTH EVERY DAY 90 tablet 1  . ondansetron (ZOFRAN) 4 MG tablet Take 1 tablet (4 mg total) by mouth every 6 (six) hours as needed for nausea. 65 tablet 0  . pravastatin (PRAVACHOL) 40 MG tablet Take 1 tablet (40 mg total) by mouth daily. 90 tablet 3  . tamsulosin (FLOMAX) 0.4 MG CAPS capsule Take 1 capsule (0.4 mg total) by mouth daily after supper. 90 capsule 3  . TOUJEO SOLOSTAR 300 UNIT/ML SOPN INJECT 150 UNITS INTO THE SKIN EVERY MORNING. TITRATE UP BY 1 UNIT A DAY FOR GOAL SUGARS 100-130 31.5 pen 5   No facility-administered medications prior to visit.      ROS Review of Systems  Constitutional: Negative for appetite change, fatigue and unexpected weight change.  HENT: Negative for congestion, nosebleeds, sneezing, sore throat and trouble swallowing.   Eyes: Negative for itching and visual disturbance.  Respiratory: Negative for cough.   Cardiovascular: Negative for chest pain, palpitations and leg swelling.  Gastrointestinal: Negative for abdominal distention, blood in stool, diarrhea and nausea.  Genitourinary: Negative for frequency and hematuria.  Musculoskeletal: Positive for back pain. Negative for gait problem, joint swelling and neck pain.  Skin: Negative for rash.  Neurological: Negative for dizziness, tremors, speech difficulty and weakness.  Psychiatric/Behavioral: Negative for agitation, dysphoric mood and sleep disturbance. The patient is not nervous/anxious.     Objective:  BP 136/78 (BP Location: Left Arm, Patient Position: Sitting, Cuff Size: Large)   Pulse 88   Temp 98.1 F (36.7 C) (Oral)   Ht 5\' 9"  (1.753 m)   Wt 280 lb (127 kg)   SpO2 98%   BMI 41.35 kg/m   BP Readings from Last 3 Encounters:  09/28/17 136/78  07/05/17 138/78  03/25/17 130/70    Wt Readings from Last 3 Encounters:  09/28/17 280 lb (127 kg)  07/05/17 280 lb (127 kg)  03/25/17 282 lb (127.9 kg)  Physical Exam  Constitutional: He is oriented to person, place, and time. He appears well-developed. No distress.  NAD  HENT:  Mouth/Throat: Oropharynx is clear and moist.  Eyes: Pupils are equal, round, and reactive to light. Conjunctivae are normal.  Neck: Normal range of motion. No JVD present. No thyromegaly present.  Cardiovascular: Normal rate, regular rhythm, normal heart sounds and intact distal pulses. Exam reveals no gallop and no friction rub.  No murmur heard. Pulmonary/Chest: Effort normal and breath sounds normal. No respiratory distress. He has no wheezes. He has no rales. He exhibits no tenderness.  Abdominal: Soft. Bowel  sounds are normal. He exhibits no distension and no mass. There is no tenderness. There is no rebound and no guarding.  Musculoskeletal: Normal range of motion. He exhibits tenderness. He exhibits no edema.  Lymphadenopathy:    He has no cervical adenopathy.  Neurological: He is alert and oriented to person, place, and time. He has normal reflexes. No cranial nerve deficit. He exhibits normal muscle tone. He displays a negative Romberg sign. Coordination and gait normal.  Skin: Skin is warm and dry. No rash noted.  Psychiatric: He has a normal mood and affect. His behavior is normal. Judgment and thought content normal.   Obese  Lab Results  Component Value Date   WBC 6.9 07/29/2017   HGB 15.2 07/29/2017   HCT 45.2 07/29/2017   PLT 286.0 07/29/2017   GLUCOSE 220 (H) 07/29/2017   CHOL 252 (H) 07/29/2017   TRIG 140.0 07/29/2017   HDL 48.80 07/29/2017   LDLDIRECT 200.0 02/17/2016   LDLCALC 175 (H) 07/29/2017   ALT 14 07/29/2017   AST 13 07/29/2017   NA 139 07/29/2017   K 4.8 07/29/2017   CL 101 07/29/2017   CREATININE 1.33 07/29/2017   BUN 35 (H) 07/29/2017   CO2 29 07/29/2017   TSH 4.14 07/29/2017   PSA 0.78 07/29/2017   INR 1.08 02/02/2011   HGBA1C 7.9 (H) 07/29/2017   MICROALBUR 195.1 (H) 02/17/2016    No results found.  Assessment & Plan:   There are no diagnoses linked to this encounter. I am having [name] [name] maintain his aspirin, ondansetron, cholecalciferol, B-D ULTRAFINE III SHORT PEN, gabapentin, baclofen, amLODipine, empagliflozin, furosemide, chlorpheniramine-HYDROcodone, pravastatin, tamsulosin, metoprolol succinate, TOUJEO SOLOSTAR, and losartan.  No orders of the defined types were placed in this encounter.    Follow-up: No follow-ups on file.  Walker Kehr, MD

## 2017-09-28 NOTE — Assessment & Plan Note (Signed)
Endocr consult Labs

## 2017-09-28 NOTE — Assessment & Plan Note (Signed)
Pravastatin  

## 2017-09-28 NOTE — Assessment & Plan Note (Signed)
ref to Dr Beasley 

## 2017-10-04 DIAGNOSIS — Z85820 Personal history of malignant melanoma of skin: Secondary | ICD-10-CM | POA: Diagnosis not present

## 2017-10-04 DIAGNOSIS — C4442 Squamous cell carcinoma of skin of scalp and neck: Secondary | ICD-10-CM | POA: Diagnosis not present

## 2017-10-04 DIAGNOSIS — D485 Neoplasm of uncertain behavior of skin: Secondary | ICD-10-CM | POA: Diagnosis not present

## 2017-10-04 DIAGNOSIS — D1801 Hemangioma of skin and subcutaneous tissue: Secondary | ICD-10-CM | POA: Diagnosis not present

## 2017-10-04 DIAGNOSIS — B079 Viral wart, unspecified: Secondary | ICD-10-CM | POA: Diagnosis not present

## 2017-10-04 DIAGNOSIS — L814 Other melanin hyperpigmentation: Secondary | ICD-10-CM | POA: Diagnosis not present

## 2017-10-04 DIAGNOSIS — D225 Melanocytic nevi of trunk: Secondary | ICD-10-CM | POA: Diagnosis not present

## 2017-10-04 DIAGNOSIS — L821 Other seborrheic keratosis: Secondary | ICD-10-CM | POA: Diagnosis not present

## 2017-10-04 DIAGNOSIS — L570 Actinic keratosis: Secondary | ICD-10-CM | POA: Diagnosis not present

## 2017-10-05 ENCOUNTER — Ambulatory Visit: Payer: PPO | Admitting: Internal Medicine

## 2017-10-24 ENCOUNTER — Ambulatory Visit: Payer: PPO | Admitting: Pulmonary Disease

## 2017-10-24 ENCOUNTER — Encounter: Payer: Self-pay | Admitting: Pulmonary Disease

## 2017-10-24 DIAGNOSIS — G4733 Obstructive sleep apnea (adult) (pediatric): Secondary | ICD-10-CM | POA: Diagnosis not present

## 2017-10-24 NOTE — Progress Notes (Signed)
Subjective:    Patient ID: [name] [name], male    DOB: 16-Apr-1951, 67 y.o.   MRN: [id number]  HPI  Chief Complaint  Patient presents with  . Sleep Consult    Former SN for OSA. Uses [facility] as his DME. Per patient, he has started to snore while using machine. Does not use a full face mask due to discomfort.       67 year old diabetic, hypertensive presents to establish care for obstructive sleep apnea. 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  he was initially diagnosed around 1995 with severe OSA and has been on CPAP since then. His last machine was obtained around 2017, prior auto CPAP download had shown average pressure needs of 13 cm.  He was placed on auto CPAP 10 to 18 cm. CPAP has really helped him a lot over the years with improvement in his daytime somnolence and fatigue.  His main complaint is that he has a leak from his mouth which his wife complains about.  He has tried a full facemask in the past but has been unable to tolerate due to claustrophobia and due to leak from around the sides.  He would really like to stop the Librium and graduate to full facemask.  Epworth sleepiness score is 3 and he denies sleep pressure in the afternoons or during driving Bedtime is around 9 PM, sleep latency is minimal, he sleeps on his back with one pillow, he has a Tempur-Pedic bed , reports 2-3 nocturnal awakenings including nocturia and is out of bed by 5 AM feeling rested without headaches and with occasional dryness of mouth.  His current weight is 278 pounds There is no history suggestive of cataplexy, sleep paralysis or parasomnias CPAP download was reviewed which shows average pressure of 12 cm with good control of events with minimal leak on auto settings 10 to 18 cm with excellent compliance about 8 hours every night    Significant tests/ events reviewed  1995 NPSG (wt=235#) w/ 53 events/hr & desat to 83%- started on CPAP... 2011  (wt=290#) >>new CPAP machine, last  autotitrate showed optimal pressure= 13cmH2O.    Past Medical History:  Diagnosis Date  . Bursitis of left shoulder   . Cervical disc disease   . Diabetes mellitus, type 2 ([place])   . Diverticulosis of colon   . Erectile dysfunction   . Exogenous obesity   . History of anemia   . Hyperlipidemia   . Hypertension   . OSA (obstructive sleep apnea)    cpap  . Testicular hypofunction      Past Surgical History:  Procedure Laterality Date  . POSTERIOR LAMINECTOMY / DECOMPRESSION CERVICAL SPINE     c5-6 fusion 12/08 Dr. Consuello Masse  . s/p cervical disc surgery and fusion  10/09   Dr. Consuello Masse     Allergies  Allergen Reactions  . Amoxicillin Nausea And Vomiting  . Lisinopril     Cough   . Penicillins     Per pt: unknown  . Simvastatin     myalgia    Social History   Socioeconomic History  . Marital status: Married    Spouse name: Not on file  . Number of children: 1  . Years of education: Not on file  . Highest education level: Not on file  Occupational History  . Occupation: Publishing rights manager for [employer]  . Occupation: SUPERVISOR    Employer: [employer] house [employer]  . Occupation: retired 2018  Social Needs  . Financial  resource strain: Not on file  . Food insecurity:    Worry: Not on file    Inability: Not on file  . Transportation needs:    Medical: Not on file    Non-medical: Not on file  Tobacco Use  . Smoking status: Former Smoker    Last attempt to quit: 01/20/1981    Years since quitting: 36.7  . Smokeless tobacco: Never Used  Substance and Sexual Activity  . Alcohol use: Yes    Alcohol/week: 4.2 oz    Types: 4 Glasses of wine, 3 Standard drinks or equivalent per week    Comment: He drinks 3-4 glasses of wine several times per week.  . Drug use: No  . Sexual activity: Yes  Lifestyle  . Physical activity:    Days per week: Not on file    Minutes per session: Not on file  . Stress: Not on file  Relationships  . Social connections:    Talks on  phone: Not on file    Gets together: Not on file    Attends religious service: Not on file    Active member of club or organization: Not on file    Attends meetings of clubs or organizations: Not on file    Relationship status: Not on file  . Intimate partner violence:    Fear of current or ex partner: Not on file    Emotionally abused: Not on file    Physically abused: Not on file    Forced sexual activity: Not on file  Other Topics Concern  . Not on file  Social History Narrative   Lives with wife and son in a 2 story home.  Has 1 son.     Retired.     Education: high school.     Family History  Problem Relation Age of Onset  . Heart disease Father        Died of MI at age 59  . Breast cancer Mother   . Diabetes Paternal Grandmother   . Healthy Son   . Colon cancer Neg Hx     Review of Systems Positive for shortness of breath with activity, feet swelling, wears compression hose, joint stiffness  Constitutional: negative for anorexia, fevers and sweats  Eyes: negative for irritation, redness and visual disturbance  Ears, nose, mouth, throat, and face: negative for earaches, epistaxis, nasal congestion and sore throat  Respiratory: negative for cough,  sputum and wheezing  Cardiovascular: negative for chest pain,  lower extremity edema, orthopnea, palpitations and syncope  Gastrointestinal: negative for abdominal pain, constipation, diarrhea, melena, nausea and vomiting  Genitourinary:negative for dysuria, frequency and hematuria  Hematologic/lymphatic: negative for bleeding, easy bruising and lymphadenopathy  Musculoskeletal:negative for arthralgias, muscle weakness and stiff joints  Neurological: negative for coordination problems, gait problems, headaches and weakness  Endocrine: negative for diabetic symptoms including polydipsia, polyuria and weight loss     Objective:   Physical Exam  Gen. Pleasant, obese, in no distress, normal affect ENT - no lesions, no post  nasal drip, class 2 airway Neck: No JVD, no thyromegaly, no carotid bruits Lungs: no use of accessory muscles, no dullness to percussion, decreased without rales or rhonchi  Cardiovascular: Rhythm regular, heart sounds  normal, no murmurs or gallops, no peripheral edema Abdomen: soft and non-tender, no hepatosplenomegaly, BS normal. Musculoskeletal: No deformities, no cyanosis or clubbing Neuro:  alert, non focal, no tremors       Assessment & Plan:

## 2017-10-24 NOTE — Addendum Note (Signed)
Addended by: Valerie Salts on: 10/24/2017 03:24 PM   Modules accepted: Orders

## 2017-10-24 NOTE — Assessment & Plan Note (Signed)
Auto CPAP settings are working well, average pressure of 12 cm.  He has good control of symptoms with minimal leak and his daytime somnolence and fatigue is controlled.  He would however still like to fix the mouth leak if possible so that his wife can sleep without disturbance  We discussed options -Trial of air fit F 30 fullface mask we will send prescription to [facility] -  -If this does not work, trial of chinstrap - - Trial of head elevation on your bed  Weight loss encouraged, compliance with goal of at least 4-6 hrs every night is the expectation. Advised against medications with sedative side effects Cautioned against driving when sleepy - understanding that sleepiness will vary on a day to day basis

## 2017-10-24 NOTE — Patient Instructions (Signed)
Auto CPAP settings are working well, average pressure of 12 cm.  We discussed options -Trial of air fit F 30 fullface mask we will send prescription to [place] -  -If this does not work, trial of chinstrap - - Trial of head elevation on your bed

## 2017-10-25 DIAGNOSIS — H93233 Hyperacusis, bilateral: Secondary | ICD-10-CM | POA: Diagnosis not present

## 2017-10-25 DIAGNOSIS — H524 Presbyopia: Secondary | ICD-10-CM | POA: Diagnosis not present

## 2017-10-25 DIAGNOSIS — E119 Type 2 diabetes mellitus without complications: Secondary | ICD-10-CM | POA: Diagnosis not present

## 2017-10-25 DIAGNOSIS — H04123 Dry eye syndrome of bilateral lacrimal glands: Secondary | ICD-10-CM | POA: Diagnosis not present

## 2017-11-02 DIAGNOSIS — G4733 Obstructive sleep apnea (adult) (pediatric): Secondary | ICD-10-CM | POA: Diagnosis not present

## 2017-11-11 DIAGNOSIS — G4733 Obstructive sleep apnea (adult) (pediatric): Secondary | ICD-10-CM | POA: Diagnosis not present

## 2017-11-15 DIAGNOSIS — G4733 Obstructive sleep apnea (adult) (pediatric): Secondary | ICD-10-CM | POA: Diagnosis not present

## 2017-11-16 DIAGNOSIS — L905 Scar conditions and fibrosis of skin: Secondary | ICD-10-CM | POA: Diagnosis not present

## 2017-11-16 DIAGNOSIS — L570 Actinic keratosis: Secondary | ICD-10-CM | POA: Diagnosis not present

## 2017-11-16 DIAGNOSIS — Z85828 Personal history of other malignant neoplasm of skin: Secondary | ICD-10-CM | POA: Diagnosis not present

## 2017-12-28 ENCOUNTER — Encounter: Payer: Self-pay | Admitting: Neurology

## 2017-12-28 ENCOUNTER — Ambulatory Visit (INDEPENDENT_AMBULATORY_CARE_PROVIDER_SITE_OTHER): Payer: PPO | Admitting: Neurology

## 2017-12-28 VITALS — BP 162/70 | HR 76 | Ht 70.0 in | Wt 286.0 lb

## 2017-12-28 DIAGNOSIS — M4802 Spinal stenosis, cervical region: Secondary | ICD-10-CM

## 2017-12-28 DIAGNOSIS — H8110 Benign paroxysmal vertigo, unspecified ear: Secondary | ICD-10-CM | POA: Diagnosis not present

## 2017-12-28 DIAGNOSIS — R202 Paresthesia of skin: Secondary | ICD-10-CM

## 2017-12-28 DIAGNOSIS — R209 Unspecified disturbances of skin sensation: Secondary | ICD-10-CM | POA: Diagnosis not present

## 2017-12-28 DIAGNOSIS — G5603 Carpal tunnel syndrome, bilateral upper limbs: Secondary | ICD-10-CM

## 2017-12-28 DIAGNOSIS — M5412 Radiculopathy, cervical region: Secondary | ICD-10-CM | POA: Diagnosis not present

## 2017-12-28 DIAGNOSIS — G5623 Lesion of ulnar nerve, bilateral upper limbs: Secondary | ICD-10-CM | POA: Diagnosis not present

## 2017-12-28 MED ORDER — GABAPENTIN 600 MG PO TABS: 600.0000 mg | ORAL_TABLET | Freq: Two times a day (BID) | ORAL | 3 refills | Status: DC

## 2017-12-28 MED ORDER — BACLOFEN 10 MG PO TABS: 10.0000 mg | ORAL_TABLET | Freq: Every day | ORAL | 3 refills | Status: DC

## 2017-12-28 NOTE — Patient Instructions (Addendum)
Start over the counter meclizine 12.5mg  twice daily as needed  If your facial numbness or dizziness gets worse, call my office  Continue your medications as you are taking.    Return to clinic 1 year

## 2017-12-28 NOTE — Progress Notes (Signed)
Follow-up Visit   Date: 12/28/17    [name] [name] MRN: [id number] DOB: 1950/10/16   Interim History: [name] [name] is a 67 y.o. right-handed Caucasian male with hypertension, hyperlipidemia, insulin-dependent diabetes mellitus type 2, OSA, s/p cervical decompression and fusion at C5-6 returning to the clinic for follow-up of arm paresthesias and new complaints of right facial numbness and dizziness.  The patient was accompanied to the clinic by self.  History of present illness: End of January 2017, he began noticing right hip pain and was given prednisone which helped.  He travelled to [place] and when he awoke in the morning at the hotel, he developed severe and sharp left upper arm and forearm pain. He has no sensation of his index and left middle finger on the left.  Pain is much worse when he is supine and has found that raising his arm provides some relief.  He has noticed increased weakness of his left hand and often drops objects.  He has tried prednisone, gabapentin, and NSAIDs which helps some, but does not completely alleviate the pain.   He endorses suffering a mechanical fall in December 2016, there was no loss of consciousness or neck pain, but is concern that this may have contributed to his new arm pain.   He has history of cervical decompression and fusion x 2 previously for bilateral upper extremity pain and paresthesias.   MRI cervical spine in 2017 shows moderate canal stenosis at C2-3 and C4-5.  He completed PT which helped with left shoulder ROM, no benefit with paresthesias.  Upon further questioning, he recalls having tingling and numbness of the fingers prior to his cervical decompression and that symptoms have not improved.    NCS/EMG of the arms (06/2016) showed bilateral CTS (very mild on the left), mild bilateral ulnar neuropathy and chronic left C6 radiculopathy.    UPDATE 12/28/2017:  He is here for follow-up visit.  He complains of tinging over the right ear and  jaw, as if being sun burnt in the area.  Symptoms started 3 weeks ago in the setting of a viral infection, which is less frequent occurring about 2 times per day, previously occurring multiple times per day.  No vision changes, facial weakness, or left facial numbness.    He also complains of spells of dizziness when laying back in bed to sleep at night, described as spinning sensation with some nausea. No ear pain, tinnitus, or drainage.   He continues to be bothered by lack of sensation and tingling in the fingertips, which is worse on the left.  Symptoms do not wake him up from sleeping.   He also has fatigue with doing activities with his arms/hands.  His left arm has improved arm pain.  Medications:  Current Outpatient Medications on File Prior to Visit  Medication Sig Dispense Refill  . amLODipine (NORVASC) 5 MG tablet Take 1 tablet (5 mg total) by mouth daily. 90 tablet 3  . aspirin 81 MG tablet Take 81 mg by mouth daily.      . B-D ULTRAFINE III SHORT PEN 31G X 8 MM MISC USE AS DIRECTED 200 each 3  . cholecalciferol (VITAMIN D) 1000 units tablet Take 3,000 Units by mouth daily.    . empagliflozin (JARDIANCE) 10 MG TABS tablet Take 10 mg by mouth daily. 90 tablet 3  . furosemide (LASIX) 40 MG tablet Take 1 tablet (40 mg total) by mouth daily. 90 tablet 3  . losartan (COZAAR) 100 MG tablet TAKE  1 TABLET BY MOUTH EVERY DAY 90 tablet 1  . metoprolol succinate (TOPROL-XL) 25 MG 24 hr tablet TAKE 1 TABLET BY MOUTH EVERY DAY 90 tablet 1  . pravastatin (PRAVACHOL) 40 MG tablet Take 1 tablet (40 mg total) by mouth daily. 90 tablet 3  . tamsulosin (FLOMAX) 0.4 MG CAPS capsule Take 1 capsule (0.4 mg total) by mouth daily after supper. 90 capsule 3  . TOUJEO SOLOSTAR 300 UNIT/ML SOPN INJECT 150 UNITS INTO THE SKIN EVERY MORNING. TITRATE UP BY 1 UNIT A DAY FOR GOAL SUGARS 100-130 31.5 pen 5   No current facility-administered medications on file prior to visit.     Allergies:  Allergies  Allergen  Reactions  . Amoxicillin Nausea And Vomiting  . Lisinopril     Cough   . Penicillins     Per pt: unknown  . Simvastatin     myalgia    Review of Systems:  CONSTITUTIONAL: No fevers, chills, night sweats, or weight loss.  EYES: No visual changes or eye pain ENT: No hearing changes.  No history of nose bleeds.   RESPIRATORY: No cough, wheezing and shortness of breath.   CARDIOVASCULAR: Negative for chest pain, and palpitations.   GI: Negative for abdominal discomfort, blood in stools or black stools.  No recent change in bowel habits.   GU:  No history of incontinence.   MUSCLOSKELETAL: +history of joint pain or swelling.  No myalgias.   SKIN: Negative for lesions, rash, and itching.   ENDOCRINE: Negative for cold or heat intolerance, polydipsia or goiter.   PSYCH:  No depression or anxiety symptoms.   NEURO: As Above.   Vital Signs:  BP (!) 162/70   Pulse 76   Ht 5\' 10"  (1.778 m)   Wt 286 lb (129.7 kg)   SpO2 97%   BMI 41.04 kg/m   General Medical Exam:   General:  Well appearing, comfortable  Eyes/ENT: see cranial nerve examination.   Neck: No masses appreciated.  Full range of motion without tenderness.  No carotid bruits. Respiratory:  Clear to auscultation, good air entry bilaterally.   Cardiac:  Regular rate and rhythm, no murmur.   Ext:  No edema  Neurological Exam: MENTAL STATUS including orientation to time, place, person, recent and remote memory, attention span and concentration, language, and fund of knowledge is normal.  Speech is not dysarthric.  CRANIAL NERVES:  No visual field defects. Pupils equal round and reactive to light.  Normal conjugate, extra-ocular eye movements in all directions of gaze.  No ptosis. Normal facial sensation.  Face is symmetric. Palate elevates symmetrically.  Tongue is midline.  MOTOR:  Motor strength is 5/5 in all extremities.  No atrophy, fasciculations or abnormal movements.  No pronator drift.  Tone is normal.    MSRs:   Reflexes are 2+/4 throughout  SENSORY:  Intact to temperature, pin prick, and vibration throughout.  COORDINATION/GAIT:  Normal finger-to- nose-finger.  Intact rapid alternating movements bilaterally.  Gait wide-based and stable.    Data: MRI cervical spine wo contrast 10/02/2015 performed at [facility]:  1. Post-operative changes at C5-6.  No obvious acute or chronic complication. 2. Moderate central stenosis at C2-3 due to posterior soft tissue hypertrophy.  Less severe central stenosis at C4-5 3. Small disc bulge protrusion at T1-2.  NCS/EMG 06/25/2016: 1. Bilateral ulnar neuropathy with slowing across the elbow, purely demyelinating in type. 2. Bilateral median neuropathy at the wrist, consistent with the clinical diagnosis of carpal tunnel syndrome. Overall, these  findings are moderate in degree electrically on the right and very mild on the left. 3. Chronic C6 radiculopathy affecting the left upper extremity; moderate in degree electrically  IMPRESSION: Bilateral hand paresthesias and subjective upper extremity weakness, worse on the left.  He has history of cervical decompression and fusion at C5-6 and repeat imaging from 2017 shows cervical canal stenosis at C2-3 and C4-5 (moderate).  Long discussion on work-up to date and discussing that symptoms are most likely residual from prior nerve injury as NCS/EMG continues to show left C6 radiculopathy.  Since symptoms were present prior to surgery, this is unlikely to improve.   - Although he has bilateral ulnar neuropathy and CTS, these findings are too mild to manifest with the severity of his pain and paresthesias  - He has no benefit with neck PT  - I offered referral to spine specialist to consider ESI, which he would like to consider only if symptoms get worse.    - For pain, continue gabapentin 600mg  twice daily and baclofen 10mg  at bedtime  BPPV is suspected given positional changes that trigger vertigo, however his Marye Round is  negative.  - Start OTC meclizine 12.5mg  twice daily as needed   Right facial paresthesia, normal cranial nerve exam is reassuring.    - Symptoms are improving and possibly related to viral illness.  - Consider MRI brain, if symptoms persist or get worse  Return to clinic in 1 year  Greater than 50% of this 40 minute visit was spent in counseling, explanation of diagnosis, planning of further management, and coordination of care.   Thank you for allowing me to participate in patient's care.  If I can answer any additional questions, I would be pleased to do so.    Sincerely,    Donika K. Posey Pronto, DO

## 2017-12-29 ENCOUNTER — Encounter: Payer: Self-pay | Admitting: Internal Medicine

## 2017-12-29 ENCOUNTER — Ambulatory Visit (INDEPENDENT_AMBULATORY_CARE_PROVIDER_SITE_OTHER): Payer: PPO | Admitting: Internal Medicine

## 2017-12-29 DIAGNOSIS — Z794 Long term (current) use of insulin: Secondary | ICD-10-CM

## 2017-12-29 DIAGNOSIS — I2510 Atherosclerotic heart disease of native coronary artery without angina pectoris: Secondary | ICD-10-CM

## 2017-12-29 DIAGNOSIS — E1165 Type 2 diabetes mellitus with hyperglycemia: Secondary | ICD-10-CM

## 2017-12-29 DIAGNOSIS — I10 Essential (primary) hypertension: Secondary | ICD-10-CM | POA: Diagnosis not present

## 2017-12-29 MED ORDER — PROMETHAZINE-CODEINE 6.25-10 MG/5ML PO SYRP: 5.0000 mL | ORAL_SOLUTION | ORAL | 0 refills | Status: DC | PRN

## 2017-12-29 MED ORDER — INSULIN GLARGINE 300 UNIT/ML ~~LOC~~ SOPN: 150.0000 [IU] | PEN_INJECTOR | SUBCUTANEOUS | 5 refills | Status: DC

## 2017-12-29 MED ORDER — FUROSEMIDE 40 MG PO TABS: 40.0000 mg | ORAL_TABLET | Freq: Every day | ORAL | 3 refills | Status: DC

## 2017-12-29 MED ORDER — LOSARTAN POTASSIUM 100 MG PO TABS: 100.0000 mg | ORAL_TABLET | Freq: Every day | ORAL | 3 refills | Status: DC

## 2017-12-29 MED ORDER — AMLODIPINE BESYLATE 5 MG PO TABS: 5.0000 mg | ORAL_TABLET | Freq: Every day | ORAL | 3 refills | Status: DC

## 2017-12-29 MED ORDER — PRAVASTATIN SODIUM 40 MG PO TABS: 40.0000 mg | ORAL_TABLET | Freq: Every day | ORAL | 3 refills | Status: DC

## 2017-12-29 MED ORDER — BD PEN NEEDLE SHORT U/F 31G X 8 MM MISC: 3 refills | Status: DC

## 2017-12-29 MED ORDER — EMPAGLIFLOZIN 10 MG PO TABS: 10.0000 mg | ORAL_TABLET | Freq: Every day | ORAL | 3 refills | Status: DC

## 2017-12-29 MED ORDER — TAMSULOSIN HCL 0.4 MG PO CAPS: 0.4000 mg | ORAL_CAPSULE | Freq: Every day | ORAL | 3 refills | Status: DC

## 2017-12-29 MED ORDER — METOPROLOL SUCCINATE ER 25 MG PO TB24: 25.0000 mg | ORAL_TABLET | Freq: Every day | ORAL | 3 refills | Status: DC

## 2017-12-29 NOTE — Assessment & Plan Note (Signed)
Toujeo Jardiance  Potential benefits of a long term Jardiance use as well as potential risks  and complications were explained to the patient and were aknowledged.

## 2017-12-29 NOTE — Assessment & Plan Note (Signed)
Toprol XL, ASA, Amlodipine, Crestor 

## 2017-12-29 NOTE — Assessment & Plan Note (Signed)
  Toprol XL, ASA, Amlodipine, Losartan

## 2017-12-29 NOTE — Progress Notes (Signed)
Subjective:  Patient ID: [name] [name], male    DOB: 06/17/1950  Age: 67 y.o. MRN: [id number]  CC: No chief complaint on file.   HPI Evyn Putzier presents for a URI - got sick on a cruise, used a Z pac. F/u DM, obesity, HTN f/u  Outpatient Medications Prior to Visit  Medication Sig Dispense Refill  . amLODipine (NORVASC) 5 MG tablet Take 1 tablet (5 mg total) by mouth daily. 90 tablet 3  . aspirin 81 MG tablet Take 81 mg by mouth daily.      . B-D ULTRAFINE III SHORT PEN 31G X 8 MM MISC USE AS DIRECTED 200 each 3  . baclofen (LIORESAL) 10 MG tablet Take 1 tablet (10 mg total) by mouth at bedtime. 90 each 3  . cholecalciferol (VITAMIN D) 1000 units tablet Take 3,000 Units by mouth daily.    . empagliflozin (JARDIANCE) 10 MG TABS tablet Take 10 mg by mouth daily. 90 tablet 3  . furosemide (LASIX) 40 MG tablet Take 1 tablet (40 mg total) by mouth daily. 90 tablet 3  . gabapentin (NEURONTIN) 600 MG tablet Take 1 tablet (600 mg total) by mouth 2 (two) times daily. 180 tablet 3  . losartan (COZAAR) 100 MG tablet TAKE 1 TABLET BY MOUTH EVERY DAY 90 tablet 1  . metoprolol succinate (TOPROL-XL) 25 MG 24 hr tablet TAKE 1 TABLET BY MOUTH EVERY DAY 90 tablet 1  . pravastatin (PRAVACHOL) 40 MG tablet Take 1 tablet (40 mg total) by mouth daily. 90 tablet 3  . tamsulosin (FLOMAX) 0.4 MG CAPS capsule Take 1 capsule (0.4 mg total) by mouth daily after supper. 90 capsule 3  . TOUJEO SOLOSTAR 300 UNIT/ML SOPN INJECT 150 UNITS INTO THE SKIN EVERY MORNING. TITRATE UP BY 1 UNIT A DAY FOR GOAL SUGARS 100-130 31.5 pen 5   No facility-administered medications prior to visit.     ROS: Review of Systems  Constitutional: Negative for appetite change, fatigue and unexpected weight change.  HENT: Negative for congestion, nosebleeds, sneezing, sore throat and trouble swallowing.   Eyes: Negative for itching and visual disturbance.  Respiratory: Positive for cough.   Cardiovascular: Negative for chest  pain, palpitations and leg swelling.  Gastrointestinal: Negative for abdominal distention, blood in stool, diarrhea and nausea.  Genitourinary: Negative for frequency and hematuria.  Musculoskeletal: Negative for back pain, gait problem, joint swelling and neck pain.  Skin: Negative for rash.  Neurological: Negative for dizziness, tremors, speech difficulty and weakness.  Psychiatric/Behavioral: Negative for agitation, dysphoric mood, sleep disturbance and suicidal ideas. The patient is not nervous/anxious.     Objective:  BP 126/82 (BP Location: Left Arm, Patient Position: Sitting, Cuff Size: Large)   Pulse 85   Temp 98.2 F (36.8 C) (Oral)   Ht 5\' 10"  (1.778 m)   Wt 284 lb (128.8 kg)   SpO2 98%   BMI 40.75 kg/m   BP Readings from Last 3 Encounters:  12/29/17 126/82  12/28/17 (!) 162/70  10/24/17 116/74    Wt Readings from Last 3 Encounters:  12/29/17 284 lb (128.8 kg)  12/28/17 286 lb (129.7 kg)  10/24/17 278 lb (126.1 kg)    Physical Exam  Constitutional: He is oriented to person, place, and time. He appears well-developed. No distress.  NAD  HENT:  Mouth/Throat: Oropharynx is clear and moist.  Eyes: Pupils are equal, round, and reactive to light. Conjunctivae are normal.  Neck: Normal range of motion. No JVD present. No thyromegaly present.  Cardiovascular:  Normal rate, regular rhythm, normal heart sounds and intact distal pulses. Exam reveals no gallop and no friction rub.  No murmur heard. Pulmonary/Chest: Effort normal and breath sounds normal. No respiratory distress. He has no wheezes. He has no rales. He exhibits no tenderness.  Abdominal: Soft. Bowel sounds are normal. He exhibits no distension and no mass. There is no tenderness. There is no rebound and no guarding.  Musculoskeletal: Normal range of motion. He exhibits no edema or tenderness.  Lymphadenopathy:    He has no cervical adenopathy.  Neurological: He is alert and oriented to person, place, and  time. He has normal reflexes. No cranial nerve deficit. He exhibits normal muscle tone. He displays a negative Romberg sign. Coordination and gait normal.  Skin: Skin is warm and dry. No rash noted.  Psychiatric: He has a normal mood and affect. His behavior is normal. Judgment and thought content normal.    Lab Results  Component Value Date   WBC 6.9 07/29/2017   HGB 15.2 07/29/2017   HCT 45.2 07/29/2017   PLT 286.0 07/29/2017   GLUCOSE 245 (H) 09/28/2017   CHOL 252 (H) 07/29/2017   TRIG 140.0 07/29/2017   HDL 48.80 07/29/2017   LDLDIRECT 200.0 02/17/2016   LDLCALC 175 (H) 07/29/2017   ALT 14 07/29/2017   AST 13 07/29/2017   NA 139 09/28/2017   K 4.5 09/28/2017   CL 101 09/28/2017   CREATININE 1.26 09/28/2017   BUN 24 (H) 09/28/2017   CO2 31 09/28/2017   TSH 4.14 07/29/2017   PSA 0.78 07/29/2017   INR 1.08 02/02/2011   HGBA1C 7.2 (H) 09/28/2017   MICROALBUR 195.1 (H) 02/17/2016    No results found.  Assessment & Plan:   There are no diagnoses linked to this encounter.   No orders of the defined types were placed in this encounter.    Follow-up: No follow-ups on file.  Walker Kehr, MD

## 2018-01-24 DIAGNOSIS — L570 Actinic keratosis: Secondary | ICD-10-CM | POA: Diagnosis not present

## 2018-01-24 DIAGNOSIS — D485 Neoplasm of uncertain behavior of skin: Secondary | ICD-10-CM | POA: Diagnosis not present

## 2018-01-24 DIAGNOSIS — B079 Viral wart, unspecified: Secondary | ICD-10-CM | POA: Diagnosis not present

## 2018-01-24 DIAGNOSIS — L819 Disorder of pigmentation, unspecified: Secondary | ICD-10-CM | POA: Diagnosis not present

## 2018-01-24 DIAGNOSIS — Z85820 Personal history of malignant melanoma of skin: Secondary | ICD-10-CM | POA: Diagnosis not present

## 2018-03-07 DIAGNOSIS — I10 Essential (primary) hypertension: Secondary | ICD-10-CM | POA: Diagnosis not present

## 2018-03-07 DIAGNOSIS — E1165 Type 2 diabetes mellitus with hyperglycemia: Secondary | ICD-10-CM | POA: Diagnosis not present

## 2018-03-07 DIAGNOSIS — R609 Edema, unspecified: Secondary | ICD-10-CM | POA: Diagnosis not present

## 2018-03-07 DIAGNOSIS — E7800 Pure hypercholesterolemia, unspecified: Secondary | ICD-10-CM | POA: Diagnosis not present

## 2018-03-26 ENCOUNTER — Other Ambulatory Visit: Payer: Self-pay | Admitting: Internal Medicine

## 2018-04-04 ENCOUNTER — Encounter: Payer: Self-pay | Admitting: Internal Medicine

## 2018-04-04 ENCOUNTER — Ambulatory Visit (INDEPENDENT_AMBULATORY_CARE_PROVIDER_SITE_OTHER): Payer: PPO | Admitting: Internal Medicine

## 2018-04-04 DIAGNOSIS — I2510 Atherosclerotic heart disease of native coronary artery without angina pectoris: Secondary | ICD-10-CM | POA: Diagnosis not present

## 2018-04-04 DIAGNOSIS — R05 Cough: Secondary | ICD-10-CM | POA: Diagnosis not present

## 2018-04-04 DIAGNOSIS — E1165 Type 2 diabetes mellitus with hyperglycemia: Secondary | ICD-10-CM

## 2018-04-04 DIAGNOSIS — Z794 Long term (current) use of insulin: Secondary | ICD-10-CM

## 2018-04-04 DIAGNOSIS — R059 Cough, unspecified: Secondary | ICD-10-CM

## 2018-04-04 MED ORDER — HYDROCOD POLST-CPM POLST ER 10-8 MG/5ML PO SUER: 5.0000 mL | Freq: Two times a day (BID) | ORAL | 0 refills | Status: DC | PRN

## 2018-04-04 MED ORDER — ZOSTER VAC RECOMB ADJUVANTED 50 MCG/0.5ML IM SUSR: 0.5000 mL | Freq: Once | INTRAMUSCULAR | 1 refills | Status: AC

## 2018-04-04 NOTE — Assessment & Plan Note (Signed)
F/u w/Dr Balan 

## 2018-04-04 NOTE — Progress Notes (Signed)
Subjective:  Patient ID: [name] [name], male    DOB: 07/11/1950  Age: 67 y.o. MRN: [id number]  CC: No chief complaint on file.   HPI [name] [name] presents for DM2, HTN, LBP, R leg pain, CAD f/u C/o cough - chronic C/o h/o melanoma  Outpatient Medications Prior to Visit  Medication Sig Dispense Refill  . amLODipine (NORVASC) 5 MG tablet Take 1 tablet (5 mg total) by mouth daily. 90 tablet 3  . aspirin 81 MG tablet Take 81 mg by mouth daily.      . B-D ULTRAFINE III SHORT PEN 31G X 8 MM MISC USE AS DIRECTED 200 each 3  . baclofen (LIORESAL) 10 MG tablet Take 1 tablet (10 mg total) by mouth at bedtime. 90 each 3  . cholecalciferol (VITAMIN D) 1000 units tablet Take 3,000 Units by mouth daily.    . empagliflozin (JARDIANCE) 10 MG TABS tablet Take 10 mg by mouth daily. 90 tablet 3  . furosemide (LASIX) 40 MG tablet Take 1 tablet (40 mg total) by mouth daily. 90 tablet 3  . gabapentin (NEURONTIN) 600 MG tablet Take 1 tablet (600 mg total) by mouth 2 (two) times daily. 180 tablet 3  . Insulin Glargine (TOUJEO SOLOSTAR) 300 UNIT/ML SOPN Inject 150 Units into the skin every morning. Marland Kitchen TITRATE UP BY 1 UNIT A DAY FOR GOAL SUGARS 100-130 31.5 pen 5  . losartan (COZAAR) 100 MG tablet Take 1 tablet (100 mg total) by mouth daily. 90 tablet 3  . losartan (COZAAR) 100 MG tablet TAKE 1 TABLET BY MOUTH EVERY DAY 90 tablet 1  . metoprolol succinate (TOPROL-XL) 25 MG 24 hr tablet Take 1 tablet (25 mg total) by mouth daily. 90 tablet 3  . pravastatin (PRAVACHOL) 40 MG tablet Take 1 tablet (40 mg total) by mouth daily. 90 tablet 3  . promethazine-codeine (PHENERGAN WITH CODEINE) 6.25-10 MG/5ML syrup Take 5 mLs by mouth every 4 (four) hours as needed. 300 mL 0  . tamsulosin (FLOMAX) 0.4 MG CAPS capsule Take 1 capsule (0.4 mg total) by mouth daily after supper. 90 capsule 3   No facility-administered medications prior to visit.     ROS: Review of Systems  Constitutional: Negative for appetite change,  fatigue and unexpected weight change.  HENT: Negative for congestion, nosebleeds, sneezing, sore throat and trouble swallowing.   Eyes: Negative for itching and visual disturbance.  Respiratory: Positive for cough.   Cardiovascular: Negative for chest pain, palpitations and leg swelling.  Gastrointestinal: Negative for abdominal distention, blood in stool, diarrhea and nausea.  Genitourinary: Negative for frequency and hematuria.  Musculoskeletal: Positive for back pain. Negative for gait problem, joint swelling and neck pain.  Skin: Negative for rash.  Neurological: Negative for dizziness, tremors, speech difficulty and weakness.  Psychiatric/Behavioral: Negative for agitation, dysphoric mood and sleep disturbance. The patient is not nervous/anxious.     Objective:  BP 128/80 (BP Location: Left Arm, Patient Position: Sitting, Cuff Size: Large)   Pulse 68   Temp 98.4 F (36.9 C) (Oral)   Ht 5\' 10"  (1.778 m)   Wt 276 lb (125.2 kg)   SpO2 96%   BMI 39.60 kg/m   BP Readings from Last 3 Encounters:  04/04/18 128/80  12/29/17 126/82  12/28/17 (!) 162/70    Wt Readings from Last 3 Encounters:  04/04/18 276 lb (125.2 kg)  12/29/17 284 lb (128.8 kg)  12/28/17 286 lb (129.7 kg)    Physical Exam  Constitutional: He is oriented to person, place,  and time. He appears well-developed. No distress.  NAD  HENT:  Mouth/Throat: Oropharynx is clear and moist.  Eyes: Pupils are equal, round, and reactive to light. Conjunctivae are normal.  Neck: Normal range of motion. No JVD present. No thyromegaly present.  Cardiovascular: Normal rate, regular rhythm, normal heart sounds and intact distal pulses. Exam reveals no gallop and no friction rub.  No murmur heard. Pulmonary/Chest: Effort normal and breath sounds normal. No respiratory distress. He has no wheezes. He has no rales. He exhibits no tenderness.  Abdominal: Soft. Bowel sounds are normal. He exhibits no distension and no mass. There is  no tenderness. There is no rebound and no guarding.  Musculoskeletal: Normal range of motion. He exhibits no edema or tenderness.  Lymphadenopathy:    He has no cervical adenopathy.  Neurological: He is alert and oriented to person, place, and time. He has normal reflexes. No cranial nerve deficit. He exhibits normal muscle tone. He displays a negative Romberg sign. Coordination and gait normal.  Skin: Skin is warm and dry. No rash noted.  Psychiatric: He has a normal mood and affect. His behavior is normal. Judgment and thought content normal.  obese  Lab Results  Component Value Date   WBC 6.9 07/29/2017   HGB 15.2 07/29/2017   HCT 45.2 07/29/2017   PLT 286.0 07/29/2017   GLUCOSE 245 (H) 09/28/2017   CHOL 252 (H) 07/29/2017   TRIG 140.0 07/29/2017   HDL 48.80 07/29/2017   LDLDIRECT 200.0 02/17/2016   LDLCALC 175 (H) 07/29/2017   ALT 14 07/29/2017   AST 13 07/29/2017   NA 139 09/28/2017   K 4.5 09/28/2017   CL 101 09/28/2017   CREATININE 1.26 09/28/2017   BUN 24 (H) 09/28/2017   CO2 31 09/28/2017   TSH 4.14 07/29/2017   PSA 0.78 07/29/2017   INR 1.08 02/02/2011   HGBA1C 7.2 (H) 09/28/2017   MICROALBUR 195.1 (H) 02/17/2016    No results found.  Assessment & Plan:   There are no diagnoses linked to this encounter.   No orders of the defined types were placed in this encounter.    Follow-up: No follow-ups on file.  Walker Kehr, MD

## 2018-04-04 NOTE — Assessment & Plan Note (Addendum)
Toprol XL, ASA, Amlodipine, Crestor Card ref was suggested

## 2018-04-04 NOTE — Assessment & Plan Note (Signed)
Tussionex prn  Potential benefits of opioids use as well as potential risks (i.e. addiction risk, apnea etc) and complications (i.e. Somnolence, constipation and others) were explained to the patient and were aknowledged.

## 2018-04-10 DIAGNOSIS — D229 Melanocytic nevi, unspecified: Secondary | ICD-10-CM | POA: Diagnosis not present

## 2018-04-10 DIAGNOSIS — D485 Neoplasm of uncertain behavior of skin: Secondary | ICD-10-CM | POA: Diagnosis not present

## 2018-04-10 DIAGNOSIS — D044 Carcinoma in situ of skin of scalp and neck: Secondary | ICD-10-CM | POA: Diagnosis not present

## 2018-04-10 DIAGNOSIS — L814 Other melanin hyperpigmentation: Secondary | ICD-10-CM | POA: Diagnosis not present

## 2018-04-10 DIAGNOSIS — L738 Other specified follicular disorders: Secondary | ICD-10-CM | POA: Diagnosis not present

## 2018-04-10 DIAGNOSIS — L570 Actinic keratosis: Secondary | ICD-10-CM | POA: Diagnosis not present

## 2018-04-10 DIAGNOSIS — L821 Other seborrheic keratosis: Secondary | ICD-10-CM | POA: Diagnosis not present

## 2018-04-10 DIAGNOSIS — D1801 Hemangioma of skin and subcutaneous tissue: Secondary | ICD-10-CM | POA: Diagnosis not present

## 2018-04-10 DIAGNOSIS — C4442 Squamous cell carcinoma of skin of scalp and neck: Secondary | ICD-10-CM | POA: Diagnosis not present

## 2018-07-05 ENCOUNTER — Encounter: Payer: Self-pay | Admitting: Internal Medicine

## 2018-07-05 ENCOUNTER — Ambulatory Visit (INDEPENDENT_AMBULATORY_CARE_PROVIDER_SITE_OTHER): Payer: PPO | Admitting: Internal Medicine

## 2018-07-05 VITALS — BP 132/76 | HR 74 | Temp 98.3°F | Ht 70.0 in | Wt 272.0 lb

## 2018-07-05 DIAGNOSIS — I10 Essential (primary) hypertension: Secondary | ICD-10-CM

## 2018-07-05 DIAGNOSIS — E785 Hyperlipidemia, unspecified: Secondary | ICD-10-CM

## 2018-07-05 DIAGNOSIS — R198 Other specified symptoms and signs involving the digestive system and abdomen: Secondary | ICD-10-CM | POA: Diagnosis not present

## 2018-07-05 DIAGNOSIS — I2510 Atherosclerotic heart disease of native coronary artery without angina pectoris: Secondary | ICD-10-CM | POA: Diagnosis not present

## 2018-07-05 DIAGNOSIS — E1165 Type 2 diabetes mellitus with hyperglycemia: Secondary | ICD-10-CM

## 2018-07-05 DIAGNOSIS — N320 Bladder-neck obstruction: Secondary | ICD-10-CM

## 2018-07-05 DIAGNOSIS — Z794 Long term (current) use of insulin: Secondary | ICD-10-CM | POA: Diagnosis not present

## 2018-07-05 MED ORDER — HYDROCOD POLST-CPM POLST ER 10-8 MG/5ML PO SUER: 5.0000 mL | Freq: Two times a day (BID) | ORAL | 0 refills | Status: DC | PRN

## 2018-07-05 NOTE — Assessment & Plan Note (Signed)
Pravastatin  

## 2018-07-05 NOTE — Progress Notes (Signed)
Subjective:  Patient ID: [name] [name], male    DOB: November 08, 1950  Age: 68 y.o. MRN: [id number]  CC: No chief complaint on file.   HPI [name] [name] presents for DM, obesity, HTN f/u C/o rectal d/c, seepage  Outpatient Medications Prior to Visit  Medication Sig Dispense Refill  . amLODipine (NORVASC) 5 MG tablet Take 1 tablet (5 mg total) by mouth daily. 90 tablet 3  . aspirin 81 MG tablet Take 81 mg by mouth daily.      . B-D ULTRAFINE III SHORT PEN 31G X 8 MM MISC USE AS DIRECTED 200 each 3  . baclofen (LIORESAL) 10 MG tablet Take 1 tablet (10 mg total) by mouth at bedtime. 90 each 3  . chlorpheniramine-HYDROcodone (TUSSIONEX PENNKINETIC ER) 10-8 MG/5ML SUER Take 5 mLs by mouth every 12 (twelve) hours as needed for cough. 115 mL 0  . cholecalciferol (VITAMIN D) 1000 units tablet Take 3,000 Units by mouth daily.    . empagliflozin (JARDIANCE) 10 MG TABS tablet Take 10 mg by mouth daily. 90 tablet 3  . furosemide (LASIX) 40 MG tablet Take 1 tablet (40 mg total) by mouth daily. 90 tablet 3  . gabapentin (NEURONTIN) 600 MG tablet Take 1 tablet (600 mg total) by mouth 2 (two) times daily. 180 tablet 3  . Insulin Glargine (TOUJEO SOLOSTAR) 300 UNIT/ML SOPN Inject 150 Units into the skin every morning. Marland Kitchen TITRATE UP BY 1 UNIT A DAY FOR GOAL SUGARS 100-130 31.5 pen 5  . losartan (COZAAR) 100 MG tablet Take 1 tablet (100 mg total) by mouth daily. 90 tablet 3  . losartan (COZAAR) 100 MG tablet TAKE 1 TABLET BY MOUTH EVERY DAY 90 tablet 1  . metoprolol succinate (TOPROL-XL) 25 MG 24 hr tablet Take 1 tablet (25 mg total) by mouth daily. 90 tablet 3  . pravastatin (PRAVACHOL) 40 MG tablet Take 1 tablet (40 mg total) by mouth daily. 90 tablet 3  . tamsulosin (FLOMAX) 0.4 MG CAPS capsule Take 1 capsule (0.4 mg total) by mouth daily after supper. 90 capsule 3   No facility-administered medications prior to visit.     ROS: Review of Systems  Constitutional: Negative for appetite change, fatigue  and unexpected weight change.  HENT: Negative for congestion, nosebleeds, sneezing, sore throat and trouble swallowing.   Eyes: Negative for itching and visual disturbance.  Respiratory: Negative for cough.   Cardiovascular: Negative for chest pain, palpitations and leg swelling.  Gastrointestinal: Negative for abdominal distention, blood in stool, diarrhea and nausea.  Genitourinary: Negative for frequency and hematuria.  Musculoskeletal: Negative for back pain, gait problem, joint swelling and neck pain.  Skin: Negative for rash.  Neurological: Negative for dizziness, tremors, speech difficulty and weakness.  Psychiatric/Behavioral: Negative for agitation, dysphoric mood, sleep disturbance and suicidal ideas. The patient is not nervous/anxious.     Objective:  BP 132/76 (BP Location: Left Arm, Patient Position: Sitting, Cuff Size: Large)   Pulse 74   Temp 98.3 F (36.8 C) (Oral)   Ht 5\' 10"  (1.778 m)   Wt 272 lb (123.4 kg)   SpO2 96%   BMI 39.03 kg/m   BP Readings from Last 3 Encounters:  07/05/18 132/76  04/04/18 128/80  12/29/17 126/82    Wt Readings from Last 3 Encounters:  07/05/18 272 lb (123.4 kg)  04/04/18 276 lb (125.2 kg)  12/29/17 284 lb (128.8 kg)    Physical Exam Constitutional:      General: He is not in acute distress.  Appearance: He is well-developed.     Comments: NAD  Eyes:     Conjunctiva/sclera: Conjunctivae normal.     Pupils: Pupils are equal, round, and reactive to light.  Neck:     Musculoskeletal: Normal range of motion.     Thyroid: No thyromegaly.     Vascular: No JVD.  Cardiovascular:     Rate and Rhythm: Normal rate and regular rhythm.     Heart sounds: Normal heart sounds. No murmur. No friction rub. No gallop.   Pulmonary:     Effort: Pulmonary effort is normal. No respiratory distress.     Breath sounds: Normal breath sounds. No wheezing or rales.  Chest:     Chest wall: No tenderness.  Abdominal:     General: Bowel sounds  are normal. There is no distension.     Palpations: Abdomen is soft. There is no mass.     Tenderness: There is no abdominal tenderness. There is no guarding or rebound.  Musculoskeletal: Normal range of motion.        General: No tenderness.  Lymphadenopathy:     Cervical: No cervical adenopathy.  Skin:    General: Skin is warm and dry.     Findings: No rash.  Neurological:     Mental Status: He is alert and oriented to person, place, and time.     Cranial Nerves: No cranial nerve deficit.     Motor: No abnormal muscle tone.     Coordination: Coordination normal.     Gait: Gait normal.     Deep Tendon Reflexes: Reflexes are normal and symmetric.  Psychiatric:        Behavior: Behavior normal.        Thought Content: Thought content normal.        Judgment: Judgment normal.   obese   Lab Results  Component Value Date   WBC 6.9 07/29/2017   HGB 15.2 07/29/2017   HCT 45.2 07/29/2017   PLT 286.0 07/29/2017   GLUCOSE 245 (H) 09/28/2017   CHOL 252 (H) 07/29/2017   TRIG 140.0 07/29/2017   HDL 48.80 07/29/2017   LDLDIRECT 200.0 02/17/2016   LDLCALC 175 (H) 07/29/2017   ALT 14 07/29/2017   AST 13 07/29/2017   NA 139 09/28/2017   K 4.5 09/28/2017   CL 101 09/28/2017   CREATININE 1.26 09/28/2017   BUN 24 (H) 09/28/2017   CO2 31 09/28/2017   TSH 4.14 07/29/2017   PSA 0.78 07/29/2017   INR 1.08 02/02/2011   HGBA1C 7.2 (H) 09/28/2017   MICROALBUR 195.1 (H) 02/17/2016    No results found.  Assessment & Plan:   There are no diagnoses linked to this encounter.   No orders of the defined types were placed in this encounter.    Follow-up: No follow-ups on file.  Walker Kehr, MD

## 2018-07-05 NOTE — Assessment & Plan Note (Signed)
Appt w/Dr Carlean Purl

## 2018-07-05 NOTE — Assessment & Plan Note (Signed)
Toprol XL, ASA, Amlodipine, Losartan

## 2018-07-05 NOTE — Assessment & Plan Note (Signed)
Wt loss F/u w/Dr Chalmers Cater

## 2018-07-05 NOTE — Assessment & Plan Note (Signed)
Toprol XL, ASA, Amlodipine, Crestor

## 2018-07-05 NOTE — Assessment & Plan Note (Signed)
F/u w/Dr Balan 

## 2018-07-06 ENCOUNTER — Other Ambulatory Visit (INDEPENDENT_AMBULATORY_CARE_PROVIDER_SITE_OTHER): Payer: PPO

## 2018-07-06 DIAGNOSIS — I2510 Atherosclerotic heart disease of native coronary artery without angina pectoris: Secondary | ICD-10-CM

## 2018-07-06 DIAGNOSIS — N320 Bladder-neck obstruction: Secondary | ICD-10-CM

## 2018-07-06 DIAGNOSIS — E785 Hyperlipidemia, unspecified: Secondary | ICD-10-CM | POA: Diagnosis not present

## 2018-07-06 DIAGNOSIS — I10 Essential (primary) hypertension: Secondary | ICD-10-CM

## 2018-07-06 LAB — CBC WITH DIFFERENTIAL/PLATELET
Basophils Absolute: 0 10*3/uL (ref 0.0–0.1)
Basophils Relative: 0.4 % (ref 0.0–3.0)
Eosinophils Absolute: 0.3 10*3/uL (ref 0.0–0.7)
Eosinophils Relative: 4.9 % (ref 0.0–5.0)
HCT: 43.2 % (ref 39.0–52.0)
Hemoglobin: 14.6 g/dL (ref 13.0–17.0)
LYMPHS ABS: 1.5 10*3/uL (ref 0.7–4.0)
Lymphocytes Relative: 22.1 % (ref 12.0–46.0)
MCHC: 33.9 g/dL (ref 30.0–36.0)
MCV: 79.3 fl (ref 78.0–100.0)
Monocytes Absolute: 0.5 10*3/uL (ref 0.1–1.0)
Monocytes Relative: 6.7 % (ref 3.0–12.0)
Neutro Abs: 4.4 10*3/uL (ref 1.4–7.7)
Neutrophils Relative %: 65.9 % (ref 43.0–77.0)
Platelets: 273 10*3/uL (ref 150.0–400.0)
RBC: 5.44 Mil/uL (ref 4.22–5.81)
RDW: 14.7 % (ref 11.5–15.5)
WBC: 6.7 10*3/uL (ref 4.0–10.5)

## 2018-07-06 LAB — HEPATIC FUNCTION PANEL
ALT: 14 U/L (ref 0–53)
AST: 13 U/L (ref 0–37)
Albumin: 3.7 g/dL (ref 3.5–5.2)
Alkaline Phosphatase: 82 U/L (ref 39–117)
Bilirubin, Direct: 0.1 mg/dL (ref 0.0–0.3)
Total Bilirubin: 0.5 mg/dL (ref 0.2–1.2)
Total Protein: 6.2 g/dL (ref 6.0–8.3)

## 2018-07-06 LAB — LIPID PANEL
Cholesterol: 187 mg/dL (ref 0–200)
HDL: 46 mg/dL (ref >39.00)
LDL Cholesterol: 117 mg/dL — ABNORMAL HIGH (ref 0–99)
NonHDL: 141.28
Total CHOL/HDL Ratio: 4
Triglycerides: 123 mg/dL (ref 0.0–149.0)
VLDL: 24.6 mg/dL (ref 0.0–40.0)

## 2018-07-06 LAB — TSH: TSH: 3.57 u[IU]/mL (ref 0.35–4.50)

## 2018-07-06 LAB — BASIC METABOLIC PANEL
BUN: 24 mg/dL — ABNORMAL HIGH (ref 6–23)
CO2: 28 mEq/L (ref 19–32)
Calcium: 8.7 mg/dL (ref 8.4–10.5)
Chloride: 103 mEq/L (ref 96–112)
Creatinine, Ser: 1.22 mg/dL (ref 0.40–1.50)
GFR: 59.09 mL/min — ABNORMAL LOW (ref >60.00)
Glucose, Bld: 96 mg/dL (ref 70–99)
Potassium: 4.5 mEq/L (ref 3.5–5.1)
Sodium: 140 mEq/L (ref 135–145)

## 2018-07-06 LAB — PSA: PSA: 0.59 ng/mL (ref 0.10–4.00)

## 2018-07-10 DIAGNOSIS — I10 Essential (primary) hypertension: Secondary | ICD-10-CM | POA: Diagnosis not present

## 2018-07-10 DIAGNOSIS — R609 Edema, unspecified: Secondary | ICD-10-CM | POA: Diagnosis not present

## 2018-07-10 DIAGNOSIS — E1165 Type 2 diabetes mellitus with hyperglycemia: Secondary | ICD-10-CM | POA: Diagnosis not present

## 2018-07-10 DIAGNOSIS — R809 Proteinuria, unspecified: Secondary | ICD-10-CM | POA: Diagnosis not present

## 2018-07-10 DIAGNOSIS — E7800 Pure hypercholesterolemia, unspecified: Secondary | ICD-10-CM | POA: Diagnosis not present

## 2018-08-04 ENCOUNTER — Ambulatory Visit: Payer: PPO | Admitting: Internal Medicine

## 2018-08-25 ENCOUNTER — Ambulatory Visit: Payer: PPO | Admitting: Internal Medicine

## 2018-09-05 ENCOUNTER — Ambulatory Visit (INDEPENDENT_AMBULATORY_CARE_PROVIDER_SITE_OTHER): Payer: PPO | Admitting: Internal Medicine

## 2018-09-05 ENCOUNTER — Other Ambulatory Visit: Payer: Self-pay

## 2018-09-05 ENCOUNTER — Encounter: Payer: Self-pay | Admitting: Internal Medicine

## 2018-09-05 VITALS — Ht 70.0 in | Wt 278.0 lb

## 2018-09-05 DIAGNOSIS — R151 Fecal smearing: Secondary | ICD-10-CM | POA: Diagnosis not present

## 2018-09-05 DIAGNOSIS — K648 Other hemorrhoids: Secondary | ICD-10-CM | POA: Diagnosis not present

## 2018-09-05 NOTE — Progress Notes (Signed)
TELEHEALTH ENCOUNTER IN SETTING OF COVID-19 PANDEMIC - REQUESTED BY PATIENT SERVICE PROVIDED BY TELEMEDECINE - TYPE: Zoom AV PATIENT LOCATION: Home PATIENT HAS CONSENTED TO TELEHEALTH VISIT PROVIDER LOCATION: OFFICE REFERRING PROVIDER:Plotnikov PARTICIPANTS OTHER THAN PATIENT:None TIME SPENT ON CALL: 20 mins    [name] [name] 68 y.o. 05/12/1950 [id number]  Assessment & Plan:   Encounter Diagnoses  Name Primary?  . Fecal smearing Yes  . Internal hemorrhoids     Clinical scenario is compatible with fecal smearing/mild incontinence from internal hemorrhoids.  I think he is a good candidate for hemorrhoidal banding and we will set up a visit in the office for may with plans to evaluate with an anoscopy and likely band.  I have recommended he review the information on the [facility] hemorrhoid banding website.  I appreciate the opportunity to care for this patient. CC: Plotnikov, Evie Lacks, MD   Subjective:   Chief Complaint: Fecal leakage  HPI This 68 year old white male has been having months or more of intermittent fecal seepage and smearing into his gluteal area and into the underwear with small amounts of liquid stool that is a nuisance and significant quality of life impairment.  He does not strain to stool, he has regular easy defecation otherwise, he has tried some Preparation H with not much success, he does not have rectal pain or bleeding.  He had a colonoscopy by Dr. Erskine Emery in 2014 that did demonstrate internal hemorrhoids (images reviewed) as well as diverticulosis.  He denies signs or symptoms of prolapse though sometimes he can feel like he needs to defecate but it does not.  Currently since he is not working his part-time job at the [employer] is not as much of an issue but he would have problems midmorning after leaving the house on days he has defecated. Allergies  Allergen Reactions  . Amoxicillin Nausea And Vomiting  . Lisinopril     Cough   .  Penicillins     Per pt: unknown  . Simvastatin     myalgia   Current Meds  Medication Sig  . amLODipine (NORVASC) 5 MG tablet Take 1 tablet (5 mg total) by mouth daily.  Marland Kitchen aspirin 81 MG tablet Take 81 mg by mouth daily.    . B-D ULTRAFINE III SHORT PEN 31G X 8 MM MISC USE AS DIRECTED  . chlorpheniramine-HYDROcodone (TUSSIONEX PENNKINETIC ER) 10-8 MG/5ML SUER Take 5 mLs by mouth every 12 (twelve) hours as needed for cough.  . cholecalciferol (VITAMIN D) 1000 units tablet Take 3,000 Units by mouth daily.  . empagliflozin (JARDIANCE) 10 MG TABS tablet Take 10 mg by mouth daily.  . furosemide (LASIX) 40 MG tablet Take 40 mg by mouth daily as needed.  . gabapentin (NEURONTIN) 600 MG tablet Take 1 tablet (600 mg total) by mouth 2 (two) times daily.  . insulin aspart (NOVOLOG) 100 UNIT/ML injection Inject 8 Units into the skin. Bid to Tid before meals  . Insulin Glargine, 1 Unit Dial, (TOUJEO SOLOSTAR) 300 UNIT/ML SOPN Inject 80 Units into the skin daily before breakfast.  . losartan (COZAAR) 100 MG tablet Take 1 tablet (100 mg total) by mouth daily.  . metoprolol succinate (TOPROL-XL) 25 MG 24 hr tablet Take 1 tablet (25 mg total) by mouth daily.  . pravastatin (PRAVACHOL) 40 MG tablet Take 1 tablet (40 mg total) by mouth daily.  . tamsulosin (FLOMAX) 0.4 MG CAPS capsule Take 1 capsule (0.4 mg total) by mouth daily after supper.   Past  Medical History:  Diagnosis Date  . Bursitis of left shoulder   . Cervical disc disease   . Diabetes mellitus, type 2 ([place])   . Diverticulosis of colon   . Erectile dysfunction   . Exogenous obesity   . History of anemia   . Hyperlipidemia   . Hypertension   . Internal hemorrhoids   . OSA (obstructive sleep apnea)    cpap  . Testicular hypofunction    Past Surgical History:  Procedure Laterality Date  . COLONOSCOPY    . POSTERIOR LAMINECTOMY / DECOMPRESSION CERVICAL SPINE     c5-6 fusion 12/08 Dr. Consuello Masse  . s/p cervical disc surgery and fusion   10/09   Dr. Consuello Masse   Social History   Social History Narrative   Lives with wife and son in a 2 story home.  Has 1 son.     Retired.  [employer].  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   Occasional alcohol former smoker no drug use   family history includes Breast cancer in his mother; Diabetes in his paternal grandmother; Healthy in his son; Heart disease in his father.   Review of Systems See HPI.  All other review of systems appears negative at this time.  Weight: 278 lb (126.1 kg)  Patient reports

## 2018-09-05 NOTE — Patient Instructions (Signed)
It was good to see and speak to you to resume today [name] [name].  As we discussed I think you are having problems with hemorrhoids and I believe I can fix that for you with the hemorrhoid banding.   That requires an in office evaluation which we will set up for May.   Please check out the [facility] hemorrhoid banding website and you can watch some videos (cartoons thank goodness) on how this procedure works.  I appreciate the opportunity to care for you.  Gatha Mayer, MD, [facility]

## 2018-09-11 ENCOUNTER — Encounter: Payer: Self-pay | Admitting: Internal Medicine

## 2018-09-11 ENCOUNTER — Ambulatory Visit (INDEPENDENT_AMBULATORY_CARE_PROVIDER_SITE_OTHER): Payer: PPO | Admitting: Internal Medicine

## 2018-09-11 ENCOUNTER — Other Ambulatory Visit: Payer: Self-pay

## 2018-09-11 DIAGNOSIS — K648 Other hemorrhoids: Secondary | ICD-10-CM | POA: Diagnosis not present

## 2018-09-11 HISTORY — DX: Other hemorrhoids: K64.8

## 2018-09-11 NOTE — Progress Notes (Signed)
   Hemorrhoid ligation procedure note  Signs and symptoms are fecal smearing H  Last colonoscopy 2014 for screening and it was negative  Digital rectal exam is normal without mass formed brown stool is present is nontender with normal resting sphincter tone  Anoscopic examination reveals grade 2 internal hemorrhoids in all 3 positions mildly inflamed   PROCEDURE NOTE: The patient presents with symptomatic grade 2  hemorrhoids, requesting rubber band ligation of his/her hemorrhoidal disease.  All risks, benefits and alternative forms of therapy were described and informed consent was obtained.   The anorectum was pre-medicated with 0.125% NTG and 5% lidocaine The decision was made to band all 3  internal hemorrhoids, and the [place] was used to perform band ligation without complication.  Digital anorectal examination was then performed to assure proper positioning of the band, and to adjust the banded tissue as required.  The patient was discharged home without pain or other issues.  Dietary and behavioral recommendations were given and along with follow-up instructions.       The patient will contact me in 6-8 weeks to update symptoms and see if he needs follow-up and possible additional banding as required. No complications were encountered and the patient tolerated the procedure well.  UO:[id number], Evie Lacks, MD

## 2018-09-11 NOTE — Assessment & Plan Note (Signed)
Grade 2 all positions All banded today

## 2018-09-11 NOTE — Patient Instructions (Addendum)
HEMORRHOID BANDING PROCEDURE    FOLLOW-UP CARE   1. The procedure you have had should have been relatively painless since the banding of the area involved does not have nerve endings and there is no pain sensation.  The rubber band cuts off the blood supply to the hemorrhoid and the band may fall off as soon as 48 hours after the banding (the band may occasionally be seen in the toilet bowl following a bowel movement). You may notice a temporary feeling of fullness in the rectum which should respond adequately to plain Tylenol or Motrin.  2. Following the banding, avoid strenuous exercise that evening and resume full activity the next day.  A sitz bath (soaking in a warm tub) or bidet is soothing, and can be useful for cleansing the area after bowel movements.     3. To avoid constipation, take two tablespoons of natural wheat bran, natural oat bran, flax, Benefiber or any over the counter fiber supplement and increase your water intake to 7-8 glasses daily.    4. Unless you have been prescribed anorectal medication, do not put anything inside your rectum for two weeks: No suppositories, enemas, fingers, etc.  5. Occasionally, you may have more bleeding than usual after the banding procedure.  This is often from the untreated hemorrhoids rather than the treated one.  Don't be concerned if there is a tablespoon or so of blood.  If there is more blood than this, lie flat with your bottom higher than your head and apply an ice pack to the area. If the bleeding does not stop within a half an hour or if you feel faint, call our office at [phone] or go to the emergency room.  6. Problems are not common; however, if there is a substantial amount of bleeding, severe pain, chills, fever or difficulty passing urine (very rare) or other problems, you should call us at [phone] or report to the nearest emergency room.  7. Do not stay seated continuously for more than 2-3 hours for a day or two  after the procedure.  Tighten your buttock muscles 10-15 times every two hours and take 10-15 deep breaths every 1-2 hours.  Do not spend more than a few minutes on the toilet if you cannot empty your bowel; instead re-visit the toilet at a later time.    Please call and update [place] on how your doing in 6-8 weeks.   Your colonoscopy is due in December 2024.     I appreciate the opportunity to care for you. Silvano Rusk, MD, [facility]

## 2018-09-22 ENCOUNTER — Encounter: Payer: PPO | Admitting: Internal Medicine

## 2018-10-10 DIAGNOSIS — Z85820 Personal history of malignant melanoma of skin: Secondary | ICD-10-CM | POA: Diagnosis not present

## 2018-10-10 DIAGNOSIS — L821 Other seborrheic keratosis: Secondary | ICD-10-CM | POA: Diagnosis not present

## 2018-10-10 DIAGNOSIS — L570 Actinic keratosis: Secondary | ICD-10-CM | POA: Diagnosis not present

## 2018-10-10 DIAGNOSIS — D229 Melanocytic nevi, unspecified: Secondary | ICD-10-CM | POA: Diagnosis not present

## 2018-10-10 DIAGNOSIS — Z85828 Personal history of other malignant neoplasm of skin: Secondary | ICD-10-CM | POA: Diagnosis not present

## 2018-10-10 DIAGNOSIS — L819 Disorder of pigmentation, unspecified: Secondary | ICD-10-CM | POA: Diagnosis not present

## 2018-10-10 DIAGNOSIS — L814 Other melanin hyperpigmentation: Secondary | ICD-10-CM | POA: Diagnosis not present

## 2018-10-10 DIAGNOSIS — D044 Carcinoma in situ of skin of scalp and neck: Secondary | ICD-10-CM | POA: Diagnosis not present

## 2018-10-10 DIAGNOSIS — D485 Neoplasm of uncertain behavior of skin: Secondary | ICD-10-CM | POA: Diagnosis not present

## 2018-10-19 DIAGNOSIS — G4733 Obstructive sleep apnea (adult) (pediatric): Secondary | ICD-10-CM | POA: Diagnosis not present

## 2018-10-22 ENCOUNTER — Other Ambulatory Visit: Payer: Self-pay | Admitting: Neurology

## 2018-12-29 ENCOUNTER — Telehealth: Payer: Self-pay | Admitting: Neurology

## 2018-12-29 ENCOUNTER — Encounter: Payer: Self-pay | Admitting: Neurology

## 2018-12-29 ENCOUNTER — Ambulatory Visit: Payer: PPO | Admitting: Neurology

## 2018-12-29 NOTE — Telephone Encounter (Signed)
Patient left msg with after hours that he was needing to speak with [facility]. Thanks!

## 2019-01-02 NOTE — Progress Notes (Signed)
Follow-up Visit   Date: 01/03/19    Giorgio Nail MRN: [id number] DOB: 1951/05/07   Interim History: Arshia Egnew is a 68 y.o. right-handed Caucasian male with hypertension, hyperlipidemia, insulin-dependent diabetes mellitus type 2, OSA, s/p cervical decompression and fusion at C5-6 returning to the clinic for follow-up of arm paresthesias.  The patient was accompanied to the clinic by self.  History of present illness: End of January 2017, he began noticing right hip pain and was given prednisone which helped.  He travelled to [place] and when he awoke in the morning at the hotel, he developed severe and sharp left upper arm and forearm pain. He has no sensation of his index and left middle finger on the left.  Pain is much worse when he is supine and has found that raising his arm provides some relief.  He has noticed increased weakness of his left hand and often drops objects.  He has tried prednisone, gabapentin, and NSAIDs which helps some, but does not completely alleviate the pain.   He endorses suffering a mechanical fall in December 2016, there was no loss of consciousness or neck pain, but is concern that this may have contributed to his new arm pain.   He has history of cervical decompression and fusion x 2 previously for bilateral upper extremity pain and paresthesias.   MRI cervical spine in 2017 shows moderate canal stenosis at C2-3 and C4-5.  He completed PT which helped with left shoulder ROM, no benefit with paresthesias.  Upon further questioning, he recalls having tingling and numbness of the fingers prior to his cervical decompression and that symptoms have not improved.    NCS/EMG of the arms (06/2016) showed bilateral CTS (very mild on the left), mild bilateral ulnar neuropathy and chronic left C6 radiculopathy.    UPDATE 01/02/2019:  He is here for follow-up visit.  He continues to have ongoing pain in the upper arms, described as piercing and sharp, which is worse  with certain positions, such as raising left arm. Pain is not constant and can be alleviated with repositioning the arm.  He denies any weakness but does have reduced range of motion around the shoulder.  He has not been evaluated for shoulder pathology.  There is no change in the numbness/tingling of the fingertips tip.  He endorses difficulty with fine motor tasks and often drops objects.  Sometimes, he wakes up with his arms falling asleep.  Prior electrodiagnostic testing did show carpal tunnel syndrome, he is not wearing wrist splints.   Medications:  Current Outpatient Medications on File Prior to Visit  Medication Sig Dispense Refill  . amLODipine (NORVASC) 5 MG tablet Take 1 tablet (5 mg total) by mouth daily. 90 tablet 3  . aspirin 81 MG tablet Take 81 mg by mouth daily.      . B-D ULTRAFINE III SHORT PEN 31G X 8 MM MISC USE AS DIRECTED 200 each 3  . chlorpheniramine-HYDROcodone (TUSSIONEX PENNKINETIC ER) 10-8 MG/5ML SUER Take 5 mLs by mouth every 12 (twelve) hours as needed for cough. 115 mL 0  . cholecalciferol (VITAMIN D) 1000 units tablet Take 3,000 Units by mouth daily.    . empagliflozin (JARDIANCE) 10 MG TABS tablet Take 10 mg by mouth daily. 90 tablet 3  . furosemide (LASIX) 40 MG tablet Take 40 mg by mouth daily as needed.    . insulin aspart (NOVOLOG) 100 UNIT/ML injection Inject 8 Units into the skin. Bid to Tid before meals    .  Insulin Glargine, 1 Unit Dial, (TOUJEO SOLOSTAR) 300 UNIT/ML SOPN Inject 80 Units into the skin daily before breakfast.    . losartan (COZAAR) 100 MG tablet Take 1 tablet (100 mg total) by mouth daily. 90 tablet 3  . metoprolol succinate (TOPROL-XL) 25 MG 24 hr tablet Take 1 tablet (25 mg total) by mouth daily. 90 tablet 3  . pravastatin (PRAVACHOL) 40 MG tablet Take 1 tablet (40 mg total) by mouth daily. 90 tablet 3  . tamsulosin (FLOMAX) 0.4 MG CAPS capsule Take 1 capsule (0.4 mg total) by mouth daily after supper. 90 capsule 3   No current  facility-administered medications on file prior to visit.     Allergies:  Allergies  Allergen Reactions  . Amoxicillin Nausea And Vomiting  . Lisinopril     Cough   . Penicillins     Per pt: unknown  . Simvastatin     myalgia    Review of Systems:  CONSTITUTIONAL: No fevers, chills, night sweats, or weight loss.  EYES: No visual changes or eye pain ENT: No hearing changes.  No history of nose bleeds.   RESPIRATORY: No cough, wheezing and shortness of breath.   CARDIOVASCULAR: Negative for chest pain, and palpitations.   GI: Negative for abdominal discomfort, blood in stools or black stools.  No recent change in bowel habits.   GU:  No history of incontinence.   MUSCLOSKELETAL: +history of joint pain or swelling.  No myalgias.   SKIN: Negative for lesions, rash, and itching.   ENDOCRINE: Negative for cold or heat intolerance, polydipsia or goiter.   PSYCH:  No depression or anxiety symptoms.   NEURO: As Above.   Vital Signs:  BP 136/78   Pulse 78   Ht 5\' 10"  (1.778 m)   Wt 269 lb (122 kg)   SpO2 99%   BMI 38.60 kg/m   General Medical Exam:   General:  Well appearing, comfortable  Eyes/ENT: see cranial nerve examination.   Neck: No masses appreciated.  Full range of motion without tenderness.  No carotid bruits. Respiratory:  Clear to auscultation, good air entry bilaterally.   Cardiac:  Regular rate and rhythm, no murmur.   Ext:  No edema  Neurological Exam: MENTAL STATUS including orientation to time, place, person, recent and remote memory, attention span and concentration, language, and fund of knowledge is normal.  Speech is not dysarthric.  CRANIAL NERVES:   Pupils equal round and reactive to light.  Normal conjugate, extra-ocular eye movements in all directions of gaze.  No ptosis. Face is symmetric.   MOTOR:  Motor strength is 5/5 in all extremities.  He has pain left arm extension and there is reduced range of motion of the shoulder with extension.  No  atrophy, fasciculations or abnormal movements.  No pronator drift.  Tone is normal.    MSRs:  Reflexes are 2+/4 throughout  SENSORY:  Intact to temperature, pin prick, and vibration in the arms.  Vibration is trace distal to the ankles bilaterally.  COORDINATION/GAIT:  Gait wide-based and stable.    Data: MRI cervical spine wo contrast 10/02/2015 performed at [facility]:  1. Post-operative changes at C5-6.  No obvious acute or chronic complication. 2. Moderate central stenosis at C2-3 due to posterior soft tissue hypertrophy.  Less severe central stenosis at C4-5 3. Small disc bulge protrusion at T1-2.  NCS/EMG 06/25/2016: 1. Bilateral ulnar neuropathy with slowing across the elbow, purely demyelinating in type. 2. Bilateral median neuropathy at the wrist, consistent  with the clinical diagnosis of carpal tunnel syndrome. Overall, these findings are moderate in degree electrically on the right and very mild on the left. 3. Chronic C6 radiculopathy affecting the left upper extremity; moderate in degree electrically  IMPRESSION: 1.  Cervical spondylosis s/p decompression and fusion at C5-6 with residual arm paresthesias.  Given that symptoms have not improved over the past 3 years, these sensory deficits are most likely permanent.  MRI cervical spine 2017 shows moderate canal stenosis at C2-3 and C4-5.  He completed neck PT without any benefit and is not interested in seeing a spine specialist.  -Continue gabapentin 600 mg twice daily.  He has been on the same dose for a number of years and may request future refills through his PCPs office, if that is easier and more convenient.  2.  Left arm pain may be more orthopedic in nature.  I have asked him to follow-up with his PCP and consider referral to see orthopedics to be sure shoulder pathology has been evaluated for.   3.  Bilateral carpal tunnel syndrome worse on the left.  -Recommend wearing wrist splints at bedtime, referral will be made to  [facility]  4.  Diabetic neuropathy affecting the feet, mild  -Fall precautions discussed, recommend daily foot inspection.  He had many questions about his arm symptoms which I addressed to the best of my ability.  There may certainly be an overlap of double crush involving spinal canal stenosis and entrapment neuropathy, and possibly even musculoskeletal pathology.  Referral for steroid injection to the neck or wrists to help delineate whether the pain can be stemming from his cervical region or carpal tunnel syndrome was declined.   Return to clinic as needed  Greater than 50% of this 25 minute visit was spent in counseling, explanation of diagnosis, planning of further management, and coordination of care   Thank you for allowing me to participate in patient's care.  If I can answer any additional questions, I would be pleased to do so.    Sincerely,     K. Posey Pronto, DO

## 2019-01-03 ENCOUNTER — Ambulatory Visit: Payer: PPO | Admitting: Neurology

## 2019-01-03 ENCOUNTER — Other Ambulatory Visit: Payer: Self-pay

## 2019-01-03 ENCOUNTER — Encounter: Payer: PPO | Admitting: Internal Medicine

## 2019-01-03 ENCOUNTER — Encounter: Payer: Self-pay | Admitting: Neurology

## 2019-01-03 VITALS — BP 136/78 | HR 78 | Ht 70.0 in | Wt 269.0 lb

## 2019-01-03 DIAGNOSIS — G5601 Carpal tunnel syndrome, right upper limb: Secondary | ICD-10-CM | POA: Diagnosis not present

## 2019-01-03 DIAGNOSIS — M4802 Spinal stenosis, cervical region: Secondary | ICD-10-CM | POA: Diagnosis not present

## 2019-01-03 DIAGNOSIS — G5603 Carpal tunnel syndrome, bilateral upper limbs: Secondary | ICD-10-CM

## 2019-01-03 DIAGNOSIS — E1142 Type 2 diabetes mellitus with diabetic polyneuropathy: Secondary | ICD-10-CM

## 2019-01-03 DIAGNOSIS — G5602 Carpal tunnel syndrome, left upper limb: Secondary | ICD-10-CM | POA: Diagnosis not present

## 2019-01-03 MED ORDER — GABAPENTIN 600 MG PO TABS: 600.0000 mg | ORAL_TABLET | Freq: Two times a day (BID) | ORAL | 3 refills | Status: DC

## 2019-01-03 NOTE — Patient Instructions (Signed)
Referral to [facility] for bilateral wrist splints  Continue gabapentin 600mg  twice daily  Follow-up with orthopaedic surgery about left shoulder pain

## 2019-01-04 DIAGNOSIS — D485 Neoplasm of uncertain behavior of skin: Secondary | ICD-10-CM | POA: Diagnosis not present

## 2019-01-04 DIAGNOSIS — L88 Pyoderma gangrenosum: Secondary | ICD-10-CM | POA: Diagnosis not present

## 2019-01-08 ENCOUNTER — Ambulatory Visit (INDEPENDENT_AMBULATORY_CARE_PROVIDER_SITE_OTHER): Payer: PPO | Admitting: Internal Medicine

## 2019-01-08 ENCOUNTER — Other Ambulatory Visit: Payer: Self-pay

## 2019-01-08 ENCOUNTER — Encounter: Payer: Self-pay | Admitting: Internal Medicine

## 2019-01-08 VITALS — BP 120/70 | HR 73 | Temp 98.5°F | Ht 70.0 in | Wt 266.0 lb

## 2019-01-08 DIAGNOSIS — Z23 Encounter for immunization: Secondary | ICD-10-CM | POA: Diagnosis not present

## 2019-01-08 DIAGNOSIS — E785 Hyperlipidemia, unspecified: Secondary | ICD-10-CM | POA: Diagnosis not present

## 2019-01-08 DIAGNOSIS — R05 Cough: Secondary | ICD-10-CM

## 2019-01-08 DIAGNOSIS — I10 Essential (primary) hypertension: Secondary | ICD-10-CM

## 2019-01-08 DIAGNOSIS — E1165 Type 2 diabetes mellitus with hyperglycemia: Secondary | ICD-10-CM | POA: Diagnosis not present

## 2019-01-08 DIAGNOSIS — R059 Cough, unspecified: Secondary | ICD-10-CM

## 2019-01-08 DIAGNOSIS — Z794 Long term (current) use of insulin: Secondary | ICD-10-CM | POA: Diagnosis not present

## 2019-01-08 DIAGNOSIS — I2510 Atherosclerotic heart disease of native coronary artery without angina pectoris: Secondary | ICD-10-CM

## 2019-01-08 MED ORDER — JARDIANCE 10 MG PO TABS: 10.0000 mg | ORAL_TABLET | Freq: Every day | ORAL | 3 refills | Status: DC

## 2019-01-08 MED ORDER — PRAVASTATIN SODIUM 40 MG PO TABS: 40.0000 mg | ORAL_TABLET | Freq: Every day | ORAL | 3 refills | Status: DC

## 2019-01-08 MED ORDER — LOSARTAN POTASSIUM 100 MG PO TABS: 100.0000 mg | ORAL_TABLET | Freq: Every day | ORAL | 3 refills | Status: DC

## 2019-01-08 MED ORDER — TAMSULOSIN HCL 0.4 MG PO CAPS: 0.4000 mg | ORAL_CAPSULE | Freq: Every day | ORAL | 3 refills | Status: DC

## 2019-01-08 MED ORDER — METOPROLOL SUCCINATE ER 25 MG PO TB24: 25.0000 mg | ORAL_TABLET | Freq: Every day | ORAL | 3 refills | Status: DC

## 2019-01-08 MED ORDER — AMLODIPINE BESYLATE 5 MG PO TABS: 5.0000 mg | ORAL_TABLET | Freq: Every day | ORAL | 3 refills | Status: DC

## 2019-01-08 MED ORDER — FUROSEMIDE 40 MG PO TABS: 40.0000 mg | ORAL_TABLET | Freq: Every day | ORAL | 1 refills | Status: DC | PRN

## 2019-01-08 MED ORDER — HYDROCOD POLST-CPM POLST ER 10-8 MG/5ML PO SUER: 5.0000 mL | Freq: Two times a day (BID) | ORAL | 0 refills | Status: DC | PRN

## 2019-01-08 NOTE — Assessment & Plan Note (Addendum)
Pravastatin A cardiac CT scan for coronary calcium offered 8/20

## 2019-01-08 NOTE — Patient Instructions (Addendum)
Reduce your consumption of sugars and starches.  Eliminate high fructose corn syrup from your diet.  Reduce your consumption of processed foods.   You can try fasting like Richardson Landry.  For example you can skip breakfast and lunch every other day (24-hour fast)   Stress reduction, good night sleep, relaxation, meditation, yoga and other physical activity is likely to help you to maintain low weight too.   Mediterranean diet is good for you. ([employer] has a typical Mediterranean cuisine menu) The Mediterranean diet is a way of eating based on the traditional cuisine of countries bordering the [employer]. While there is no single definition of the Mediterranean diet, it is typically high in vegetables, fruits, whole grains, beans, nut and seeds, and olive oil. The main components of Mediterranean diet include: Marland Kitchen Daily consumption of vegetables, fruits, whole grains and healthy fats  . Weekly intake of fish, poultry, beans and eggs  . Moderate portions of dairy products  . Limited intake of red meat Other important elements of the Mediterranean diet are sharing meals with family and friends, enjoying a glass of red wine and being physically active. Health benefits of a Mediterranean diet: A traditional Mediterranean diet consisting of large quantities of fresh fruits and vegetables, nuts, fish and olive oil-coupled with physical activity-can reduce your risk of serious mental and physical health problems by: Preventing heart disease and strokes. Following a Mediterranean diet limits your intake of refined breads, processed foods, and red meat, and encourages drinking red wine instead of hard liquor-all factors that can help prevent heart disease and stroke. Keeping you agile. If you're an older adult, the nutrients gained with a Mediterranean diet may reduce your risk of developing muscle weakness and other signs of frailty by about 70 percent. Reducing the risk of Alzheimer's. Research  suggests that the [place] diet may improve cholesterol, blood sugar levels, and overall blood vessel health, which in turn may reduce your risk of Alzheimer's disease or dementia. Halving the risk of Parkinson's disease. The high levels of antioxidants in the Mediterranean diet can prevent cells from undergoing a damaging process called oxidative stress, thereby cutting the risk of Parkinson's disease in half. Increasing longevity. By reducing your risk of developing heart disease or cancer with the Mediterranean diet, you're reducing your risk of death at any age by 20%. Protecting against type 2 diabetes. A Mediterranean diet is rich in fiber which digests slowly, prevents huge swings in blood sugar, and can help you maintain a healthy weight.    Cabbage soup recipe that will not make you gain weight: Take 1 small head of cabbage, 1 average pack of celery, 4 green peppers, 4 onions, 2 cans diced tomatoes (they are not available without salt), salt and spices to taste.  Chop cabbage, celery, peppers and onions.  And tomatoes and 2-2.5 liters (2.5 quarts) of water so that it would just cover the vegetables.  Bring to boil.  Add spices and salt.  Turn heat to low/medium and simmer for 20-25 minutes.  Naturally, you can make a smaller batch and change some of the ingredients.   Cardiac CT calcium scoring test $150   Computed tomography, more commonly known as a CT or CAT scan, is a diagnostic medical imaging test. Like traditional x-rays, it produces multiple images or pictures of the inside of the body. The cross-sectional images generated during a CT scan can be reformatted in multiple planes. They can even generate three-dimensional images. These images can be viewed  on a computer monitor, printed on film or by a [place], or transferred to a CD or DVD. CT images of internal organs, bones, soft tissue and blood vessels provide greater detail than traditional x-rays, particularly of soft  tissues and blood vessels. A cardiac CT scan for coronary calcium is a non-invasive way of obtaining information about the presence, location and extent of calcified plaque in the coronary arteries-the vessels that supply oxygen-containing blood to the heart muscle. Calcified plaque results when there is a build-up of fat and other substances under the inner layer of the artery. This material can calcify which signals the presence of atherosclerosis, a disease of the vessel wall, also called coronary artery disease (CAD). People with this disease have an increased risk for heart attacks. In addition, over time, progression of plaque build up (CAD) can narrow the arteries or even close off blood flow to the heart. The result may be chest pain, sometimes called "angina," or a heart attack. Because calcium is a marker of CAD, the amount of calcium detected on a cardiac CT scan is a helpful prognostic tool. The findings on cardiac CT are expressed as a calcium score. Another name for this test is coronary artery calcium scoring.  What are some common uses of the procedure? The goal of cardiac CT scan for calcium scoring is to determine if CAD is present and to what extent, even if there are no symptoms. It is a screening study that may be recommended by a physician for patients with risk factors for CAD but no clinical symptoms. The major risk factors for CAD are: . high blood cholesterol levels  . family history of heart attacks  . diabetes  . high blood pressure  . cigarette smoking  . overweight or obese  . physical inactivity   A negative cardiac CT scan for calcium scoring shows no calcification within the coronary arteries. This suggests that CAD is absent or so minimal it cannot be seen by this technique. The chance of having a heart attack over the next two to five years is very low under these circumstances. A positive test means that CAD is present, regardless of whether or not the patient is  experiencing any symptoms. The amount of calcification-expressed as the calcium score-may help to predict the likelihood of a myocardial infarction (heart attack) in the coming years and helps your medical doctor or cardiologist decide whether the patient may need to take preventive medicine or undertake other measures such as diet and exercise to lower the risk for heart attack. The extent of CAD is graded according to your calcium score:  Calcium Score  Presence of CAD (coronary artery disease)  0 No evidence of CAD   1-10 Minimal evidence of CAD  11-100 Mild evidence of CAD  101-400 Moderate evidence of CAD  Over 400 Extensive evidence of CAD     If you have medicare related insurance (such as traditional Medicare, [employer], [employer], or similar), Please make an appointment at the scheduling desk with Sharee Pimple, the [employer], for your Wellness visit in this office, which is a benefit with your insurance.

## 2019-01-08 NOTE — Assessment & Plan Note (Addendum)
Toprol XL, ASA, Amlodipine, Crestor A cardiac CT scan for coronary calcium offered

## 2019-01-08 NOTE — Assessment & Plan Note (Addendum)
A cardiac CT scan for coronary calcium offered 

## 2019-01-08 NOTE — Progress Notes (Signed)
Subjective:  Patient ID: [name] [name], male    DOB: 1950-08-12  Age: 68 y.o. MRN: [id number]  CC: No chief complaint on file.   HPI Zakry Bonebrake presents for DM, HTN, obesity f/u C/o a large scalp scab - s/p bx 10/10/18 S/p 2 steroid shots  Outpatient Medications Prior to Visit  Medication Sig Dispense Refill  . aspirin 81 MG tablet Take 81 mg by mouth daily.      . B-D ULTRAFINE III SHORT PEN 31G X 8 MM MISC USE AS DIRECTED 200 each 3  . cholecalciferol (VITAMIN D) 1000 units tablet Take 3,000 Units by mouth daily.    Marland Kitchen doxycycline (VIBRAMYCIN) 100 MG capsule Take 1 capsule by mouth 2 (two) times daily.    . empagliflozin (JARDIANCE) 10 MG TABS tablet Take 10 mg by mouth daily. 90 tablet 3  . furosemide (LASIX) 40 MG tablet Take 40 mg by mouth daily as needed.    . gabapentin (NEURONTIN) 600 MG tablet Take 1 tablet (600 mg total) by mouth 2 (two) times daily. 180 tablet 3  . insulin aspart (NOVOLOG) 100 UNIT/ML injection Inject 8 Units into the skin. Bid to Tid before meals    . Insulin Glargine, 1 Unit Dial, (TOUJEO SOLOSTAR) 300 UNIT/ML SOPN Inject 80 Units into the skin daily before breakfast.    . losartan (COZAAR) 100 MG tablet Take 1 tablet (100 mg total) by mouth daily. 90 tablet 3  . metoprolol succinate (TOPROL-XL) 25 MG 24 hr tablet Take 1 tablet (25 mg total) by mouth daily. 90 tablet 3  . pravastatin (PRAVACHOL) 40 MG tablet Take 1 tablet (40 mg total) by mouth daily. 90 tablet 3  . tamsulosin (FLOMAX) 0.4 MG CAPS capsule Take 1 capsule (0.4 mg total) by mouth daily after supper. 90 capsule 3  . amLODipine (NORVASC) 5 MG tablet Take 1 tablet (5 mg total) by mouth daily. 90 tablet 3  . chlorpheniramine-HYDROcodone (TUSSIONEX PENNKINETIC ER) 10-8 MG/5ML SUER Take 5 mLs by mouth every 12 (twelve) hours as needed for cough. (Patient not taking: Reported on 01/08/2019) 115 mL 0   No facility-administered medications prior to visit.     ROS: Review of Systems   Constitutional: Negative for appetite change, fatigue and unexpected weight change.  HENT: Negative for congestion, nosebleeds, sneezing, sore throat and trouble swallowing.   Eyes: Negative for itching and visual disturbance.  Respiratory: Negative for cough.   Cardiovascular: Negative for chest pain, palpitations and leg swelling.  Gastrointestinal: Negative for abdominal distention, blood in stool, diarrhea and nausea.  Genitourinary: Negative for frequency and hematuria.  Musculoskeletal: Positive for arthralgias, back pain and neck stiffness. Negative for gait problem, joint swelling and neck pain.  Skin: Negative for rash.  Neurological: Negative for dizziness, tremors, speech difficulty and weakness.  Psychiatric/Behavioral: Negative for agitation, dysphoric mood, sleep disturbance and suicidal ideas. The patient is not nervous/anxious.     Objective:  BP 120/70 (BP Location: Left Arm, Patient Position: Sitting, Cuff Size: Large)   Pulse 73   Temp 98.5 F (36.9 C) (Oral)   Ht 5\' 10"  (1.778 m)   Wt 266 lb (120.7 kg)   SpO2 96%   BMI 38.17 kg/m   BP Readings from Last 3 Encounters:  01/08/19 120/70  01/03/19 136/78  09/11/18 110/70    Wt Readings from Last 3 Encounters:  01/08/19 266 lb (120.7 kg)  01/03/19 269 lb (122 kg)  09/11/18 267 lb (121.1 kg)    Physical Exam Constitutional:  General: He is not in acute distress.    Appearance: He is well-developed.     Comments: NAD  Eyes:     Conjunctiva/sclera: Conjunctivae normal.     Pupils: Pupils are equal, round, and reactive to light.  Neck:     Musculoskeletal: Normal range of motion.     Thyroid: No thyromegaly.     Vascular: No JVD.  Cardiovascular:     Rate and Rhythm: Normal rate and regular rhythm.     Heart sounds: Normal heart sounds. No murmur. No friction rub. No gallop.   Pulmonary:     Effort: Pulmonary effort is normal. No respiratory distress.     Breath sounds: Normal breath sounds. No  wheezing or rales.  Chest:     Chest wall: No tenderness.  Abdominal:     General: Bowel sounds are normal. There is no distension.     Palpations: Abdomen is soft. There is no mass.     Tenderness: There is no abdominal tenderness. There is no guarding or rebound.  Musculoskeletal: Normal range of motion.        General: No tenderness.  Lymphadenopathy:     Cervical: No cervical adenopathy.  Skin:    General: Skin is warm and dry.     Findings: No rash.  Neurological:     Mental Status: He is alert and oriented to person, place, and time.     Cranial Nerves: No cranial nerve deficit.     Motor: No abnormal muscle tone.     Coordination: Coordination normal.     Gait: Gait abnormal.     Deep Tendon Reflexes: Reflexes are normal and symmetric.  Psychiatric:        Behavior: Behavior normal.        Thought Content: Thought content normal.        Judgment: Judgment normal.   a large scalp scab - s/p bx Obese Declined rectal  Lab Results  Component Value Date   WBC 6.7 07/06/2018   HGB 14.6 07/06/2018   HCT 43.2 07/06/2018   PLT 273.0 07/06/2018   GLUCOSE 96 07/06/2018   CHOL 187 07/06/2018   TRIG 123.0 07/06/2018   HDL 46.00 07/06/2018   LDLDIRECT 200.0 02/17/2016   LDLCALC 117 (H) 07/06/2018   ALT 14 07/06/2018   AST 13 07/06/2018   NA 140 07/06/2018   K 4.5 07/06/2018   CL 103 07/06/2018   CREATININE 1.22 07/06/2018   BUN 24 (H) 07/06/2018   CO2 28 07/06/2018   TSH 3.57 07/06/2018   PSA 0.59 07/06/2018   INR 1.08 02/02/2011   HGBA1C 7.2 (H) 09/28/2017   MICROALBUR 195.1 (H) 02/17/2016    No results found.  Assessment & Plan:   Diagnoses and all orders for this visit:  Need for influenza vaccination -     Flu Vaccine QUAD High Dose(Fluad)  Essential hypertension     No orders of the defined types were placed in this encounter.    Follow-up: No follow-ups on file.  Walker Kehr, MD

## 2019-01-08 NOTE — Assessment & Plan Note (Signed)
Recurrent Tussionex prn - rare use  Potential benefits of opioids use as well as potential risks (i.e. addiction risk, apnea etc) and complications (i.e. Somnolence, constipation and others) were explained to the patient and were aknowledged.

## 2019-01-08 NOTE — Assessment & Plan Note (Addendum)
Steroid inj for a large scalp scab - s/p bx - recent F/u w/Dr Chalmers Cater A cardiac CT scan for coronary calcium offered 8/20

## 2019-01-09 DIAGNOSIS — R809 Proteinuria, unspecified: Secondary | ICD-10-CM | POA: Diagnosis not present

## 2019-01-09 DIAGNOSIS — R609 Edema, unspecified: Secondary | ICD-10-CM | POA: Diagnosis not present

## 2019-01-09 DIAGNOSIS — E7800 Pure hypercholesterolemia, unspecified: Secondary | ICD-10-CM | POA: Diagnosis not present

## 2019-01-09 DIAGNOSIS — I10 Essential (primary) hypertension: Secondary | ICD-10-CM | POA: Diagnosis not present

## 2019-01-09 DIAGNOSIS — E1165 Type 2 diabetes mellitus with hyperglycemia: Secondary | ICD-10-CM | POA: Diagnosis not present

## 2019-01-09 DIAGNOSIS — N390 Urinary tract infection, site not specified: Secondary | ICD-10-CM | POA: Diagnosis not present

## 2019-01-11 ENCOUNTER — Other Ambulatory Visit: Payer: Self-pay | Admitting: Internal Medicine

## 2019-01-11 DIAGNOSIS — L989 Disorder of the skin and subcutaneous tissue, unspecified: Secondary | ICD-10-CM | POA: Diagnosis not present

## 2019-01-11 DIAGNOSIS — D485 Neoplasm of uncertain behavior of skin: Secondary | ICD-10-CM | POA: Diagnosis not present

## 2019-01-11 DIAGNOSIS — E1165 Type 2 diabetes mellitus with hyperglycemia: Secondary | ICD-10-CM

## 2019-01-11 DIAGNOSIS — E785 Hyperlipidemia, unspecified: Secondary | ICD-10-CM

## 2019-01-31 DIAGNOSIS — R809 Proteinuria, unspecified: Secondary | ICD-10-CM | POA: Diagnosis not present

## 2019-01-31 DIAGNOSIS — E1122 Type 2 diabetes mellitus with diabetic chronic kidney disease: Secondary | ICD-10-CM | POA: Diagnosis not present

## 2019-01-31 DIAGNOSIS — I2510 Atherosclerotic heart disease of native coronary artery without angina pectoris: Secondary | ICD-10-CM | POA: Diagnosis not present

## 2019-01-31 DIAGNOSIS — I129 Hypertensive chronic kidney disease with stage 1 through stage 4 chronic kidney disease, or unspecified chronic kidney disease: Secondary | ICD-10-CM | POA: Diagnosis not present

## 2019-01-31 DIAGNOSIS — N179 Acute kidney failure, unspecified: Secondary | ICD-10-CM | POA: Diagnosis not present

## 2019-01-31 DIAGNOSIS — N183 Chronic kidney disease, stage 3 (moderate): Secondary | ICD-10-CM | POA: Diagnosis not present

## 2019-02-05 DIAGNOSIS — R809 Proteinuria, unspecified: Secondary | ICD-10-CM | POA: Diagnosis not present

## 2019-02-08 DIAGNOSIS — R809 Proteinuria, unspecified: Secondary | ICD-10-CM | POA: Diagnosis not present

## 2019-02-08 DIAGNOSIS — E1165 Type 2 diabetes mellitus with hyperglycemia: Secondary | ICD-10-CM | POA: Diagnosis not present

## 2019-02-08 DIAGNOSIS — E7800 Pure hypercholesterolemia, unspecified: Secondary | ICD-10-CM | POA: Diagnosis not present

## 2019-02-08 DIAGNOSIS — I10 Essential (primary) hypertension: Secondary | ICD-10-CM | POA: Diagnosis not present

## 2019-02-08 DIAGNOSIS — N200 Calculus of kidney: Secondary | ICD-10-CM | POA: Diagnosis not present

## 2019-02-08 DIAGNOSIS — R609 Edema, unspecified: Secondary | ICD-10-CM | POA: Diagnosis not present

## 2019-02-09 ENCOUNTER — Other Ambulatory Visit: Payer: Self-pay | Admitting: Nephrology

## 2019-02-09 DIAGNOSIS — N183 Chronic kidney disease, stage 3 unspecified: Secondary | ICD-10-CM

## 2019-02-14 ENCOUNTER — Ambulatory Visit
Admission: RE | Admit: 2019-02-14 | Discharge: 2019-02-14 | Disposition: A | Discharge status: Home / Self Care | Payer: PPO | Source: Ambulatory Visit | Attending: Nephrology | Admitting: Nephrology

## 2019-02-14 DIAGNOSIS — N183 Chronic kidney disease, stage 3 unspecified: Secondary | ICD-10-CM

## 2019-02-14 DIAGNOSIS — N200 Calculus of kidney: Secondary | ICD-10-CM | POA: Diagnosis not present

## 2019-02-19 ENCOUNTER — Emergency Department (HOSPITAL_COMMUNITY): Payer: PPO

## 2019-02-19 ENCOUNTER — Other Ambulatory Visit: Payer: Self-pay

## 2019-02-19 ENCOUNTER — Encounter (HOSPITAL_COMMUNITY): Payer: Self-pay | Admitting: Emergency Medicine

## 2019-02-19 DIAGNOSIS — Z794 Long term (current) use of insulin: Secondary | ICD-10-CM | POA: Diagnosis not present

## 2019-02-19 DIAGNOSIS — I129 Hypertensive chronic kidney disease with stage 1 through stage 4 chronic kidney disease, or unspecified chronic kidney disease: Secondary | ICD-10-CM | POA: Insufficient documentation

## 2019-02-19 DIAGNOSIS — I2510 Atherosclerotic heart disease of native coronary artery without angina pectoris: Secondary | ICD-10-CM | POA: Insufficient documentation

## 2019-02-19 DIAGNOSIS — Z7982 Long term (current) use of aspirin: Secondary | ICD-10-CM | POA: Insufficient documentation

## 2019-02-19 DIAGNOSIS — R1111 Vomiting without nausea: Secondary | ICD-10-CM | POA: Diagnosis not present

## 2019-02-19 DIAGNOSIS — R319 Hematuria, unspecified: Secondary | ICD-10-CM | POA: Diagnosis not present

## 2019-02-19 DIAGNOSIS — R109 Unspecified abdominal pain: Secondary | ICD-10-CM | POA: Diagnosis present

## 2019-02-19 DIAGNOSIS — Z87891 Personal history of nicotine dependence: Secondary | ICD-10-CM | POA: Diagnosis not present

## 2019-02-19 DIAGNOSIS — E1122 Type 2 diabetes mellitus with diabetic chronic kidney disease: Secondary | ICD-10-CM | POA: Diagnosis not present

## 2019-02-19 DIAGNOSIS — Z79899 Other long term (current) drug therapy: Secondary | ICD-10-CM | POA: Insufficient documentation

## 2019-02-19 DIAGNOSIS — N2889 Other specified disorders of kidney and ureter: Secondary | ICD-10-CM | POA: Diagnosis not present

## 2019-02-19 DIAGNOSIS — N189 Chronic kidney disease, unspecified: Secondary | ICD-10-CM | POA: Diagnosis not present

## 2019-02-19 LAB — URINALYSIS, ROUTINE W REFLEX MICROSCOPIC
Bacteria, UA: NONE SEEN
Bilirubin Urine: NEGATIVE
Glucose, UA: >=500 mg/dL — AB
Ketones, ur: 5 mg/dL — AB
Leukocytes,Ua: NEGATIVE
Nitrite: NEGATIVE
Protein, ur: >=300 mg/dL — AB
Specific Gravity, Urine: 1.025 (ref 1.005–1.030)
pH: 5 (ref 5.0–8.0)

## 2019-02-19 LAB — CBC
HCT: 49.7 % (ref 39.0–52.0)
Hemoglobin: 15.6 g/dL (ref 13.0–17.0)
MCH: 27 pg (ref 26.0–34.0)
MCHC: 31.4 g/dL (ref 30.0–36.0)
MCV: 86.1 fL (ref 80.0–100.0)
Platelets: 284 10*3/uL (ref 150–400)
RBC: 5.77 MIL/uL (ref 4.22–5.81)
RDW: 14.6 % (ref 11.5–15.5)
WBC: 11.6 10*3/uL — ABNORMAL HIGH (ref 4.0–10.5)
nRBC: 0 % (ref 0.0–0.2)

## 2019-02-19 LAB — BASIC METABOLIC PANEL
Anion gap: 11 (ref 5–15)
BUN: 34 mg/dL — ABNORMAL HIGH (ref 8–23)
CO2: 22 mmol/L (ref 22–32)
Calcium: 9.1 mg/dL (ref 8.9–10.3)
Chloride: 105 mmol/L (ref 98–111)
Creatinine, Ser: 2.12 mg/dL — ABNORMAL HIGH (ref 0.61–1.24)
GFR calc Af Amer: 36 mL/min — ABNORMAL LOW (ref >60)
GFR calc non Af Amer: 31 mL/min — ABNORMAL LOW (ref >60)
Glucose, Bld: 259 mg/dL — ABNORMAL HIGH (ref 70–99)
Potassium: 4.4 mmol/L (ref 3.5–5.1)
Sodium: 138 mmol/L (ref 135–145)

## 2019-02-19 NOTE — ED Triage Notes (Signed)
Pt complaint of right flank pain with n/v; hx of kidney stones.

## 2019-02-20 ENCOUNTER — Emergency Department (HOSPITAL_COMMUNITY)
Admission: EM | Admit: 2019-02-20 | Discharge: 2019-02-20 | Disposition: A | Discharge status: Home / Self Care | Payer: PPO | Attending: Emergency Medicine | Admitting: Emergency Medicine

## 2019-02-20 DIAGNOSIS — R319 Hematuria, unspecified: Secondary | ICD-10-CM

## 2019-02-20 DIAGNOSIS — N289 Disorder of kidney and ureter, unspecified: Secondary | ICD-10-CM

## 2019-02-20 DIAGNOSIS — R109 Unspecified abdominal pain: Secondary | ICD-10-CM

## 2019-02-20 DIAGNOSIS — N189 Chronic kidney disease, unspecified: Secondary | ICD-10-CM

## 2019-02-20 MED ORDER — ONDANSETRON HCL 4 MG/2ML IJ SOLN
4.0000 mg | Freq: Once | INTRAMUSCULAR | Status: AC
  Administered 2019-02-20: 02:00:00 4 mg via INTRAVENOUS
  Filled 2019-02-20: qty 2

## 2019-02-20 MED ORDER — ONDANSETRON 4 MG PO TBDP: 4.0000 mg | ORAL_TABLET | Freq: Three times a day (TID) | ORAL | 0 refills | Status: DC | PRN

## 2019-02-20 MED ORDER — SODIUM CHLORIDE 0.9 % IV BOLUS
1000.0000 mL | Freq: Once | INTRAVENOUS | Status: AC
  Administered 2019-02-20: 1000 mL via INTRAVENOUS

## 2019-02-20 NOTE — ED Provider Notes (Signed)
[facility] DEPT Provider Note   CSN: [id number] Arrival date & time: 02/19/19  1512     History   Chief Complaint Chief Complaint  Patient presents with   Flank Pain    HPI [name] [name] is a 68 y.o. male.     Patient to the ED with complaint of intermittent right flank pain associated with nausea and vomiting that started yesterday. He has a history of kidney stones and feels current symptoms are the same. No fever. No hematemesis. He reports having CKD with known hematuria and is followed by Dr. Marval Regal. He visited his office on 02/15/19 had had an ultrasound performed for evaluation of pain.   The history is provided by the patient. No language interpreter was used.  Flank Pain Pertinent negatives include no abdominal pain.    Past Medical History:  Diagnosis Date   Bursitis of left shoulder    Cervical disc disease    Diabetes mellitus, type 2 ([place])    Diverticulosis of colon    Erectile dysfunction    Exogenous obesity    History of anemia    Hyperlipidemia    Hypertension    Internal hemorrhoids    Internal hemorrhoids with complication - fecal smearing 09/11/2018   OSA (obstructive sleep apnea)    cpap   Testicular hypofunction     Patient Active Problem List   Diagnosis Date Noted   Internal hemorrhoids with complication - fecal smearing 09/11/2018   Syncope 99991111   Umbilical hernia 0000000   Rectal discharge 11/19/2016   CTS (carpal tunnel syndrome) 11/19/2016   Edema 02/27/2016   Pain, upper extremity 07/25/2015   Constipation 06/26/2015   LVH (left ventricular hypertrophy) 05/21/2015   Pulmonary hypertension due to sleep-disordered breathing ([place]) 05/21/2015   Sciatic nerve pain 12/18/2014   Right ureteral stone 08/24/2014   Right flank pain 08/24/2014   Dizzinesses 07/31/2014   Cough 07/31/2014   Abdominal pain, LLQ 06/23/2014   Kidney stones 06/17/2014   Well adult  exam 11/22/2013   Coronary atherosclerosis of native coronary artery 07/19/2012   Dyspnea 11/09/2010   ANEMIA 02/15/2010   DIVERTICULOSIS OF COLON 02/15/2010   Obstructive sleep apnea 01/22/2010   TESTICULAR HYPOFUNCTION 11/27/2008   ERECTILE DYSFUNCTION, ORGANIC 11/27/2008   Diabetes mellitus type 2, controlled ([place]) 12/14/2007   Obesity, morbid ([place]) 12/14/2007   [place] DISEASE, CERVICAL 07/17/2007   BURSITIS, LEFT SHOULDER 07/17/2007   Dyslipidemia 10/07/2006   Essential hypertension 10/07/2006    Past Surgical History:  Procedure Laterality Date   COLONOSCOPY     POSTERIOR LAMINECTOMY / DECOMPRESSION CERVICAL SPINE     c5-6 fusion 12/08 Dr. Consuello Masse   s/p cervical disc surgery and fusion  10/09   Dr. Consuello Masse        Home Medications    Prior to Admission medications   Medication Sig Start Date End Date Taking? Authorizing Provider  amLODipine (NORVASC) 5 MG tablet Take 1 tablet (5 mg total) by mouth daily. 01/08/19 01/08/20  Plotnikov, Evie Lacks, MD  aspirin 81 MG tablet Take 81 mg by mouth daily.      [provider]  B-D ULTRAFINE III SHORT PEN 31G X 8 MM MISC USE AS DIRECTED 12/29/17   Plotnikov, Evie Lacks, MD  chlorpheniramine-HYDROcodone (TUSSIONEX PENNKINETIC ER) 10-8 MG/5ML SUER Take 5 mLs by mouth every 12 (twelve) hours as needed for cough. 01/08/19   Plotnikov, Evie Lacks, MD  cholecalciferol (VITAMIN D) 1000 units tablet Take 3,000 Units by mouth daily.  [provider]  doxycycline (VIBRAMYCIN) 100 MG capsule Take 1 capsule by mouth 2 (two) times daily. 01/04/19   [provider]  empagliflozin (JARDIANCE) 10 MG TABS tablet Take 10 mg by mouth daily. 01/08/19   Plotnikov, Evie Lacks, MD  furosemide (LASIX) 40 MG tablet Take 1 tablet (40 mg total) by mouth daily as needed. 01/08/19   Plotnikov, Evie Lacks, MD  gabapentin (NEURONTIN) 600 MG tablet Take 1 tablet (600 mg total) by mouth 2 (two) times daily. 01/03/19   Patel, Arvin Collard K, DO    insulin aspart (NOVOLOG) 100 UNIT/ML injection Inject 8 Units into the skin. Bid to Tid before meals    [provider]  Insulin Glargine, 1 Unit Dial, (TOUJEO SOLOSTAR) 300 UNIT/ML SOPN Inject 80 Units into the skin daily before breakfast.    [provider]  losartan (COZAAR) 100 MG tablet Take 1 tablet (100 mg total) by mouth daily. 01/08/19   Plotnikov, Evie Lacks, MD  metoprolol succinate (TOPROL-XL) 25 MG 24 hr tablet Take 1 tablet (25 mg total) by mouth daily. 01/08/19   Plotnikov, Evie Lacks, MD  pravastatin (PRAVACHOL) 40 MG tablet Take 1 tablet (40 mg total) by mouth daily. 01/08/19   Plotnikov, Evie Lacks, MD  tamsulosin (FLOMAX) 0.4 MG CAPS capsule Take 1 capsule (0.4 mg total) by mouth daily after supper. 01/08/19   Plotnikov, Evie Lacks, MD    Family History Family History  Problem Relation Age of Onset   Heart disease Father        Died of MI at age 70   Breast cancer Mother    Diabetes Paternal Grandmother    Healthy Son    Colon cancer Neg Hx     Social History Social History   Tobacco Use   Smoking status: Former Smoker    Quit date: 01/20/1981    Years since quitting: 38.1   Smokeless tobacco: Never Used  Substance Use Topics   Alcohol use: Yes    Alcohol/week: 7.0 standard drinks    Types: 4 Glasses of wine, 3 Standard drinks or equivalent per week    Comment: He drinks 3-4 glasses of wine several times per week.   Drug use: No     Allergies   Amoxicillin, Lisinopril, Penicillins, and Simvastatin   Review of Systems Review of Systems  Constitutional: Negative for fever.  Gastrointestinal: Positive for nausea and vomiting. Negative for abdominal pain.  Genitourinary: Positive for flank pain and hematuria.  Musculoskeletal: Negative for myalgias.  Neurological: Negative for weakness.     Physical Exam Updated Vital Signs BP (!) 195/87    Pulse 79    Temp 98.4 F (36.9 C) (Oral)    Resp 20    Ht 5\' 9"  (1.753 m)    Wt 122.9 kg     SpO2 100%    BMI 40.02 kg/m   Physical Exam Vitals signs and nursing note reviewed.  Cardiovascular:     Rate and Rhythm: Normal rate.  Pulmonary:     Effort: Pulmonary effort is normal.  Abdominal:     General: There is no distension.     Tenderness: There is no abdominal tenderness. There is no guarding.  Genitourinary:    Comments: No right flank tenderness.      ED Treatments / Results  Labs (all labs ordered are listed, but only abnormal results are displayed) Labs Reviewed  URINALYSIS, ROUTINE W REFLEX MICROSCOPIC - Abnormal; Notable for the following components:  Result Value   Glucose, UA >=500 (*)    Hgb urine dipstick MODERATE (*)    Ketones, ur 5 (*)    Protein, ur >=300 (*)    All other components within normal limits  BASIC METABOLIC PANEL - Abnormal; Notable for the following components:   Glucose, Bld 259 (*)    BUN 34 (*)    Creatinine, Ser 2.12 (*)    GFR calc non Af Amer 31 (*)    GFR calc Af Amer 36 (*)    All other components within normal limits  CBC - Abnormal; Notable for the following components:   WBC 11.6 (*)    All other components within normal limits  URINE CULTURE    EKG None  Radiology Ct Renal Stone Study  Result Date: 02/19/2019 CLINICAL DATA:  68 year old male with flank pain on the right, nausea vomiting, hematuria. EXAM: CT ABDOMEN AND PELVIS WITHOUT CONTRAST TECHNIQUE: Multidetector CT imaging of the abdomen and pelvis was performed following the standard protocol without IV contrast. COMPARISON:  CT Abdomen and Pelvis 08/24/2014. FINDINGS: Lower chest: Stable cardiomegaly. No pericardial effusion. Chronic lower lobe atelectasis or scarring is stable. No pleural effusion. Hepatobiliary: Tiny calcified granuloma in the right hepatic lobe on series 2, image 15 is stable. Otherwise negative noncontrast liver and gallbladder. Pancreas: Negative. Spleen: Negative. Adrenals/Urinary Tract: Normal adrenal glands. Increasing left  nephrolithiasis with an 11 millimeter midpole calculus. Scattered additional 2-3 millimeter left renal calculi. No left hydronephrosis. Normal left ureter. Punctate to 2 millimeter renal calculi (coronal images 98 and 110). No right hydronephrosis. There is a small hyperdense exophytic right renal lesion from the lower pole measuring about 18 millimeters which has increased since 2016 on series 2, image 42. Normal right ureter to the bladder. Unremarkable urinary bladder. Bilateral perinephric stranding is stable since 2016. Stomach/Bowel: No large bowel inflammation. Normal appendix on coronal image 80. Occasional large bowel diverticula. Negative terminal ileum. No dilated small bowel. Negative stomach. No free air or free fluid. Small fat containing umbilical hernia is stable. Vascular/Lymphatic: Vascular patency is not evaluated in the absence of IV contrast. Aortoiliac calcified atherosclerosis. No lymphadenopathy. Reproductive: Negative. Other: No pelvic free fluid. Musculoskeletal: Chronic anterior lower rib fractures. No acute osseous abnormality identified. IMPRESSION: 1. Left greater than right nephrolithiasis but no obstructive uropathy. 2. There is a small indeterminate hyperdense right renal lower pole exophytic lesion which is new or increased since 2016 and meets criteria for follow-up Renal Protocol CT or MRI (without and with contrast, MRI preferred). This recommendation follows ACR consensus guidelines: Management of the Incidental Renal Mass on CT: A White Paper of the ACR Incidental Findings Committee. J Am Coll Radiol [phone]. 3. Otherwise no acute or inflammatory process identified in the non-contrast abdomen or pelvis. Normal appendix. 4. Aortic Atherosclerosis (ICD10-I70.0). Electronically Signed   By: Genevie Ann M.D.   On: 02/19/2019 19:25    Procedures Procedures (including critical care time)  Medications Ordered in ED Medications  sodium chloride 0.9 % bolus 1,000 mL (1,000  mLs Intravenous New Bag/Given 02/20/19 0219)  ondansetron (ZOFRAN) injection 4 mg (4 mg Intravenous Given 02/20/19 0221)     Initial Impression / Assessment and Plan / ED Course  I have reviewed the triage vital signs and the nursing notes.  Pertinent labs & imaging results that were available during my care of the patient were reviewed by me and considered in my medical decision making (see chart for details).  Patient to ED with right flank pain, nausea and vomiting x 2 days without fever.   He is evaluated after a long waiting period due to census. He reports his pain has subsided. No further nausea. He feels he is dehydrated secondary to no PO intake and vomiting. IVF's provided along with Zofran.  He is eating and drinking without recurrent symptoms on multiple rechecks. IVF's completed.   CT scan does not show ureterolithiasis. There is a finding of a hyperdense exophytic lesion in the right renal lower pole, new or increased since 2016. Follow up MRI recommended. On chart review this findings are also seen on the US performed by Dr. Marval Regal. The patient has been made aware of these results and the outpatient follow up recommendation.   No further pain or vomiting in the ED. He is considered appropriate for discharge home.   Final Clinical Impressions(s) / ED Diagnoses   Final diagnoses:  None   1. Hematuria 2. Right flank pain, resolved 3. Abnormal renal lesion, CT 4. Chronic kidney disease  ED Discharge Orders    None       Charlann Lange, PA-C 02/20/19 0444    Merryl Hacker, MD 02/20/19 [phone]

## 2019-02-20 NOTE — Discharge Instructions (Signed)
Your evaluation tonight did not identify any reason for your recent flank pain. It does show your chronic kidney disease, and blood in the urine. There is a lesion on the right kidney, as discussed, that will need further outpatient evaluation and you should follow up with Dr. Marval Regal for this in the next 1-2 weeks.   Return to the emergency department with any new or worsening symptoms. Continue Norco for pain. Zofran has been provided for better control of nausea.

## 2019-02-21 LAB — URINE CULTURE: Culture: NO GROWTH

## 2019-02-22 DIAGNOSIS — L819 Disorder of pigmentation, unspecified: Secondary | ICD-10-CM | POA: Diagnosis not present

## 2019-02-22 DIAGNOSIS — Z85820 Personal history of malignant melanoma of skin: Secondary | ICD-10-CM | POA: Diagnosis not present

## 2019-02-22 DIAGNOSIS — Z85828 Personal history of other malignant neoplasm of skin: Secondary | ICD-10-CM | POA: Diagnosis not present

## 2019-02-22 DIAGNOSIS — L309 Dermatitis, unspecified: Secondary | ICD-10-CM | POA: Diagnosis not present

## 2019-02-22 DIAGNOSIS — D1801 Hemangioma of skin and subcutaneous tissue: Secondary | ICD-10-CM | POA: Diagnosis not present

## 2019-02-22 DIAGNOSIS — L821 Other seborrheic keratosis: Secondary | ICD-10-CM | POA: Diagnosis not present

## 2019-02-22 DIAGNOSIS — L814 Other melanin hyperpigmentation: Secondary | ICD-10-CM | POA: Diagnosis not present

## 2019-02-22 DIAGNOSIS — L570 Actinic keratosis: Secondary | ICD-10-CM | POA: Diagnosis not present

## 2019-02-22 DIAGNOSIS — D229 Melanocytic nevi, unspecified: Secondary | ICD-10-CM | POA: Diagnosis not present

## 2019-03-08 ENCOUNTER — Other Ambulatory Visit: Payer: PPO

## 2019-03-16 ENCOUNTER — Other Ambulatory Visit: Payer: Self-pay | Admitting: Urology

## 2019-03-16 DIAGNOSIS — G4733 Obstructive sleep apnea (adult) (pediatric): Secondary | ICD-10-CM | POA: Diagnosis not present

## 2019-03-16 DIAGNOSIS — D49511 Neoplasm of unspecified behavior of right kidney: Secondary | ICD-10-CM | POA: Diagnosis not present

## 2019-03-16 DIAGNOSIS — N5201 Erectile dysfunction due to arterial insufficiency: Secondary | ICD-10-CM | POA: Diagnosis not present

## 2019-03-16 DIAGNOSIS — N200 Calculus of kidney: Secondary | ICD-10-CM | POA: Diagnosis not present

## 2019-03-19 DIAGNOSIS — L905 Scar conditions and fibrosis of skin: Secondary | ICD-10-CM | POA: Diagnosis not present

## 2019-03-19 DIAGNOSIS — Z85828 Personal history of other malignant neoplasm of skin: Secondary | ICD-10-CM | POA: Diagnosis not present

## 2019-03-19 DIAGNOSIS — L570 Actinic keratosis: Secondary | ICD-10-CM | POA: Diagnosis not present

## 2019-04-15 DIAGNOSIS — G4733 Obstructive sleep apnea (adult) (pediatric): Secondary | ICD-10-CM | POA: Diagnosis not present

## 2019-04-16 ENCOUNTER — Other Ambulatory Visit: Payer: Self-pay

## 2019-04-16 ENCOUNTER — Ambulatory Visit
Admission: RE | Admit: 2019-04-16 | Discharge: 2019-04-16 | Disposition: A | Discharge status: Home / Self Care | Payer: PPO | Source: Ambulatory Visit | Attending: Urology | Admitting: Urology

## 2019-04-16 DIAGNOSIS — D49511 Neoplasm of unspecified behavior of right kidney: Secondary | ICD-10-CM

## 2019-04-16 DIAGNOSIS — N2889 Other specified disorders of kidney and ureter: Secondary | ICD-10-CM | POA: Diagnosis not present

## 2019-04-16 MED ORDER — GADOBENATE DIMEGLUMINE 529 MG/ML IV SOLN
20.0000 mL | Freq: Once | INTRAVENOUS | Status: AC | PRN
  Administered 2019-04-16: 20 mL via INTRAVENOUS

## 2019-04-19 DIAGNOSIS — N401 Enlarged prostate with lower urinary tract symptoms: Secondary | ICD-10-CM | POA: Diagnosis not present

## 2019-04-19 DIAGNOSIS — D49511 Neoplasm of unspecified behavior of right kidney: Secondary | ICD-10-CM | POA: Diagnosis not present

## 2019-04-19 DIAGNOSIS — R351 Nocturia: Secondary | ICD-10-CM | POA: Diagnosis not present

## 2019-04-19 DIAGNOSIS — R3121 Asymptomatic microscopic hematuria: Secondary | ICD-10-CM | POA: Diagnosis not present

## 2019-05-02 DIAGNOSIS — G4733 Obstructive sleep apnea (adult) (pediatric): Secondary | ICD-10-CM | POA: Diagnosis not present

## 2019-05-28 DIAGNOSIS — U071 COVID-19: Secondary | ICD-10-CM | POA: Diagnosis not present

## 2019-05-30 ENCOUNTER — Telehealth: Payer: PPO

## 2019-05-30 ENCOUNTER — Telehealth: Payer: Self-pay

## 2019-05-30 NOTE — Telephone Encounter (Signed)
New for 2021  [facility]  the patient request delete [facility].

## 2019-05-31 ENCOUNTER — Ambulatory Visit (INDEPENDENT_AMBULATORY_CARE_PROVIDER_SITE_OTHER): Payer: Medicare Other | Admitting: Internal Medicine

## 2019-05-31 DIAGNOSIS — R059 Cough, unspecified: Secondary | ICD-10-CM

## 2019-05-31 DIAGNOSIS — U071 COVID-19: Secondary | ICD-10-CM | POA: Diagnosis not present

## 2019-05-31 DIAGNOSIS — R05 Cough: Secondary | ICD-10-CM | POA: Diagnosis not present

## 2019-05-31 MED ORDER — HYDROCOD POLST-CPM POLST ER 10-8 MG/5ML PO SUER: 5.0000 mL | Freq: Two times a day (BID) | ORAL | 0 refills | Status: DC | PRN

## 2019-05-31 MED ORDER — AZITHROMYCIN 250 MG PO TABS: ORAL_TABLET | ORAL | 0 refills | Status: DC

## 2019-05-31 NOTE — Assessment & Plan Note (Signed)
Zpac Tussionex Zinc

## 2019-05-31 NOTE — Progress Notes (Signed)
Virtual Visit via Video Note  I connected with [name] [name] on 05/31/19 at  3:00 PM EST by a video enabled telemedicine application and verified that I am speaking with the correct person using two identifiers.   I discussed the limitations of evaluation and management by telemedicine and the availability of in person appointments. The patient expressed understanding and agreed to proceed.  History of Present Illness: We need to follow-up on  There has been no runny nose, cough, chest pain, shortness of breath, abdominal pain, diarrhea, constipation, arthralgias, skin rashes.   Observations/Objective: The patient appears to be in no acute distress, looks well.  Assessment and Plan:  See my Assessment and Plan. Follow Up Instructions:    I discussed the assessment and treatment plan with the patient. The patient was provided an opportunity to ask questions and all were answered. The patient agreed with the plan and demonstrated an understanding of the instructions.   The patient was advised to call back or seek an in-person evaluation if the symptoms worsen or if the condition fails to improve as anticipated.  I provided face-to-face time during this encounter. We were at different locations.   Walker Kehr, MD

## 2019-06-03 ENCOUNTER — Encounter: Payer: Self-pay | Admitting: Internal Medicine

## 2019-06-03 NOTE — Assessment & Plan Note (Signed)
Tussionex as needed

## 2019-06-19 ENCOUNTER — Ambulatory Visit: Payer: Medicare Other | Attending: Internal Medicine

## 2019-06-19 DIAGNOSIS — Z23 Encounter for immunization: Secondary | ICD-10-CM | POA: Insufficient documentation

## 2019-06-19 NOTE — Progress Notes (Signed)
   Covid-19 Vaccination Clinic  Name:  Levante Lukaszewicz    MRN: [id number] DOB: 24-Jun-1950  06/19/2019  Mr. Barrueta was observed post Covid-19 immunization for 30 minutes based on pre-vaccination screening without incidence. He was provided with Vaccine Information Sheet and instruction to access the V-Safe system.   Mr. Lichtenstein was instructed to call 911 with any severe reactions post vaccine: Marland Kitchen Difficulty breathing  . Swelling of your face and throat  . A fast heartbeat  . A bad rash all over your body  . Dizziness and weakness    Immunizations Administered    Name Date Dose VIS Date Route   Pfizer COVID-19 Vaccine 06/19/2019  8:46 AM 0.3 mL 04/20/2019 Intramuscular   Manufacturer: [place]   Lot: [id number]   [place]: [id number]

## 2019-06-21 ENCOUNTER — Ambulatory Visit: Payer: Medicare Other

## 2019-07-09 ENCOUNTER — Ambulatory Visit: Payer: PPO | Admitting: Internal Medicine

## 2019-07-14 ENCOUNTER — Ambulatory Visit: Payer: Medicare Other | Attending: Internal Medicine

## 2019-07-14 DIAGNOSIS — Z23 Encounter for immunization: Secondary | ICD-10-CM | POA: Insufficient documentation

## 2019-07-14 NOTE — Progress Notes (Signed)
   Covid-19 Vaccination Clinic  Name:  [name] [name]    MRN: [id number] DOB: Jun 11, 1950  07/14/2019  [name] was observed post Covid-19 immunization for 30 minutes based on pre-vaccination screening without incident. He was provided with Vaccine Information Sheet and instruction to access the V-Safe system.   [name] was instructed to call 911 with any severe reactions post vaccine: Marland Kitchen Difficulty breathing  . Swelling of face and throat  . A fast heartbeat  . A bad rash all over body  . Dizziness and weakness   Immunizations Administered    Name Date Dose VIS Date Route   Pfizer COVID-19 Vaccine 07/14/2019  8:17 AM 0.3 mL 04/20/2019 Intramuscular   Manufacturer: [place]   Lot: [id number]   [place]: [id number]

## 2019-07-23 DIAGNOSIS — E7800 Pure hypercholesterolemia, unspecified: Secondary | ICD-10-CM | POA: Diagnosis not present

## 2019-07-23 DIAGNOSIS — I10 Essential (primary) hypertension: Secondary | ICD-10-CM | POA: Diagnosis not present

## 2019-07-23 DIAGNOSIS — E1165 Type 2 diabetes mellitus with hyperglycemia: Secondary | ICD-10-CM | POA: Diagnosis not present

## 2019-07-30 DIAGNOSIS — I10 Essential (primary) hypertension: Secondary | ICD-10-CM | POA: Diagnosis not present

## 2019-07-30 DIAGNOSIS — R609 Edema, unspecified: Secondary | ICD-10-CM | POA: Diagnosis not present

## 2019-07-30 DIAGNOSIS — E7800 Pure hypercholesterolemia, unspecified: Secondary | ICD-10-CM | POA: Diagnosis not present

## 2019-07-30 DIAGNOSIS — E1165 Type 2 diabetes mellitus with hyperglycemia: Secondary | ICD-10-CM | POA: Diagnosis not present

## 2019-08-09 DIAGNOSIS — E1122 Type 2 diabetes mellitus with diabetic chronic kidney disease: Secondary | ICD-10-CM | POA: Diagnosis not present

## 2019-08-09 DIAGNOSIS — N183 Chronic kidney disease, stage 3 unspecified: Secondary | ICD-10-CM | POA: Diagnosis not present

## 2019-08-09 DIAGNOSIS — R809 Proteinuria, unspecified: Secondary | ICD-10-CM | POA: Diagnosis not present

## 2019-08-09 DIAGNOSIS — I129 Hypertensive chronic kidney disease with stage 1 through stage 4 chronic kidney disease, or unspecified chronic kidney disease: Secondary | ICD-10-CM | POA: Diagnosis not present

## 2019-08-28 ENCOUNTER — Ambulatory Visit (INDEPENDENT_AMBULATORY_CARE_PROVIDER_SITE_OTHER): Payer: Medicare Other | Admitting: Internal Medicine

## 2019-08-28 ENCOUNTER — Other Ambulatory Visit: Payer: Self-pay

## 2019-08-28 ENCOUNTER — Encounter: Payer: Self-pay | Admitting: Internal Medicine

## 2019-08-28 VITALS — BP 142/88 | HR 81 | Temp 98.0°F | Ht 69.0 in | Wt 271.0 lb

## 2019-08-28 DIAGNOSIS — U071 COVID-19: Secondary | ICD-10-CM | POA: Diagnosis not present

## 2019-08-28 DIAGNOSIS — R0600 Dyspnea, unspecified: Secondary | ICD-10-CM

## 2019-08-28 DIAGNOSIS — E785 Hyperlipidemia, unspecified: Secondary | ICD-10-CM | POA: Diagnosis not present

## 2019-08-28 DIAGNOSIS — E1165 Type 2 diabetes mellitus with hyperglycemia: Secondary | ICD-10-CM

## 2019-08-28 DIAGNOSIS — Z794 Long term (current) use of insulin: Secondary | ICD-10-CM

## 2019-08-28 DIAGNOSIS — R0609 Other forms of dyspnea: Secondary | ICD-10-CM

## 2019-08-28 DIAGNOSIS — N320 Bladder-neck obstruction: Secondary | ICD-10-CM

## 2019-08-28 MED ORDER — HYDROCOD POLST-CPM POLST ER 10-8 MG/5ML PO SUER: 5.0000 mL | Freq: Two times a day (BID) | ORAL | 0 refills | Status: DC | PRN

## 2019-08-28 NOTE — Assessment & Plan Note (Signed)
DOE post COVID CT chest Tussionex prn cough

## 2019-08-28 NOTE — Patient Instructions (Signed)

## 2019-08-28 NOTE — Progress Notes (Signed)
Subjective:  Patient ID: [name] [name], male    DOB: 06/17/1950  Age: 69 y.o. MRN: [id number]  CC: No chief complaint on file.   HPI [name] [name] presents for HTN, DM, LBP C/o SOB after COVID19 Dr [name] [name] d/c'd [name] [name]  Outpatient Medications Prior to Visit  Medication Sig Dispense Refill  . amLODipine (NORVASC) 5 MG tablet Take 1 tablet (5 mg total) by mouth daily. 90 tablet 3  . aspirin 81 MG tablet Take 81 mg by mouth daily.      . B-D ULTRAFINE III SHORT PEN 31G X 8 MM MISC USE AS DIRECTED 200 each 3  . [place]-Glucosamine-Vit D (OSTEO BI-FLEX ONE PER DAY PO) Take 2 tablets by mouth daily.    . cholecalciferol (VITAMIN D) 1000 units tablet Take 3,000 Units by mouth daily.    . fexofenadine (ALLEGRA) 180 MG tablet Take 180 mg by mouth daily.    . furosemide (LASIX) 40 MG tablet Take 1 tablet (40 mg total) by mouth daily as needed. (Patient taking differently: Take 40 mg by mouth daily as needed for fluid. ) 90 tablet 1  . gabapentin (NEURONTIN) 600 MG tablet Take 1 tablet (600 mg total) by mouth 2 (two) times daily. 180 tablet 3  . HYDROcodone-acetaminophen (NORCO) 10-325 MG tablet Take 1 tablet by mouth every 8 (eight) hours as needed for severe pain.    . Insulin Glargine, 1 Unit Dial, (TOUJEO SOLOSTAR) 300 UNIT/ML SOPN Inject 60 Units into the skin daily before breakfast.     . ketorolac (TORADOL) 10 MG tablet Take 10 mg by mouth every 8 (eight) hours as needed for moderate pain.    Marland Kitchen losartan (COZAAR) 100 MG tablet Take 1 tablet (100 mg total) by mouth daily. 90 tablet 3  . metoprolol succinate (TOPROL-XL) 25 MG 24 hr tablet Take 1 tablet (25 mg total) by mouth daily. 90 tablet 3  . ondansetron (ZOFRAN ODT) 4 MG disintegrating tablet Take 1 tablet (4 mg total) by mouth every 8 (eight) hours as needed for nausea or vomiting. 20 tablet 0  . pravastatin (PRAVACHOL) 40 MG tablet Take 1 tablet (40 mg total) by mouth daily. 90 tablet 3  . promethazine (PHENERGAN) 25 MG tablet  Take 25 mg by mouth every 6 (six) hours as needed for nausea or vomiting.    . tamsulosin (FLOMAX) 0.4 MG CAPS capsule Take 1 capsule (0.4 mg total) by mouth daily after supper. 90 capsule 3  . empagliflozin (JARDIANCE) 10 MG TABS tablet Take 10 mg by mouth daily. (Patient not taking: Reported on 08/28/2019) 90 tablet 3  . azithromycin (ZITHROMAX Z-PAK) 250 MG tablet As directed 6 tablet 0  . chlorpheniramine-HYDROcodone (TUSSIONEX PENNKINETIC ER) 10-8 MG/5ML SUER Take 5 mLs by mouth every 12 (twelve) hours as needed for cough. 230 mL 0   No facility-administered medications prior to visit.    ROS: Review of Systems  Constitutional: Negative for appetite change, fatigue and unexpected weight change.  HENT: Negative for congestion, nosebleeds, sneezing, sore throat and trouble swallowing.   Eyes: Negative for itching and visual disturbance.  Respiratory: Negative for cough.   Cardiovascular: Negative for chest pain, palpitations and leg swelling.  Gastrointestinal: Negative for abdominal distention, blood in stool, diarrhea and nausea.  Genitourinary: Negative for frequency and hematuria.  Musculoskeletal: Positive for arthralgias and back pain. Negative for gait problem, joint swelling and neck pain.  Skin: Negative for rash.  Neurological: Negative for dizziness, tremors, speech difficulty and weakness.  Psychiatric/Behavioral: Negative for  agitation, dysphoric mood, sleep disturbance and suicidal ideas. The patient is not nervous/anxious.     Objective:  BP (!) 142/88 (BP Location: Left Arm, Patient Position: Sitting, Cuff Size: Large)   Pulse 81   Temp 98 F (36.7 C) (Oral)   Ht 5\' 9"  (1.753 m)   Wt 271 lb (122.9 kg)   SpO2 98%   BMI 40.02 kg/m   BP Readings from Last 3 Encounters:  08/28/19 (!) 142/88  02/20/19 (!) 195/87  01/08/19 120/70    Wt Readings from Last 3 Encounters:  08/28/19 271 lb (122.9 kg)  02/19/19 271 lb (122.9 kg)  01/08/19 266 lb (120.7 kg)     Physical Exam Constitutional:      General: He is not in acute distress.    Appearance: He is well-developed.     Comments: NAD  Eyes:     Conjunctiva/sclera: Conjunctivae normal.     Pupils: Pupils are equal, round, and reactive to light.  Neck:     Thyroid: No thyromegaly.     Vascular: No JVD.  Cardiovascular:     Rate and Rhythm: Normal rate and regular rhythm.     Heart sounds: Normal heart sounds. No murmur. No friction rub. No gallop.   Pulmonary:     Effort: Pulmonary effort is normal. No respiratory distress.     Breath sounds: Normal breath sounds. No wheezing or rales.  Chest:     Chest wall: No tenderness.  Abdominal:     General: Bowel sounds are normal. There is no distension.     Palpations: Abdomen is soft. There is no mass.     Tenderness: There is no abdominal tenderness. There is no guarding or rebound.  Musculoskeletal:        General: No tenderness. Normal range of motion.     Cervical back: Normal range of motion.  Lymphadenopathy:     Cervical: No cervical adenopathy.  Skin:    General: Skin is warm and dry.     Findings: No rash.  Neurological:     Mental Status: He is alert and oriented to person, place, and time.     Cranial Nerves: No cranial nerve deficit.     Motor: No abnormal muscle tone.     Coordination: Coordination normal.     Gait: Gait normal.     Deep Tendon Reflexes: Reflexes are normal and symmetric.  Psychiatric:        Behavior: Behavior normal.        Thought Content: Thought content normal.        Judgment: Judgment normal.     Lab Results  Component Value Date   WBC 11.6 (H) 02/19/2019   HGB 15.6 02/19/2019   HCT 49.7 02/19/2019   PLT 284 02/19/2019   GLUCOSE 259 (H) 02/19/2019   CHOL 187 07/06/2018   TRIG 123.0 07/06/2018   HDL 46.00 07/06/2018   LDLDIRECT 200.0 02/17/2016   LDLCALC 117 (H) 07/06/2018   ALT 14 07/06/2018   AST 13 07/06/2018   NA 138 02/19/2019   K 4.4 02/19/2019   CL 105 02/19/2019    CREATININE 2.12 (H) 02/19/2019   BUN 34 (H) 02/19/2019   CO2 22 02/19/2019   TSH 3.57 07/06/2018   PSA 0.59 07/06/2018   INR 1.08 02/02/2011   HGBA1C 7.2 (H) 09/28/2017   MICROALBUR 195.1 (H) 02/17/2016    MR ABDOMEN WWO CONTRAST  Result Date: 04/16/2019 CLINICAL DATA:  Hyperdense right renal lesion seen on previous CT  stone study. EXAM: MRI ABDOMEN WITHOUT AND WITH CONTRAST TECHNIQUE: Multiplanar multisequence MR imaging of the abdomen was performed both before and after the administration of intravenous contrast. CONTRAST:  31mL MULTIHANCE GADOBENATE DIMEGLUMINE 529 MG/ML IV SOLN COMPARISON:  CT scan 02/19/2019 FINDINGS: Motion degraded study. Lower chest: Unremarkable. Hepatobiliary: No suspicious focal abnormality within the liver parenchyma. 9 mm well-defined homogeneous T2 hyperintense, T1 intermediate intensity lesion identified posterior right liver (axial T2 imaging on image 24. This lesion shows no enhancement after IV contrast administration and was present on a CT scan from 02/23/2016, consistent with benign etiology. Ancreas: No focal mass lesion. No dilatation of the main duct. No intraparenchymal cyst. No peripancreatic edema. Spleen:  No splenomegaly. No focal mass lesion. Adrenals/Urinary Tract: No adrenal nodule or mass. 1.5 cm simple cyst noted interpolar right kidney. Exophytic lesion of concern at the lower pole the right kidney measures 19 mm on today's MRI. This has intermediate signal intensity on both T1 and T2 imaging. Assessment of enhancement within the lesion is limited by motion artifact, but there is no discernible enhancement on axial postcontrast and subtraction sequences. Coronal postcontrast imaging raises the question of a small nodular focus of mild enhancement along the inferior margin of the lesion (see coronal 64/series 16). Scattered tiny cortical cysts are noted in the kidneys bilaterally. Stomach/Bowel: Stomach is unremarkable. No gastric wall thickening. No  evidence of outlet obstruction. Duodenum is normally positioned as is the ligament of [place]itz. No small bowel or colonic dilatation within the visualized abdomen. Vascular/Lymphatic: No abdominal aortic aneurysm. No abdominal lymphadenopathy. Other:  No intraperitoneal free fluid. Musculoskeletal: No abnormal marrow enhancement within the visualized bony anatomy. IMPRESSION: 1. Fairly marked limitation of image quality due to motion artifact. 2. Exophytic 19 mm lesion of concern at the lower pole right kidney has intermediate signal intensity on both T1 and T2 imaging with no evidence for gross enhancement after IV contrast administration although coronal imaging raises the question of a small nodular focus of enhancement inferiorly. Given the motion degradation, this lesion is characterized as Bosniak IIF. Follow-up MRI in 3-6 months could be used to ensure stability. 3. Numerous tiny cortical T2 hyperintensities in each kidney without enhancement, compatible with tiny cysts. Electronically Signed   By: Misty Stanley M.D.   On: 04/16/2019 15:11    Assessment & Plan:    Walker Kehr, MD

## 2019-08-28 NOTE — Assessment & Plan Note (Signed)
F/u w/Dr Eleanora Neighbor

## 2019-08-28 NOTE — Assessment & Plan Note (Signed)
Post COVID CT chest Labs Wt loss

## 2019-09-13 ENCOUNTER — Other Ambulatory Visit: Payer: Self-pay | Admitting: Urology

## 2019-09-13 DIAGNOSIS — D49511 Neoplasm of unspecified behavior of right kidney: Secondary | ICD-10-CM

## 2019-09-25 ENCOUNTER — Encounter: Payer: Self-pay | Admitting: Internal Medicine

## 2019-09-25 ENCOUNTER — Other Ambulatory Visit (INDEPENDENT_AMBULATORY_CARE_PROVIDER_SITE_OTHER): Payer: Medicare Other

## 2019-09-25 DIAGNOSIS — R0600 Dyspnea, unspecified: Secondary | ICD-10-CM | POA: Diagnosis not present

## 2019-09-25 DIAGNOSIS — E1165 Type 2 diabetes mellitus with hyperglycemia: Secondary | ICD-10-CM

## 2019-09-25 DIAGNOSIS — Z794 Long term (current) use of insulin: Secondary | ICD-10-CM | POA: Diagnosis not present

## 2019-09-25 DIAGNOSIS — N320 Bladder-neck obstruction: Secondary | ICD-10-CM

## 2019-09-25 DIAGNOSIS — E785 Hyperlipidemia, unspecified: Secondary | ICD-10-CM | POA: Diagnosis not present

## 2019-09-25 DIAGNOSIS — R0609 Other forms of dyspnea: Secondary | ICD-10-CM

## 2019-09-25 LAB — LIPID PANEL
Cholesterol: 212 mg/dL — ABNORMAL HIGH (ref 0–200)
HDL: 41.1 mg/dL (ref >39.00)
LDL Cholesterol: 137 mg/dL — ABNORMAL HIGH (ref 0–99)
NonHDL: 171.32
Total CHOL/HDL Ratio: 5
Triglycerides: 172 mg/dL — ABNORMAL HIGH (ref 0.0–149.0)
VLDL: 34.4 mg/dL (ref 0.0–40.0)

## 2019-09-25 LAB — CBC WITH DIFFERENTIAL/PLATELET
Basophils Absolute: 0 10*3/uL (ref 0.0–0.1)
Basophils Relative: 0.6 % (ref 0.0–3.0)
Eosinophils Absolute: 0.3 10*3/uL (ref 0.0–0.7)
Eosinophils Relative: 5.6 % — ABNORMAL HIGH (ref 0.0–5.0)
HCT: 40.8 % (ref 39.0–52.0)
Hemoglobin: 13.9 g/dL (ref 13.0–17.0)
Lymphocytes Relative: 22.7 % (ref 12.0–46.0)
Lymphs Abs: 1.4 10*3/uL (ref 0.7–4.0)
MCHC: 34 g/dL (ref 30.0–36.0)
MCV: 80.8 fl (ref 78.0–100.0)
Monocytes Absolute: 0.4 10*3/uL (ref 0.1–1.0)
Monocytes Relative: 6.9 % (ref 3.0–12.0)
Neutro Abs: 3.9 10*3/uL (ref 1.4–7.7)
Neutrophils Relative %: 64.2 % (ref 43.0–77.0)
Platelets: 262 10*3/uL (ref 150.0–400.0)
RBC: 5.05 Mil/uL (ref 4.22–5.81)
RDW: 14.5 % (ref 11.5–15.5)
WBC: 6.1 10*3/uL (ref 4.0–10.5)

## 2019-09-25 LAB — PSA: PSA: 0.62 ng/mL (ref 0.10–4.00)

## 2019-09-25 LAB — BASIC METABOLIC PANEL
BUN: 24 mg/dL — ABNORMAL HIGH (ref 6–23)
CO2: 29 mEq/L (ref 19–32)
Calcium: 8.7 mg/dL (ref 8.4–10.5)
Chloride: 103 mEq/L (ref 96–112)
Creatinine, Ser: 1.4 mg/dL (ref 0.40–1.50)
GFR: 50.23 mL/min — ABNORMAL LOW (ref >60.00)
Glucose, Bld: 109 mg/dL — ABNORMAL HIGH (ref 70–99)
Potassium: 3.8 mEq/L (ref 3.5–5.1)
Sodium: 139 mEq/L (ref 135–145)

## 2019-09-25 LAB — TSH: TSH: 5.08 u[IU]/mL — ABNORMAL HIGH (ref 0.35–4.50)

## 2019-09-25 LAB — HEMOGLOBIN A1C: Hgb A1c MFr Bld: 8.9 % — ABNORMAL HIGH (ref 4.6–6.5)

## 2019-09-27 ENCOUNTER — Other Ambulatory Visit (INDEPENDENT_AMBULATORY_CARE_PROVIDER_SITE_OTHER): Payer: Medicare Other

## 2019-09-27 DIAGNOSIS — R946 Abnormal results of thyroid function studies: Secondary | ICD-10-CM | POA: Diagnosis not present

## 2019-09-27 LAB — T4, FREE: Free T4: 0.71 ng/dL (ref 0.60–1.60)

## 2019-10-10 ENCOUNTER — Other Ambulatory Visit: Payer: Self-pay

## 2019-10-10 ENCOUNTER — Ambulatory Visit
Admission: RE | Admit: 2019-10-10 | Discharge: 2019-10-10 | Disposition: A | Discharge status: Home / Self Care | Payer: Medicare Other | Source: Ambulatory Visit | Attending: Urology | Admitting: Urology

## 2019-10-10 ENCOUNTER — Inpatient Hospital Stay: Admission: RE | Admit: 2019-10-10 | Payer: Medicare Other | Source: Ambulatory Visit

## 2019-10-10 DIAGNOSIS — D49511 Neoplasm of unspecified behavior of right kidney: Secondary | ICD-10-CM

## 2019-10-10 DIAGNOSIS — N281 Cyst of kidney, acquired: Secondary | ICD-10-CM | POA: Diagnosis not present

## 2019-10-10 DIAGNOSIS — D3502 Benign neoplasm of left adrenal gland: Secondary | ICD-10-CM | POA: Diagnosis not present

## 2019-10-10 MED ORDER — GADOBENATE DIMEGLUMINE 529 MG/ML IV SOLN
20.0000 mL | Freq: Once | INTRAVENOUS | Status: AC | PRN
  Administered 2019-10-10: 20 mL via INTRAVENOUS

## 2019-10-23 DIAGNOSIS — R351 Nocturia: Secondary | ICD-10-CM | POA: Diagnosis not present

## 2019-10-23 DIAGNOSIS — N281 Cyst of kidney, acquired: Secondary | ICD-10-CM | POA: Diagnosis not present

## 2019-10-23 DIAGNOSIS — N401 Enlarged prostate with lower urinary tract symptoms: Secondary | ICD-10-CM | POA: Diagnosis not present

## 2019-10-23 DIAGNOSIS — R3912 Poor urinary stream: Secondary | ICD-10-CM | POA: Diagnosis not present

## 2019-11-06 DIAGNOSIS — R609 Edema, unspecified: Secondary | ICD-10-CM | POA: Diagnosis not present

## 2019-11-06 DIAGNOSIS — E1165 Type 2 diabetes mellitus with hyperglycemia: Secondary | ICD-10-CM | POA: Diagnosis not present

## 2019-11-06 DIAGNOSIS — I10 Essential (primary) hypertension: Secondary | ICD-10-CM | POA: Diagnosis not present

## 2019-11-06 DIAGNOSIS — E7800 Pure hypercholesterolemia, unspecified: Secondary | ICD-10-CM | POA: Diagnosis not present

## 2019-11-28 DIAGNOSIS — G4733 Obstructive sleep apnea (adult) (pediatric): Secondary | ICD-10-CM | POA: Diagnosis not present

## 2019-12-03 ENCOUNTER — Other Ambulatory Visit: Payer: Self-pay

## 2019-12-03 ENCOUNTER — Emergency Department
Admission: RE | Admit: 2019-12-03 | Discharge: 2019-12-03 | Disposition: A | Discharge status: Home / Self Care | Payer: Medicare Other | Source: Ambulatory Visit

## 2019-12-03 VITALS — BP 157/84 | HR 72 | Temp 98.9°F | Resp 20

## 2019-12-03 DIAGNOSIS — L03032 Cellulitis of left toe: Secondary | ICD-10-CM

## 2019-12-03 MED ORDER — DOXYCYCLINE HYCLATE 100 MG PO CAPS: 100.0000 mg | ORAL_CAPSULE | Freq: Two times a day (BID) | ORAL | 0 refills | Status: AC

## 2019-12-03 NOTE — ED Triage Notes (Signed)
Patient presents to Urgent Care with complaints of left big toe pain since yesterday. Patient reports he thinks there was an abscess under his toenail that has opened and is draining. Area does not appear to be draining at this time, redness and swelling noted around the nail.  Pt is diabetic.

## 2019-12-03 NOTE — ED Provider Notes (Signed)
[facility]    CSN: [id number] Arrival date & time: 12/03/19  1117      History   Chief Complaint Chief Complaint  Patient presents with  . Toe Pain    HPI [name] [name] is a 68 y.o. male.   HPI  [name] [name] is a 69 y.o. male presenting to UC with c/o Left big toe pain since yesterday. Associated redness, small amount of drainage.  Pain is aching and sore.  Tender to light touch.  He notes he has had issues with his nails, his wife cuts his nails for him.  He is a diabetic but does not have a podiatrist.  Denies fever, chills, n/v/d.    Past Medical History:  Diagnosis Date  . Bursitis of left shoulder   . Cervical disc disease   . Diabetes mellitus, type 2 ([place])   . Diverticulosis of colon   . Erectile dysfunction   . Exogenous obesity   . History of anemia   . Hyperlipidemia   . Hypertension   . Internal hemorrhoids   . Internal hemorrhoids with complication - fecal smearing 09/11/2018  . OSA (obstructive sleep apnea)    cpap  . Testicular hypofunction     Patient Active Problem List   Diagnosis Date Noted  . COVID-19 virus infection 05/31/2019  . Internal hemorrhoids with complication - fecal smearing 09/11/2018  . Syncope 12/09/2016  . Umbilical hernia 40/81/4481  . Rectal discharge 11/19/2016  . CTS (carpal tunnel syndrome) 11/19/2016  . Edema 02/27/2016  . Pain, upper extremity 07/25/2015  . Constipation 06/26/2015  . LVH (left ventricular hypertrophy) 05/21/2015  . Pulmonary hypertension due to sleep-disordered breathing ([place]) 05/21/2015  . Sciatic nerve pain 12/18/2014  . Right ureteral stone 08/24/2014  . Right flank pain 08/24/2014  . Dizzinesses 07/31/2014  . Cough 07/31/2014  . Abdominal pain, LLQ 06/23/2014  . Kidney stones 06/17/2014  . Well adult exam 11/22/2013  . Coronary atherosclerosis of native coronary artery 07/19/2012  . DOE (dyspnea on exertion) 11/09/2010  . ANEMIA 02/15/2010  . DIVERTICULOSIS OF COLON  02/15/2010  . Obstructive sleep apnea 01/22/2010  . TESTICULAR HYPOFUNCTION 11/27/2008  . ERECTILE DYSFUNCTION, ORGANIC 11/27/2008  . Diabetes mellitus type 2, controlled ([place]) 12/14/2007  . Obesity, morbid ([place]) 12/14/2007  . [place] DISEASE, CERVICAL 07/17/2007  . BURSITIS, LEFT SHOULDER 07/17/2007  . Dyslipidemia 10/07/2006  . Essential hypertension 10/07/2006    Past Surgical History:  Procedure Laterality Date  . COLONOSCOPY    . POSTERIOR LAMINECTOMY / DECOMPRESSION CERVICAL SPINE     c5-6 fusion 12/08 Dr. Consuello Masse  . s/p cervical disc surgery and fusion  10/09   Dr. Consuello Masse       Home Medications    Prior to Admission medications   Medication Sig Start Date End Date Taking? Authorizing Provider  empagliflozin (JARDIANCE) 10 MG TABS tablet Take 10 mg by mouth daily. 01/08/19  Yes Plotnikov, Evie Lacks, MD  amLODipine (NORVASC) 5 MG tablet Take 1 tablet (5 mg total) by mouth daily. 01/08/19 01/08/20  Plotnikov, Evie Lacks, MD  aspirin 81 MG tablet Take 81 mg by mouth daily.      [provider]  B-D ULTRAFINE III SHORT PEN 31G X 8 MM MISC USE AS DIRECTED 12/29/17   Plotnikov, Evie Lacks, MD  Boswellia-Glucosamine-Vit D (OSTEO BI-FLEX ONE PER DAY PO) Take 2 tablets by mouth daily.    [provider]  chlorpheniramine-HYDROcodone (TUSSIONEX PENNKINETIC ER) 10-8 MG/5ML SUER Take 5 mLs by mouth every  12 (twelve) hours as needed for cough. 08/28/19   Plotnikov, Evie Lacks, MD  cholecalciferol (VITAMIN D) 1000 units tablet Take 3,000 Units by mouth daily.    [provider]  doxycycline (VIBRAMYCIN) 100 MG capsule Take 1 capsule (100 mg total) by mouth 2 (two) times daily for 7 days. 12/03/19 12/10/19  Noe Gens, PA-C  fexofenadine (ALLEGRA) 180 MG tablet Take 180 mg by mouth daily.    [provider]  furosemide (LASIX) 40 MG tablet Take 1 tablet (40 mg total) by mouth daily as needed. Patient taking differently: Take 40 mg by mouth daily as needed for  fluid.  01/08/19   Plotnikov, Evie Lacks, MD  gabapentin (NEURONTIN) 600 MG tablet Take 1 tablet (600 mg total) by mouth 2 (two) times daily. 01/03/19   Narda Amber K, DO  HYDROcodone-acetaminophen (NORCO) 10-325 MG tablet Take 1 tablet by mouth every 8 (eight) hours as needed for severe pain.    [provider]  Insulin Glargine, 1 Unit Dial, (TOUJEO SOLOSTAR) 300 UNIT/ML SOPN Inject 60 Units into the skin daily before breakfast.     [provider]  ketorolac (TORADOL) 10 MG tablet Take 10 mg by mouth every 8 (eight) hours as needed for moderate pain.    [provider]  losartan (COZAAR) 100 MG tablet Take 1 tablet (100 mg total) by mouth daily. 01/08/19   Plotnikov, Evie Lacks, MD  metoprolol succinate (TOPROL-XL) 25 MG 24 hr tablet Take 1 tablet (25 mg total) by mouth daily. 01/08/19   Plotnikov, Evie Lacks, MD  ondansetron (ZOFRAN ODT) 4 MG disintegrating tablet Take 1 tablet (4 mg total) by mouth every 8 (eight) hours as needed for nausea or vomiting. 02/20/19   Charlann Lange, PA-C  pravastatin (PRAVACHOL) 40 MG tablet Take 1 tablet (40 mg total) by mouth daily. 01/08/19   Plotnikov, Evie Lacks, MD  promethazine (PHENERGAN) 25 MG tablet Take 25 mg by mouth every 6 (six) hours as needed for nausea or vomiting.    [provider]  tamsulosin (FLOMAX) 0.4 MG CAPS capsule Take 1 capsule (0.4 mg total) by mouth daily after supper. 01/08/19   Plotnikov, Evie Lacks, MD    Family History Family History  Problem Relation Age of Onset  . Heart disease Father        Died of MI at age 70  . Breast cancer Mother   . Diabetes Paternal Grandmother   . Healthy Son   . Colon cancer Neg Hx     Social History Social History   Tobacco Use  . Smoking status: Former Smoker    Quit date: 01/20/1981    Years since quitting: 38.8  . Smokeless tobacco: Never Used  Vaping Use  . Vaping Use: Never used  Substance Use Topics  . Alcohol use: Yes    Alcohol/week: 7.0 standard  drinks    Types: 4 Glasses of wine, 3 Standard drinks or equivalent per week    Comment: He drinks 3-4 glasses of wine several times per week.  . Drug use: No     Allergies   Amoxicillin, Lisinopril, Penicillins, and Simvastatin   Review of Systems Review of Systems  Constitutional: Negative for chills and fever.  Musculoskeletal: Positive for arthralgias.  Skin: Positive for color change and wound.     Physical Exam Triage Vital Signs ED Triage Vitals  Enc Vitals Group     BP 12/03/19 1136 (!) 157/84     Pulse Rate 12/03/19 1136 72  Resp 12/03/19 1136 20     Temp 12/03/19 1136 98.9 F (37.2 C)     Temp Source 12/03/19 1136 Oral     SpO2 12/03/19 1136 97 %     Weight --      Height --      Head Circumference --      Peak Flow --      Pain Score 12/03/19 1133 1     Pain Loc --      Pain Edu? --      Excl. in [place]? --    No data found.  Updated Vital Signs BP (!) 157/84 (BP Location: Left Arm)   Pulse 72   Temp 98.9 F (37.2 C) (Oral)   Resp 20   SpO2 97%   Visual Acuity Right Eye Distance:   Left Eye Distance:   Bilateral Distance:    Right Eye Near:   Left Eye Near:    Bilateral Near:     Physical Exam Vitals and nursing note reviewed.  Constitutional:      Appearance: He is well-developed.  HENT:     Head: Normocephalic and atraumatic.  Cardiovascular:     Rate and Rhythm: Normal rate.  Pulmonary:     Effort: Pulmonary effort is normal.  Musculoskeletal:        General: Swelling and tenderness present. Normal range of motion.     Cervical back: Normal range of motion.     Comments: Left great toe: mild edema and tenderness around nail, full ROM  Skin:    General: Skin is warm and dry.     Findings: Erythema present.     Comments: Left great toe: erythema, edema, fluctuance and tenderness lateral aspect of nail. No active drainage.  Nail is thickened, yellowed ingrown.   Neurological:     Mental Status: He is alert and oriented to person,  place, and time.  Psychiatric:        Behavior: Behavior normal.      UC Treatments / Results  Labs (all labs ordered are listed, but only abnormal results are displayed) Labs Reviewed  WOUND CULTURE    EKG   Radiology No results found.  Procedures Incision and Drainage  Date/Time: 12/05/2019 12:31 PM Performed by: Noe Gens, PA-C Authorized by: Noe Gens, PA-C   Consent:    Consent obtained:  Verbal   Consent given by:  Patient   Risks discussed:  Bleeding, incomplete drainage, infection, pain and damage to other organs   Alternatives discussed:  No treatment Location:    Type:  Abscess   Size:  1cm   Location:  Lower extremity   Lower extremity location:  Toe   Toe location:  L big toe Pre-procedure details:    Procedure prep: alcohol swab. Anesthesia (see MAR for exact dosages):    Anesthesia method:  None Procedure type:    Complexity:  Simple Procedure details:    Incision types:  Stab incision (18gtt needle)   Incision depth:  Subcutaneous   Drainage:  Purulent   Drainage amount:  Scant   Wound treatment:  Wound left open   Packing materials:  None Post-procedure details:    Patient tolerance of procedure:  Tolerated well, no immediate complications   (including critical care time)  Medications Ordered in UC Medications - No data to display  Initial Impression / Assessment and Plan / UC Course  I have reviewed the triage vital signs and the nursing notes.  Pertinent labs &  imaging results that were available during my care of the patient were reviewed by me and considered in my medical decision making (see chart for details).    Paronychia of Left great toe, drained as noted above Will start empirically on doxycycline, wound culture sent Encouraged f/u with podiatry.   Final Clinical Impressions(s) / UC Diagnoses   Final diagnoses:  Paronychia of great toe, left     Discharge Instructions      You should change the bandage  2-3 times daily after soaking your foot in warm water and Epson salt. Gently massage skin away from the nail.   Call to schedule a follow up appointment with a podiatrist later this week or next for recheck of symptoms and ongoing treatment of ingrown nail.     ED Prescriptions    Medication Sig Dispense Auth. Provider   doxycycline (VIBRAMYCIN) 100 MG capsule Take 1 capsule (100 mg total) by mouth 2 (two) times daily for 7 days. 14 capsule Noe Gens, [place]     PDMP not reviewed this encounter.   Noe Gens, [place] 12/05/19 1233

## 2019-12-03 NOTE — Discharge Instructions (Signed)
  You should change the bandage 2-3 times daily after soaking your foot in warm water and Epson salt. Gently massage skin away from the nail.   Call to schedule a follow up appointment with a podiatrist later this week or next for recheck of symptoms and ongoing treatment of ingrown nail.

## 2019-12-05 DIAGNOSIS — L03032 Cellulitis of left toe: Secondary | ICD-10-CM | POA: Diagnosis not present

## 2019-12-07 LAB — WOUND CULTURE
MICRO NUMBER:: 10748744
SPECIMEN QUALITY:: ADEQUATE

## 2019-12-13 DIAGNOSIS — E7800 Pure hypercholesterolemia, unspecified: Secondary | ICD-10-CM | POA: Diagnosis not present

## 2019-12-13 DIAGNOSIS — E1165 Type 2 diabetes mellitus with hyperglycemia: Secondary | ICD-10-CM | POA: Diagnosis not present

## 2019-12-13 DIAGNOSIS — R609 Edema, unspecified: Secondary | ICD-10-CM | POA: Diagnosis not present

## 2019-12-13 DIAGNOSIS — I10 Essential (primary) hypertension: Secondary | ICD-10-CM | POA: Diagnosis not present

## 2019-12-24 DIAGNOSIS — G4733 Obstructive sleep apnea (adult) (pediatric): Secondary | ICD-10-CM | POA: Diagnosis not present

## 2020-01-30 DIAGNOSIS — I10 Essential (primary) hypertension: Secondary | ICD-10-CM

## 2020-01-30 MED ORDER — AMLODIPINE BESYLATE 5 MG PO TABS: 5.0000 mg | ORAL_TABLET | Freq: Every day | ORAL | 3 refills | Status: DC

## 2020-01-30 MED ORDER — LOSARTAN POTASSIUM 100 MG PO TABS: 100.0000 mg | ORAL_TABLET | Freq: Every day | ORAL | 3 refills | Status: DC

## 2020-01-30 MED ORDER — METOPROLOL SUCCINATE ER 25 MG PO TB24: 25.0000 mg | ORAL_TABLET | Freq: Every day | ORAL | 3 refills | Status: DC

## 2020-02-12 DIAGNOSIS — H524 Presbyopia: Secondary | ICD-10-CM | POA: Diagnosis not present

## 2020-02-14 DIAGNOSIS — L821 Other seborrheic keratosis: Secondary | ICD-10-CM | POA: Diagnosis not present

## 2020-02-14 DIAGNOSIS — L578 Other skin changes due to chronic exposure to nonionizing radiation: Secondary | ICD-10-CM | POA: Diagnosis not present

## 2020-02-14 DIAGNOSIS — D485 Neoplasm of uncertain behavior of skin: Secondary | ICD-10-CM | POA: Diagnosis not present

## 2020-02-14 DIAGNOSIS — Z85820 Personal history of malignant melanoma of skin: Secondary | ICD-10-CM | POA: Diagnosis not present

## 2020-02-19 ENCOUNTER — Ambulatory Visit: Payer: Medicare Other | Attending: Internal Medicine

## 2020-02-19 DIAGNOSIS — Z23 Encounter for immunization: Secondary | ICD-10-CM

## 2020-02-19 NOTE — Progress Notes (Signed)
   Covid-19 Vaccination Clinic  Name:  [name] [name]    MRN: [id number] DOB: [birth year]  02/19/2020  [name] was observed post Covid-19 immunization for 15 minutes without incident. He was provided with Vaccine Information Sheet and instruction to access the V-Safe system.   [name] was instructed to call 911 with any severe reactions post vaccine: Marland Kitchen Difficulty breathing  . Swelling of face and throat  . A fast heartbeat  . A bad rash all over body  . Dizziness and weakness

## 2020-02-28 ENCOUNTER — Ambulatory Visit (INDEPENDENT_AMBULATORY_CARE_PROVIDER_SITE_OTHER): Payer: Medicare Other | Admitting: Internal Medicine

## 2020-02-28 ENCOUNTER — Encounter: Payer: Self-pay | Admitting: Internal Medicine

## 2020-02-28 ENCOUNTER — Other Ambulatory Visit: Payer: Self-pay

## 2020-02-28 VITALS — BP 142/68 | HR 80 | Temp 98.6°F | Ht 69.0 in | Wt 236.2 lb

## 2020-02-28 DIAGNOSIS — Z0000 Encounter for general adult medical examination without abnormal findings: Secondary | ICD-10-CM | POA: Diagnosis not present

## 2020-02-28 DIAGNOSIS — R059 Cough, unspecified: Secondary | ICD-10-CM | POA: Diagnosis not present

## 2020-02-28 DIAGNOSIS — E785 Hyperlipidemia, unspecified: Secondary | ICD-10-CM

## 2020-02-28 DIAGNOSIS — T753XXA Motion sickness, initial encounter: Secondary | ICD-10-CM | POA: Insufficient documentation

## 2020-02-28 DIAGNOSIS — I10 Essential (primary) hypertension: Secondary | ICD-10-CM

## 2020-02-28 DIAGNOSIS — Z794 Long term (current) use of insulin: Secondary | ICD-10-CM

## 2020-02-28 DIAGNOSIS — E1165 Type 2 diabetes mellitus with hyperglycemia: Secondary | ICD-10-CM | POA: Diagnosis not present

## 2020-02-28 DIAGNOSIS — I2510 Atherosclerotic heart disease of native coronary artery without angina pectoris: Secondary | ICD-10-CM

## 2020-02-28 MED ORDER — TAMSULOSIN HCL 0.4 MG PO CAPS: 0.4000 mg | ORAL_CAPSULE | Freq: Every day | ORAL | 3 refills | Status: DC

## 2020-02-28 MED ORDER — SCOPOLAMINE 1 MG/3DAYS TD PT72: 1.0000 | MEDICATED_PATCH | TRANSDERMAL | 0 refills | Status: DC

## 2020-02-28 MED ORDER — HYDROCOD POLST-CPM POLST ER 10-8 MG/5ML PO SUER: 5.0000 mL | Freq: Two times a day (BID) | ORAL | 0 refills | Status: DC | PRN

## 2020-02-28 MED ORDER — PRAVASTATIN SODIUM 40 MG PO TABS: 40.0000 mg | ORAL_TABLET | Freq: Every day | ORAL | 3 refills | Status: DC

## 2020-02-28 MED ORDER — AMLODIPINE BESYLATE 5 MG PO TABS: 5.0000 mg | ORAL_TABLET | Freq: Every day | ORAL | 3 refills | Status: DC

## 2020-02-28 MED ORDER — LOSARTAN POTASSIUM 100 MG PO TABS: 100.0000 mg | ORAL_TABLET | Freq: Every day | ORAL | 3 refills | Status: DC

## 2020-02-28 MED ORDER — EMPAGLIFLOZIN 10 MG PO TABS: 10.0000 mg | ORAL_TABLET | Freq: Every day | ORAL | 3 refills | Status: DC

## 2020-02-28 MED ORDER — METOPROLOL SUCCINATE ER 25 MG PO TB24: 25.0000 mg | ORAL_TABLET | Freq: Every day | ORAL | 3 refills | Status: DC

## 2020-02-28 NOTE — Assessment & Plan Note (Signed)
Recurrent Tussionex prn - rare use  Potential benefits of opioids use as well as potential risks (i.e. addiction risk, apnea etc) and complications (i.e. Somnolence, constipation and others) were explained to the patient and were aknowledged.

## 2020-02-28 NOTE — Progress Notes (Signed)
Subjective:  Patient ID: [name] [name], male    DOB: 03/26/1951  Age: 69 y.o. MRN: [id number]  CC: Annual Exam   HPI [name] [name] presents for a well exam  C/o motion sickness - going on a cruise 17 d to [place] from [place]  F/u cough, obesity, HTN  Outpatient Medications Prior to Visit  Medication Sig Dispense Refill  . amLODipine (NORVASC) 5 MG tablet Take 1 tablet (5 mg total) by mouth daily. 90 tablet 3  . aspirin 81 MG tablet Take 81 mg by mouth daily.      . B-D ULTRAFINE III SHORT PEN 31G X 8 MM MISC USE AS DIRECTED 200 each 3  . [place]-Glucosamine-Vit D (OSTEO BI-FLEX ONE PER DAY PO) Take 2 tablets by mouth daily.    . chlorpheniramine-HYDROcodone (TUSSIONEX PENNKINETIC ER) 10-8 MG/5ML SUER Take 5 mLs by mouth every 12 (twelve) hours as needed for cough. 230 mL 0  . cholecalciferol (VITAMIN D) 1000 units tablet Take 3,000 Units by mouth daily.    . empagliflozin (JARDIANCE) 10 MG TABS tablet Take 10 mg by mouth daily. 90 tablet 3  . fexofenadine (ALLEGRA) 180 MG tablet Take 180 mg by mouth daily.    . furosemide (LASIX) 40 MG tablet Take 1 tablet (40 mg total) by mouth daily as needed. (Patient taking differently: Take 40 mg by mouth daily as needed for fluid. ) 90 tablet 1  . gabapentin (NEURONTIN) 600 MG tablet Take 1 tablet (600 mg total) by mouth 2 (two) times daily. 180 tablet 3  . HYDROcodone-acetaminophen (NORCO) 10-325 MG tablet Take 1 tablet by mouth every 8 (eight) hours as needed for severe pain.    . Insulin Glargine, 1 Unit Dial, (TOUJEO SOLOSTAR) 300 UNIT/ML SOPN Inject 60 Units into the skin daily before breakfast.     . ketorolac (TORADOL) 10 MG tablet Take 10 mg by mouth every 8 (eight) hours as needed for moderate pain.    Marland Kitchen losartan (COZAAR) 100 MG tablet Take 1 tablet (100 mg total) by mouth daily. 90 tablet 3  . metoprolol succinate (TOPROL-XL) 25 MG 24 hr tablet Take 1 tablet (25 mg total) by mouth daily. 90 tablet 3  . ondansetron (ZOFRAN ODT) 4 MG  disintegrating tablet Take 1 tablet (4 mg total) by mouth every 8 (eight) hours as needed for nausea or vomiting. 20 tablet 0  . pravastatin (PRAVACHOL) 40 MG tablet Take 1 tablet (40 mg total) by mouth daily. 90 tablet 3  . promethazine (PHENERGAN) 25 MG tablet Take 25 mg by mouth every 6 (six) hours as needed for nausea or vomiting.    . tamsulosin (FLOMAX) 0.4 MG CAPS capsule Take 1 capsule (0.4 mg total) by mouth daily after supper. 90 capsule 3   No facility-administered medications prior to visit.    ROS: Review of Systems  Constitutional: Negative for appetite change, fatigue and unexpected weight change.  HENT: Negative for congestion, nosebleeds, sneezing, sore throat and trouble swallowing.   Eyes: Negative for itching and visual disturbance.  Respiratory: Negative for cough.   Cardiovascular: Negative for chest pain, palpitations and leg swelling.  Gastrointestinal: Negative for abdominal distention, blood in stool, diarrhea and nausea.  Genitourinary: Negative for frequency and hematuria.  Musculoskeletal: Negative for back pain, gait problem, joint swelling and neck pain.  Skin: Negative for rash.  Neurological: Negative for dizziness, tremors, speech difficulty and weakness.  Psychiatric/Behavioral: Negative for agitation, dysphoric mood and sleep disturbance. The patient is not nervous/anxious.  Objective:  BP (!) 142/68 (BP Location: Right Arm, Patient Position: Sitting, Cuff Size: Large)   Pulse 80   Temp 98.6 F (37 C) (Oral)   Ht 5\' 9"  (1.753 m)   Wt 236 lb 3.2 oz (107.1 kg)   SpO2 96%   BMI 34.88 kg/m   BP Readings from Last 3 Encounters:  02/28/20 (!) 142/68  12/03/19 (!) 157/84  08/28/19 (!) 142/88    Wt Readings from Last 3 Encounters:  02/28/20 236 lb 3.2 oz (107.1 kg)  08/28/19 271 lb (122.9 kg)  02/19/19 271 lb (122.9 kg)    Physical Exam Constitutional:      General: He is not in acute distress.    Appearance: He is well-developed. He is  obese.     Comments: NAD  Eyes:     Conjunctiva/sclera: Conjunctivae normal.     Pupils: Pupils are equal, round, and reactive to light.  Neck:     Thyroid: No thyromegaly.     Vascular: No JVD.  Cardiovascular:     Rate and Rhythm: Normal rate and regular rhythm.     Heart sounds: Normal heart sounds. No murmur heard.  No friction rub. No gallop.   Pulmonary:     Effort: Pulmonary effort is normal. No respiratory distress.     Breath sounds: Normal breath sounds. No wheezing or rales.  Chest:     Chest wall: No tenderness.  Abdominal:     General: Bowel sounds are normal. There is no distension.     Palpations: Abdomen is soft. There is no mass.     Tenderness: There is no abdominal tenderness. There is no guarding or rebound.  Musculoskeletal:        General: No tenderness. Normal range of motion.     Cervical back: Normal range of motion.  Lymphadenopathy:     Cervical: No cervical adenopathy.  Skin:    General: Skin is warm and dry.     Findings: No rash.  Neurological:     Mental Status: He is alert and oriented to person, place, and time.     Cranial Nerves: No cranial nerve deficit.     Motor: No abnormal muscle tone.     Coordination: Coordination normal.     Gait: Gait normal.     Deep Tendon Reflexes: Reflexes are normal and symmetric.  Psychiatric:        Behavior: Behavior normal.        Thought Content: Thought content normal.        Judgment: Judgment normal.    I spent 22 minutes in addition to time for CPX wellness examination in preparing to see the patient by review of recent labs, imaging and procedures, obtaining and reviewing separately obtained history, communicating with the patient, ordering medications, tests or procedures, and documenting clinical information in the EHR including the differential diagnosis, treatment, and any further evaluation and other management of motion sickness prophylaxis, Obesity, HTN, DM     Assessment & Plan Note by      Lab Results  Component Value Date   WBC 6.1 09/25/2019   HGB 13.9 09/25/2019   HCT 40.8 09/25/2019   PLT 262.0 09/25/2019   GLUCOSE 109 (H) 09/25/2019   CHOL 212 (H) 09/25/2019   TRIG 172.0 (H) 09/25/2019   HDL 41.10 09/25/2019   LDLDIRECT 200.0 02/17/2016   LDLCALC 137 (H) 09/25/2019   ALT 14 07/06/2018   AST 13 07/06/2018   NA 139 09/25/2019   K  3.8 09/25/2019   CL 103 09/25/2019   CREATININE 1.40 09/25/2019   BUN 24 (H) 09/25/2019   CO2 29 09/25/2019   TSH 5.08 (H) 09/25/2019   PSA 0.62 09/25/2019   INR 1.08 02/02/2011   HGBA1C 8.9 (H) 09/25/2019   MICROALBUR 195.1 (H) 02/17/2016    No results found.  Assessment & Plan:    Follow-up: No follow-ups on file.  Walker Kehr, MD

## 2020-02-28 NOTE — Assessment & Plan Note (Signed)
BP Readings from Last 3 Encounters:  02/28/20 (!) 142/68  12/03/19 (!) 157/84  08/28/19 (!) 142/88

## 2020-02-28 NOTE — Assessment & Plan Note (Signed)
Pravastatin  

## 2020-02-28 NOTE — Assessment & Plan Note (Addendum)
Much better F/u w/Dr Chalmers Cater

## 2020-02-28 NOTE — Assessment & Plan Note (Signed)
Lipids 

## 2020-02-28 NOTE — Assessment & Plan Note (Signed)
Scop patch Rx

## 2020-02-28 NOTE — Assessment & Plan Note (Signed)
Toujeo, Jardiance Lost wt >30 lbs

## 2020-03-11 DIAGNOSIS — G4733 Obstructive sleep apnea (adult) (pediatric): Secondary | ICD-10-CM | POA: Diagnosis not present

## 2020-03-27 DIAGNOSIS — Z85820 Personal history of malignant melanoma of skin: Secondary | ICD-10-CM | POA: Diagnosis not present

## 2020-03-27 DIAGNOSIS — L570 Actinic keratosis: Secondary | ICD-10-CM | POA: Diagnosis not present

## 2020-04-02 DIAGNOSIS — I10 Essential (primary) hypertension: Secondary | ICD-10-CM | POA: Diagnosis not present

## 2020-04-02 DIAGNOSIS — E1165 Type 2 diabetes mellitus with hyperglycemia: Secondary | ICD-10-CM | POA: Diagnosis not present

## 2020-04-02 DIAGNOSIS — E7800 Pure hypercholesterolemia, unspecified: Secondary | ICD-10-CM | POA: Diagnosis not present

## 2020-04-02 DIAGNOSIS — N2889 Other specified disorders of kidney and ureter: Secondary | ICD-10-CM | POA: Diagnosis not present

## 2020-04-08 DIAGNOSIS — E1165 Type 2 diabetes mellitus with hyperglycemia: Secondary | ICD-10-CM | POA: Diagnosis not present

## 2020-04-08 DIAGNOSIS — E7800 Pure hypercholesterolemia, unspecified: Secondary | ICD-10-CM | POA: Diagnosis not present

## 2020-04-08 DIAGNOSIS — I10 Essential (primary) hypertension: Secondary | ICD-10-CM | POA: Diagnosis not present

## 2020-04-08 DIAGNOSIS — R609 Edema, unspecified: Secondary | ICD-10-CM | POA: Diagnosis not present

## 2020-04-09 ENCOUNTER — Other Ambulatory Visit: Payer: Medicare Other

## 2020-04-24 DIAGNOSIS — M79604 Pain in right leg: Secondary | ICD-10-CM | POA: Diagnosis not present

## 2020-05-30 ENCOUNTER — Other Ambulatory Visit: Payer: Self-pay

## 2020-06-02 ENCOUNTER — Ambulatory Visit (INDEPENDENT_AMBULATORY_CARE_PROVIDER_SITE_OTHER): Payer: Medicare Other | Admitting: Internal Medicine

## 2020-06-02 ENCOUNTER — Encounter: Payer: Self-pay | Admitting: Internal Medicine

## 2020-06-02 ENCOUNTER — Other Ambulatory Visit: Payer: Self-pay

## 2020-06-02 DIAGNOSIS — E785 Hyperlipidemia, unspecified: Secondary | ICD-10-CM

## 2020-06-02 DIAGNOSIS — I10 Essential (primary) hypertension: Secondary | ICD-10-CM | POA: Diagnosis not present

## 2020-06-02 MED ORDER — SODIUM FLUORIDE 1.1 % DT GEL: 1.0000 "application " | Freq: Every day | DENTAL | 11 refills | Status: DC

## 2020-06-02 NOTE — Assessment & Plan Note (Signed)
Toprol XL, ASA, Amlodipine, Losartan

## 2020-06-02 NOTE — Progress Notes (Signed)
Subjective:  Patient ID: [name] [name], male    DOB: 1950-06-28  Age: 70 y.o. MRN: [id number]  CC: Diabetes (Here for follow up), Hypertension, and Sleep Apnea (Complains of dry mouth when sleeping with the mask)   HPI [name] [name] presents for OSA, DM, HTN C/o dry mouth at night Pt lost wt  Outpatient Medications Prior to Visit  Medication Sig Dispense Refill  . amLODipine (NORVASC) 5 MG tablet Take 1 tablet (5 mg total) by mouth daily. 90 tablet 3  . aspirin 81 MG tablet Take 81 mg by mouth daily.    . B-D ULTRAFINE III SHORT PEN 31G X 8 MM MISC USE AS DIRECTED 200 each 3  . [place]-Glucosamine-Vit D (OSTEO BI-FLEX ONE PER DAY PO) Take 2 tablets by mouth daily.    . chlorpheniramine-HYDROcodone (TUSSIONEX PENNKINETIC ER) 10-8 MG/5ML SUER Take 5 mLs by mouth every 12 (twelve) hours as needed for cough. 230 mL 0  . cholecalciferol (VITAMIN D) 1000 units tablet Take 3,000 Units by mouth daily.    . empagliflozin (JARDIANCE) 10 MG TABS tablet Take 1 tablet (10 mg total) by mouth daily. 90 tablet 3  . fexofenadine (ALLEGRA) 180 MG tablet Take 180 mg by mouth daily.    . furosemide (LASIX) 40 MG tablet Take 1 tablet (40 mg total) by mouth daily as needed. (Patient taking differently: Take 40 mg by mouth daily as needed for fluid.) 90 tablet 1  . gabapentin (NEURONTIN) 600 MG tablet Take 1 tablet (600 mg total) by mouth 2 (two) times daily. 180 tablet 3  . HYDROcodone-acetaminophen (NORCO) 10-325 MG tablet Take 1 tablet by mouth every 8 (eight) hours as needed for severe pain.    Marland Kitchen insulin glargine, 1 Unit Dial, (TOUJEO) 300 UNIT/ML Solostar Pen Inject 60 Units into the skin daily before breakfast.     . ketorolac (TORADOL) 10 MG tablet Take 10 mg by mouth every 8 (eight) hours as needed for moderate pain.    Marland Kitchen losartan (COZAAR) 100 MG tablet Take 1 tablet (100 mg total) by mouth daily. 90 tablet 3  . metoprolol succinate (TOPROL-XL) 25 MG 24 hr tablet Take 1 tablet (25 mg total) by  mouth daily. 90 tablet 3  . ondansetron (ZOFRAN ODT) 4 MG disintegrating tablet Take 1 tablet (4 mg total) by mouth every 8 (eight) hours as needed for nausea or vomiting. 20 tablet 0  . OZEMPIC, 0.25 OR 0.5 MG/DOSE, 2 MG/1.5ML SOPN Inject 0.5 mg into the skin once a week.    . pravastatin (PRAVACHOL) 40 MG tablet Take 1 tablet (40 mg total) by mouth daily. 90 tablet 3  . promethazine (PHENERGAN) 25 MG tablet Take 25 mg by mouth every 6 (six) hours as needed for nausea or vomiting.    Marland Kitchen scopolamine (TRANSDERM-SCOP, 1.5 MG,) 1 MG/3DAYS Place 1 patch (1.5 mg total) onto the skin every 3 (three) days. 5 patch 0  . tamsulosin (FLOMAX) 0.4 MG CAPS capsule Take 1 capsule (0.4 mg total) by mouth daily after supper. 90 capsule 3   No facility-administered medications prior to visit.    ROS: Review of Systems  Constitutional: Negative for appetite change, fatigue and unexpected weight change.  HENT: Negative for congestion, nosebleeds, sneezing, sore throat and trouble swallowing.   Eyes: Negative for itching and visual disturbance.  Respiratory: Negative for cough.   Cardiovascular: Negative for chest pain, palpitations and leg swelling.  Gastrointestinal: Negative for abdominal distention, blood in stool, diarrhea and nausea.  Genitourinary: Negative for  frequency and hematuria.  Musculoskeletal: Negative for back pain, gait problem, joint swelling and neck pain.  Skin: Negative for rash.  Neurological: Negative for dizziness, tremors, speech difficulty and weakness.  Psychiatric/Behavioral: Negative for agitation, dysphoric mood, sleep disturbance and suicidal ideas. The patient is not nervous/anxious.     Objective:  BP (!) 158/86 (BP Location: Right Arm, Patient Position: Sitting, Cuff Size: Large)   Pulse 76   Temp 98.7 F (37.1 C) (Oral)   Resp 16   Ht 5\' 9"  (1.753 m)   Wt 258 lb (117 kg)   SpO2 97%   BMI 38.10 kg/m   BP Readings from Last 3 Encounters:  06/02/20 (!) 158/86   02/28/20 (!) 142/68  12/03/19 (!) 157/84    Wt Readings from Last 3 Encounters:  06/02/20 258 lb (117 kg)  02/28/20 236 lb 3.2 oz (107.1 kg)  08/28/19 271 lb (122.9 kg)    Physical Exam Constitutional:      General: He is not in acute distress.    Appearance: He is well-developed. He is obese.     Comments: NAD  HENT:     Mouth/Throat:     Mouth: Oropharynx is clear and moist.  Eyes:     Conjunctiva/sclera: Conjunctivae normal.     Pupils: Pupils are equal, round, and reactive to light.  Neck:     Thyroid: No thyromegaly.     Vascular: No JVD.  Cardiovascular:     Rate and Rhythm: Normal rate and regular rhythm.     Pulses: Intact distal pulses.     Heart sounds: Normal heart sounds. No murmur heard. No friction rub. No gallop.   Pulmonary:     Effort: Pulmonary effort is normal. No respiratory distress.     Breath sounds: Normal breath sounds. No wheezing or rales.  Chest:     Chest wall: No tenderness.  Abdominal:     General: Bowel sounds are normal. There is no distension.     Palpations: Abdomen is soft. There is no mass.     Tenderness: There is no abdominal tenderness. There is no guarding or rebound.  Musculoskeletal:        General: No tenderness or edema. Normal range of motion.     Cervical back: Normal range of motion.  Lymphadenopathy:     Cervical: No cervical adenopathy.  Skin:    General: Skin is warm and dry.     Findings: No rash.  Neurological:     Mental Status: He is alert and oriented to person, place, and time.     Cranial Nerves: No cranial nerve deficit.     Motor: No abnormal muscle tone.     Coordination: He displays a negative Romberg sign. Coordination normal.     Gait: Gait normal.     Deep Tendon Reflexes: Reflexes are normal and symmetric.  Psychiatric:        Mood and Affect: Mood and affect normal.        Behavior: Behavior normal.        Thought Content: Thought content normal.        Judgment: Judgment normal.     Lab  Results  Component Value Date   WBC 6.1 09/25/2019   HGB 13.9 09/25/2019   HCT 40.8 09/25/2019   PLT 262.0 09/25/2019   GLUCOSE 109 (H) 09/25/2019   CHOL 212 (H) 09/25/2019   TRIG 172.0 (H) 09/25/2019   HDL 41.10 09/25/2019   LDLDIRECT 200.0 02/17/2016   [place]  137 (H) 09/25/2019   ALT 14 07/06/2018   AST 13 07/06/2018   NA 139 09/25/2019   K 3.8 09/25/2019   CL 103 09/25/2019   CREATININE 1.40 09/25/2019   BUN 24 (H) 09/25/2019   CO2 29 09/25/2019   TSH 5.08 (H) 09/25/2019   PSA 0.62 09/25/2019   INR 1.08 02/02/2011   HGBA1C 8.9 (H) 09/25/2019   MICROALBUR 195.1 (H) 02/17/2016    No results found.  Assessment & Plan:   Walker Kehr, MD

## 2020-06-02 NOTE — Assessment & Plan Note (Signed)
Chronic  Pravastatin

## 2020-06-02 NOTE — Assessment & Plan Note (Signed)
Wt Readings from Last 3 Encounters:  06/02/20 258 lb (117 kg)  02/28/20 236 lb 3.2 oz (107.1 kg)  08/28/19 271 lb (122.9 kg)

## 2020-06-11 DIAGNOSIS — G4733 Obstructive sleep apnea (adult) (pediatric): Secondary | ICD-10-CM | POA: Diagnosis not present

## 2020-06-16 DIAGNOSIS — L570 Actinic keratosis: Secondary | ICD-10-CM | POA: Diagnosis not present

## 2020-06-16 DIAGNOSIS — Z85820 Personal history of malignant melanoma of skin: Secondary | ICD-10-CM | POA: Diagnosis not present

## 2020-06-16 DIAGNOSIS — C4442 Squamous cell carcinoma of skin of scalp and neck: Secondary | ICD-10-CM | POA: Diagnosis not present

## 2020-08-04 DIAGNOSIS — E7800 Pure hypercholesterolemia, unspecified: Secondary | ICD-10-CM | POA: Diagnosis not present

## 2020-08-04 DIAGNOSIS — I10 Essential (primary) hypertension: Secondary | ICD-10-CM | POA: Diagnosis not present

## 2020-08-04 DIAGNOSIS — E1165 Type 2 diabetes mellitus with hyperglycemia: Secondary | ICD-10-CM | POA: Diagnosis not present

## 2020-08-04 DIAGNOSIS — R609 Edema, unspecified: Secondary | ICD-10-CM | POA: Diagnosis not present

## 2020-08-19 ENCOUNTER — Other Ambulatory Visit (INDEPENDENT_AMBULATORY_CARE_PROVIDER_SITE_OTHER): Payer: Medicare Other

## 2020-08-19 DIAGNOSIS — E1165 Type 2 diabetes mellitus with hyperglycemia: Secondary | ICD-10-CM | POA: Diagnosis not present

## 2020-08-19 DIAGNOSIS — Z0000 Encounter for general adult medical examination without abnormal findings: Secondary | ICD-10-CM | POA: Diagnosis not present

## 2020-08-19 DIAGNOSIS — E785 Hyperlipidemia, unspecified: Secondary | ICD-10-CM | POA: Diagnosis not present

## 2020-08-19 DIAGNOSIS — Z794 Long term (current) use of insulin: Secondary | ICD-10-CM | POA: Diagnosis not present

## 2020-08-19 LAB — TSH: TSH: 3.46 u[IU]/mL (ref 0.35–4.50)

## 2020-08-19 LAB — COMPREHENSIVE METABOLIC PANEL
ALT: 14 U/L (ref 0–53)
AST: 14 U/L (ref 0–37)
Albumin: 3.7 g/dL (ref 3.5–5.2)
Alkaline Phosphatase: 79 U/L (ref 39–117)
BUN: 38 mg/dL — ABNORMAL HIGH (ref 6–23)
CO2: 27 mEq/L (ref 19–32)
Calcium: 9.1 mg/dL (ref 8.4–10.5)
Chloride: 102 mEq/L (ref 96–112)
Creatinine, Ser: 1.9 mg/dL — ABNORMAL HIGH (ref 0.40–1.50)
GFR: 35.43 mL/min — ABNORMAL LOW (ref >60.00)
Glucose, Bld: 137 mg/dL — ABNORMAL HIGH (ref 70–99)
Potassium: 4.2 mEq/L (ref 3.5–5.1)
Sodium: 138 mEq/L (ref 135–145)
Total Bilirubin: 0.4 mg/dL (ref 0.2–1.2)
Total Protein: 7.3 g/dL (ref 6.0–8.3)

## 2020-08-19 LAB — MICROALBUMIN / CREATININE URINE RATIO
Creatinine,U: 84.1 mg/dL
Microalb Creat Ratio: 216.5 mg/g — ABNORMAL HIGH (ref 0.0–30.0)
Microalb, Ur: 182.1 mg/dL — ABNORMAL HIGH (ref 0.0–1.9)

## 2020-08-19 LAB — LIPID PANEL
Cholesterol: 165 mg/dL (ref 0–200)
HDL: 44.6 mg/dL (ref >39.00)
LDL Cholesterol: 98 mg/dL (ref 0–99)
NonHDL: 120.61
Total CHOL/HDL Ratio: 4
Triglycerides: 111 mg/dL (ref 0.0–149.0)
VLDL: 22.2 mg/dL (ref 0.0–40.0)

## 2020-08-19 LAB — T4, FREE: Free T4: 0.62 ng/dL (ref 0.60–1.60)

## 2020-08-21 DIAGNOSIS — C4442 Squamous cell carcinoma of skin of scalp and neck: Secondary | ICD-10-CM | POA: Diagnosis not present

## 2020-08-21 DIAGNOSIS — L570 Actinic keratosis: Secondary | ICD-10-CM | POA: Diagnosis not present

## 2020-08-29 ENCOUNTER — Other Ambulatory Visit: Payer: Self-pay

## 2020-09-01 ENCOUNTER — Encounter: Payer: Self-pay | Admitting: Internal Medicine

## 2020-09-01 ENCOUNTER — Ambulatory Visit (INDEPENDENT_AMBULATORY_CARE_PROVIDER_SITE_OTHER): Payer: Medicare Other | Admitting: Internal Medicine

## 2020-09-01 ENCOUNTER — Other Ambulatory Visit: Payer: Self-pay

## 2020-09-01 DIAGNOSIS — I700 Atherosclerosis of aorta: Secondary | ICD-10-CM

## 2020-09-01 DIAGNOSIS — G4733 Obstructive sleep apnea (adult) (pediatric): Secondary | ICD-10-CM

## 2020-09-01 DIAGNOSIS — E1165 Type 2 diabetes mellitus with hyperglycemia: Secondary | ICD-10-CM | POA: Diagnosis not present

## 2020-09-01 DIAGNOSIS — U071 COVID-19: Secondary | ICD-10-CM

## 2020-09-01 DIAGNOSIS — I10 Essential (primary) hypertension: Secondary | ICD-10-CM | POA: Diagnosis not present

## 2020-09-01 DIAGNOSIS — Z794 Long term (current) use of insulin: Secondary | ICD-10-CM

## 2020-09-01 MED ORDER — METOPROLOL SUCCINATE ER 25 MG PO TB24: 25.0000 mg | ORAL_TABLET | Freq: Two times a day (BID) | ORAL | 3 refills | Status: DC

## 2020-09-01 MED ORDER — CLONIDINE HCL 0.1 MG PO TABS
0.1000 mg | ORAL_TABLET | Freq: Three times a day (TID) | ORAL | 2 refills | Status: DC | PRN
Start: 2020-09-01 — End: 2020-11-26

## 2020-09-01 MED ORDER — GABAPENTIN 600 MG PO TABS: 600.0000 mg | ORAL_TABLET | Freq: Two times a day (BID) | ORAL | 3 refills | Status: DC

## 2020-09-01 NOTE — Assessment & Plan Note (Signed)
loosing wt on Ozempic

## 2020-09-01 NOTE — Assessment & Plan Note (Signed)
On Pravastatin, diet

## 2020-09-01 NOTE — Assessment & Plan Note (Addendum)
Toujeo, Jardiance, Ozempic

## 2020-09-01 NOTE — Assessment & Plan Note (Signed)
Toujeo, Jardiance, Ozempic

## 2020-09-01 NOTE — Assessment & Plan Note (Signed)
F/u w/Dr Alva 

## 2020-09-01 NOTE — Progress Notes (Signed)
Subjective:  Patient ID: [name] [name], male    DOB: 1951/02/17  Age: 70 y.o. MRN: [id number]  CC: Follow-up (3 month f/u- pt states his BP has been running high. Dentist want do procedure due to BP) and Medication Refill (Req refill on Gabapentin which was rx by another provider)   HPI Kern Reap presents for SBP>180 at his dentist office. SBP 150s at home F/u OSA F/u DM, obesity - loosing wt on Ozempic   Outpatient Medications Prior to Visit  Medication Sig Dispense Refill  . amLODipine (NORVASC) 5 MG tablet Take 1 tablet (5 mg total) by mouth daily. 90 tablet 3  . aspirin 81 MG tablet Take 81 mg by mouth daily.    . B-D ULTRAFINE III SHORT PEN 31G X 8 MM MISC USE AS DIRECTED 200 each 3  . [place]-Glucosamine-Vit D (OSTEO BI-FLEX ONE PER DAY PO) Take 2 tablets by mouth daily.    . cholecalciferol (VITAMIN D) 1000 units tablet Take 3,000 Units by mouth daily.    . empagliflozin (JARDIANCE) 10 MG TABS tablet Take 1 tablet (10 mg total) by mouth daily. 90 tablet 3  . fexofenadine (ALLEGRA) 180 MG tablet Take 180 mg by mouth daily.    . furosemide (LASIX) 40 MG tablet Take 1 tablet (40 mg total) by mouth daily as needed. (Patient taking differently: Take 40 mg by mouth daily as needed for fluid.) 90 tablet 1  . gabapentin (NEURONTIN) 600 MG tablet Take 1 tablet (600 mg total) by mouth 2 (two) times daily. 180 tablet 3  . insulin glargine, 1 Unit Dial, (TOUJEO) 300 UNIT/ML Solostar Pen Inject 60 Units into the skin daily before breakfast.     . losartan (COZAAR) 100 MG tablet Take 1 tablet (100 mg total) by mouth daily. 90 tablet 3  . metoprolol succinate (TOPROL-XL) 25 MG 24 hr tablet Take 1 tablet (25 mg total) by mouth daily. 90 tablet 3  . ondansetron (ZOFRAN ODT) 4 MG disintegrating tablet Take 1 tablet (4 mg total) by mouth every 8 (eight) hours as needed for nausea or vomiting. 20 tablet 0  . OZEMPIC, 0.25 OR 0.5 MG/DOSE, 2 MG/1.5ML SOPN Inject 0.5 mg into the skin once a  week.    . pravastatin (PRAVACHOL) 40 MG tablet Take 1 tablet (40 mg total) by mouth daily. 90 tablet 3  . promethazine (PHENERGAN) 25 MG tablet Take 25 mg by mouth every 6 (six) hours as needed for nausea or vomiting.    . sodium fluoride (PREVIDENT 5000 DRY MOUTH) 1.1 % GEL dental gel Place 1 application onto teeth at bedtime. 120 mL 11  . tamsulosin (FLOMAX) 0.4 MG CAPS capsule Take 1 capsule (0.4 mg total) by mouth daily after supper. 90 capsule 3  . chlorpheniramine-HYDROcodone (TUSSIONEX PENNKINETIC ER) 10-8 MG/5ML SUER Take 5 mLs by mouth every 12 (twelve) hours as needed for cough. (Patient not taking: Reported on 09/01/2020) 230 mL 0   No facility-administered medications prior to visit.    ROS: Review of Systems  Constitutional: Negative for appetite change, fatigue and unexpected weight change.  HENT: Negative for congestion, nosebleeds, sneezing, sore throat and trouble swallowing.   Eyes: Negative for itching and visual disturbance.  Respiratory: Negative for cough.   Cardiovascular: Negative for chest pain, palpitations and leg swelling.  Gastrointestinal: Negative for abdominal distention, blood in stool, diarrhea and nausea.  Genitourinary: Negative for frequency and hematuria.  Musculoskeletal: Positive for arthralgias and gait problem. Negative for back pain, joint swelling and  neck pain.  Skin: Negative for rash.  Neurological: Negative for dizziness, tremors, speech difficulty and weakness.  Psychiatric/Behavioral: Negative for agitation, dysphoric mood, sleep disturbance and suicidal ideas. The patient is not nervous/anxious.     Objective:  BP (!) 152/70 (BP Location: Left Arm)   Pulse 73   Temp 98.9 F (37.2 C) (Oral)   Ht 5\' 9"  (1.753 m)   Wt 252 lb 3.2 oz (114.4 kg)   SpO2 96%   BMI 37.24 kg/m   BP Readings from Last 3 Encounters:  09/01/20 (!) 152/70  06/02/20 (!) 158/86  02/28/20 (!) 142/68    Wt Readings from Last 3 Encounters:  09/01/20 252 lb  3.2 oz (114.4 kg)  06/02/20 258 lb (117 kg)  02/28/20 236 lb 3.2 oz (107.1 kg)    Physical Exam Constitutional:      General: He is not in acute distress.    Appearance: He is well-developed. He is obese.     Comments: NAD  Eyes:     Conjunctiva/sclera: Conjunctivae normal.     Pupils: Pupils are equal, round, and reactive to light.  Neck:     Thyroid: No thyromegaly.     Vascular: No JVD.  Cardiovascular:     Rate and Rhythm: Normal rate and regular rhythm.     Heart sounds: Normal heart sounds. No murmur heard. No friction rub. No gallop.   Pulmonary:     Effort: Pulmonary effort is normal. No respiratory distress.     Breath sounds: Normal breath sounds. No wheezing or rales.  Chest:     Chest wall: No tenderness.  Abdominal:     General: Bowel sounds are normal. There is no distension.     Palpations: Abdomen is soft. There is no mass.     Tenderness: There is no abdominal tenderness. There is no guarding or rebound.  Musculoskeletal:        General: No tenderness. Normal range of motion.     Cervical back: Normal range of motion.  Lymphadenopathy:     Cervical: No cervical adenopathy.  Skin:    General: Skin is warm and dry.     Findings: No rash.  Neurological:     Mental Status: He is alert and oriented to person, place, and time.     Cranial Nerves: No cranial nerve deficit.     Motor: No abnormal muscle tone.     Coordination: Coordination normal.     Gait: Gait normal.     Deep Tendon Reflexes: Reflexes are normal and symmetric.  Psychiatric:        Behavior: Behavior normal.        Thought Content: Thought content normal.        Judgment: Judgment normal.   scabs on scalp  Lab Results  Component Value Date   WBC 6.1 09/25/2019   HGB 13.9 09/25/2019   HCT 40.8 09/25/2019   PLT 262.0 09/25/2019   GLUCOSE 137 (H) 08/19/2020   CHOL 165 08/19/2020   TRIG 111.0 08/19/2020   HDL 44.60 08/19/2020   LDLDIRECT 200.0 02/17/2016   LDLCALC 98 08/19/2020    ALT 14 08/19/2020   AST 14 08/19/2020   NA 138 08/19/2020   K 4.2 08/19/2020   CL 102 08/19/2020   CREATININE 1.90 (H) 08/19/2020   BUN 38 (H) 08/19/2020   CO2 27 08/19/2020   TSH 3.46 08/19/2020   PSA 0.62 09/25/2019   INR 1.08 02/02/2011   HGBA1C 8.9 (H) 09/25/2019  MICROALBUR 182.1 (H) 08/19/2020    No results found.  Assessment & Plan:    Walker Kehr, MD

## 2020-09-08 DIAGNOSIS — G4733 Obstructive sleep apnea (adult) (pediatric): Secondary | ICD-10-CM | POA: Diagnosis not present

## 2020-09-17 DIAGNOSIS — N132 Hydronephrosis with renal and ureteral calculous obstruction: Secondary | ICD-10-CM | POA: Diagnosis not present

## 2020-09-17 DIAGNOSIS — N200 Calculus of kidney: Secondary | ICD-10-CM | POA: Diagnosis not present

## 2020-09-17 DIAGNOSIS — Z87891 Personal history of nicotine dependence: Secondary | ICD-10-CM | POA: Diagnosis not present

## 2020-09-17 DIAGNOSIS — E860 Dehydration: Secondary | ICD-10-CM | POA: Diagnosis not present

## 2020-09-17 DIAGNOSIS — K5730 Diverticulosis of large intestine without perforation or abscess without bleeding: Secondary | ICD-10-CM | POA: Diagnosis not present

## 2020-09-17 DIAGNOSIS — E119 Type 2 diabetes mellitus without complications: Secondary | ICD-10-CM | POA: Diagnosis not present

## 2020-09-17 DIAGNOSIS — Z20822 Contact with and (suspected) exposure to covid-19: Secondary | ICD-10-CM | POA: Diagnosis not present

## 2020-09-17 DIAGNOSIS — I10 Essential (primary) hypertension: Secondary | ICD-10-CM | POA: Diagnosis not present

## 2020-09-17 DIAGNOSIS — Z7984 Long term (current) use of oral hypoglycemic drugs: Secondary | ICD-10-CM | POA: Diagnosis not present

## 2020-09-17 DIAGNOSIS — N134 Hydroureter: Secondary | ICD-10-CM | POA: Diagnosis not present

## 2020-09-17 DIAGNOSIS — E1165 Type 2 diabetes mellitus with hyperglycemia: Secondary | ICD-10-CM | POA: Diagnosis not present

## 2020-09-18 DIAGNOSIS — R1011 Right upper quadrant pain: Secondary | ICD-10-CM | POA: Insufficient documentation

## 2020-09-18 DIAGNOSIS — Z794 Long term (current) use of insulin: Secondary | ICD-10-CM | POA: Insufficient documentation

## 2020-09-19 DIAGNOSIS — E1165 Type 2 diabetes mellitus with hyperglycemia: Secondary | ICD-10-CM | POA: Insufficient documentation

## 2020-10-13 DIAGNOSIS — L929 Granulomatous disorder of the skin and subcutaneous tissue, unspecified: Secondary | ICD-10-CM | POA: Diagnosis not present

## 2020-10-13 DIAGNOSIS — Z85820 Personal history of malignant melanoma of skin: Secondary | ICD-10-CM | POA: Diagnosis not present

## 2020-10-13 DIAGNOSIS — D485 Neoplasm of uncertain behavior of skin: Secondary | ICD-10-CM | POA: Diagnosis not present

## 2020-10-27 DIAGNOSIS — D1801 Hemangioma of skin and subcutaneous tissue: Secondary | ICD-10-CM | POA: Diagnosis not present

## 2020-11-03 ENCOUNTER — Ambulatory Visit (INDEPENDENT_AMBULATORY_CARE_PROVIDER_SITE_OTHER): Payer: Medicare Other | Admitting: Internal Medicine

## 2020-11-03 ENCOUNTER — Encounter: Payer: Self-pay | Admitting: Internal Medicine

## 2020-11-03 ENCOUNTER — Other Ambulatory Visit: Payer: Self-pay

## 2020-11-03 DIAGNOSIS — IMO0002 Reserved for concepts with insufficient information to code with codable children: Secondary | ICD-10-CM

## 2020-11-03 DIAGNOSIS — E1129 Type 2 diabetes mellitus with other diabetic kidney complication: Secondary | ICD-10-CM

## 2020-11-03 DIAGNOSIS — E785 Hyperlipidemia, unspecified: Secondary | ICD-10-CM

## 2020-11-03 DIAGNOSIS — E1165 Type 2 diabetes mellitus with hyperglycemia: Secondary | ICD-10-CM

## 2020-11-03 DIAGNOSIS — N289 Disorder of kidney and ureter, unspecified: Secondary | ICD-10-CM

## 2020-11-03 DIAGNOSIS — K429 Umbilical hernia without obstruction or gangrene: Secondary | ICD-10-CM

## 2020-11-03 DIAGNOSIS — I10 Essential (primary) hypertension: Secondary | ICD-10-CM

## 2020-11-03 NOTE — Assessment & Plan Note (Signed)
Pt lost wt on Ozempic

## 2020-11-03 NOTE — Assessment & Plan Note (Signed)
Toprol XL, ASA, Amlodipine, Losartan

## 2020-11-03 NOTE — Assessment & Plan Note (Signed)
On Pravastatin 

## 2020-11-03 NOTE — Assessment & Plan Note (Signed)
Worse. Surgical consult

## 2020-11-03 NOTE — Assessment & Plan Note (Addendum)
  Discussed Hydrate well

## 2020-11-03 NOTE — Progress Notes (Signed)
Subjective:  Patient ID: [name] [name], male    DOB: 1950-10-02  Age: 70 y.o. MRN: [id number]  CC: Follow-up (2 month f/u)   HPI Kern Reap presents for DM, HTN, CAD, CRF f/u Pt lost wt on diet C/o feeling nauseated in am Pt lost wt on Ozempic Larry stopped baby ASA due to bruising   Outpatient Medications Prior to Visit  Medication Sig Dispense Refill   amLODipine (NORVASC) 5 MG tablet Take 1 tablet (5 mg total) by mouth daily. 90 tablet 3   aspirin 81 MG tablet Take 81 mg by mouth daily.     B-D ULTRAFINE III SHORT PEN 31G X 8 MM MISC USE AS DIRECTED 200 each 3   [place]-Glucosamine-Vit D (OSTEO BI-FLEX ONE PER DAY PO) Take 2 tablets by mouth daily.     cholecalciferol (VITAMIN D) 1000 units tablet Take 3,000 Units by mouth daily.     cloNIDine (CATAPRES) 0.1 MG tablet Take 1 tablet (0.1 mg total) by mouth 3 (three) times daily as needed. Take if systolic QV>956 prn 90 tablet 2   empagliflozin (JARDIANCE) 10 MG TABS tablet Take 1 tablet (10 mg total) by mouth daily. 90 tablet 3   fexofenadine (ALLEGRA) 180 MG tablet Take 180 mg by mouth daily.     furosemide (LASIX) 40 MG tablet Take 1 tablet (40 mg total) by mouth daily as needed. (Patient taking differently: Take 40 mg by mouth daily as needed for fluid.) 90 tablet 1   gabapentin (NEURONTIN) 600 MG tablet Take 1 tablet (600 mg total) by mouth 2 (two) times daily. 180 tablet 3   insulin glargine, 1 Unit Dial, (TOUJEO) 300 UNIT/ML Solostar Pen Inject 60 Units into the skin daily before breakfast.      losartan (COZAAR) 100 MG tablet Take 1 tablet (100 mg total) by mouth daily. 90 tablet 3   metoprolol succinate (TOPROL-XL) 25 MG 24 hr tablet Take 1 tablet (25 mg total) by mouth in the morning and at bedtime. 180 tablet 3   ondansetron (ZOFRAN ODT) 4 MG disintegrating tablet Take 1 tablet (4 mg total) by mouth every 8 (eight) hours as needed for nausea or vomiting. 20 tablet 0   OZEMPIC, 0.25 OR 0.5 MG/DOSE, 2 MG/1.5ML SOPN  Inject 0.5 mg into the skin once a week.     pravastatin (PRAVACHOL) 40 MG tablet Take 1 tablet (40 mg total) by mouth daily. 90 tablet 3   promethazine (PHENERGAN) 25 MG tablet Take 25 mg by mouth every 6 (six) hours as needed for nausea or vomiting.     sodium fluoride (PREVIDENT 5000 DRY MOUTH) 1.1 % GEL dental gel Place 1 application onto teeth at bedtime. 120 mL 11   tamsulosin (FLOMAX) 0.4 MG CAPS capsule Take 1 capsule (0.4 mg total) by mouth daily after supper. 90 capsule 3   No facility-administered medications prior to visit.    ROS: Review of Systems  Constitutional:  Positive for fatigue and unexpected weight change. Negative for appetite change.  HENT:  Negative for congestion, nosebleeds, sneezing, sore throat and trouble swallowing.   Eyes:  Negative for itching and visual disturbance.  Respiratory:  Negative for cough.   Cardiovascular:  Negative for chest pain, palpitations and leg swelling.  Gastrointestinal:  Positive for nausea. Negative for abdominal distention, blood in stool and diarrhea.  Genitourinary:  Negative for frequency and hematuria.  Musculoskeletal:  Negative for back pain, gait problem, joint swelling and neck pain.  Skin:  Negative for rash.  Neurological:  Negative for dizziness, tremors, speech difficulty and weakness.  Psychiatric/Behavioral:  Negative for agitation, dysphoric mood, sleep disturbance and suicidal ideas. The patient is not nervous/anxious.    Objective:  BP (!) 148/78 (BP Location: Left Arm)   Pulse 74   Temp 98.2 F (36.8 C) (Oral)   Ht 5\' 9"  (1.753 m)   Wt 247 lb 12.8 oz (112.4 kg)   SpO2 97%   BMI 36.59 kg/m   BP Readings from Last 3 Encounters:  11/03/20 (!) 148/78  09/01/20 (!) 152/70  06/02/20 (!) 158/86    Wt Readings from Last 3 Encounters:  11/03/20 247 lb 12.8 oz (112.4 kg)  09/01/20 252 lb 3.2 oz (114.4 kg)  06/02/20 258 lb (117 kg)    Physical Exam Constitutional:      General: He is not in acute  distress.    Appearance: He is well-developed. He is obese.     Comments: NAD  Eyes:     Conjunctiva/sclera: Conjunctivae normal.     Pupils: Pupils are equal, round, and reactive to light.  Neck:     Thyroid: No thyromegaly.     Vascular: No JVD.  Cardiovascular:     Rate and Rhythm: Normal rate and regular rhythm.     Heart sounds: Normal heart sounds. No murmur heard.   No friction rub. No gallop.  Pulmonary:     Effort: Pulmonary effort is normal. No respiratory distress.     Breath sounds: Normal breath sounds. No wheezing or rales.  Chest:     Chest wall: No tenderness.  Abdominal:     General: Bowel sounds are normal. There is no distension.     Palpations: Abdomen is soft. There is no mass.     Tenderness: There is no abdominal tenderness. There is no guarding or rebound.  Musculoskeletal:        General: No tenderness. Normal range of motion.     Cervical back: Normal range of motion.  Lymphadenopathy:     Cervical: No cervical adenopathy.  Skin:    General: Skin is warm and dry.     Findings: No rash.  Neurological:     Mental Status: He is alert and oriented to person, place, and time.     Cranial Nerves: No cranial nerve deficit.     Motor: No abnormal muscle tone.     Coordination: Coordination normal.     Gait: Gait normal.     Deep Tendon Reflexes: Reflexes are normal and symmetric.  Psychiatric:        Behavior: Behavior normal.        Thought Content: Thought content normal.        Judgment: Judgment normal.    Lab Results  Component Value Date   WBC 6.1 09/25/2019   HGB 13.9 09/25/2019   HCT 40.8 09/25/2019   PLT 262.0 09/25/2019   GLUCOSE 137 (H) 08/19/2020   CHOL 165 08/19/2020   TRIG 111.0 08/19/2020   HDL 44.60 08/19/2020   LDLDIRECT 200.0 02/17/2016   LDLCALC 98 08/19/2020   ALT 14 08/19/2020   AST 14 08/19/2020   NA 138 08/19/2020   K 4.2 08/19/2020   CL 102 08/19/2020   CREATININE 1.90 (H) 08/19/2020   BUN 38 (H) 08/19/2020   CO2  27 08/19/2020   TSH 3.46 08/19/2020   PSA 0.62 09/25/2019   INR 1.08 02/02/2011   HGBA1C 8.9 (H) 09/25/2019   MICROALBUR 182.1 (H) 08/19/2020    No results  found.  Assessment & Plan:     Walker Kehr, MD

## 2020-11-03 NOTE — Assessment & Plan Note (Signed)
Toujeo, Jardiance, Ozempic

## 2020-11-26 ENCOUNTER — Other Ambulatory Visit: Payer: Self-pay | Admitting: Internal Medicine

## 2020-12-09 DIAGNOSIS — G4733 Obstructive sleep apnea (adult) (pediatric): Secondary | ICD-10-CM | POA: Diagnosis not present

## 2021-01-09 DIAGNOSIS — K420 Umbilical hernia with obstruction, without gangrene: Secondary | ICD-10-CM | POA: Diagnosis not present

## 2021-01-14 DIAGNOSIS — E7800 Pure hypercholesterolemia, unspecified: Secondary | ICD-10-CM | POA: Diagnosis not present

## 2021-01-14 DIAGNOSIS — E1165 Type 2 diabetes mellitus with hyperglycemia: Secondary | ICD-10-CM | POA: Diagnosis not present

## 2021-01-21 ENCOUNTER — Encounter (HOSPITAL_COMMUNITY): Payer: Self-pay

## 2021-02-03 ENCOUNTER — Ambulatory Visit: Payer: Medicare Other | Admitting: Internal Medicine

## 2021-02-04 ENCOUNTER — Encounter: Payer: Self-pay | Admitting: Internal Medicine

## 2021-02-04 ENCOUNTER — Ambulatory Visit (INDEPENDENT_AMBULATORY_CARE_PROVIDER_SITE_OTHER): Payer: Medicare Other | Admitting: Internal Medicine

## 2021-02-04 ENCOUNTER — Other Ambulatory Visit: Payer: Self-pay

## 2021-02-04 VITALS — BP 132/70 | HR 65 | Temp 97.9°F | Ht 69.0 in | Wt 251.6 lb

## 2021-02-04 DIAGNOSIS — N320 Bladder-neck obstruction: Secondary | ICD-10-CM | POA: Diagnosis not present

## 2021-02-04 DIAGNOSIS — R0609 Other forms of dyspnea: Secondary | ICD-10-CM

## 2021-02-04 DIAGNOSIS — R609 Edema, unspecified: Secondary | ICD-10-CM | POA: Diagnosis not present

## 2021-02-04 DIAGNOSIS — R0600 Dyspnea, unspecified: Secondary | ICD-10-CM | POA: Diagnosis not present

## 2021-02-04 DIAGNOSIS — IMO0002 Reserved for concepts with insufficient information to code with codable children: Secondary | ICD-10-CM

## 2021-02-04 DIAGNOSIS — R059 Cough, unspecified: Secondary | ICD-10-CM

## 2021-02-04 DIAGNOSIS — E1165 Type 2 diabetes mellitus with hyperglycemia: Secondary | ICD-10-CM | POA: Diagnosis not present

## 2021-02-04 DIAGNOSIS — E1129 Type 2 diabetes mellitus with other diabetic kidney complication: Secondary | ICD-10-CM

## 2021-02-04 DIAGNOSIS — Z23 Encounter for immunization: Secondary | ICD-10-CM | POA: Diagnosis not present

## 2021-02-04 DIAGNOSIS — E7800 Pure hypercholesterolemia, unspecified: Secondary | ICD-10-CM | POA: Diagnosis not present

## 2021-02-04 DIAGNOSIS — I10 Essential (primary) hypertension: Secondary | ICD-10-CM | POA: Diagnosis not present

## 2021-02-04 DIAGNOSIS — N289 Disorder of kidney and ureter, unspecified: Secondary | ICD-10-CM | POA: Diagnosis not present

## 2021-02-04 DIAGNOSIS — E785 Hyperlipidemia, unspecified: Secondary | ICD-10-CM | POA: Diagnosis not present

## 2021-02-04 DIAGNOSIS — L821 Other seborrheic keratosis: Secondary | ICD-10-CM

## 2021-02-04 LAB — CBC WITH DIFFERENTIAL/PLATELET
Basophils Absolute: 0.1 10*3/uL (ref 0.0–0.1)
Basophils Relative: 0.7 % (ref 0.0–3.0)
Eosinophils Absolute: 0.4 10*3/uL (ref 0.0–0.7)
Eosinophils Relative: 4.1 % (ref 0.0–5.0)
HCT: 43.5 % (ref 39.0–52.0)
Hemoglobin: 14.3 g/dL (ref 13.0–17.0)
Lymphocytes Relative: 22.3 % (ref 12.0–46.0)
Lymphs Abs: 2.4 10*3/uL (ref 0.7–4.0)
MCHC: 33 g/dL (ref 30.0–36.0)
MCV: 80.5 fl (ref 78.0–100.0)
Monocytes Absolute: 0.7 10*3/uL (ref 0.1–1.0)
Monocytes Relative: 6 % (ref 3.0–12.0)
Neutro Abs: 7.2 10*3/uL (ref 1.4–7.7)
Neutrophils Relative %: 66.9 % (ref 43.0–77.0)
Platelets: 202 10*3/uL (ref 150.0–400.0)
RBC: 5.41 Mil/uL (ref 4.22–5.81)
RDW: 14.7 % (ref 11.5–15.5)
WBC: 10.8 10*3/uL — ABNORMAL HIGH (ref 4.0–10.5)

## 2021-02-04 LAB — PSA: PSA: 0.73 ng/mL (ref 0.10–4.00)

## 2021-02-04 MED ORDER — HYDROCOD POLST-CPM POLST ER 10-8 MG/5ML PO SUER: 5.0000 mL | Freq: Two times a day (BID) | ORAL | 0 refills | Status: DC | PRN

## 2021-02-04 NOTE — Patient Instructions (Signed)
Try GLYTONE exfoliating body lotion by Desmond Dike (free acid value 17.5). You can see if there is a solution available

## 2021-02-04 NOTE — Assessment & Plan Note (Signed)
A1c was 7.6% Cont on Toujeo, [place], Ozempic

## 2021-02-04 NOTE — Assessment & Plan Note (Signed)
Scalp Try GLYTONE exfoliating body lotion by Desmond Dike (free acid value 17.5)

## 2021-02-04 NOTE — Assessment & Plan Note (Signed)
Recurrent Tussionex prn - rare use  Potential benefits of opioids use as well as potential risks (i.e. addiction risk, apnea etc) and complications (i.e. Somnolence, constipation and others) were explained to the patient and were aknowledged.

## 2021-02-04 NOTE — Progress Notes (Signed)
Subjective:  Patient ID: [name] [name], male    DOB: 28-Dec-1950  Age: 70 y.o. MRN: [id number]  CC: Follow-up (3 month f/u)   HPI [name] [name] presents for HTN, DM, OA f/u C/o intermittent severe cough  Outpatient Medications Prior to Visit  Medication Sig Dispense Refill   amLODipine (NORVASC) 5 MG tablet Take 1 tablet (5 mg total) by mouth daily. 90 tablet 3   B-D ULTRAFINE III SHORT PEN 31G X 8 MM MISC USE AS DIRECTED 200 each 3   cholecalciferol (VITAMIN D) 1000 units tablet Take 3,000 Units by mouth daily.     cloNIDine (CATAPRES) 0.1 MG tablet TAKE 1 TABLET BY MOUTH THREE TIMES DAILY AS NEEDED. TAKE IF SYSTOLIC BLOOD [id number] 90 tablet 2   empagliflozin (JARDIANCE) 10 MG TABS tablet Take 1 tablet (10 mg total) by mouth daily. 90 tablet 3   fexofenadine (ALLEGRA) 180 MG tablet Take 180 mg by mouth daily.     furosemide (LASIX) 40 MG tablet Take 1 tablet (40 mg total) by mouth daily as needed. (Patient taking differently: Take 40 mg by mouth daily as needed for fluid.) 90 tablet 1   gabapentin (NEURONTIN) 600 MG tablet Take 1 tablet (600 mg total) by mouth 2 (two) times daily. 180 tablet 3   insulin glargine, 1 Unit Dial, (TOUJEO) 300 UNIT/ML Solostar Pen Inject 60 Units into the skin daily before breakfast.      losartan (COZAAR) 100 MG tablet Take 1 tablet (100 mg total) by mouth daily. 90 tablet 3   metoprolol succinate (TOPROL-XL) 25 MG 24 hr tablet Take 1 tablet (25 mg total) by mouth in the morning and at bedtime. 180 tablet 3   OZEMPIC, 0.25 OR 0.5 MG/DOSE, 2 MG/1.5ML SOPN Inject 0.5 mg into the skin once a week.     pravastatin (PRAVACHOL) 40 MG tablet Take 1 tablet (40 mg total) by mouth daily. 90 tablet 3   promethazine (PHENERGAN) 25 MG tablet Take 25 mg by mouth every 6 (six) hours as needed for nausea or vomiting.     tamsulosin (FLOMAX) 0.4 MG CAPS capsule Take 1 capsule (0.4 mg total) by mouth daily after supper. 90 capsule 3   aspirin 81 MG tablet Take 81 mg  by mouth daily.     Boswellia-Glucosamine-Vit D (OSTEO BI-FLEX ONE PER DAY PO) Take 2 tablets by mouth daily.     ondansetron (ZOFRAN ODT) 4 MG disintegrating tablet Take 1 tablet (4 mg total) by mouth every 8 (eight) hours as needed for nausea or vomiting. (Patient not taking: Reported on 02/04/2021) 20 tablet 0   sodium fluoride (PREVIDENT 5000 DRY MOUTH) 1.1 % GEL dental gel Place 1 application onto teeth at bedtime. 120 mL 11   No facility-administered medications prior to visit.    ROS: Review of Systems  Constitutional:  Negative for appetite change, fatigue and unexpected weight change.  HENT:  Negative for congestion, nosebleeds, sneezing, sore throat and trouble swallowing.   Eyes:  Negative for itching and visual disturbance.  Respiratory:  Positive for cough.   Cardiovascular:  Negative for chest pain, palpitations and leg swelling.  Gastrointestinal:  Negative for abdominal distention, blood in stool, diarrhea and nausea.  Genitourinary:  Negative for frequency and hematuria.  Musculoskeletal:  Positive for arthralgias. Negative for back pain, gait problem, joint swelling and neck pain.  Skin:  Negative for rash.  Neurological:  Negative for dizziness, tremors, speech difficulty and weakness.  Psychiatric/Behavioral:  Negative for agitation, dysphoric mood, sleep  disturbance and suicidal ideas. The patient is not nervous/anxious.    Objective:  BP 132/70 (BP Location: Left Arm)   Pulse 65   Temp 97.9 F (36.6 C) (Oral)   Ht 5\' 9"  (1.753 m)   Wt 251 lb 9.6 oz (114.1 kg)   SpO2 96%   BMI 37.15 kg/m   BP Readings from Last 3 Encounters:  02/04/21 132/70  11/03/20 (!) 148/78  09/01/20 (!) 152/70    Wt Readings from Last 3 Encounters:  02/04/21 251 lb 9.6 oz (114.1 kg)  11/03/20 247 lb 12.8 oz (112.4 kg)  09/01/20 252 lb 3.2 oz (114.4 kg)    Physical Exam Constitutional:      General: He is not in acute distress.    Appearance: He is well-developed. He is obese.      Comments: NAD  Eyes:     Conjunctiva/sclera: Conjunctivae normal.     Pupils: Pupils are equal, round, and reactive to light.  Neck:     Thyroid: No thyromegaly.     Vascular: No JVD.  Cardiovascular:     Rate and Rhythm: Normal rate and regular rhythm.     Heart sounds: Normal heart sounds. No murmur heard.   No friction rub. No gallop.  Pulmonary:     Effort: Pulmonary effort is normal. No respiratory distress.     Breath sounds: Normal breath sounds. No wheezing or rales.  Chest:     Chest wall: No tenderness.  Abdominal:     General: Bowel sounds are normal. There is no distension.     Palpations: Abdomen is soft. There is no mass.     Tenderness: There is no abdominal tenderness. There is no guarding or rebound.     Hernia: A hernia is present.  Musculoskeletal:        General: No tenderness. Normal range of motion.     Cervical back: Normal range of motion.  Lymphadenopathy:     Cervical: No cervical adenopathy.  Skin:    General: Skin is warm and dry.     Findings: No rash.  Neurological:     Mental Status: He is alert and oriented to person, place, and time.     Cranial Nerves: No cranial nerve deficit.     Motor: No abnormal muscle tone.     Coordination: Coordination normal.     Gait: Gait normal.     Deep Tendon Reflexes: Reflexes are normal and symmetric.  Psychiatric:        Behavior: Behavior normal.        Thought Content: Thought content normal.        Judgment: Judgment normal.    Lab Results  Component Value Date   WBC 6.1 09/25/2019   HGB 13.9 09/25/2019   HCT 40.8 09/25/2019   PLT 262.0 09/25/2019   GLUCOSE 137 (H) 08/19/2020   CHOL 165 08/19/2020   TRIG 111.0 08/19/2020   HDL 44.60 08/19/2020   LDLDIRECT 200.0 02/17/2016   LDLCALC 98 08/19/2020   ALT 14 08/19/2020   AST 14 08/19/2020   NA 138 08/19/2020   K 4.2 08/19/2020   CL 102 08/19/2020   CREATININE 1.90 (H) 08/19/2020   BUN 38 (H) 08/19/2020   CO2 27 08/19/2020   TSH 3.46  08/19/2020   PSA 0.62 09/25/2019   INR 1.08 02/02/2011   HGBA1C 8.9 (H) 09/25/2019   MICROALBUR 182.1 (H) 08/19/2020    No results found.  Assessment & Plan:   Problem List Items  Addressed This Visit     Cough    Recurrent Tussionex prn - rare use  Potential benefits of opioids use as well as potential risks (i.e. addiction risk, apnea etc) and complications (i.e. Somnolence, constipation and others) were explained to the patient and were aknowledged.      DOE (dyspnea on exertion)   Relevant Orders   CBC with Differential/Platelet   Dyslipidemia    Chronic  Cont on Pravastatin      Obesity, morbid (HCC)    Wt Readings from Last 3 Encounters:  02/04/21 251 lb 9.6 oz (114.1 kg)  11/03/20 247 lb 12.8 oz (112.4 kg)  09/01/20 252 lb 3.2 oz (114.4 kg)  seeing Dr Chalmers Cater       Renal insufficiency    Monitor GFR Cont on Jardiance, Ozempic      Seborrheic keratoses    Scalp Try GLYTONE exfoliating body lotion by Desmond Dike (free acid value 17.5)      Uncontrolled type 2 diabetes with renal manifestation ([place])    A1c was 7.6% Cont on Toujeo, Jardiance, Ozempic      Other Visit Diagnoses     Needs flu shot    -  Primary   Relevant Orders   Flu Vaccine QUAD High Dose(Fluad) (Completed)   Bladder neck obstruction       Relevant Orders   PSA         Follow-up: Return in about 3 months (around 05/06/2021) for a follow-up visit.  Walker Kehr, MD

## 2021-02-04 NOTE — Assessment & Plan Note (Signed)
Wt Readings from Last 3 Encounters:  02/04/21 251 lb 9.6 oz (114.1 kg)  11/03/20 247 lb 12.8 oz (112.4 kg)  09/01/20 252 lb 3.2 oz (114.4 kg)  seeing Dr Chalmers Cater

## 2021-02-04 NOTE — Assessment & Plan Note (Signed)
Chronic Cont on Pravastatin 

## 2021-02-04 NOTE — Assessment & Plan Note (Signed)
Monitor GFR Cont on Jardiance, Ozempic

## 2021-02-20 NOTE — Progress Notes (Signed)
Sent message, via epic in basket, requesting orders in epic from surgeon.  

## 2021-02-23 ENCOUNTER — Ambulatory Visit: Payer: Self-pay | Admitting: Surgery

## 2021-02-24 NOTE — Patient Instructions (Addendum)
DUE TO COVID-19 ONLY ONE VISITOR IS ALLOWED TO COME WITH YOU AND STAY IN THE WAITING ROOM ONLY DURING PRE OP AND PROCEDURE DAY OF SURGERY IF YOU ARE GOING HOME AFTER SURGERY. IF YOU ARE SPENDING THE NIGHT 2 PEOPLE MAY VISIT WITH YOU IN YOUR PRIVATE ROOM AFTER SURGERY UNTIL VISITING  HOURS ARE OVER AT 800 PM AND THE 2 VISITORS CANNOT SPEND THE NIGHT.               [name] [name]     Your procedure is scheduled on: 03/13/21   Report to [facility] Main  Entrance   Report to Short Stay at 5:15AM     Call this number if you have problems the morning of surgery [phone]    Remember: Do not eat food  :After Midnight the night before your surgery,     You may have clear liquids from midnight until ---.4:30 am    CLEAR LIQUID DIET   Foods Allowed                                                                     Foods Excluded                                                                                            liquids that you cannot  Plain Jell-O any favor except red or purple                                           see through such as: Fruit ices (not with fruit pulp)                                     milk, soups, orange juice  Iced Popsicles                                    All solid food Carbonated beverages, regular and diet                                    Cranberry, grape and apple juices Sports drinks like Gatorade Lightly seasoned clear broth or consume(fat free) Sugar     BRUSH YOUR TEETH MORNING OF SURGERY AND RINSE YOUR MOUTH OUT, NO CHEWING GUM CANDY OR MINTS.     Take these medicines the morning of surgery with A SIP OF WATER: Gabapentin, Metoprolol, Amlodipine, Tamsulosin How to Manage Your Diabetes Before and After Surgery  Why is it important to control my blood sugar before and after surgery? Improving blood sugar  levels before and after surgery helps healing and can limit problems. A way of improving blood sugar control is eating a  healthy diet by:  Eating less sugar and carbohydrates  Increasing activity/exercise  Talking with your doctor about reaching your blood sugar goals High blood sugars (greater than 180 mg/dL) can raise your risk of infections and slow your recovery, so you will need to focus on controlling your diabetes during the weeks before surgery. Make sure that the doctor who takes care of your diabetes knows about your planned surgery including the date and location.  How do I manage my blood sugar before surgery? Check your blood sugar at least 4 times a day, starting 2 days before surgery, to make sure that the level is not too high or low. Check your blood sugar the morning of your surgery when you wake up and every 2 hours until you get to the Short Stay unit. If your blood sugar is less than 70 mg/dL, you will need to treat for low blood sugar: Do not take insulin. Treat a low blood sugar (less than 70 mg/dL) with  cup of clear juice (cranberry or apple), 4 glucose tablets, OR glucose gel. Recheck blood sugar in 15 minutes after treatment (to make sure it is greater than 70 mg/dL). If your blood sugar is not greater than 70 mg/dL on recheck, call [phone] for further instructions. Report your blood sugar to the short stay nurse when you get to Short Stay.  If you are admitted to the hospital after surgery: Your blood sugar will be checked by the staff and you will probably be given insulin after surgery (instead of oral diabetes medicines) to make sure you have good blood sugar levels. The goal for blood sugar control after surgery is 80-180 mg/dL.   WHAT DO I DO ABOUT MY DIABETES MEDICATION?  Do not take the Glimepride  or Jardiance the day before surgery.  Do not take oral diabetes medicines (pills) the morning of surgery.  THE NIGHT BEFORE SURGERY, take  0   units of  insulin.       THE MORNING OF SURGERY, take 22  units of  Toujeo    insulin.  The day of surgery, do not take other  diabetes injectables, including Byetta (exenatide), Bydureon (exenatide ER), Victoza (liraglutide), or Trulicity (dulaglutide).                                     You may not have any metal on your body including              piercings  Do not wear jewelry, lotions, powders or  deodorant             Men may shave face and neck.   Do not bring valuables to the hospital. [place].  Contacts, dentures or bridgework may not be worn into surgery.       Patients discharged the day of surgery will not be allowed to drive home.  IF YOU ARE HAVING SURGERY AND GOING HOME THE SAME DAY, YOU MUST HAVE AN ADULT TO DRIVE YOU HOME AND BE WITH YOU FOR 24 HOURS. YOU MAY GO HOME BY TAXI OR UBER OR ORTHERWISE, BUT AN ADULT MUST ACCOMPANY YOU HOME AND STAY WITH YOU FOR 24  HOURS.  Name and phone number of your driver:  Special Instructions: N/A              Please read over the following fact sheets you were given: _____________________________________________________________________             [facility] - Preparing for Surgery Before surgery, you can play an important role.  Because skin is not sterile, your skin needs to be as free of germs as possible.  You can reduce the number of germs on your skin by washing with CHG (chlorahexidine gluconate) soap before surgery.  CHG is an antiseptic cleaner which kills germs and bonds with the skin to continue killing germs even after washing. Please DO NOT use if you have an allergy to CHG or antibacterial soaps.  If your skin becomes reddened/irritated stop using the CHG and inform your nurse when you arrive at Short Stay.   You may shave your face/neck. Please follow these instructions carefully:  1.  Shower with CHG Soap the night before surgery and the  morning of Surgery.  2.  If you choose to wash your hair, wash your hair first as usual with your  normal  shampoo.  3.  After you shampoo, rinse your  hair and body thoroughly to remove the  shampoo.                            4.  Use CHG as you would any other liquid soap.  You can apply chg directly  to the skin and wash                       Gently with a scrungie or clean washcloth.  5.  Apply the CHG Soap to your body ONLY FROM THE NECK DOWN.   Do not use on face/ open                           Wound or open sores. Avoid contact with eyes, ears mouth and genitals (private parts).                       Wash face,  Genitals (private parts) with your normal soap.             6.  Wash thoroughly, paying special attention to the area where your surgery  will be performed.  7.  Thoroughly rinse your body with warm water from the neck down.  8.  DO NOT shower/wash with your normal soap after using and rinsing off  the CHG Soap.                9.  Pat yourself dry with a clean towel.            10.  Wear clean pajamas.            11.  Place clean sheets on your bed the night of your first shower and do not  sleep with pets. Day of Surgery : Do not apply any lotions/deodorants the morning of surgery.  Please wear clean clothes to the hospital/surgery center.  FAILURE TO FOLLOW THESE INSTRUCTIONS MAY RESULT IN THE CANCELLATION OF YOUR SURGERY PATIENT SIGNATURE_________________________________  NURSE SIGNATURE__________________________________  ________________________________________________________________________

## 2021-02-25 ENCOUNTER — Encounter (HOSPITAL_COMMUNITY)
Admission: RE | Admit: 2021-02-25 | Discharge: 2021-02-25 | Disposition: A | Discharge status: Home / Self Care | Payer: Medicare Other | Source: Ambulatory Visit | Attending: Surgery | Admitting: Surgery

## 2021-02-25 ENCOUNTER — Other Ambulatory Visit: Payer: Self-pay

## 2021-02-25 ENCOUNTER — Encounter (HOSPITAL_COMMUNITY): Payer: Self-pay

## 2021-02-25 DIAGNOSIS — E119 Type 2 diabetes mellitus without complications: Secondary | ICD-10-CM | POA: Diagnosis not present

## 2021-02-25 DIAGNOSIS — Z01812 Encounter for preprocedural laboratory examination: Secondary | ICD-10-CM | POA: Insufficient documentation

## 2021-02-25 HISTORY — DX: Malignant (primary) neoplasm, unspecified: C80.1

## 2021-02-25 HISTORY — DX: Chronic kidney disease, unspecified: N18.9

## 2021-02-25 HISTORY — DX: Myoneural disorder, unspecified: G70.9

## 2021-02-25 HISTORY — DX: Unspecified osteoarthritis, unspecified site: M19.90

## 2021-02-25 HISTORY — DX: Personal history of urinary calculi: Z87.442

## 2021-02-25 LAB — GLUCOSE, CAPILLARY: Glucose-Capillary: 147 mg/dL — ABNORMAL HIGH (ref 70–99)

## 2021-02-25 LAB — BASIC METABOLIC PANEL
Anion gap: 10 (ref 5–15)
BUN: 36 mg/dL — ABNORMAL HIGH (ref 8–23)
CO2: 25 mmol/L (ref 22–32)
Calcium: 8.8 mg/dL — ABNORMAL LOW (ref 8.9–10.3)
Chloride: 104 mmol/L (ref 98–111)
Creatinine, Ser: 1.54 mg/dL — ABNORMAL HIGH (ref 0.61–1.24)
GFR, Estimated: 48 mL/min — ABNORMAL LOW (ref >60)
Glucose, Bld: 143 mg/dL — ABNORMAL HIGH (ref 70–99)
Potassium: 4 mmol/L (ref 3.5–5.1)
Sodium: 139 mmol/L (ref 135–145)

## 2021-02-25 LAB — CBC
HCT: 41.9 % (ref 39.0–52.0)
Hemoglobin: 13.6 g/dL (ref 13.0–17.0)
MCH: 26.8 pg (ref 26.0–34.0)
MCHC: 32.5 g/dL (ref 30.0–36.0)
MCV: 82.5 fL (ref 80.0–100.0)
Platelets: 264 10*3/uL (ref 150–400)
RBC: 5.08 MIL/uL (ref 4.22–5.81)
RDW: 13.7 % (ref 11.5–15.5)
WBC: 6.6 10*3/uL (ref 4.0–10.5)
nRBC: 0 % (ref 0.0–0.2)

## 2021-02-25 LAB — HEMOGLOBIN A1C
Hgb A1c MFr Bld: 7.2 % — ABNORMAL HIGH (ref 4.8–5.6)
Mean Plasma Glucose: 159.94 mg/dL

## 2021-02-25 NOTE — Progress Notes (Signed)
COVID test -NA   PCP - Dr. Loni Muse. Plotnikov LOV 02/04/21 Endocrinologist- Dr. Cyd Silence LOV 02/04/21  Chest x-ray - no EKG - 02/25/21-chart Stress Test - no ECHO - no Cardiac Cath - no Pacemaker/ICD device last checked:NA  Sleep Study - yes CPAP - yes  Fasting Blood Sugar - 58-110 Checks Blood Sugar _____ times a day. Free style Libre Rt arm  Blood Thinner Instructions:NA Aspirin Instructions: Last Dose:  Anesthesia review: yes  Patient denies shortness of breath, fever, cough and chest pain at PAT appointment Pt can climb 2 flights of stairs, do housework and ADLs without SOB he has lost 35 lbs and will talk to his diabetic Dr. If his CBGs are running low  Patient verbalized understanding of instructions that were given to them at the PAT appointment. Patient was also instructed that they will need to review over the PAT instructions again at home before surgery. yes

## 2021-03-11 DIAGNOSIS — G4733 Obstructive sleep apnea (adult) (pediatric): Secondary | ICD-10-CM | POA: Diagnosis not present

## 2021-03-12 NOTE — H&P (Signed)
History of Present Illness: [name] [name] is a 70 y.o. male who is seen today as an office consultation at the request of Dr. Alain Marion for evaluation of New Patient .   Mr. Chanthavong comes in today having been referred by Dr. Cristie Hem Plotnick cough with a visible incarcerated umbilical hernia. He has had this for some time but is more visible because he may have lost weight. It has not been painful and I cannot get it rechecked to do to reduce.  I discussed repair with a laparoscopically assistance and with placement of a probably small Ventralex patch. I think this is probably bothering him a lot now and he wants to get it repaired so we will set this up electively. Would like to do it at [facility] under general anesthesia and do it as an outpatient.   Review of Systems: See HPI as well for other ROS.  ROS   Medical History: Past Medical History:  Diagnosis Date   Chronic kidney disease   Diabetes mellitus without complication (CMS-HCC)   Sleep apnea   There is no problem list on file for this patient.  No past surgical history on file.   Allergies  Allergen Reactions   Hydromorphone Other (See Comments)   Lisinopril Unknown  Cough   Penicillin G Other (See Comments)   Simvastatin Unknown  myalgia   Current Outpatient Medications on File Prior to Visit  Medication Sig Dispense Refill   amLODIPine (NORVASC) 5 MG tablet amlodipine Take (oral) 81771165 tablet No frequency recorded oral No set duration recorded No set duration amount recorded active 5 mg   empagliflozin (JARDIANCE) 10 mg tablet Jardiance Take (oral) [id number] tablet No frequency recorded oral No set duration recorded No set duration amount recorded active 10 mg   fluoride, sodium, 1.1 % dental gel Place onto teeth   gabapentin (NEURONTIN) 600 MG tablet gabapentin Take (oral) [id number] tablet No frequency recorded oral No set duration recorded No set duration amount recorded active 600 mg   glimepiride (AMARYL) 2 MG  tablet glimepiride Take (oral) [id number] tablet No frequency recorded oral No set duration recorded No set duration amount recorded active 2 mg   hydrocodone-chlorpheniramine (TUSSIONEX) 10-8 mg/5 mL ER suspension hydrocodone-chlorpheniramine Take (oral) [id number] suspension,extended rel 12 hr No frequency recorded oral No set duration recorded No set duration amount recorded active 10-8 mg/5 mL   insulin glargine U-300 conc (TOUJEO MAX U-300 SOLOSTAR) 300 unit/mL (3 mL) InPn Toujeo Max U-300 SoloStar Take (subcutaneous) [id number] insulin pen No frequency recorded subcutaneous No set duration recorded No set duration amount recorded active 300 unit/mL (3 mL)   losartan (COZAAR) 100 MG tablet losartan Take (oral) [id number] tablet No frequency recorded oral No set duration recorded No set duration amount recorded active 100 mg   metoprolol succinate (TOPROL-XL) 25 MG XL tablet metoprolol succinate Take (oral) [id number] tablet extended release 24 hr No frequency recorded oral No set duration recorded No set duration amount recorded active 25 mg   pravastatin (PRAVACHOL) 40 MG tablet pravastatin Take (oral) [id number] tablet No frequency recorded oral No set duration recorded No set duration amount recorded active 40 mg   semaglutide (OZEMPIC) 0.25 mg or 0.5 mg(2 mg/1.5 mL) pen injector Ozempic Take (subcutaneous) [id number] pen injector No frequency recorded subcutaneous No set duration recorded No set duration amount recorded active 0.25 mg or 0.5 mg(2 mg/1.5 mL)   tamsulosin (FLOMAX) 0.4 mg capsule tamsulosin Take (oral) [id number] capsule No frequency recorded oral No set duration recorded No  set duration amount recorded active 0.4 mg   cholecalciferol (VITAMIN D3) 1000 unit tablet Take by mouth   doxycycline (VIBRA-TABS) 100 MG tablet Take 100 mg by mouth once daily   FREESTYLE LIBRE 14 DAY SENSOR kit every 14 (fourteen) days   UNIFINE PENTIPS 32 gauge x 1/4" needle once daily AS DIRECTED   No current  facility-administered medications on file prior to visit.   Family History  Problem Relation Age of Onset   Breast cancer Mother   Obesity Father   High blood pressure (Hypertension) Father   Hyperlipidemia (Elevated cholesterol) Father   Heart valve disease Father    Social History   Tobacco Use  Smoking Status Never Smoker  Smokeless Tobacco Never Used    Social History   Socioeconomic History   Marital status: Married  Tobacco Use   Smoking status: Never Smoker   Smokeless tobacco: Never Used  Scientific laboratory technician Use: Never used  Substance and Sexual Activity   Alcohol use: Yes   Drug use: Never   Objective:   Vitals:  01/09/21 1341  BP: (!) 158/72  Pulse: 77  Temp: 36.8 C (98.2 F)  SpO2: 98%  Weight: (!) 114.9 kg (253 lb 3.2 oz)  Height: 175.3 cm ('5\' 9"' )   Body mass index is 37.39 kg/m.  Physical Exam General: Slightly obese white male no acute distress HEENT : Right sternocleidomastoid is noticeably smaller than the left but has had surgery on the right side for his neck. As result he has limited sensation in his fingers Chest: Clear to auscultation Heart: Sinus rhythm Breast: Not examined Abdomen: Protuberant abdomen with easily visible umbilical skin stretched out. This is nontender and nonreducible GU not performed Rectal Not performed Extremities full range of motion Neuro alert and oriented x3. Motor and sensory function grossly intact with the exception of the numbness in his hands.  Labs, Imaging and Diagnostic Testing: None to review  Assessment and Plan:  Diagnoses and all orders for this visit:  Incarcerated umbilical hernia   For laparoscopically assisted umbilical hernia repair  No follow-ups on file.  Rayleigh Gillyard Donia Pounds, MD

## 2021-03-13 ENCOUNTER — Ambulatory Visit (HOSPITAL_COMMUNITY): Payer: Medicare Other | Admitting: Emergency Medicine

## 2021-03-13 ENCOUNTER — Ambulatory Visit (HOSPITAL_COMMUNITY): Payer: Medicare Other | Admitting: Anesthesiology

## 2021-03-13 ENCOUNTER — Ambulatory Visit (HOSPITAL_COMMUNITY)
Admission: RE | Admit: 2021-03-13 | Discharge: 2021-03-13 | Disposition: A | Discharge status: Home / Self Care | Payer: Medicare Other | Attending: Surgery | Admitting: Surgery

## 2021-03-13 ENCOUNTER — Encounter (HOSPITAL_COMMUNITY): Payer: Self-pay | Admitting: Surgery

## 2021-03-13 ENCOUNTER — Encounter (HOSPITAL_COMMUNITY)
Admission: RE | Disposition: A | Discharge status: Home / Self Care | Payer: Self-pay | Source: Home / Self Care | Attending: Surgery

## 2021-03-13 DIAGNOSIS — Z880 Allergy status to penicillin: Secondary | ICD-10-CM | POA: Diagnosis not present

## 2021-03-13 DIAGNOSIS — Z8719 Personal history of other diseases of the digestive system: Secondary | ICD-10-CM

## 2021-03-13 DIAGNOSIS — N189 Chronic kidney disease, unspecified: Secondary | ICD-10-CM | POA: Insufficient documentation

## 2021-03-13 DIAGNOSIS — Z885 Allergy status to narcotic agent status: Secondary | ICD-10-CM | POA: Diagnosis not present

## 2021-03-13 DIAGNOSIS — G4733 Obstructive sleep apnea (adult) (pediatric): Secondary | ICD-10-CM | POA: Diagnosis not present

## 2021-03-13 DIAGNOSIS — Z888 Allergy status to other drugs, medicaments and biological substances status: Secondary | ICD-10-CM | POA: Diagnosis not present

## 2021-03-13 DIAGNOSIS — K420 Umbilical hernia with obstruction, without gangrene: Secondary | ICD-10-CM | POA: Insufficient documentation

## 2021-03-13 DIAGNOSIS — Z9989 Dependence on other enabling machines and devices: Secondary | ICD-10-CM | POA: Diagnosis not present

## 2021-03-13 DIAGNOSIS — Z79899 Other long term (current) drug therapy: Secondary | ICD-10-CM | POA: Insufficient documentation

## 2021-03-13 DIAGNOSIS — E1122 Type 2 diabetes mellitus with diabetic chronic kidney disease: Secondary | ICD-10-CM | POA: Diagnosis not present

## 2021-03-13 DIAGNOSIS — Z794 Long term (current) use of insulin: Secondary | ICD-10-CM | POA: Insufficient documentation

## 2021-03-13 DIAGNOSIS — Z7984 Long term (current) use of oral hypoglycemic drugs: Secondary | ICD-10-CM | POA: Insufficient documentation

## 2021-03-13 DIAGNOSIS — E785 Hyperlipidemia, unspecified: Secondary | ICD-10-CM | POA: Diagnosis not present

## 2021-03-13 HISTORY — PX: UMBILICAL HERNIA REPAIR: SHX196

## 2021-03-13 LAB — GLUCOSE, CAPILLARY
Glucose-Capillary: 114 mg/dL — ABNORMAL HIGH (ref 70–99)
Glucose-Capillary: 118 mg/dL — ABNORMAL HIGH (ref 70–99)

## 2021-03-13 SURGERY — REPAIR, HERNIA, UMBILICAL, LAPAROSCOPIC
Anesthesia: General

## 2021-03-13 MED ORDER — DEXAMETHASONE SODIUM PHOSPHATE 10 MG/ML IJ SOLN
INTRAMUSCULAR | Status: DC | PRN
  Administered 2021-03-13: 4 mg via INTRAVENOUS

## 2021-03-13 MED ORDER — KETAMINE HCL 10 MG/ML IJ SOLN
INTRAMUSCULAR | Status: DC | PRN
  Administered 2021-03-13: 30 mg via INTRAVENOUS

## 2021-03-13 MED ORDER — ONDANSETRON HCL 4 MG PO TABS: 4.0000 mg | ORAL_TABLET | Freq: Three times a day (TID) | ORAL | 0 refills | Status: AC | PRN

## 2021-03-13 MED ORDER — ONDANSETRON HCL 4 MG/2ML IJ SOLN
INTRAMUSCULAR | Status: DC | PRN
  Administered 2021-03-13: 4 mg via INTRAVENOUS

## 2021-03-13 MED ORDER — PHENYLEPHRINE 40 MCG/ML (10ML) SYRINGE FOR IV PUSH (FOR BLOOD PRESSURE SUPPORT)
PREFILLED_SYRINGE | INTRAVENOUS | Status: AC
  Filled 2021-03-13: qty 10

## 2021-03-13 MED ORDER — PROPOFOL 10 MG/ML IV BOLUS
INTRAVENOUS | Status: AC
  Filled 2021-03-13: qty 20

## 2021-03-13 MED ORDER — 0.9 % SODIUM CHLORIDE (POUR BTL) OPTIME
TOPICAL | Status: DC | PRN
  Administered 2021-03-13: 1000 mL

## 2021-03-13 MED ORDER — SCOPOLAMINE 1 MG/3DAYS TD PT72
1.0000 | MEDICATED_PATCH | TRANSDERMAL | Status: DC
  Administered 2021-03-13: 1.5 mg via TRANSDERMAL
  Filled 2021-03-13: qty 1

## 2021-03-13 MED ORDER — PROPOFOL 10 MG/ML IV BOLUS
INTRAVENOUS | Status: DC | PRN
  Administered 2021-03-13: 150 mg via INTRAVENOUS

## 2021-03-13 MED ORDER — LIDOCAINE HCL (CARDIAC) PF 100 MG/5ML IV SOSY
PREFILLED_SYRINGE | INTRAVENOUS | Status: DC | PRN
  Administered 2021-03-13: 40 mg via INTRAVENOUS
  Administered 2021-03-13: 60 mg via INTRAVENOUS

## 2021-03-13 MED ORDER — ROCURONIUM BROMIDE 100 MG/10ML IV SOLN
INTRAVENOUS | Status: DC | PRN
  Administered 2021-03-13: 10 mg via INTRAVENOUS
  Administered 2021-03-13: 60 mg via INTRAVENOUS

## 2021-03-13 MED ORDER — BUPIVACAINE LIPOSOME 1.3 % IJ SUSP
INTRAMUSCULAR | Status: DC | PRN
  Administered 2021-03-13: 20 mL

## 2021-03-13 MED ORDER — CHLORHEXIDINE GLUCONATE 0.12 % MT SOLN
15.0000 mL | Freq: Once | OROMUCOSAL | Status: AC
  Administered 2021-03-13: 15 mL via OROMUCOSAL

## 2021-03-13 MED ORDER — BUPIVACAINE LIPOSOME 1.3 % IJ SUSP: 20.0000 mL | Freq: Once | INTRAMUSCULAR | Status: DC

## 2021-03-13 MED ORDER — ACETAMINOPHEN 325 MG PO TABS: 325.0000 mg | ORAL_TABLET | ORAL | Status: DC | PRN

## 2021-03-13 MED ORDER — DEXAMETHASONE SODIUM PHOSPHATE 10 MG/ML IJ SOLN
INTRAMUSCULAR | Status: AC
  Filled 2021-03-13: qty 1

## 2021-03-13 MED ORDER — BUPIVACAINE LIPOSOME 1.3 % IJ SUSP
INTRAMUSCULAR | Status: AC
  Filled 2021-03-13: qty 20

## 2021-03-13 MED ORDER — OXYCODONE HCL 5 MG PO TABS
ORAL_TABLET | ORAL | Status: AC
  Filled 2021-03-13: qty 1

## 2021-03-13 MED ORDER — ACETAMINOPHEN 10 MG/ML IV SOLN: 1000.0000 mg | Freq: Once | INTRAVENOUS | Status: DC | PRN

## 2021-03-13 MED ORDER — LIDOCAINE HCL (PF) 2 % IJ SOLN
INTRAMUSCULAR | Status: AC
  Filled 2021-03-13: qty 5

## 2021-03-13 MED ORDER — OXYCODONE HCL 5 MG/5ML PO SOLN: 5.0000 mg | Freq: Once | ORAL | Status: AC | PRN

## 2021-03-13 MED ORDER — BUPIVACAINE-EPINEPHRINE (PF) 0.25% -1:200000 IJ SOLN
INTRAMUSCULAR | Status: AC
  Filled 2021-03-13: qty 30

## 2021-03-13 MED ORDER — OXYCODONE HCL 5 MG PO TABS
5.0000 mg | ORAL_TABLET | Freq: Once | ORAL | Status: AC | PRN
  Administered 2021-03-13: 5 mg via ORAL

## 2021-03-13 MED ORDER — AMISULPRIDE (ANTIEMETIC) 5 MG/2ML IV SOLN: 10.0000 mg | Freq: Once | INTRAVENOUS | Status: DC | PRN

## 2021-03-13 MED ORDER — EPHEDRINE SULFATE 50 MG/ML IJ SOLN
INTRAMUSCULAR | Status: DC | PRN
  Administered 2021-03-13: 5 mg via INTRAVENOUS
  Administered 2021-03-13: 7 mg via INTRAVENOUS

## 2021-03-13 MED ORDER — ACETAMINOPHEN 160 MG/5ML PO SOLN: 325.0000 mg | ORAL | Status: DC | PRN

## 2021-03-13 MED ORDER — CEFAZOLIN SODIUM-DEXTROSE 2-4 GM/100ML-% IV SOLN
2.0000 g | INTRAVENOUS | Status: AC
  Administered 2021-03-13: 2 g via INTRAVENOUS
  Filled 2021-03-13: qty 100

## 2021-03-13 MED ORDER — LACTATED RINGERS IV SOLN: INTRAVENOUS | Status: DC

## 2021-03-13 MED ORDER — HEPARIN SODIUM (PORCINE) 5000 UNIT/ML IJ SOLN
5000.0000 [IU] | Freq: Once | INTRAMUSCULAR | Status: AC
  Administered 2021-03-13: 5000 [IU] via SUBCUTANEOUS
  Filled 2021-03-13: qty 1

## 2021-03-13 MED ORDER — PHENYLEPHRINE HCL (PRESSORS) 10 MG/ML IV SOLN
INTRAVENOUS | Status: AC
  Filled 2021-03-13: qty 2

## 2021-03-13 MED ORDER — EPHEDRINE 5 MG/ML INJ
INTRAVENOUS | Status: AC
  Filled 2021-03-13: qty 5

## 2021-03-13 MED ORDER — FENTANYL CITRATE PF 50 MCG/ML IJ SOSY: 25.0000 ug | PREFILLED_SYRINGE | INTRAMUSCULAR | Status: DC | PRN

## 2021-03-13 MED ORDER — SUGAMMADEX SODIUM 200 MG/2ML IV SOLN
INTRAVENOUS | Status: DC | PRN
  Administered 2021-03-13: 200 mg via INTRAVENOUS

## 2021-03-13 MED ORDER — PHENYLEPHRINE HCL-NACL 20-0.9 MG/250ML-% IV SOLN
INTRAVENOUS | Status: DC | PRN
  Administered 2021-03-13: 50 ug/min via INTRAVENOUS

## 2021-03-13 MED ORDER — HYDROCODONE-ACETAMINOPHEN 5-325 MG PO TABS: 1.0000 | ORAL_TABLET | Freq: Four times a day (QID) | ORAL | 0 refills | Status: DC | PRN

## 2021-03-13 MED ORDER — ONDANSETRON HCL 4 MG/2ML IJ SOLN
INTRAMUSCULAR | Status: AC
  Filled 2021-03-13: qty 2

## 2021-03-13 MED ORDER — ROCURONIUM BROMIDE 10 MG/ML (PF) SYRINGE
PREFILLED_SYRINGE | INTRAVENOUS | Status: AC
  Filled 2021-03-13: qty 10

## 2021-03-13 MED ORDER — ORAL CARE MOUTH RINSE: 15.0000 mL | Freq: Once | OROMUCOSAL | Status: AC

## 2021-03-13 MED ORDER — CHLORHEXIDINE GLUCONATE CLOTH 2 % EX PADS: 6.0000 | MEDICATED_PAD | Freq: Once | CUTANEOUS | Status: DC

## 2021-03-13 MED ORDER — MIDAZOLAM HCL 5 MG/5ML IJ SOLN
INTRAMUSCULAR | Status: DC | PRN
  Administered 2021-03-13: 2 mg via INTRAVENOUS

## 2021-03-13 MED ORDER — PHENYLEPHRINE HCL (PRESSORS) 10 MG/ML IV SOLN
INTRAVENOUS | Status: DC | PRN
  Administered 2021-03-13: 60 ug via INTRAVENOUS

## 2021-03-13 MED ORDER — PROMETHAZINE HCL 25 MG/ML IJ SOLN: 6.2500 mg | INTRAMUSCULAR | Status: DC | PRN

## 2021-03-13 MED ORDER — ACETAMINOPHEN 500 MG PO TABS
1000.0000 mg | ORAL_TABLET | ORAL | Status: AC
  Administered 2021-03-13: 1000 mg via ORAL
  Filled 2021-03-13: qty 2

## 2021-03-13 MED ORDER — KETAMINE HCL 10 MG/ML IJ SOLN
INTRAMUSCULAR | Status: AC
  Filled 2021-03-13: qty 1

## 2021-03-13 MED ORDER — FENTANYL CITRATE (PF) 100 MCG/2ML IJ SOLN
INTRAMUSCULAR | Status: DC | PRN
  Administered 2021-03-13: 25 ug via INTRAVENOUS
  Administered 2021-03-13: 50 ug via INTRAVENOUS
  Administered 2021-03-13: 100 ug via INTRAVENOUS
  Administered 2021-03-13 (×3): 25 ug via INTRAVENOUS

## 2021-03-13 MED ORDER — MIDAZOLAM HCL 2 MG/2ML IJ SOLN
INTRAMUSCULAR | Status: AC
  Filled 2021-03-13: qty 2

## 2021-03-13 MED ORDER — FENTANYL CITRATE (PF) 250 MCG/5ML IJ SOLN
INTRAMUSCULAR | Status: AC
  Filled 2021-03-13: qty 5

## 2021-03-13 SURGICAL SUPPLY — 54 items
ADH SKN CLS APL DERMABOND .7 (GAUZE/BANDAGES/DRESSINGS) ×1
BAG COUNTER SPONGE SURGICOUNT (BAG) IMPLANT
BAG SPNG CNTER NS LX DISP (BAG)
BALL CTTN LRG ABS STRL LF (GAUZE/BANDAGES/DRESSINGS) ×1
BALL CTTN STRL GZE (GAUZE/BANDAGES/DRESSINGS) ×1
BINDER ABDOMINAL 12 ML 46-62 (SOFTGOODS) ×1 IMPLANT
COTTON BALL STERILE (GAUZE/BANDAGES/DRESSINGS) ×2
COTTON BALL STERILE 2 PK (GAUZE/BANDAGES/DRESSINGS) IMPLANT
COTTONBALL LRG STERILE PKG (GAUZE/BANDAGES/DRESSINGS) ×1 IMPLANT
COVER SURGICAL LIGHT HANDLE (MISCELLANEOUS) ×2 IMPLANT
DECANTER SPIKE VIAL GLASS SM (MISCELLANEOUS) ×2 IMPLANT
DERMABOND ADVANCED (GAUZE/BANDAGES/DRESSINGS) ×1
DERMABOND ADVANCED .7 DNX12 (GAUZE/BANDAGES/DRESSINGS) IMPLANT
DEVICE SECURE STRAP 25 ABSORB (INSTRUMENTS) ×1 IMPLANT
DEVICE TROCAR PUNCTURE CLOSURE (ENDOMECHANICALS) IMPLANT
DISSECTOR BLUNT TIP ENDO 5MM (MISCELLANEOUS) IMPLANT
DRAIN CHANNEL 19F RND (DRAIN) IMPLANT
DRSG TEGADERM 4X4.75 (GAUZE/BANDAGES/DRESSINGS) ×1 IMPLANT
ELECT REM PT RETURN 15FT ADLT (MISCELLANEOUS) ×2 IMPLANT
EVACUATOR SILICONE 100CC (DRAIN) IMPLANT
GAUZE SPONGE 2X2 8PLY STRL LF (GAUZE/BANDAGES/DRESSINGS) IMPLANT
GLOVE SURG ENC TEXT LTX SZ8 (GLOVE) ×2 IMPLANT
GOWN SPEC L4 XLG W/TWL (GOWN DISPOSABLE) ×2 IMPLANT
GOWN STRL REUS W/TWL XL LVL3 (GOWN DISPOSABLE) ×4 IMPLANT
IRRIG SUCT STRYKERFLOW 2 WTIP (MISCELLANEOUS)
IRRIGATION SUCT STRKRFLW 2 WTP (MISCELLANEOUS) IMPLANT
KIT BASIN OR (CUSTOM PROCEDURE TRAY) ×2 IMPLANT
KIT TURNOVER KIT A (KITS) IMPLANT
MARKER SKIN DUAL TIP RULER LAB (MISCELLANEOUS) ×1 IMPLANT
MESH VENTRALEX ST 1-7/10 CRC S (Mesh General) ×1 IMPLANT
NDL SPNL 22GX3.5 QUINCKE BK (NEEDLE) IMPLANT
NEEDLE SPNL 22GX3.5 QUINCKE BK (NEEDLE) IMPLANT
PAD POSITIONING PINK XL (MISCELLANEOUS) IMPLANT
PENCIL SMOKE EVACUATOR (MISCELLANEOUS) IMPLANT
PROTECTOR NERVE ULNAR (MISCELLANEOUS) IMPLANT
SCISSORS LAP 5X45 EPIX DISP (ENDOMECHANICALS) ×2 IMPLANT
SET TUBE SMOKE EVAC HIGH FLOW (TUBING) ×2 IMPLANT
SLEEVE XCEL OPT CAN 5 100 (ENDOMECHANICALS) ×2 IMPLANT
SPONGE GAUZE 2X2 8PLY STRL LF (GAUZE/BANDAGES/DRESSINGS) ×1 IMPLANT
SPONGE GAUZE 2X2 STER 10/PKG (GAUZE/BANDAGES/DRESSINGS) ×1
STAPLER VISISTAT 35W (STAPLE) IMPLANT
STRIP CLOSURE SKIN 1/2X4 (GAUZE/BANDAGES/DRESSINGS) IMPLANT
SUT MNCRL AB 4-0 PS2 18 (SUTURE) ×2 IMPLANT
SUT NOVA 0 T19/GS 22DT (SUTURE) IMPLANT
SUT NOVA NAB DX-16 0-1 5-0 T12 (SUTURE) ×2 IMPLANT
SUT NOVA NAB GS-21 0 18 T12 DT (SUTURE) IMPLANT
SUT VIC AB 4-0 SH 18 (SUTURE) ×2 IMPLANT
TACKER 5MM HERNIA 3.5CML NAB (ENDOMECHANICALS) IMPLANT
TOWEL OR 17X26 10 PK STRL BLUE (TOWEL DISPOSABLE) ×2 IMPLANT
TOWEL OR NON WOVEN STRL DISP B (DISPOSABLE) ×2 IMPLANT
TRAY FOLEY MTR SLVR 16FR STAT (SET/KITS/TRAYS/PACK) IMPLANT
TRAY LAPAROSCOPIC (CUSTOM PROCEDURE TRAY) ×2 IMPLANT
TROCAR BLADELESS OPT 5 100 (ENDOMECHANICALS) ×2 IMPLANT
TROCAR XCEL NON-BLD 11X100MML (ENDOMECHANICALS) IMPLANT

## 2021-03-13 NOTE — Transfer of Care (Signed)
Immediate Anesthesia Transfer of Care Note  Patient: [name] [name]  Procedure(s) Performed: LAPAROSCOPIC UMBILICAL HERNIA REPAIR  Patient Location: PACU  Anesthesia Type:General  Level of Consciousness: awake, alert , oriented and patient cooperative  Airway & Oxygen Therapy: Patient Spontanous Breathing and Patient connected to face mask oxygen  Post-op Assessment: Report given to RN and Post -op Vital signs reviewed and stable  Post vital signs: Reviewed and stable  Last Vitals:  Vitals Value Taken Time  BP 173/74 03/13/21 0907  Temp    Pulse 73 03/13/21 0908  Resp 12 03/13/21 0908  SpO2 100 % 03/13/21 0908  Vitals shown include unvalidated device data.  Last Pain:  Vitals:   03/13/21 0603  TempSrc:   PainSc: 4       Patients Stated Pain Goal: 3 (39/68/86 4847)  Complications: No notable events documented.

## 2021-03-13 NOTE — Interval H&P Note (Signed)
History and Physical Interval Note:  03/13/2021 7:24 AM  [name] [name]  has presented today for surgery, with the diagnosis of UMBILICAL HERNIA.  The various methods of treatment have been discussed with the patient and family. After consideration of risks, benefits and other options for treatment, the patient has consented to  Procedure(s): [place] (N/A) as a surgical intervention.  The patient's history has been reviewed, patient examined, no change in status, stable for surgery.  I have reviewed the patient's chart and labs.  Questions were answered to the patient's satisfaction.     Pedro Earls

## 2021-03-13 NOTE — Anesthesia Preprocedure Evaluation (Signed)
Anesthesia Evaluation  Patient identified by MRN, date of birth, ID band Patient awake    Reviewed: Allergy & Precautions, NPO status , Patient's Chart, lab work & pertinent test results  Airway Mallampati: III  TM Distance: >3 FB Neck ROM: Full    Dental  (+) Teeth Intact, Dental Advisory Given   Pulmonary sleep apnea and Continuous Positive Airway Pressure Ventilation , former smoker,    breath sounds clear to auscultation       Cardiovascular hypertension, Pt. on medications and Pt. on home beta blockers + CAD   Rhythm:Regular Rate:Normal     Neuro/Psych  Neuromuscular disease    GI/Hepatic negative GI ROS, Neg liver ROS,   Endo/Other  diabetes, Type 2, Oral Hypoglycemic Agents, Insulin Dependent  Renal/GU      Musculoskeletal  (+) Arthritis ,   Abdominal Normal abdominal exam  (+)   Peds  Hematology   Anesthesia Other Findings   Reproductive/Obstetrics                             Anesthesia Physical Anesthesia Plan  ASA: 2  Anesthesia Plan: General   Post-op Pain Management:    Induction: Intravenous  PONV Risk Score and Plan: 3 and Ondansetron, Midazolam and Dexamethasone  Airway Management Planned: Oral ETT  Additional Equipment: None  Intra-op Plan:   Post-operative Plan: Extubation in OR  Informed Consent: I have reviewed the patients History and Physical, chart, labs and discussed the procedure including the risks, benefits and alternatives for the proposed anesthesia with the patient or authorized representative who has indicated his/her understanding and acceptance.     Dental advisory given  Plan Discussed with: CRNA  Anesthesia Plan Comments: (Lab Results      Component                Value               Date                      WBC                      6.6                 02/25/2021                HGB                      13.6                02/25/2021                 HCT                      41.9                02/25/2021                MCV                      82.5                02/25/2021                PLT  264                 02/25/2021             Lab Results      Component                Value               Date                      CREATININE               1.54 (H)            02/25/2021                BUN                      36 (H)              02/25/2021                NA                       139                 02/25/2021                K                        4.0                 02/25/2021                CL                       104                 02/25/2021                CO2                      25                  02/25/2021           )        Anesthesia Quick Evaluation

## 2021-03-13 NOTE — Anesthesia Procedure Notes (Signed)
Procedure Name: Intubation Date/Time: 03/13/2021 7:37 AM Performed by: Garrel Ridgel, CRNA Pre-anesthesia Checklist: Patient identified, Emergency Drugs available, Suction available and Patient being monitored Patient Re-evaluated:Patient Re-evaluated prior to induction Oxygen Delivery Method: Circle system utilized Preoxygenation: Pre-oxygenation with 100% oxygen Induction Type: IV induction Ventilation: Mask ventilation without difficulty Laryngoscope Size: Glidescope and 4 Grade View: Grade II Tube type: Oral Tube size: 7.5 mm Number of attempts: 1 Airway Equipment and Method: Oral airway, Video-laryngoscopy and Rigid stylet Placement Confirmation: ETT inserted through vocal cords under direct vision, positive ETCO2 and breath sounds checked- equal and bilateral Secured at: 24 cm Tube secured with: Tape Dental Injury: Teeth and Oropharynx as per pre-operative assessment

## 2021-03-13 NOTE — Anesthesia Postprocedure Evaluation (Signed)
Anesthesia Post Note  Patient: [name] [name]  Procedure(s) Performed: [place]     Patient location during evaluation: PACU Anesthesia Type: General Level of consciousness: awake and alert Pain management: pain level controlled Vital Signs Assessment: post-procedure vital signs reviewed and stable Respiratory status: spontaneous breathing, nonlabored ventilation, respiratory function stable and patient connected to nasal cannula oxygen Cardiovascular status: blood pressure returned to baseline and stable Postop Assessment: no apparent nausea or vomiting Anesthetic complications: no   No notable events documented.  Last Vitals:  Vitals:   03/13/21 1030 03/13/21 1045  BP: (!) 184/72 (!) 176/77  Pulse: 70 71  Resp: 20 18  Temp:  36.6 C  SpO2: 94% 93%    Last Pain:  Vitals:   03/13/21 1045  TempSrc:   PainSc: 0-No pain                 Effie Berkshire

## 2021-03-13 NOTE — Op Note (Signed)
[name] [name]  1951/01/19   03/13/2021    PCP:  Cassandria Anger, MD   Surgeon: Kaylyn Lim, MD, FACS  Asst:  none  Anes:  general  Preop Dx: Incarcerated umbilical hernia Postop Dx: same  Procedure: Laparoscopy with reduction of incarcerated omentum; open placement of small Ventralex mesh    Location Surgery: [facility] OR 5 Complications: none  EBL:   minimal cc  Drains: none  Description of Procedure:  The patient was taken to OR 5 .  After anesthesia was administered and the patient was prepped  with Hibiclens  and a timeout was performed.  Access to the abdomen was achieved with a 5 mm Optiview through the left upper quadrant.  The abdomen was insufflated and omentum was found to be incarcerated in this umbilical hernia.  A second 5 mm was placed in the left lower quadrant and through that a Prestige grasper was used to grasp and reduce a fairly large wad of omentum that was incarcerated within a small hole.  Once this was reduced I made a small curvilinear infraumbilical incision and remove the very thin skin of this out a from the sac and was able to free the sac in its entirety revealing a small defect.  The sac was then cut away leaving the small defect which was about the size of a nickel.  Through that I placed a piece of the small piece of Ventralex mesh and secured with 2 horizontal mattress sutures on the inside burying the knots inside and then I sutured it with a single suture catching the the backside as I approximated the defect transversely again burying all the knots of 0 Novafil.  When that was done tacked the skin down onto the fascia closed this with 4-0 Vicryl in layers and then with 4-0 Monocryl and Dermabond.  Through the 5 mm port on the left side I inserted the secure strap stapler and and placed approximately 8 of the absorbable staples completely surrounding the perimeter of the mesh.  When completed it looked good.  Pictures were taken which were printed and  given to the wife and there was post to be placed in epic but I do not find them so they must not of made it over.  Dermabond was applied.  The wounds were anesthetized with Exparel is a tap block as I watch that being placed around all the incisions and currently after the Dermabond and an abdominal binder was placed patient was taken recovery in satisfactory condition.  The patient tolerated the procedure well and was taken to the PACU in stable condition.     Matt B. Hassell Done, [place], [facility], [place]

## 2021-03-16 ENCOUNTER — Encounter (HOSPITAL_COMMUNITY): Payer: Self-pay | Admitting: Surgery

## 2021-03-19 MED ORDER — EMPAGLIFLOZIN 10 MG PO TABS: 10.0000 mg | ORAL_TABLET | Freq: Every day | ORAL | 3 refills | Status: DC

## 2021-03-19 MED ORDER — AMLODIPINE BESYLATE 5 MG PO TABS: 5.0000 mg | ORAL_TABLET | Freq: Every day | ORAL | 3 refills | Status: DC

## 2021-03-19 MED ORDER — LOSARTAN POTASSIUM 100 MG PO TABS: 100.0000 mg | ORAL_TABLET | Freq: Every day | ORAL | 3 refills | Status: DC

## 2021-03-19 MED ORDER — TAMSULOSIN HCL 0.4 MG PO CAPS: 0.4000 mg | ORAL_CAPSULE | Freq: Every day | ORAL | 3 refills | Status: DC

## 2021-03-27 ENCOUNTER — Ambulatory Visit
Admission: RE | Admit: 2021-03-27 | Discharge: 2021-03-27 | Disposition: A | Discharge status: Home / Self Care | Payer: Medicare Other | Source: Ambulatory Visit | Attending: Surgery | Admitting: Surgery

## 2021-03-27 ENCOUNTER — Other Ambulatory Visit: Payer: Self-pay | Admitting: Surgery

## 2021-03-27 DIAGNOSIS — R079 Chest pain, unspecified: Secondary | ICD-10-CM | POA: Diagnosis not present

## 2021-04-27 DIAGNOSIS — C4442 Squamous cell carcinoma of skin of scalp and neck: Secondary | ICD-10-CM | POA: Diagnosis not present

## 2021-04-27 DIAGNOSIS — L578 Other skin changes due to chronic exposure to nonionizing radiation: Secondary | ICD-10-CM | POA: Diagnosis not present

## 2021-04-27 DIAGNOSIS — D485 Neoplasm of uncertain behavior of skin: Secondary | ICD-10-CM | POA: Diagnosis not present

## 2021-04-27 DIAGNOSIS — L821 Other seborrheic keratosis: Secondary | ICD-10-CM | POA: Diagnosis not present

## 2021-05-28 DIAGNOSIS — E7800 Pure hypercholesterolemia, unspecified: Secondary | ICD-10-CM | POA: Diagnosis not present

## 2021-05-28 DIAGNOSIS — E1165 Type 2 diabetes mellitus with hyperglycemia: Secondary | ICD-10-CM | POA: Diagnosis not present

## 2021-06-04 DIAGNOSIS — R609 Edema, unspecified: Secondary | ICD-10-CM | POA: Diagnosis not present

## 2021-06-04 DIAGNOSIS — I10 Essential (primary) hypertension: Secondary | ICD-10-CM | POA: Diagnosis not present

## 2021-06-04 DIAGNOSIS — E7800 Pure hypercholesterolemia, unspecified: Secondary | ICD-10-CM | POA: Diagnosis not present

## 2021-06-04 DIAGNOSIS — E1165 Type 2 diabetes mellitus with hyperglycemia: Secondary | ICD-10-CM | POA: Diagnosis not present

## 2021-06-11 DIAGNOSIS — G4733 Obstructive sleep apnea (adult) (pediatric): Secondary | ICD-10-CM | POA: Diagnosis not present

## 2021-08-18 ENCOUNTER — Other Ambulatory Visit: Payer: Self-pay | Admitting: Internal Medicine

## 2021-08-18 ENCOUNTER — Ambulatory Visit: Payer: Medicare Other | Admitting: Internal Medicine

## 2021-08-18 DIAGNOSIS — I10 Essential (primary) hypertension: Secondary | ICD-10-CM

## 2021-08-26 ENCOUNTER — Encounter: Payer: Self-pay | Admitting: Internal Medicine

## 2021-08-26 ENCOUNTER — Ambulatory Visit (INDEPENDENT_AMBULATORY_CARE_PROVIDER_SITE_OTHER): Payer: Medicare Other | Admitting: Internal Medicine

## 2021-08-26 DIAGNOSIS — L309 Dermatitis, unspecified: Secondary | ICD-10-CM

## 2021-08-26 DIAGNOSIS — R053 Chronic cough: Secondary | ICD-10-CM | POA: Diagnosis not present

## 2021-08-26 DIAGNOSIS — F439 Reaction to severe stress, unspecified: Secondary | ICD-10-CM | POA: Diagnosis not present

## 2021-08-26 DIAGNOSIS — I10 Essential (primary) hypertension: Secondary | ICD-10-CM | POA: Diagnosis not present

## 2021-08-26 MED ORDER — HYDROCOD POLI-CHLORPHE POLI ER 10-8 MG/5ML PO SUER: 5.0000 mL | Freq: Two times a day (BID) | ORAL | 0 refills | Status: DC | PRN

## 2021-08-26 MED ORDER — CLOBETASOL PROPIONATE 0.05 % EX SOLN: 1.0000 "application " | Freq: Two times a day (BID) | CUTANEOUS | 2 refills | Status: AC

## 2021-08-26 NOTE — Assessment & Plan Note (Signed)
Recurrent ?Tussionex prn - rare use ? Potential benefits of opioids use as well as potential risks (i.e. addiction risk, apnea etc) and complications (i.e. Somnolence, constipation and others) were explained to the patient and were aknowledged. ?

## 2021-08-26 NOTE — Progress Notes (Signed)
? ?Subjective:  ?Patient ID: [name] [name], male    DOB: [birth year]  Age: 71 y.o. MRN: [id number] ? ?CC: No chief complaint on file. ? ? ?HPI ?Kern Reap presents for cough, DM, HTN. Pt lost 20 lbs.Marland Kitchen ?C/o stress - wife has metastatic breast cancer ? ?Outpatient Medications Prior to Visit  ?Medication Sig Dispense Refill  ? amLODipine (NORVASC) 5 MG tablet Take 1 tablet (5 mg total) by mouth daily. 90 tablet 3  ? B-D ULTRAFINE III SHORT PEN 31G X 8 MM MISC USE AS DIRECTED 200 each 3  ? Cholecalciferol (VITAMIN D) 50 MCG (2000 UT) tablet Take 2,000 Units by mouth daily.    ? Continuous Blood Gluc Sensor (FREESTYLE LIBRE 14 DAY SENSOR) MISC USE AS DIRECTED EVERY 14 DAYS    ? doxylamine, Sleep, (UNISOM) 25 MG tablet Take 25 mg by mouth at bedtime as needed for sleep.    ? empagliflozin (JARDIANCE) 10 MG TABS tablet Take 1 tablet (10 mg total) by mouth daily. 90 tablet 3  ? gabapentin (NEURONTIN) 600 MG tablet Take 1 tablet (600 mg total) by mouth 2 (two) times daily. (Patient taking differently: Take 600 mg by mouth daily.) 180 tablet 3  ? glimepiride (AMARYL) 2 MG tablet Take 2 mg by mouth daily.    ? insulin glargine, 1 Unit Dial, (TOUJEO) 300 UNIT/ML Solostar Pen Inject 48 Units into the skin daily before breakfast.    ? losartan (COZAAR) 100 MG tablet Take 1 tablet (100 mg total) by mouth daily. 90 tablet 3  ? metoprolol succinate (TOPROL-XL) 25 MG 24 hr tablet TAKE 1 TABLET(25 MG) BY MOUTH IN THE MORNING AND AT BEDTIME 180 tablet 3  ? ondansetron (ZOFRAN) 4 MG tablet Take 1 tablet (4 mg total) by mouth every 8 (eight) hours as needed for nausea or vomiting. 20 tablet 0  ? OVER THE COUNTER MEDICATION Take 4 tablets by mouth 2 (two) times daily. Relief Factor    ? OZEMPIC, 0.25 OR 0.5 MG/DOSE, 2 MG/1.5ML SOPN Inject 0.25 mg into the skin every Tuesday.    ? promethazine (PHENERGAN) 25 MG tablet Take 25 mg by mouth every 6 (six) hours as needed for nausea or vomiting.    ? rosuvastatin (CRESTOR) 40 MG tablet  Take 40 mg by mouth at bedtime.    ? tamsulosin (FLOMAX) 0.4 MG CAPS capsule Take 1 capsule (0.4 mg total) by mouth daily after supper. 90 capsule 3  ? chlorpheniramine-HYDROcodone (TUSSIONEX PENNKINETIC ER) 10-8 MG/5ML SUER Take 5 mLs by mouth every 12 (twelve) hours as needed for cough. 230 mL 0  ? HYDROcodone-acetaminophen (NORCO/VICODIN) 5-325 MG tablet Take 1 tablet by mouth every 6 (six) hours as needed for moderate pain. 15 tablet 0  ? cloNIDine (CATAPRES) 0.1 MG tablet TAKE 1 TABLET BY MOUTH THREE TIMES DAILY AS NEEDED. TAKE IF SYSTOLIC BLOOD [id number] (Patient not taking: Reported on 02/19/2021) 90 tablet 2  ? furosemide (LASIX) 40 MG tablet Take 1 tablet (40 mg total) by mouth daily as needed. (Patient not taking: Reported on 02/19/2021) 90 tablet 1  ? pravastatin (PRAVACHOL) 40 MG tablet Take 1 tablet (40 mg total) by mouth daily. (Patient not taking: No sig reported) 90 tablet 3  ? ?No facility-administered medications prior to visit.  ? ? ?ROS: ?Review of Systems  ?Constitutional:  Negative for appetite change, fatigue and unexpected weight change.  ?HENT:  Negative for congestion, nosebleeds, sneezing, sore throat and trouble swallowing.   ?Eyes:  Negative for itching and visual disturbance.  ?  Respiratory:  Positive for cough.   ?Cardiovascular:  Negative for chest pain, palpitations and leg swelling.  ?Gastrointestinal:  Negative for abdominal distention, blood in stool, diarrhea and nausea.  ?Genitourinary:  Negative for frequency and hematuria.  ?Musculoskeletal:  Negative for back pain, gait problem, joint swelling and neck pain.  ?Skin:  Negative for rash.  ?Neurological:  Negative for dizziness, tremors, speech difficulty and weakness.  ?Psychiatric/Behavioral:  Negative for agitation, dysphoric mood, sleep disturbance and suicidal ideas. The patient is nervous/anxious.   ? ?Objective:  ?BP 122/70 (BP Location: Left Arm, Patient Position: Sitting, Cuff Size: Large)   Pulse 71   Temp 97.9 ?F  (36.6 ?C) (Oral)   Ht '5\' 9"'$  (1.753 m)   Wt 248 lb (112.5 kg)   SpO2 98%   BMI 36.62 kg/m?  ? ?BP Readings from Last 3 Encounters:  ?08/26/21 122/70  ?03/13/21 (!) 176/77  ?02/25/21 (!) 170/76  ? ? ?Wt Readings from Last 3 Encounters:  ?08/26/21 248 lb (112.5 kg)  ?03/13/21 248 lb 7.3 oz (112.7 kg)  ?02/25/21 248 lb 6 oz (112.7 kg)  ? ? ?Physical Exam ?Constitutional:   ?   General: He is not in acute distress. ?   Appearance: He is well-developed. He is obese.  ?   Comments: NAD  ?Eyes:  ?   Conjunctiva/sclera: Conjunctivae normal.  ?   Pupils: Pupils are equal, round, and reactive to light.  ?Neck:  ?   Thyroid: No thyromegaly.  ?   Vascular: No JVD.  ?Cardiovascular:  ?   Rate and Rhythm: Normal rate and regular rhythm.  ?   Heart sounds: Normal heart sounds. No murmur heard. ?  No friction rub. No gallop.  ?Pulmonary:  ?   Effort: Pulmonary effort is normal. No respiratory distress.  ?   Breath sounds: Normal breath sounds. No wheezing or rales.  ?Chest:  ?   Chest wall: No tenderness.  ?Abdominal:  ?   General: Bowel sounds are normal. There is no distension.  ?   Palpations: Abdomen is soft. There is no mass.  ?   Tenderness: There is no abdominal tenderness. There is no guarding or rebound.  ?Musculoskeletal:     ?   General: No tenderness. Normal range of motion.  ?   Cervical back: Normal range of motion.  ?Lymphadenopathy:  ?   Cervical: No cervical adenopathy.  ?Skin: ?   General: Skin is warm and dry.  ?   Findings: No rash.  ?Neurological:  ?   Mental Status: He is alert and oriented to person, place, and time.  ?   Cranial Nerves: No cranial nerve deficit.  ?   Motor: No abnormal muscle tone.  ?   Coordination: Coordination normal.  ?   Gait: Gait normal.  ?   Deep Tendon Reflexes: Reflexes are normal and symmetric.  ?Psychiatric:     ?   Behavior: Behavior normal.     ?   Thought Content: Thought content normal.     ?   Judgment: Judgment normal.  ? ? ?Lab Results  ?Component Value Date  ? WBC 6.6  02/25/2021  ? HGB 13.6 02/25/2021  ? HCT 41.9 02/25/2021  ? PLT 264 02/25/2021  ? GLUCOSE 143 (H) 02/25/2021  ? CHOL 165 08/19/2020  ? TRIG 111.0 08/19/2020  ? HDL 44.60 08/19/2020  ? LDLDIRECT 200.0 02/17/2016  ? Makena 98 08/19/2020  ? ALT 14 08/19/2020  ? AST 14 08/19/2020  ? NA 139  02/25/2021  ? K 4.0 02/25/2021  ? CL 104 02/25/2021  ? CREATININE 1.54 (H) 02/25/2021  ? BUN 36 (H) 02/25/2021  ? CO2 25 02/25/2021  ? TSH 3.46 08/19/2020  ? PSA 0.73 02/04/2021  ? INR 1.08 02/02/2011  ? HGBA1C 7.2 (H) 02/25/2021  ? MICROALBUR 182.1 (H) 08/19/2020  ? ? ?DG Chest 2 View ? ?Result Date: 03/28/2021 ?CLINICAL DATA:  Chest pain, recent umbilical hernia repair EXAM: CHEST - 2 VIEW COMPARISON:  05/21/2015 FINDINGS: The heart size and mediastinal contours are within normal limits. Both lungs are clear. The visualized skeletal structures are unremarkable except for degenerative changes of the thoracic spine. Trachea midline. Aorta atherosclerotic. IMPRESSION: Stable exam.  No active chest disease. Electronically Signed   By: Jerilynn Mages.  Shick M.D.   On: 03/28/2021 11:17  ? ? ?Assessment & Plan:  ? ?Problem List Items Addressed This Visit   ? ? Obesity, morbid ([place])  ?  Cont on on Ozempic ?F/u w/Dr Chalmers Cater ?  ?  ? Essential hypertension  ?  Chronic  ?Toprol XL, ASA, Amlodipine, Losartan ?  ?  ? Cough  ?  Recurrent ?Tussionex prn - rare use ? Potential benefits of opioids use as well as potential risks (i.e. addiction risk, apnea etc) and complications (i.e. Somnolence, constipation and others) were explained to the patient and were aknowledged. ? ?  ?  ? Dermatitis  ? Stress at home  ?  C/o stress - wife has metastatic breast cancer.  Discussed. ?  ?  ?  ? ? ?Meds ordered this encounter  ?Medications  ? clobetasol (TEMOVATE) 0.05 % external solution  ?  Sig: Apply 1 application. topically 2 (two) times daily.  ?  Dispense:  50 mL  ?  Refill:  2  ? chlorpheniramine-HYDROcodone (TUSSIONEX PENNKINETIC ER) 10-8 MG/5ML  ?  Sig: Take 5 mLs by  mouth every 12 (twelve) hours as needed for cough.  ?  Dispense:  230 mL  ?  Refill:  0  ?  ? ? ?Follow-up: Return in about 4 months (around 12/26/2021) for a follow-up visit. ? ?Walker Kehr, MD ?

## 2021-08-26 NOTE — Assessment & Plan Note (Signed)
F/u w/Dr Balan 

## 2021-08-26 NOTE — Assessment & Plan Note (Signed)
Chronic  ?Toprol XL, ASA, Amlodipine, Losartan ?

## 2021-08-26 NOTE — Assessment & Plan Note (Signed)
Cont on on Ozempic ?F/u w/Dr Chalmers Cater ?

## 2021-08-30 DIAGNOSIS — F439 Reaction to severe stress, unspecified: Secondary | ICD-10-CM | POA: Insufficient documentation

## 2021-08-30 NOTE — Assessment & Plan Note (Signed)
C/o stress - wife has metastatic breast cancer.  Discussed. ?

## 2021-09-09 DIAGNOSIS — G4733 Obstructive sleep apnea (adult) (pediatric): Secondary | ICD-10-CM | POA: Diagnosis not present

## 2021-11-02 DIAGNOSIS — C44622 Squamous cell carcinoma of skin of right upper limb, including shoulder: Secondary | ICD-10-CM | POA: Diagnosis not present

## 2021-11-02 DIAGNOSIS — C4442 Squamous cell carcinoma of skin of scalp and neck: Secondary | ICD-10-CM | POA: Diagnosis not present

## 2021-11-02 DIAGNOSIS — L578 Other skin changes due to chronic exposure to nonionizing radiation: Secondary | ICD-10-CM | POA: Diagnosis not present

## 2021-11-02 DIAGNOSIS — L821 Other seborrheic keratosis: Secondary | ICD-10-CM | POA: Diagnosis not present

## 2021-11-12 DIAGNOSIS — C44622 Squamous cell carcinoma of skin of right upper limb, including shoulder: Secondary | ICD-10-CM | POA: Diagnosis not present

## 2021-12-02 DIAGNOSIS — R609 Edema, unspecified: Secondary | ICD-10-CM | POA: Diagnosis not present

## 2021-12-02 DIAGNOSIS — I10 Essential (primary) hypertension: Secondary | ICD-10-CM | POA: Diagnosis not present

## 2021-12-02 DIAGNOSIS — E7800 Pure hypercholesterolemia, unspecified: Secondary | ICD-10-CM | POA: Diagnosis not present

## 2021-12-02 DIAGNOSIS — E1165 Type 2 diabetes mellitus with hyperglycemia: Secondary | ICD-10-CM | POA: Diagnosis not present

## 2021-12-13 IMAGING — MR MR ABDOMEN WO/W CM
19 series · 48 of 48 positions shown · IV contrast (multihance)
Comparison: MRI 11/27/2018.  CT stone study 02/19/2019.

CLINICAL DATA: Right renal Bosniak II F cyst.  Follow-up.

EXAM:
MRI ABDOMEN WITHOUT AND WITH CONTRAST
TECHNIQUE: Multiplanar multisequence MR imaging of the abdomen was performed
both before and after the administration of intravenous contrast.
CONTRAST:  20mL MULTIHANCE GADOBENATE DIMEGLUMINE 529 MG/ML IV SOLN

[Series 3: T2 · coronal · 5.0mm · 1.56mm/px · 1 of 36 slices shown (1 of 3)]
[im 1/36]
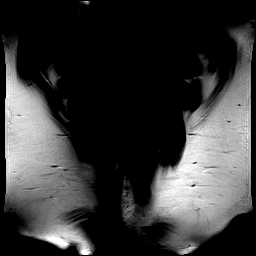

[Series 4: T1 · axial · 3.0mm · 1.19mm/px · z∈[-61,+152]mm · 4 of 144 slices shown]
[im 1/144]
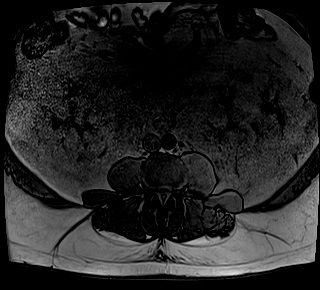
[im 48/144]
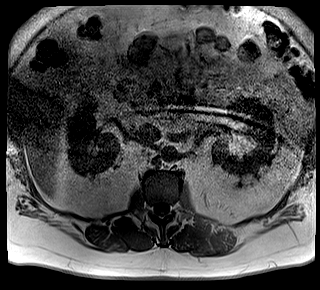
[im 96/144]
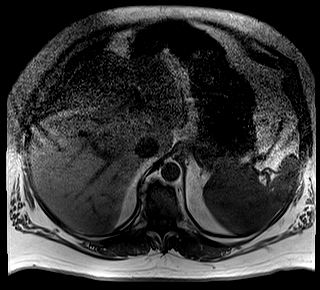
[im 144/144]
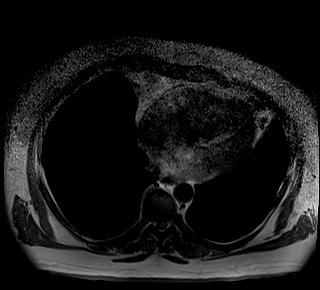

[Series 5: T2 · axial · 6.0mm · 1.22mm/px · 1 of 30 slices shown (2 of 3)]
[im 1/30]
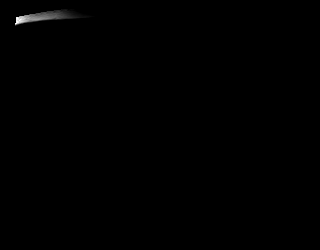

[Series 6: bSSFP · axial · 5.0mm · 1.25mm/px · 1 of 38 slices shown]
[im 1/38]
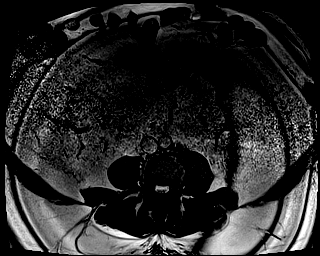

[Series 7: T2 · axial · 5.0mm · 1.48mm/px · 1 of 38 slices shown (3 of 3)]
[im 1/38]
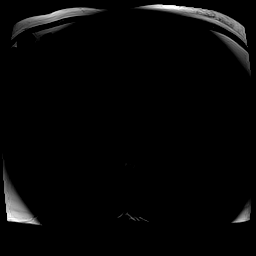

[Series 8: DWI · axial · 5.0mm · 1.42mm/px · z∈[-49,+173]mm · 3 of 114 slices shown (1 of 2)]
[im 1/114]
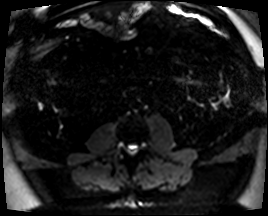
[im 57/114]
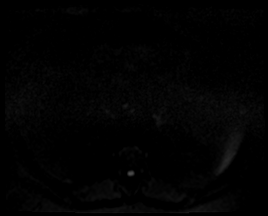
[im 114/114]
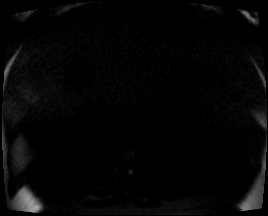

[Series 9: DWI · axial · 5.0mm · 1.42mm/px · 1 of 38 slices shown (2 of 2)]
[im 1/38]
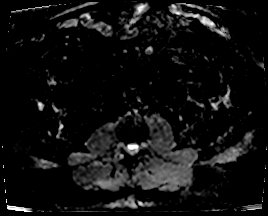

[Series 10: T1 dynamic · axial · non-contrast · 3.0mm · 1.25mm/px · z∈[-64,+149]mm · 3 of 72 slices shown]
[im 1/72]
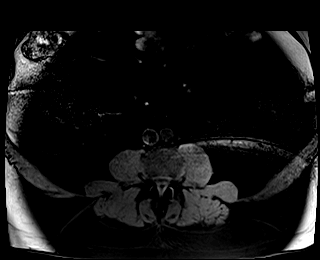
[im 36/72]
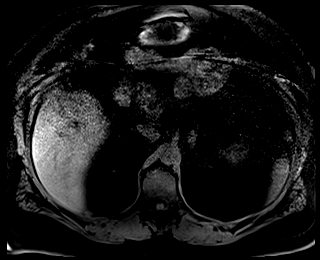
[im 72/72]
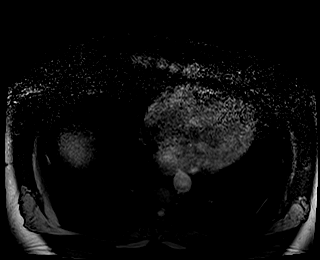

[Series 14: T1 dynamic post-contrast · axial · 3.0mm · 1.25mm/px · z∈[-64,+149]mm · 3 of 72 slices shown (1 of 11)]
[im 1/72]
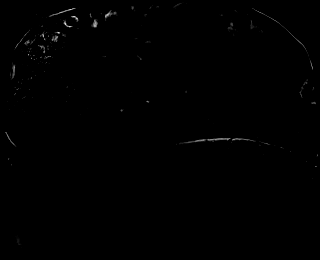
[im 36/72]
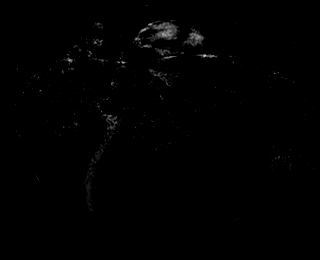
[im 72/72]
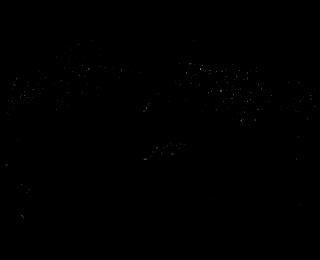

[Series 16: T1 dynamic post-contrast · axial · 3.0mm · 1.25mm/px · z∈[-64,+149]mm · 3 of 71 slices shown (2 of 11)]
[im 1/71]
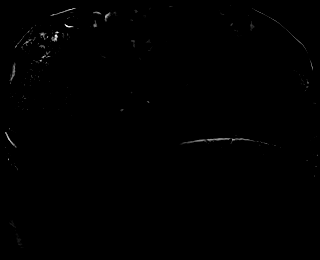
[im 36/71]
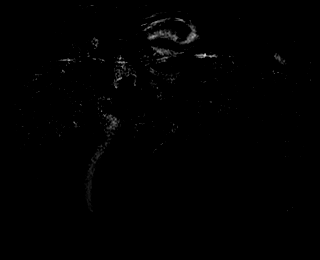
[im 71/71]
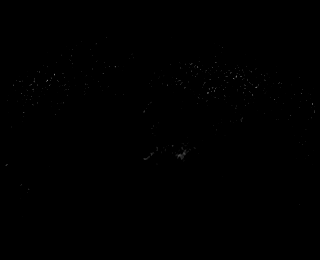

[Series 20: T1 dynamic post-contrast · axial · 3.0mm · 1.25mm/px · z∈[-64,+149]mm · 3 of 72 slices shown (3 of 11)]
[im 1/72]
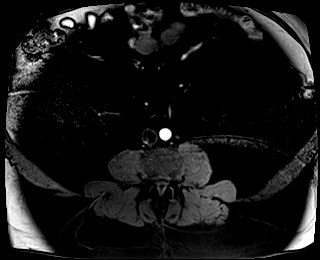
[im 36/72]
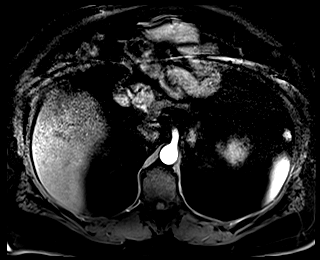
[im 72/72]
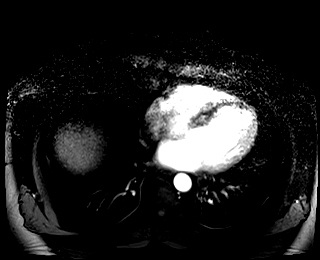

[Series 21: T1 dynamic post-contrast · axial · 3.0mm · 1.25mm/px · z∈[-64,+149]mm · 3 of 72 slices shown (4 of 11)]
[im 1/72]
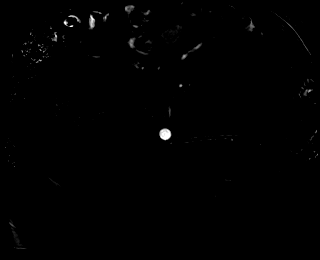
[im 36/72]
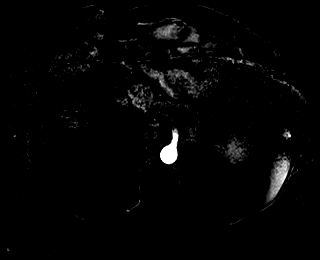
[im 72/72]
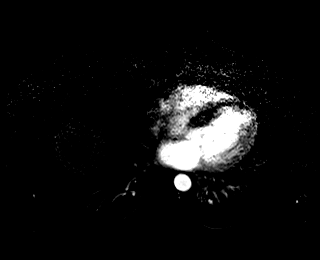

[Series 22: T1 dynamic post-contrast · axial · 3.0mm · 1.25mm/px · z∈[-64,+149]mm · 3 of 72 slices shown (5 of 11)]
[im 1/72]
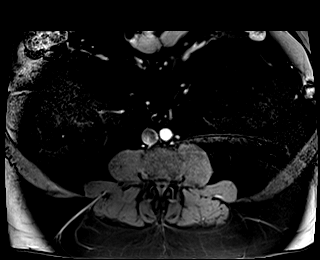
[im 36/72]
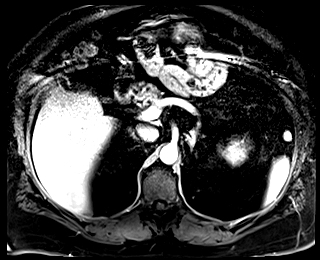
[im 72/72]
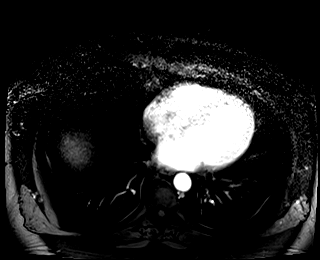

[Series 23: T1 dynamic post-contrast · axial · 3.0mm · 1.25mm/px · z∈[-64,+149]mm · 3 of 72 slices shown (6 of 11)]
[im 1/72]
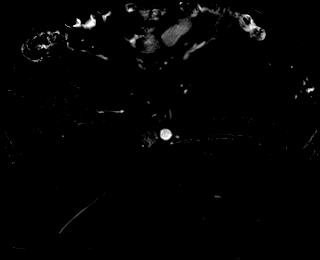
[im 36/72]
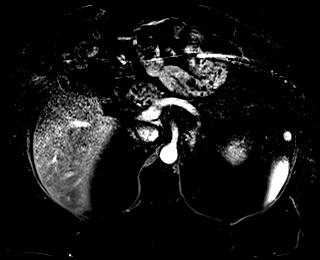
[im 72/72]
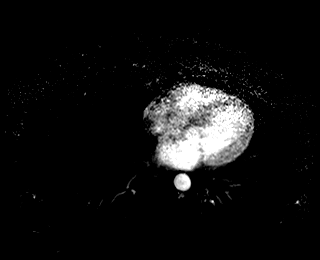

[Series 24: T1 dynamic post-contrast · axial · 3.0mm · 1.25mm/px · z∈[-64,+149]mm · 3 of 72 slices shown (7 of 11)]
[im 1/72]
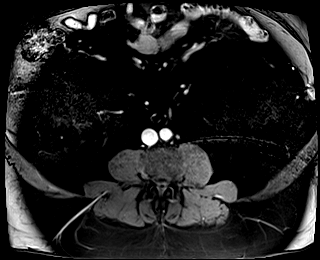
[im 36/72]
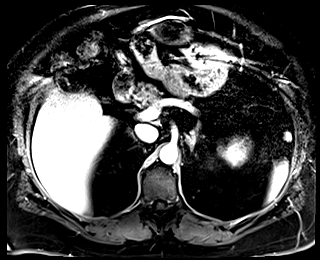
[im 72/72]
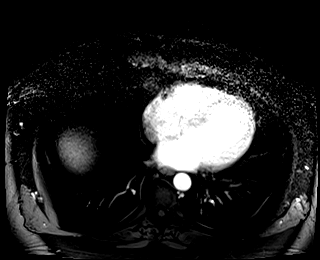

[Series 25: T1 dynamic post-contrast · axial · 3.0mm · 1.25mm/px · z∈[-64,+149]mm · 3 of 72 slices shown (8 of 11)]
[im 1/72]
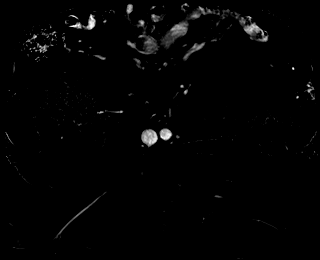
[im 36/72]
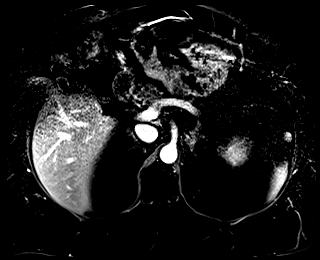
[im 72/72]
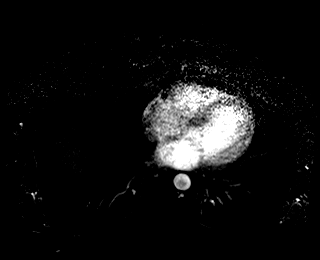

[Series 26: T1 dynamic post-contrast · coronal · 3.0mm · 1.25mm/px · 3 of 72 slices shown (9 of 11)]
[im 1/72]
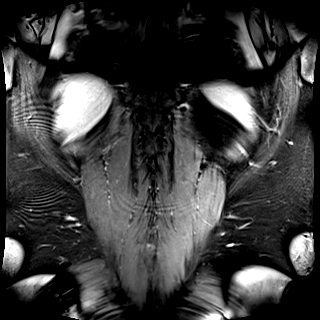
[im 36/72]
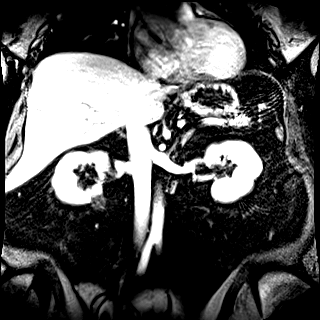
[im 72/72]
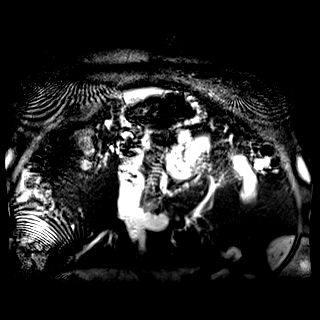

[Series 27: T1 dynamic post-contrast · axial · 3.0mm · 1.25mm/px · z∈[-64,+149]mm · 3 of 72 slices shown (10 of 11)]
[im 1/72]
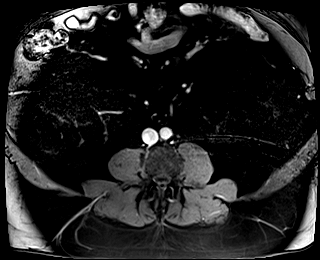
[im 36/72]
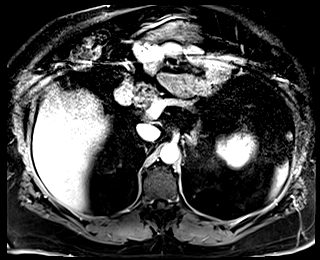
[im 72/72]
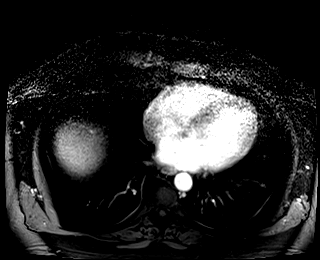

[Series 28: T1 dynamic post-contrast · axial · 3.0mm · 1.25mm/px · z∈[-64,+149]mm · 3 of 72 slices shown (11 of 11)]
[im 1/72]
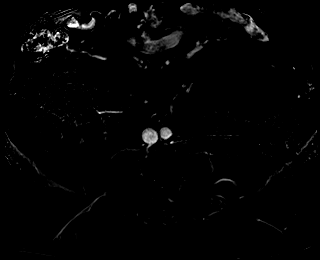
[im 36/72]
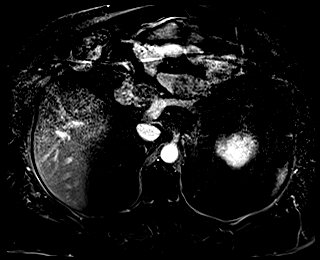
[im 72/72]
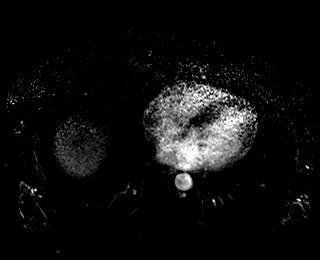

[48 of 48 positions shown; findings below may reference images not displayed]

FINDINGS: Lower chest: Unremarkable.

Hepatobiliary: Substantial motion artifact through the anterior
liver without discrete focal liver lesion evident. No substantial
loss of signal intensity in the liver parenchyma on out of phase T1
imaging. Gallbladder is decompressed. No intrahepatic or
extrahepatic biliary dilation.

Pancreas: No focal mass lesion. No dilatation of the main duct. No
intraparenchymal cyst. No peripancreatic edema.

Spleen:  No splenomegaly. No focal mass lesion.

Adrenals/Urinary Tract: Right adrenal gland unremarkable. 10 mm left
adrenal nodule shows loss of signal intensity on out of phase T1
imaging consistent with adenoma.

Exophytic lower pole lesion of the right kidney is not substantially
changed measuring 2.0 cm today compared to 1.9 cm previously. This
lesion shows intermediate signal intensity on both T1 and T2
imaging. No evidence for enhancement in the lesion on
postcontrast/subtraction imaging today. Imaging features are
consistent with Bosniak II cyst.

Other scattered smaller Bosniak I and II cysts are noted in the
kidneys bilaterally. Patient has additional tiny (less than 3-4 mm)
foci of hypoenhancement in both kidneys, too small to characterize
but likely benign.

No suspicious enhancing lesion in either kidney on today's exam.

Stomach/Bowel: Stomach is unremarkable. No gastric wall thickening.
No evidence of outlet obstruction. Duodenum is normally positioned
as is the ligament of Treitz. Visualized small bowel loops and
colonic segments of the abdomen are nondilated.

Vascular/Lymphatic: No abdominal aortic aneurysm. No abdominal
lymphadenopathy.

Other:  No intraperitoneal free fluid.

Musculoskeletal: No suspicious marrow enhancement within the
visualized bony anatomy.
IMPRESSION: 1. Stable appearance of the exophytic lower pole lesion of the right
kidney. Less motion artifact in this region on today's study and
imaging features are consistent with a Bosniak II cyst. Other
scattered smaller Bosniak I and II cysts in the kidneys bilaterally.
2. Scattered tiny foci of hypoenhancement in each kidney, too small
to characterize but likely benign.
3. 10 mm left adrenal adenoma, stable.

## 2021-12-24 ENCOUNTER — Ambulatory Visit (INDEPENDENT_AMBULATORY_CARE_PROVIDER_SITE_OTHER): Payer: Medicare Other

## 2021-12-24 DIAGNOSIS — Z0000 Encounter for general adult medical examination without abnormal findings: Secondary | ICD-10-CM

## 2021-12-24 NOTE — Progress Notes (Cosign Needed Addendum)
I connected with [name] [name] today by telephone and verified that I am speaking with the correct person using two identifiers. Location patient: home Location provider: work Persons participating in the virtual visit: patient, provider.   I discussed the limitations, risks, security and privacy concerns of performing an evaluation and management service by telephone and the availability of in person appointments. I also discussed with the patient that there may be a patient responsible charge related to this service. The patient expressed understanding and verbally consented to this telephonic visit.    Interactive audio and video telecommunications were attempted between this provider and patient, however failed, due to patient having technical difficulties OR patient did not have access to video capability.  We continued and completed visit with audio only.  Some vital signs may be absent or patient reported.   Time Spent with patient on telephone encounter: 30 minutes  Subjective:   [name] [name] is a 71 y.o. male who presents for Medicare Annual/Subsequent preventive examination.  Review of Systems     Cardiac Risk Factors include: advanced age (>42mn, >>57women);diabetes mellitus;hypertension;dyslipidemia;family history of premature cardiovascular disease;male gender;obesity (BMI >30kg/m2);sedentary lifestyle     Objective:    There were no vitals filed for this visit. There is no height or weight on file to calculate BMI.     12/24/2021    9:48 AM 02/25/2021    9:02 AM 02/19/2019    3:41 PM 12/29/2018    1:12 PM 02/12/2016    7:09 AM 06/23/2015    9:08 PM 08/24/2014   11:38 AM  Advanced Directives  Does Patient Have a Medical Advance Directive? Yes Yes No No No Yes Yes  Type of Advance Directive Living will;Healthcare Power of Attorney Living will    [place]  Does patient want to make changes to  medical advance directive? No - Patient declined      No - Patient declined  Copy of [place]yn[place]in Chart? No - copy requested     No - copy requested No - copy requested  Would patient like information on creating a medical advance directive?     No - patient declined information      Current Medications (verified) Outpatient Encounter Medications as of 12/24/2021  Medication Sig   amLODipine (NORVASC) 5 MG tablet Take 1 tablet (5 mg total) by mouth daily.   B-D ULTRAFINE III SHORT PEN 31G X 8 MM MISC USE AS DIRECTED   chlorpheniramine-HYDROcodone (TUSSIONEX PENNKINETIC ER) 10-8 MG/5ML Take 5 mLs by mouth every 12 (twelve) hours as needed for cough.   Cholecalciferol (VITAMIN D) 50 MCG (2000 UT) tablet Take 2,000 Units by mouth daily.   clobetasol (TEMOVATE) 0.05 % external solution Apply 1 application. topically 2 (two) times daily.   cloNIDine (CATAPRES) 0.1 MG tablet TAKE 1 TABLET BY MOUTH THREE TIMES DAILY AS NEEDED. TAKE IF SYSTOLIC BLOOD [id number](Patient not taking: Reported on 02/19/2021)   Continuous Blood Gluc Sensor (FREESTYLE LIBRE 14 DAY SENSOR) MISC USE AS DIRECTED EVERY 14 DAYS   doxylamine, Sleep, (UNISOM) 25 MG tablet Take 25 mg by mouth at bedtime as needed for sleep.   empagliflozin (JARDIANCE) 10 MG TABS tablet Take 1 tablet (10 mg total) by mouth daily.   gabapentin (NEURONTIN) 600 MG tablet Take 1 tablet (600 mg total) by mouth 2 (two) times daily. (Patient taking differently: Take 600 mg by mouth daily.)   glimepiride (AMARYL) 2 MG  tablet Take 2 mg by mouth daily.   insulin glargine, 1 Unit Dial, (TOUJEO) 300 UNIT/ML Solostar Pen Inject 48 Units into the skin daily before breakfast.   losartan (COZAAR) 100 MG tablet Take 1 tablet (100 mg total) by mouth daily.   metoprolol succinate (TOPROL-XL) 25 MG 24 hr tablet TAKE 1 TABLET(25 MG) BY MOUTH IN THE MORNING AND AT BEDTIME   ondansetron (ZOFRAN) 4 MG tablet Take 1 tablet (4 mg total) by mouth every 8  (eight) hours as needed for nausea or vomiting.   OVER THE COUNTER MEDICATION Take 4 tablets by mouth 2 (two) times daily. Relief Factor   OZEMPIC, 0.25 OR 0.5 MG/DOSE, 2 MG/1.5ML SOPN Inject 0.25 mg into the skin every Tuesday.   promethazine (PHENERGAN) 25 MG tablet Take 25 mg by mouth every 6 (six) hours as needed for nausea or vomiting.   rosuvastatin (CRESTOR) 40 MG tablet Take 40 mg by mouth at bedtime.   tamsulosin (FLOMAX) 0.4 MG CAPS capsule Take 1 capsule (0.4 mg total) by mouth daily after supper.   No facility-administered encounter medications on file as of 12/24/2021.    Allergies (verified) Amoxicillin, Dilaudid [hydromorphone], Lisinopril, Simvastatin, and Penicillins   History: Past Medical History:  Diagnosis Date   Arthritis    knees   Bursitis of left shoulder    Cancer (HCC)    skin   Cervical disc disease    Chronic kidney disease    stage 3   Diabetes mellitus, type 2 ([place])    Diverticulosis of colon    Erectile dysfunction    Exogenous obesity    History of anemia    History of kidney stones    Hyperlipidemia    Hypertension    Internal hemorrhoids    Internal hemorrhoids with complication - fecal smearing 09/11/2018   Neuromuscular disorder (HCC)    neuropathy in feet   OSA (obstructive sleep apnea)    cpap   Testicular hypofunction    Past Surgical History:  Procedure Laterality Date   ANTERIOR CERVICAL DECOMP/DISCECTOMY FUSION  2008   some limit to ROM   COLONOSCOPY     POSTERIOR LAMINECTOMY / DECOMPRESSION CERVICAL SPINE  2009   c5-6 fusion  Dr. Consuello Masse. with nerve damage to fingers Rt arm   UMBILICAL HERNIA REPAIR N/A 03/13/2021   Procedure: LAPAROSCOPIC UMBILICAL HERNIA REPAIR;  Surgeon: Johnathan Hausen, MD;  Location: [facility];  Service: General;  Laterality: N/A;   Family History  Problem Relation Age of Onset   Heart disease Father        Died of MI at age 76   Breast cancer Mother    Diabetes Paternal Grandmother    Healthy Son     Colon cancer Neg Hx    Social History   Socioeconomic History   Marital status: Married    Spouse name: Not on file   Number of children: 1   Years of education: Not on file   Highest education level: Not on file  Occupational History   Occupation: Librarian, academic of warehouse for [employer]   Occupation: SUPERVISOR    Employer: [employer]   Occupation: retired 2018  Tobacco Use   Smoking status: Former    Types: Cigarettes    Quit date: 01/20/1981    Years since quitting: 40.9   Smokeless tobacco: Never  Vaping Use   Vaping Use: Never used  Substance and Sexual Activity   Alcohol use: Yes    Alcohol/week: 7.0 standard drinks of  alcohol    Types: 4 Glasses of wine, 3 Standard drinks or equivalent per week    Comment: He drinks 3-4 glasses of wine several times per week.   Drug use: No   Sexual activity: Yes  Other Topics Concern   Not on file  Social History Narrative   Lives with wife.  Has 1 son. Two story house   Right handed    Retired.  [employer].  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   Occasional alcohol former smoker no drug use   Social Determinants of Health   Financial Resource Strain: Low Risk  (12/24/2021)   Overall Financial Resource Strain (CARDIA)    Difficulty of Paying Living Expenses: Not hard at all  Food Insecurity: No Food Insecurity (12/24/2021)   Hunger Vital Sign    Worried About Running Out of Food in the Last Year: Never true    Ran Out of Food in the Last Year: Never true  Transportation Needs: No Transportation Needs (12/24/2021)   PRAPARE - Hydrologist (Medical): No    Lack of Transportation (Non-Medical): No  Physical Activity: Inactive (12/24/2021)   Exercise Vital Sign    Days of Exercise per Week: 0 days    Minutes of Exercise per Session: 0 min  Stress: No Stress Concern Present (12/24/2021)   [place]    Feeling of Stress : Not at all  Social Connections: [place] (12/24/2021)   Social Connection and Isolation Panel [NHANES]    Frequency of Communication with Friends and Family: More than three times a week    Frequency of Social Gatherings with Friends and Family: More than three times a week    Attends Religious Services: More than 4 times per year    Active Member of [employer] or Organizations: Yes    Attends Music therapist: More than 4 times per year    Marital Status: Married    Tobacco Counseling Counseling given: Not Answered   Clinical Intake:  Pre-visit preparation completed: Yes  Pain : No/denies pain     Nutritional Risks: None Diabetes: Yes CBG done?: No Did pt. bring in CBG monitor from home?: No  How often do you need to have someone help you when you read instructions, pamphlets, or other written materials from your doctor or pharmacy?: 1 - Never What is the last grade level you completed in school?: HSG  Diabetic? yes  Interpreter Needed?: No  Information entered by :: Lisette Abu, LPN.   Activities of Daily Living    12/24/2021    9:59 AM 02/25/2021    9:05 AM  In your present state of health, do you have any difficulty performing the following activities:  Hearing? 0   Vision? 0   Difficulty concentrating or making decisions? 0   Walking or climbing stairs? 0   Dressing or bathing? 0   Doing errands, shopping? 0 0  Preparing Food and eating ? N   Using the Toilet? N   In the past six months, have you accidently leaked urine? N   Do you have problems with loss of bowel control? N   Managing your Medications? N   Managing your Finances? N   Housekeeping or managing your Housekeeping? N     Patient Care Team: Plotnikov, Evie Lacks, MD as PCP - General (Internal Medicine) Kathie Rhodes, MD (Inactive) as Consulting Physician (Urology)  Jacelyn Pi, MD as Referring Physician  (Endocrinology) Donato Heinz, MD as Consulting Physician (Nephrology)  Indicate any recent Medical Services you may have received from other than [facility] providers in the past year (date may be approximate).     Assessment:   This is a routine wellness examination for [employer].  Hearing/Vision screen Hearing Screening - Comments:: Patient denied any hearing difficulty.   No hearing aids.  Vision Screening - Comments:: Patient does wear corrective lenses/contacts.  Eye exam done by: [facility]   Dietary issues and exercise activities discussed: Current Exercise Habits: The patient does not participate in regular exercise at present, Exercise limited by: respiratory conditions(s)   Goals Addressed             This Visit's Progress    CCM:  Monitor and Maintain HbA1c <8%        Depression Screen    12/24/2021    9:52 AM 08/26/2021    3:20 PM 09/01/2020    9:24 AM 08/28/2019    8:35 AM 07/05/2018    8:37 AM 11/19/2016    9:33 AM 07/19/2012    3:00 PM  PHQ 2/9 Scores  PHQ - 2 Score 0 0 0 0 0 0 0  PHQ- 9 Score   0        Fall Risk    12/24/2021    9:49 AM 08/26/2021    3:19 PM 02/28/2020    9:29 AM 12/29/2018    1:12 PM 12/28/2017    9:23 AM  Fall Risk   Falls in the past year? 0 0 0 0 No  Number falls in past yr: 0 0 0 0   Injury with Fall? 0 0 0 0   Risk for fall due to : No Fall Risks No Fall Risks     Follow up Falls evaluation completed Falls evaluation completed       [place]:  Any stairs in or around the home? Yes  If so, are there any without handrails? No  Home free of loose throw rugs in walkways, pet beds, electrical cords, etc? Yes  Adequate lighting in your home to reduce risk of falls? Yes   ASSISTIVE DEVICES UTILIZED TO PREVENT FALLS:  Life alert? No  Use of a cane, walker or w/c? No  Grab bars in the bathroom? No  Shower chair or bench in shower? Yes  Elevated toilet seat or a handicapped toilet? Yes    TIMED UP AND GO:  Was the test performed? No .  Length of time to ambulate 10 feet: n/a sec.   Appearance of gait: Gait not evaluated during this visit.  Cognitive Function:        12/24/2021    9:59 AM  6CIT Screen  What Year? 0 points  What month? 0 points  What time? 0 points  Count back from 20 0 points  Months in reverse 0 points  Repeat phrase 0 points  Total Score 0 points    Immunizations Immunization History  Administered Date(s) Administered   Fluad Quad(high Dose 65+) 01/08/2019, 02/04/2021   Influenza Split 02/17/2011, 02/22/2012   Influenza Whole 05/10/2005, 02/07/2009, 02/03/2010   Influenza, High Dose Seasonal PF 02/28/2017, 03/07/2018   Influenza,inj,Quad PF,6+ Mos 01/17/2013, 12/31/2015   Influenza-Unspecified 02/06/2014, 02/05/2015   PFIZER(Purple Top)SARS-COV-2 Vaccination 06/19/2019, 07/14/2019, 02/19/2020   Pneumococcal Conjugate-13 03/26/2015   Pneumococcal Polysaccharide-23 05/10/2002, 07/05/2017   Td 04/11/2008   Zoster Recombinat (Shingrix) 09/14/2021, 12/21/2021  TDAP status: Due, Education has been provided regarding the importance of this vaccine. Advised may receive this vaccine at local pharmacy or Health Dept. Aware to provide a copy of the vaccination record if obtained from local pharmacy or Health Dept. Verbalized acceptance and understanding.  Flu Vaccine status: Up to date  Pneumococcal vaccine status: Up to date  Covid-19 vaccine status: Completed vaccines  Qualifies for Shingles Vaccine? Yes   Zostavax completed No   Shingrix Completed?: Yes  Screening Tests Health Maintenance  Topic Date Due   OPHTHALMOLOGY EXAM  01/26/2018   FOOT EXAM  02/28/2018   TETANUS/TDAP  04/11/2018   COVID-19 Vaccine (4 - Pfizer risk series) 04/15/2020   Diabetic kidney evaluation - Urine ACR  08/19/2021   HEMOGLOBIN A1C  08/26/2021   INFLUENZA VACCINE  12/08/2021   Diabetic kidney evaluation - GFR measurement  02/25/2022   COLONOSCOPY  (Pts 45-15yr Insurance coverage will need to be confirmed)  04/11/2023   Pneumonia Vaccine 71 Years old  Completed   Hepatitis C Screening  Completed   Zoster Vaccines- Shingrix  Completed   HPV VACCINES  Aged Out    Health Maintenance  Health Maintenance Due  Topic Date Due   OPHTHALMOLOGY EXAM  01/26/2018   FOOT EXAM  02/28/2018   TETANUS/TDAP  04/11/2018   COVID-19 Vaccine (4 - Pfizer risk series) 04/15/2020   Diabetic kidney evaluation - Urine ACR  08/19/2021   HEMOGLOBIN A1C  08/26/2021   INFLUENZA VACCINE  12/08/2021    Colorectal cancer screening: Type of screening: Colonoscopy. Completed 12//06/2012. Repeat every 10 years  Lung Cancer Screening: (Low Dose CT Chest recommended if Age 71-80years, 30 pack-year currently smoking OR have quit w/in 15years.) does not qualify.   Lung Cancer Screening Referral: no  Additional Screening:  Hepatitis C Screening: does qualify; Completed 04/09/2015  Vision Screening: Recommended annual ophthalmology exams for early detection of glaucoma and other disorders of the eye. Is the patient up to date with their annual eye exam?  Yes  Who is the provider or what is the name of the office in which the patient attends annual eye exams? [facility] If pt is not established with a provider, would they like to be referred to a provider to establish care? No .   Dental Screening: Recommended annual dental exams for proper oral hygiene  Community Resource Referral / Chronic Care Management: CRR required this visit?  No   CCM required this visit?  No      Plan:     I have personally reviewed and noted the following in the patient's chart:   Medical and social history Use of alcohol, tobacco or illicit drugs  Current medications and supplements including opioid prescriptions. Patient is not currently taking opioid prescriptions. Functional ability and status Nutritional status Physical activity Advanced directives List of other  physicians Hospitalizations, surgeries, and ER visits in previous 12 months Vitals Screenings to include cognitive, depression, and falls Referrals and appointments  In addition, I have reviewed and discussed with patient certain preventive protocols, quality metrics, and best practice recommendations. A written personalized care plan for preventive services as well as general preventive health recommendations were provided to patient.     SSheral Flow LPN   [id number]  Nurse Notes:  Patient is cogitatively intact. There were no vitals filed for this visit. There is no height or weight on file to calculate BMI. Patient stated that he has no issues with gait or balance; does not use  any assistive devices.   Medical screening examination/treatment/procedure(s) were performed by non-physician practitioner and as supervising physician I was immediately available for consultation/collaboration.  I agree with above. Lew Dawes, MD

## 2021-12-24 NOTE — Patient Instructions (Signed)
[name] [name] , Thank you for taking time to come for your Medicare Wellness Visit. I appreciate your ongoing commitment to your health goals. Please review the following plan we discussed and let me know if I can assist you in the future.   Screening recommendations/referrals: Colonoscopy: 04/10/2013; due every 10 years Recommended yearly ophthalmology/optometry visit for glaucoma screening and checkup Recommended yearly dental visit for hygiene and checkup  Vaccinations: Influenza vaccine: 01/15/2021 Pneumococcal vaccine: 07/05/2017, 03/26/2015 Tdap vaccine: due Shingles vaccine: 09/14/2021, 12/21/2021   Covid-19: 06/19/2019, 07/14/2019, 02/19/2020  Advanced directives: Yes; Please bring a copy of your health care power of attorney and living will to the office at your convenience.  Conditions/risks identified: Yes; Maintain HgA1C <6.9%  Next appointment: Please schedule your next Medicare Wellness Visit with your Nurse Health Advisor in 1 year by calling [phone].  Preventive Care 71 Years and Older, Male Preventive care refers to lifestyle choices and visits with your health care provider that can promote health and wellness. What does preventive care include? A yearly physical exam. This is also called an annual well check. Dental exams once or twice a year. Routine eye exams. Ask your health care provider how often you should have your eyes checked. Personal lifestyle choices, including: Daily care of your teeth and gums. Regular physical activity. Eating a healthy diet. Avoiding tobacco and drug use. Limiting alcohol use. Practicing safe sex. Taking low doses of aspirin every day. Taking vitamin and mineral supplements as recommended by your health care provider. What happens during an annual well check? The services and screenings done by your health care provider during your annual well check will depend on your age, overall health, lifestyle risk factors, and family history of  disease. Counseling  Your health care provider may ask you questions about your: Alcohol use. Tobacco use. Drug use. Emotional well-being. Home and relationship well-being. Sexual activity. Eating habits. History of falls. Memory and ability to understand (cognition). Work and work Statistician. Screening  You may have the following tests or measurements: Height, weight, and BMI. Blood pressure. Lipid and cholesterol levels. These may be checked every 5 years, or more frequently if you are over 74 years old. Skin check. Lung cancer screening. You may have this screening every year starting at age 62 if you have a 30-pack-year history of smoking and currently smoke or have quit within the past 15 years. Fecal occult blood test (FOBT) of the stool. You may have this test every year starting at age 44. Flexible sigmoidoscopy or colonoscopy. You may have a sigmoidoscopy every 5 years or a colonoscopy every 10 years starting at age 69. Prostate cancer screening. Recommendations will vary depending on your family history and other risks. Hepatitis C blood test. Hepatitis B blood test. Sexually transmitted disease (STD) testing. Diabetes screening. This is done by checking your blood sugar (glucose) after you have not eaten for a while (fasting). You may have this done every 1-3 years. Abdominal aortic aneurysm (AAA) screening. You may need this if you are a current or former smoker. Osteoporosis. You may be screened starting at age 44 if you are at high risk. Talk with your health care provider about your test results, treatment options, and if necessary, the need for more tests. Vaccines  Your health care provider may recommend certain vaccines, such as: Influenza vaccine. This is recommended every year. Tetanus, diphtheria, and acellular pertussis (Tdap, Td) vaccine. You may need a Td booster every 10 years. Zoster vaccine. You may need this  after age 43. Pneumococcal 13-valent  conjugate (PCV13) vaccine. One dose is recommended after age 81. Pneumococcal polysaccharide (PPSV23) vaccine. One dose is recommended after age 48. Talk to your health care provider about which screenings and vaccines you need and how often you need them. This information is not intended to replace advice given to you by your health care provider. Make sure you discuss any questions you have with your health care provider. Document Released: 05/23/2015 Document Revised: 01/14/2016 Document Reviewed: 02/25/2015 Elsevier Interactive Patient Education  2017 [employer] Prevention in the Home Falls can cause injuries. They can happen to people of all ages. There are many things you can do to make your home safe and to help prevent falls. What can I do on the outside of my home? Regularly fix the edges of walkways and driveways and fix any cracks. Remove anything that might make you trip as you walk through a door, such as a raised step or threshold. Trim any bushes or trees on the path to your home. Use bright outdoor lighting. Clear any walking paths of anything that might make someone trip, such as rocks or tools. Regularly check to see if handrails are loose or broken. Make sure that both sides of any steps have handrails. Any raised decks and porches should have guardrails on the edges. Have any leaves, snow, or ice cleared regularly. Use sand or salt on walking paths during winter. Clean up any spills in your garage right away. This includes oil or grease spills. What can I do in the bathroom? Use night lights. Install grab bars by the toilet and in the tub and shower. Do not use towel bars as grab bars. Use non-skid mats or decals in the tub or shower. If you need to sit down in the shower, use a plastic, non-slip stool. Keep the floor dry. Clean up any water that spills on the floor as soon as it happens. Remove soap buildup in the tub or shower regularly. Attach bath mats  securely with double-sided non-slip rug tape. Do not have throw rugs and other things on the floor that can make you trip. What can I do in the bedroom? Use night lights. Make sure that you have a light by your bed that is easy to reach. Do not use any sheets or blankets that are too big for your bed. They should not hang down onto the floor. Have a firm chair that has side arms. You can use this for support while you get dressed. Do not have throw rugs and other things on the floor that can make you trip. What can I do in the kitchen? Clean up any spills right away. Avoid walking on wet floors. Keep items that you use a lot in easy-to-reach places. If you need to reach something above you, use a strong step stool that has a grab bar. Keep electrical cords out of the way. Do not use floor polish or wax that makes floors slippery. If you must use wax, use non-skid floor wax. Do not have throw rugs and other things on the floor that can make you trip. What can I do with my stairs? Do not leave any items on the stairs. Make sure that there are handrails on both sides of the stairs and use them. Fix handrails that are broken or loose. Make sure that handrails are as long as the stairways. Check any carpeting to make sure that it is firmly attached to the  stairs. Fix any carpet that is loose or worn. Avoid having throw rugs at the top or bottom of the stairs. If you do have throw rugs, attach them to the floor with carpet tape. Make sure that you have a light switch at the top of the stairs and the bottom of the stairs. If you do not have them, ask someone to add them for you. What else can I do to help prevent falls? Wear shoes that: Do not have high heels. Have rubber bottoms. Are comfortable and fit you well. Are closed at the toe. Do not wear sandals. If you use a stepladder: Make sure that it is fully opened. Do not climb a closed stepladder. Make sure that both sides of the stepladder  are locked into place. Ask someone to hold it for you, if possible. Clearly mark and make sure that you can see: Any grab bars or handrails. First and last steps. Where the edge of each step is. Use tools that help you move around (mobility aids) if they are needed. These include: Canes. Walkers. Scooters. Crutches. Turn on the lights when you go into a dark area. Replace any light bulbs as soon as they burn out. Set up your furniture so you have a clear path. Avoid moving your furniture around. If any of your floors are uneven, fix them. If there are any pets around you, be aware of where they are. Review your medicines with your doctor. Some medicines can make you feel dizzy. This can increase your chance of falling. Ask your doctor what other things that you can do to help prevent falls. This information is not intended to replace advice given to you by your health care provider. Make sure you discuss any questions you have with your health care provider. Document Released: 02/20/2009 Document Revised: 10/02/2015 Document Reviewed: 05/31/2014 Elsevier Interactive Patient Education  2017 [employer].

## 2021-12-28 ENCOUNTER — Encounter: Payer: Self-pay | Admitting: Internal Medicine

## 2021-12-28 ENCOUNTER — Ambulatory Visit (INDEPENDENT_AMBULATORY_CARE_PROVIDER_SITE_OTHER): Payer: Medicare Other | Admitting: Internal Medicine

## 2021-12-28 ENCOUNTER — Ambulatory Visit (INDEPENDENT_AMBULATORY_CARE_PROVIDER_SITE_OTHER): Payer: Medicare Other

## 2021-12-28 VITALS — BP 118/72 | HR 61 | Temp 97.9°F | Ht 69.0 in | Wt 246.6 lb

## 2021-12-28 DIAGNOSIS — E1165 Type 2 diabetes mellitus with hyperglycemia: Secondary | ICD-10-CM

## 2021-12-28 DIAGNOSIS — Z9989 Dependence on other enabling machines and devices: Secondary | ICD-10-CM

## 2021-12-28 DIAGNOSIS — R053 Chronic cough: Secondary | ICD-10-CM

## 2021-12-28 DIAGNOSIS — Z794 Long term (current) use of insulin: Secondary | ICD-10-CM

## 2021-12-28 DIAGNOSIS — R0609 Other forms of dyspnea: Secondary | ICD-10-CM

## 2021-12-28 DIAGNOSIS — R059 Cough, unspecified: Secondary | ICD-10-CM | POA: Diagnosis not present

## 2021-12-28 DIAGNOSIS — R0602 Shortness of breath: Secondary | ICD-10-CM | POA: Diagnosis not present

## 2021-12-28 DIAGNOSIS — T148XXA Other injury of unspecified body region, initial encounter: Secondary | ICD-10-CM

## 2021-12-28 DIAGNOSIS — F439 Reaction to severe stress, unspecified: Secondary | ICD-10-CM

## 2021-12-28 DIAGNOSIS — N320 Bladder-neck obstruction: Secondary | ICD-10-CM

## 2021-12-28 DIAGNOSIS — J9811 Atelectasis: Secondary | ICD-10-CM | POA: Diagnosis not present

## 2021-12-28 DIAGNOSIS — G4733 Obstructive sleep apnea (adult) (pediatric): Secondary | ICD-10-CM

## 2021-12-28 MED ORDER — HYDROCOD POLI-CHLORPHE POLI ER 10-8 MG/5ML PO SUER: 5.0000 mL | Freq: Two times a day (BID) | ORAL | 0 refills | Status: DC | PRN

## 2021-12-28 NOTE — Assessment & Plan Note (Signed)
Will ref to Dr Elsworth Soho

## 2021-12-28 NOTE — Assessment & Plan Note (Signed)
A1c is good now

## 2021-12-28 NOTE — Assessment & Plan Note (Signed)
Discussed: wife has metastatic breast cancer on XRT, chemo

## 2021-12-28 NOTE — Assessment & Plan Note (Addendum)
Cont on [place], [place], Ozempic F/u w/Dr Chalmers Cater

## 2021-12-28 NOTE — Assessment & Plan Note (Signed)
F/u w/Dr Chalmers Cater

## 2021-12-28 NOTE — Progress Notes (Signed)
Subjective:  Patient ID: [name] [name], male    DOB: [birth year]  Age: 71 y.o. MRN: [id number]  CC: Follow-up (4 MONTH F/U- Req. Refill on cough syrup)   HPI [name] [name] presents for chronic cough, tick bite, OSA, DM  Outpatient Medications Prior to Visit  Medication Sig Dispense Refill   amLODipine (NORVASC) 5 MG tablet Take 1 tablet (5 mg total) by mouth daily. 90 tablet 3   B-D ULTRAFINE III SHORT PEN 31G X 8 MM MISC USE AS DIRECTED 200 each 3   Cholecalciferol (VITAMIN D) 50 MCG (2000 UT) tablet Take 2,000 Units by mouth daily.     clobetasol (TEMOVATE) 0.05 % external solution Apply 1 application. topically 2 (two) times daily. 50 mL 2   cloNIDine (CATAPRES) 0.1 MG tablet TAKE 1 TABLET BY MOUTH THREE TIMES DAILY AS NEEDED. TAKE IF SYSTOLIC BLOOD [id number] 90 tablet 2   Continuous Blood Gluc Sensor (FREESTYLE LIBRE 14 DAY SENSOR) MISC USE AS DIRECTED EVERY 14 DAYS     doxylamine, Sleep, (UNISOM) 25 MG tablet Take 25 mg by mouth at bedtime as needed for sleep.     empagliflozin (JARDIANCE) 10 MG TABS tablet Take 1 tablet (10 mg total) by mouth daily. 90 tablet 3   gabapentin (NEURONTIN) 600 MG tablet Take 1 tablet (600 mg total) by mouth 2 (two) times daily. (Patient taking differently: Take 600 mg by mouth daily.) 180 tablet 3   glimepiride (AMARYL) 2 MG tablet Take 2 mg by mouth daily.     insulin glargine, 1 Unit Dial, (TOUJEO) 300 UNIT/ML Solostar Pen Inject 48 Units into the skin daily before breakfast.     losartan (COZAAR) 100 MG tablet Take 1 tablet (100 mg total) by mouth daily. 90 tablet 3   metoprolol succinate (TOPROL-XL) 25 MG 24 hr tablet TAKE 1 TABLET(25 MG) BY MOUTH IN THE MORNING AND AT BEDTIME 180 tablet 3   ondansetron (ZOFRAN) 4 MG tablet Take 1 tablet (4 mg total) by mouth every 8 (eight) hours as needed for nausea or vomiting. 20 tablet 0   OVER THE COUNTER MEDICATION Take 4 tablets by mouth 2 (two) times daily. Relief Factor     OZEMPIC, 0.25 OR 0.5  MG/DOSE, 2 MG/1.5ML SOPN Inject 0.25 mg into the skin every Tuesday.     rosuvastatin (CRESTOR) 40 MG tablet Take 40 mg by mouth at bedtime.     tamsulosin (FLOMAX) 0.4 MG CAPS capsule Take 1 capsule (0.4 mg total) by mouth daily after supper. 90 capsule 3   chlorpheniramine-HYDROcodone (TUSSIONEX PENNKINETIC ER) 10-8 MG/5ML Take 5 mLs by mouth every 12 (twelve) hours as needed for cough. 230 mL 0   promethazine (PHENERGAN) 25 MG tablet Take 25 mg by mouth every 6 (six) hours as needed for nausea or vomiting. (Patient not taking: Reported on 12/28/2021)     No facility-administered medications prior to visit.    ROS: Review of Systems  Constitutional:  Negative for appetite change, fatigue and unexpected weight change.  HENT:  Positive for congestion. Negative for nosebleeds, sneezing, sore throat and trouble swallowing.   Eyes:  Negative for itching and visual disturbance.  Respiratory:  Positive for cough.   Cardiovascular:  Negative for chest pain, palpitations and leg swelling.  Gastrointestinal:  Negative for abdominal distention, blood in stool, diarrhea and nausea.  Genitourinary:  Negative for frequency and hematuria.  Musculoskeletal:  Negative for back pain, gait problem, joint swelling and neck pain.  Skin:  Negative for rash.  Neurological:  Negative for dizziness, tremors, speech difficulty and weakness.  Hematological:  Bruises/bleeds easily.  Psychiatric/Behavioral:  Negative for agitation, dysphoric mood and sleep disturbance. The patient is not nervous/anxious.     Objective:  BP 118/72 (BP Location: Left Arm)   Pulse 61   Temp 97.9 F (36.6 C) (Oral)   Ht '5\' 9"'$  (1.753 m)   Wt 246 lb 9.6 oz (111.9 kg)   SpO2 96%   BMI 36.42 kg/m   BP Readings from Last 3 Encounters:  12/28/21 118/72  08/26/21 122/70  03/13/21 (!) 176/77    Wt Readings from Last 3 Encounters:  12/28/21 246 lb 9.6 oz (111.9 kg)  08/26/21 248 lb (112.5 kg)  03/13/21 248 lb 7.3 oz (112.7 kg)     Physical Exam Constitutional:      General: He is not in acute distress.    Appearance: He is well-developed. He is obese.     Comments: NAD  Eyes:     Conjunctiva/sclera: Conjunctivae normal.     Pupils: Pupils are equal, round, and reactive to light.  Neck:     Thyroid: No thyromegaly.     Vascular: No JVD.  Cardiovascular:     Rate and Rhythm: Normal rate and regular rhythm.     Heart sounds: Normal heart sounds. No murmur heard.    No friction rub. No gallop.  Pulmonary:     Effort: Pulmonary effort is normal. No respiratory distress.     Breath sounds: Normal breath sounds. No wheezing or rales.  Chest:     Chest wall: No tenderness.  Abdominal:     General: Bowel sounds are normal. There is no distension.     Palpations: Abdomen is soft. There is no mass.     Tenderness: There is no abdominal tenderness. There is no guarding or rebound.  Musculoskeletal:        General: No tenderness. Normal range of motion.     Cervical back: Normal range of motion.  Lymphadenopathy:     Cervical: No cervical adenopathy.  Skin:    General: Skin is warm and dry.     Findings: Bruising present. No rash.  Neurological:     Mental Status: He is alert and oriented to person, place, and time.     Cranial Nerves: No cranial nerve deficit.     Motor: No abnormal muscle tone.     Coordination: Coordination normal.     Gait: Gait normal.     Deep Tendon Reflexes: Reflexes are normal and symmetric.  Psychiatric:        Behavior: Behavior normal.        Thought Content: Thought content normal.        Judgment: Judgment normal.   Dorsal hands w/bruises  Lab Results  Component Value Date   WBC 6.6 02/25/2021   HGB 13.6 02/25/2021   HCT 41.9 02/25/2021   PLT 264 02/25/2021   GLUCOSE 143 (H) 02/25/2021   CHOL 165 08/19/2020   TRIG 111.0 08/19/2020   HDL 44.60 08/19/2020   LDLDIRECT 200.0 02/17/2016   LDLCALC 98 08/19/2020   ALT 14 08/19/2020   AST 14 08/19/2020   NA 139  02/25/2021   K 4.0 02/25/2021   CL 104 02/25/2021   CREATININE 1.54 (H) 02/25/2021   BUN 36 (H) 02/25/2021   CO2 25 02/25/2021   TSH 3.46 08/19/2020   PSA 0.73 02/04/2021   INR 1.08 02/02/2011   HGBA1C 7.2 (H) 02/25/2021   MICROALBUR 182.1 (H) 08/19/2020  DG Chest 2 View  Result Date: 03/28/2021 CLINICAL DATA:  Chest pain, recent umbilical hernia repair EXAM: CHEST - 2 VIEW COMPARISON:  05/21/2015 FINDINGS: The heart size and mediastinal contours are within normal limits. Both lungs are clear. The visualized skeletal structures are unremarkable except for degenerative changes of the thoracic spine. Trachea midline. Aorta atherosclerotic. IMPRESSION: Stable exam.  No active chest disease. Electronically Signed   By: Jerilynn Mages.  Shick M.D.   On: 03/28/2021 11:17    Assessment & Plan:   Problem List Items Addressed This Visit     Bruising    Use Arnica cream      Cough    Recurrent/ chronic Tussionex prn - rare use  Potential benefits of opioids use as well as potential risks (i.e. addiction risk, apnea etc) and complications (i.e. Somnolence, constipation and others) were explained to the patient and were aknowledged.      DM2 (diabetes mellitus, type 2) ([place]) - Primary    Cont on Toujeo, Jardiance, Ozempic F/u w/Dr Chalmers Cater      Relevant Orders   TSH   PSA   Urinalysis   CBC with Differential/Platelet   Comprehensive metabolic panel   Lipid panel   DOE (dyspnea on exertion)    DOE, cough - on prn Tussionex CXR      Relevant Orders   DG Chest 2 View   Obesity, morbid ([place])    F/u w/Dr Chalmers Cater      OSA on CPAP    Will ref to Dr Elsworth Soho      Relevant Orders   Ambulatory referral to Pulmonology   Stress at home    Discussed: wife has metastatic breast cancer on XRT, chemo      Other Visit Diagnoses     Bladder neck obstruction       Relevant Orders   PSA         Meds ordered this encounter  Medications   chlorpheniramine-HYDROcodone (TUSSIONEX) 10-8 MG/5ML     Sig: Take 5 mLs by mouth every 12 (twelve) hours as needed for cough.    Dispense:  230 mL    Refill:  0      Follow-up: Return in about 6 months (around 06/30/2022) for Wellness Exam.  Walker Kehr, MD

## 2021-12-28 NOTE — Assessment & Plan Note (Addendum)
DOE, cough - on prn Tussionex CXR

## 2021-12-28 NOTE — Assessment & Plan Note (Signed)
Use Arnica cream

## 2021-12-28 NOTE — Patient Instructions (Signed)
Arnica cream

## 2021-12-28 NOTE — Assessment & Plan Note (Signed)
Recurrent/ chronic Tussionex prn - rare use  Potential benefits of opioids use as well as potential risks (i.e. addiction risk, apnea etc) and complications (i.e. Somnolence, constipation and others) were explained to the patient and were aknowledged. 

## 2021-12-29 ENCOUNTER — Other Ambulatory Visit: Payer: Self-pay | Admitting: Internal Medicine

## 2021-12-30 ENCOUNTER — Other Ambulatory Visit: Payer: Self-pay | Admitting: *Deleted

## 2021-12-30 MED ORDER — TAMSULOSIN HCL 0.4 MG PO CAPS: 0.4000 mg | ORAL_CAPSULE | Freq: Every day | ORAL | 3 refills | Status: DC

## 2021-12-30 MED ORDER — AMLODIPINE BESYLATE 5 MG PO TABS: 5.0000 mg | ORAL_TABLET | Freq: Every day | ORAL | 3 refills | Status: DC

## 2021-12-31 ENCOUNTER — Other Ambulatory Visit: Payer: Self-pay | Admitting: Internal Medicine

## 2021-12-31 DIAGNOSIS — R0609 Other forms of dyspnea: Secondary | ICD-10-CM

## 2021-12-31 DIAGNOSIS — R053 Chronic cough: Secondary | ICD-10-CM

## 2021-12-31 DIAGNOSIS — R9389 Abnormal findings on diagnostic imaging of other specified body structures: Secondary | ICD-10-CM | POA: Insufficient documentation

## 2021-12-31 MED ORDER — FUROSEMIDE 20 MG PO TABS: 20.0000 mg | ORAL_TABLET | Freq: Every day | ORAL | 5 refills | Status: DC

## 2021-12-31 NOTE — Assessment & Plan Note (Signed)
8/23 chest x-ray with probable infiltrates, vascular congestion, left pleural effusion Start furosemide.  Obtain echocardiogram.  Obtain chest CT without contrast in 1 month

## 2021-12-31 NOTE — Assessment & Plan Note (Signed)
8/23 probable infiltrates, vascular congestion, left pleural effusion Start furosemide.  Obtain echocardiogram.  Obtain chest CT without contrast in 1 month

## 2022-01-13 ENCOUNTER — Ambulatory Visit
Admission: RE | Admit: 2022-01-13 | Discharge: 2022-01-13 | Disposition: A | Discharge status: Home / Self Care | Payer: Medicare Other | Source: Ambulatory Visit | Attending: Internal Medicine | Admitting: Internal Medicine

## 2022-01-13 DIAGNOSIS — R053 Chronic cough: Secondary | ICD-10-CM

## 2022-01-13 DIAGNOSIS — R0609 Other forms of dyspnea: Secondary | ICD-10-CM

## 2022-01-13 DIAGNOSIS — R9389 Abnormal findings on diagnostic imaging of other specified body structures: Secondary | ICD-10-CM

## 2022-01-13 DIAGNOSIS — J9811 Atelectasis: Secondary | ICD-10-CM | POA: Diagnosis not present

## 2022-01-13 DIAGNOSIS — J439 Emphysema, unspecified: Secondary | ICD-10-CM | POA: Diagnosis not present

## 2022-01-18 ENCOUNTER — Ambulatory Visit: Payer: Self-pay | Admitting: Licensed Clinical Social Worker

## 2022-01-18 NOTE — Patient Instructions (Signed)
Visit Information  Thank you for taking time to visit with me today. Please don't hesitate to contact me if I can be of assistance to you.   Following are the goals we discussed today:   Goals Addressed             This Visit's Progress    COMPLETED: Care Coordination Activities No follow up Required       Care Coordination Interventions: Discussed benefits of Medicare Annual Wellness Visit : completed Reviewed Care Coordination Services:Declined        Please call the care guide team at [phone] if you need to cancel or reschedule your appointment.   Patient verbalizes understanding of instructions and care plan provided today and agrees to view in [place]. Active MyChart status and patient understanding of how to access instructions and care plan via MyChart confirmed with patient.     No further follow up required: by Care Coordination at this time  Casimer Lanius, [place] [phone]

## 2022-01-18 NOTE — Patient Outreach (Signed)
  Care Coordination   Initial Visit Note   01/18/2022 Name: [name] [name] MRN: [id number] DOB: [birth year]  [name] [name] is a 71 y.o. year old male who sees Plotnikov, Evie Lacks, MD for primary care. I spoke with  Kern Reap by phone today.  What matters to the patients health and wellness today?    Patient reports no concerns or needs from Care Coordination team with health and wellness related to physical or mental heath. .     Goals Addressed             This Visit's Progress    COMPLETED: Care Coordination Activities No follow up Required       Care Coordination Interventions: Discussed benefits of Medicare Annual Wellness Visit : completed Reviewed Care Coordination Services:Declined        SDOH assessments and interventions completed:  No    Care Coordination Interventions Activated:  Yes  Care Coordination Interventions:  Yes, provided   Follow up plan: No further intervention required.   Encounter Outcome:  Pt. Visit Completed   Casimer Lanius, [place] [phone]

## 2022-01-26 DIAGNOSIS — E119 Type 2 diabetes mellitus without complications: Secondary | ICD-10-CM | POA: Diagnosis not present

## 2022-01-26 LAB — HM DIABETES EYE EXAM

## 2022-01-27 ENCOUNTER — Ambulatory Visit (HOSPITAL_COMMUNITY)
Admission: RE | Admit: 2022-01-27 | Discharge: 2022-01-27 | Disposition: A | Discharge status: Home / Self Care | Payer: Medicare Other | Source: Ambulatory Visit | Attending: Internal Medicine | Admitting: Internal Medicine

## 2022-01-27 DIAGNOSIS — E669 Obesity, unspecified: Secondary | ICD-10-CM | POA: Diagnosis not present

## 2022-01-27 DIAGNOSIS — I081 Rheumatic disorders of both mitral and tricuspid valves: Secondary | ICD-10-CM | POA: Insufficient documentation

## 2022-01-27 DIAGNOSIS — I10 Essential (primary) hypertension: Secondary | ICD-10-CM | POA: Diagnosis not present

## 2022-01-27 DIAGNOSIS — Z87891 Personal history of nicotine dependence: Secondary | ICD-10-CM | POA: Insufficient documentation

## 2022-01-27 DIAGNOSIS — E785 Hyperlipidemia, unspecified: Secondary | ICD-10-CM | POA: Diagnosis not present

## 2022-01-27 DIAGNOSIS — Z8249 Family history of ischemic heart disease and other diseases of the circulatory system: Secondary | ICD-10-CM | POA: Diagnosis not present

## 2022-01-27 DIAGNOSIS — R0600 Dyspnea, unspecified: Secondary | ICD-10-CM | POA: Insufficient documentation

## 2022-01-27 DIAGNOSIS — G4730 Sleep apnea, unspecified: Secondary | ICD-10-CM | POA: Insufficient documentation

## 2022-01-27 DIAGNOSIS — J90 Pleural effusion, not elsewhere classified: Secondary | ICD-10-CM | POA: Insufficient documentation

## 2022-01-27 DIAGNOSIS — R053 Chronic cough: Secondary | ICD-10-CM | POA: Diagnosis not present

## 2022-01-27 DIAGNOSIS — R0609 Other forms of dyspnea: Secondary | ICD-10-CM

## 2022-01-27 DIAGNOSIS — E119 Type 2 diabetes mellitus without complications: Secondary | ICD-10-CM | POA: Insufficient documentation

## 2022-01-27 LAB — ECHOCARDIOGRAM COMPLETE
Area-P 1/2: 3.41 cm2
S' Lateral: 2.8 cm

## 2022-03-01 ENCOUNTER — Telehealth: Payer: Self-pay | Admitting: *Deleted

## 2022-03-01 NOTE — Telephone Encounter (Signed)
ATC x1 regarding CPAP machine and SD card.  LVM to bring SD card if he has one.

## 2022-03-02 ENCOUNTER — Ambulatory Visit (INDEPENDENT_AMBULATORY_CARE_PROVIDER_SITE_OTHER): Payer: Medicare Other | Admitting: Pulmonary Disease

## 2022-03-02 ENCOUNTER — Encounter: Payer: Self-pay | Admitting: Pulmonary Disease

## 2022-03-02 VITALS — BP 110/70 | HR 76 | Temp 98.1°F | Ht 70.0 in | Wt 247.6 lb

## 2022-03-02 DIAGNOSIS — G4733 Obstructive sleep apnea (adult) (pediatric): Secondary | ICD-10-CM

## 2022-03-02 NOTE — Patient Instructions (Signed)
  X Rx for new autoCPAP 5-13 cm to [facility] EPR setting 2 Check download in 1 month

## 2022-03-02 NOTE — Progress Notes (Signed)
Subjective:    Patient ID: [name] [name], male    DOB: 1950-07-13, 71 y.o.   MRN: [id number]  HPI  71 yo diabetic, hypertensive presents to re-establish care for obstructive sleep apnea. He is brother-in-law [name], our nurse practitioner.  he was initially diagnosed around [place] office visit was in 2019.  He has been compliant with CPAP.  We got him a new machine in 2017.  He is on auto settings 10 to 18 cm.  He has lost about 30 pounds but definitely still needs his machine.  Reports sleepiness score is 3. Bedtime is between 8 and 9 PM, sleep latency is minimal, he sleeps on his side with 2 pillows, reports 3 nocturnal awakenings including nocturia and is out of bed at 5 AM feeling rested with severe dryness of mouth, no headaches There is no history suggestive of cataplexy, sleep paralysis or parasomnias  He reports a chronic cough that has been investigated by his PCP.  CT chest did not show any infiltrates or effusions  Significant tests/ events reviewed  CT chest without contrast 01/14/2022 minimal emphysema  1995 NPSG (wt=235#) w/ 53 events/hr & desat to 83%- started on CPAP... 2011  (wt=290#) >>new CPAP machine, last autotitrate showed optimal pressure= 13cmH2O.   Past Medical History:  Diagnosis Date   Arthritis    knees   Bursitis of left shoulder    Cancer ([place])    skin   Cervical disc disease    Chronic kidney disease    stage 3   Diabetes mellitus, type 2 ([place])    Diverticulosis of colon    Erectile dysfunction    Exogenous obesity    History of anemia    History of kidney stones    Hyperlipidemia    Hypertension    Internal hemorrhoids    Internal hemorrhoids with complication - fecal smearing 09/11/2018   Neuromuscular disorder (HCC)    neuropathy in feet   OSA (obstructive sleep apnea)    cpap   Testicular hypofunction     Past Surgical History:  Procedure Laterality Date   ANTERIOR CERVICAL DECOMP/DISCECTOMY FUSION  2008   some limit  to ROM   COLONOSCOPY     POSTERIOR LAMINECTOMY / DECOMPRESSION CERVICAL SPINE  2009   c5-6 fusion  Dr. Consuello Masse. with nerve damage to fingers Rt arm   UMBILICAL HERNIA REPAIR N/A 03/13/2021   Procedure: LAPAROSCOPIC UMBILICAL HERNIA REPAIR;  Surgeon: Johnathan Hausen, MD;  Location: [facility];  Service: General;  Laterality: N/A;    Allergies  Allergen Reactions   Amoxicillin Nausea And Vomiting   Dilaudid [Hydromorphone] Nausea And Vomiting   Lisinopril     Cough    Simvastatin     myalgia   Penicillins Rash    Social History   Socioeconomic History   Marital status: Married    Spouse name: Not on file   Number of children: 1   Years of education: Not on file   Highest education level: Not on file  Occupational History   Occupation: Librarian, academic of warehouse for [employer]   Occupation: SUPERVISOR    Employer: [employer]   Occupation: retired 2018  Tobacco Use   Smoking status: Former    Types: Cigarettes    Quit date: 01/20/1981    Years since quitting: 41.1   Smokeless tobacco: Never  Vaping Use   Vaping Use: Never used  Substance and Sexual Activity   Alcohol use: Yes    Alcohol/week: 7.0  standard drinks of alcohol    Types: 4 Glasses of wine, 3 Standard drinks or equivalent per week    Comment: He drinks 3-4 glasses of wine several times per week.   Drug use: No   Sexual activity: Yes  Other Topics Concern   Not on file  Social History Narrative   Lives with wife.  Has 1 son. Two story house   Right handed    Retired.  [employer].  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   Occasional alcohol former smoker no drug use   Social Determinants of Health   Financial Resource Strain: Low Risk  (12/24/2021)   Overall Financial Resource Strain (CARDIA)    Difficulty of Paying Living Expenses: Not hard at all  Food Insecurity: No Food Insecurity (12/24/2021)   Hunger Vital Sign    Worried About Running Out of Food in the Last  Year: Never true    Ran Out of Food in the Last Year: Never true  Transportation Needs: No Transportation Needs (12/24/2021)   PRAPARE - Hydrologist (Medical): No    Lack of Transportation (Non-Medical): No  Physical Activity: Inactive (12/24/2021)   Exercise Vital Sign    Days of Exercise per Week: 0 days    Minutes of Exercise per Session: 0 min  Stress: No Stress Concern Present (12/24/2021)   [place]    Feeling of Stress : Not at all  Social Connections: [place] (12/24/2021)   Social Connection and Isolation Panel [NHANES]    Frequency of Communication with Friends and Family: More than three times a week    Frequency of Social Gatherings with Friends and Family: More than three times a week    Attends Religious Services: More than 4 times per year    Active Member of [employer] or Organizations: Yes    Attends Music therapist: More than 4 times per year    Marital Status: Married  Human resources officer Violence: Not At Risk (12/24/2021)   Humiliation, Afraid, Rape, and Kick questionnaire    Fear of Current or Ex-Partner: No    Emotionally Abused: No    Physically Abused: No    Sexually Abused: No    Family History  Problem Relation Age of Onset   Heart disease Father        Died of MI at age 4   Breast cancer Mother    Diabetes Paternal Grandmother    Healthy Son    Colon cancer Neg Hx     Review of Systems   Constitutional: negative for anorexia, fevers and sweats  Eyes: negative for irritation, redness and visual disturbance  Ears, nose, mouth, throat, and face: negative for earaches, epistaxis, and sore throat  Respiratory: negative for cough, sputum and wheezing  Cardiovascular: negative for chest pain,  orthopnea, palpitations and syncope  Gastrointestinal: negative for abdominal pain, constipation, diarrhea, melena, nausea and vomiting   Genitourinary:negative for dysuria, frequency and hematuria  Hematologic/lymphatic: negative for bleeding, easy bruising and lymphadenopathy  Musculoskeletal:negative for arthralgias, muscle weakness and stiff joints  Neurological: negative for coordination problems, gait problems, headaches and weakness  Endocrine: negative for diabetic symptoms including polydipsia, polyuria and weight loss     Objective:   Physical Exam  Gen. Pleasant, obese, in no distress ENT - no lesions, no post nasal drip Neck: No JVD, no thyromegaly, no carotid bruits Lungs: no use of  accessory muscles, no dullness to percussion, decreased without rales or rhonchi  Cardiovascular: Rhythm regular, heart sounds  normal, no murmurs or gallops, no peripheral edema Musculoskeletal: No deformities, no cyanosis or clubbing , no tremors       Assessment & Plan:   Chronic cough -CT chest imaging was negative, likely etiology remains upper airway cough syndrome or GERD

## 2022-03-02 NOTE — Assessment & Plan Note (Signed)
CPAP download was reviewed which showed excellent control of events on auto settings 10 to 18 cm with average pressure of 15 cm and max of pressure 17 cm. He is very compliant and CPAP is certainly helped improve his daytime somnolence and fatigue. We will provide him with a replacement machine and keep him on auto settings, we will keep EPR setting at 2.  Hopefully this will help decrease his dryness. We discussed alternative therapies including inspire  Weight loss encouraged, compliance with goal of at least 4-6 hrs every night is the expectation. Advised against medications with sedative side effects Cautioned against driving when sleepy - understanding that sleepiness will vary on a day to day basis

## 2022-03-03 DIAGNOSIS — L570 Actinic keratosis: Secondary | ICD-10-CM | POA: Diagnosis not present

## 2022-03-03 DIAGNOSIS — C4442 Squamous cell carcinoma of skin of scalp and neck: Secondary | ICD-10-CM | POA: Diagnosis not present

## 2022-03-08 DIAGNOSIS — G4733 Obstructive sleep apnea (adult) (pediatric): Secondary | ICD-10-CM | POA: Diagnosis not present

## 2022-03-19 ENCOUNTER — Other Ambulatory Visit: Payer: Self-pay | Admitting: Internal Medicine

## 2022-03-30 ENCOUNTER — Encounter: Payer: Self-pay | Admitting: Internal Medicine

## 2022-04-07 ENCOUNTER — Other Ambulatory Visit: Payer: Self-pay | Admitting: Internal Medicine

## 2022-04-08 ENCOUNTER — Other Ambulatory Visit: Payer: Self-pay | Admitting: Internal Medicine

## 2022-04-08 DIAGNOSIS — G4733 Obstructive sleep apnea (adult) (pediatric): Secondary | ICD-10-CM | POA: Diagnosis not present

## 2022-04-19 DIAGNOSIS — G4733 Obstructive sleep apnea (adult) (pediatric): Secondary | ICD-10-CM | POA: Diagnosis not present

## 2022-05-08 DIAGNOSIS — G4733 Obstructive sleep apnea (adult) (pediatric): Secondary | ICD-10-CM | POA: Diagnosis not present

## 2022-05-25 DIAGNOSIS — E1165 Type 2 diabetes mellitus with hyperglycemia: Secondary | ICD-10-CM | POA: Diagnosis not present

## 2022-05-25 DIAGNOSIS — E7800 Pure hypercholesterolemia, unspecified: Secondary | ICD-10-CM | POA: Diagnosis not present

## 2022-05-25 DIAGNOSIS — R609 Edema, unspecified: Secondary | ICD-10-CM | POA: Diagnosis not present

## 2022-06-01 DIAGNOSIS — E1165 Type 2 diabetes mellitus with hyperglycemia: Secondary | ICD-10-CM | POA: Diagnosis not present

## 2022-06-01 DIAGNOSIS — E7800 Pure hypercholesterolemia, unspecified: Secondary | ICD-10-CM | POA: Diagnosis not present

## 2022-06-01 DIAGNOSIS — R609 Edema, unspecified: Secondary | ICD-10-CM | POA: Diagnosis not present

## 2022-06-01 DIAGNOSIS — I10 Essential (primary) hypertension: Secondary | ICD-10-CM | POA: Diagnosis not present

## 2022-06-02 DIAGNOSIS — L570 Actinic keratosis: Secondary | ICD-10-CM | POA: Diagnosis not present

## 2022-06-03 ENCOUNTER — Other Ambulatory Visit (HOSPITAL_COMMUNITY): Payer: Self-pay

## 2022-06-03 MED ORDER — OZEMPIC (0.25 OR 0.5 MG/DOSE) 2 MG/3ML ~~LOC~~ SOPN
0.5000 mg | PEN_INJECTOR | SUBCUTANEOUS | 5 refills | Status: DC
  Filled 2022-06-03: qty 3, 28d supply, fill #0
  Filled 2022-08-03: qty 3, 28d supply, fill #1
  Filled 2022-11-03 – 2023-02-01 (×2): qty 3, 28d supply, fill #2

## 2022-06-30 ENCOUNTER — Ambulatory Visit (INDEPENDENT_AMBULATORY_CARE_PROVIDER_SITE_OTHER): Payer: Medicare Other | Admitting: Internal Medicine

## 2022-06-30 ENCOUNTER — Encounter: Payer: Self-pay | Admitting: Internal Medicine

## 2022-06-30 VITALS — BP 148/68 | HR 77 | Temp 98.0°F | Ht 70.0 in | Wt 247.0 lb

## 2022-06-30 DIAGNOSIS — E785 Hyperlipidemia, unspecified: Secondary | ICD-10-CM

## 2022-06-30 DIAGNOSIS — N320 Bladder-neck obstruction: Secondary | ICD-10-CM

## 2022-06-30 DIAGNOSIS — R252 Cramp and spasm: Secondary | ICD-10-CM | POA: Diagnosis not present

## 2022-06-30 DIAGNOSIS — N189 Chronic kidney disease, unspecified: Secondary | ICD-10-CM | POA: Diagnosis not present

## 2022-06-30 DIAGNOSIS — R053 Chronic cough: Secondary | ICD-10-CM

## 2022-06-30 DIAGNOSIS — E1165 Type 2 diabetes mellitus with hyperglycemia: Secondary | ICD-10-CM | POA: Diagnosis not present

## 2022-06-30 DIAGNOSIS — Z794 Long term (current) use of insulin: Secondary | ICD-10-CM

## 2022-06-30 DIAGNOSIS — F439 Reaction to severe stress, unspecified: Secondary | ICD-10-CM | POA: Diagnosis not present

## 2022-06-30 MED ORDER — HYDROCOD POLI-CHLORPHE POLI ER 10-8 MG/5ML PO SUER: 5.0000 mL | Freq: Two times a day (BID) | ORAL | 0 refills | Status: DC | PRN

## 2022-06-30 MED ORDER — TRIAMCINOLONE ACETONIDE 0.5 % EX OINT: 1.0000 | TOPICAL_OINTMENT | Freq: Four times a day (QID) | CUTANEOUS | 3 refills | Status: AC | PRN

## 2022-06-30 NOTE — Assessment & Plan Note (Signed)
Use Magnesium oil spray for muscle cramps

## 2022-06-30 NOTE — Assessment & Plan Note (Signed)
Ongoing stress - wife has metastatic breast cancer

## 2022-06-30 NOTE — Patient Instructions (Signed)
Pitavastatin for cholesterol  Use Magnesium oil spray for muscle cramps

## 2022-06-30 NOTE — Progress Notes (Signed)
Subjective:  Patient ID: [name] [name], male    DOB: 11-22-1950  Age: 72 y.o. MRN: [id number]  CC: No chief complaint on file.   HPI Harveer Edgerson presents for CRI, HTN, DM C/o chronic cough C/o leg cramps - worse   Outpatient Medications Prior to Visit  Medication Sig Dispense Refill   amLODipine (NORVASC) 5 MG tablet TAKE 1 TABLET BY MOUTH DAILY 90 tablet 3   B-D ULTRAFINE III SHORT PEN 31G X 8 MM MISC USE AS DIRECTED 200 each 3   Cholecalciferol (VITAMIN D) 50 MCG (2000 UT) tablet Take 2,000 Units by mouth daily.     clobetasol (TEMOVATE) 0.05 % external solution Apply 1 application. topically 2 (two) times daily. 50 mL 2   cloNIDine (CATAPRES) 0.1 MG tablet TAKE 1 TABLET BY MOUTH THREE TIMES DAILY AS NEEDED. TAKE IF SYSTOLIC BLOOD 123XX123 90 tablet 2   Continuous Blood Gluc Sensor (FREESTYLE LIBRE 14 DAY SENSOR) MISC USE AS DIRECTED EVERY 14 DAYS     doxylamine, Sleep, (UNISOM) 25 MG tablet Take 25 mg by mouth at bedtime as needed for sleep.     furosemide (LASIX) 20 MG tablet Take 1 tablet (20 mg total) by mouth daily. 30 tablet 5   gabapentin (NEURONTIN) 600 MG tablet TAKE 1 TABLET(600 MG) BY MOUTH TWICE DAILY 180 tablet 3   glimepiride (AMARYL) 2 MG tablet Take 2 mg by mouth daily.     insulin glargine, 1 Unit Dial, (TOUJEO) 300 UNIT/ML Solostar Pen Inject 48 Units into the skin daily before breakfast.     JARDIANCE 10 MG TABS tablet TAKE 1 TABLET BY MOUTH DAILY 90 tablet 3   losartan (COZAAR) 100 MG tablet TAKE 1 TABLET BY MOUTH DAILY 90 tablet 3   losartan (COZAAR) 100 MG tablet TAKE 1 TABLET BY MOUTH DAILY 90 tablet 3   metoprolol succinate (TOPROL-XL) 25 MG 24 hr tablet TAKE 1 TABLET(25 MG) BY MOUTH IN THE MORNING AND AT BEDTIME 180 tablet 3   ondansetron (ZOFRAN) 4 MG tablet Take 1 tablet (4 mg total) by mouth every 8 (eight) hours as needed for nausea or vomiting. 20 tablet 0   OZEMPIC, 0.25 OR 0.5 MG/DOSE, 2 MG/1.5ML SOPN Inject 0.25 mg into the skin every  Tuesday.     rosuvastatin (CRESTOR) 40 MG tablet Take 40 mg by mouth at bedtime.     Semaglutide,0.25 or 0.5MG/DOS, (OZEMPIC, 0.25 OR 0.5 MG/DOSE,) 2 MG/3ML SOPN Inject 0.5 mg into the skin once a week. 3 mL 5   tamsulosin (FLOMAX) 0.4 MG CAPS capsule Take 1 capsule (0.4 mg total) by mouth daily after supper. 90 capsule 3   chlorpheniramine-HYDROcodone (TUSSIONEX) 10-8 MG/5ML Take 5 mLs by mouth every 12 (twelve) hours as needed for cough. 230 mL 0   No facility-administered medications prior to visit.    ROS: Review of Systems  Constitutional:  Negative for appetite change, fatigue and unexpected weight change.  HENT:  Negative for congestion, nosebleeds, sneezing, sore throat and trouble swallowing.   Eyes:  Negative for itching and visual disturbance.  Respiratory:  Positive for cough.   Cardiovascular:  Negative for chest pain, palpitations and leg swelling.  Gastrointestinal:  Negative for abdominal distention, blood in stool, diarrhea and nausea.  Genitourinary:  Negative for frequency and hematuria.  Musculoskeletal:  Positive for arthralgias and back pain. Negative for gait problem, joint swelling and neck pain.  Skin:  Negative for rash.  Neurological:  Negative for dizziness, tremors, speech difficulty and weakness.  Psychiatric/Behavioral:  Negative for agitation, dysphoric mood and sleep disturbance. The patient is not nervous/anxious.     Objective:  BP (!) 148/68 (BP Location: Right Arm, Patient Position: Sitting, Cuff Size: Normal)   Pulse 77   Temp 98 F (36.7 C) (Oral)   Ht 5' 10"$  (1.778 m)   Wt 247 lb (112 kg)   SpO2 98%   BMI 35.44 kg/m   BP Readings from Last 3 Encounters:  06/30/22 (!) 148/68  03/02/22 110/70  12/28/21 118/72    Wt Readings from Last 3 Encounters:  06/30/22 247 lb (112 kg)  03/02/22 247 lb 9.6 oz (112.3 kg)  12/28/21 246 lb 9.6 oz (111.9 kg)    Physical Exam Constitutional:      General: He is not in acute distress.     Appearance: He is well-developed. He is obese.     Comments: NAD  Eyes:     Conjunctiva/sclera: Conjunctivae normal.     Pupils: Pupils are equal, round, and reactive to light.  Neck:     Thyroid: No thyromegaly.     Vascular: No JVD.  Cardiovascular:     Rate and Rhythm: Normal rate and regular rhythm.     Heart sounds: Normal heart sounds. No murmur heard.    No friction rub. No gallop.  Pulmonary:     Effort: Pulmonary effort is normal. No respiratory distress.     Breath sounds: Normal breath sounds. No wheezing or rales.  Chest:     Chest wall: No tenderness.  Abdominal:     General: Bowel sounds are normal. There is no distension.     Palpations: Abdomen is soft. There is no mass.     Tenderness: There is no abdominal tenderness. There is no guarding or rebound.  Musculoskeletal:        General: No tenderness. Normal range of motion.     Cervical back: Normal range of motion.  Lymphadenopathy:     Cervical: No cervical adenopathy.  Skin:    General: Skin is warm and dry.     Findings: No rash.  Neurological:     Mental Status: He is alert and oriented to person, place, and time.     Cranial Nerves: No cranial nerve deficit.     Motor: No abnormal muscle tone.     Coordination: Coordination normal.     Gait: Gait normal.     Deep Tendon Reflexes: Reflexes are normal and symmetric.  Psychiatric:        Behavior: Behavior normal.        Thought Content: Thought content normal.        Judgment: Judgment normal.     Lab Results  Component Value Date   WBC 6.6 02/25/2021   HGB 13.6 02/25/2021   HCT 41.9 02/25/2021   PLT 264 02/25/2021   GLUCOSE 143 (H) 02/25/2021   CHOL 165 08/19/2020   TRIG 111.0 08/19/2020   HDL 44.60 08/19/2020   LDLDIRECT 200.0 02/17/2016   LDLCALC 98 08/19/2020   ALT 14 08/19/2020   AST 14 08/19/2020   NA 139 02/25/2021   K 4.0 02/25/2021   CL 104 02/25/2021   CREATININE 1.54 (H) 02/25/2021   BUN 36 (H) 02/25/2021   CO2 25 02/25/2021    TSH 3.46 08/19/2020   PSA 0.73 02/04/2021   INR 1.08 02/02/2011   HGBA1C 7.2 (H) 02/25/2021   MICROALBUR 182.1 (H) 08/19/2020    ECHOCARDIOGRAM COMPLETE  Result Date: 01/27/2022    ECHOCARDIOGRAM  REPORT   Patient Name:   JADARRIUS DUHART Date of Exam: 01/27/2022 Medical Rec #:  [id number]       Height:       69.0 in Accession #:    [id number]      Weight:       246.6 lb Date of Birth:  02-15-1951       BSA:          2.258 m Patient Age:    64 years        BP:           118/72 mmHg Patient Gender: M               HR:           69 bpm. Exam Location:  Outpatient Procedure: 2D Echo, 3D Echo, Cardiac Doppler and Color Doppler Indications:    R06.09 Dyspnea  History:        Patient has prior history of Echocardiogram examinations, most                 recent 12/01/2010. Signs/Symptoms:Dyspnea; Risk Factors:Sleep                 Apnea, Former Smoker, Dyslipidemia, Diabetes, Hypertension and                 Family History of Coronary Artery Disease. Left Pleural Effusion                 on CT, Obesity.  Sonographer:    Deliah Boston RDCS Referring Phys: [place]  1. Left ventricular ejection fraction, by estimation, is 60 to 65%. The left ventricle has normal function. The left ventricle has no regional wall motion abnormalities. There is mild left ventricular hypertrophy. Left ventricular diastolic parameters are indeterminate.  2. Right ventricular systolic function is normal. The right ventricular size is normal. Tricuspid regurgitation signal is inadequate for assessing PA pressure.  3. Left atrial size was mildly dilated.  4. The mitral valve is degenerative. No evidence of mitral valve regurgitation. No evidence of mitral stenosis. Moderate mitral annular calcification.  5. The aortic valve is tricuspid. Aortic valve regurgitation is not visualized. Aortic valve sclerosis is present, with no evidence of aortic valve stenosis.  6. The inferior vena cava is normal in size with  greater than 50% respiratory variability, suggesting right atrial pressure of 3 mmHg. FINDINGS  Left Ventricle: Left ventricular ejection fraction, by estimation, is 60 to 65%. The left ventricle has normal function. The left ventricle has no regional wall motion abnormalities. The left ventricular internal cavity size was normal in size. There is  mild left ventricular hypertrophy. Left ventricular diastolic parameters are indeterminate. Right Ventricle: The right ventricular size is normal. No increase in right ventricular wall thickness. Right ventricular systolic function is normal. Tricuspid regurgitation signal is inadequate for assessing PA pressure. Left Atrium: Left atrial size was mildly dilated. Right Atrium: Right atrial size was normal in size. Pericardium: Trivial pericardial effusion is present. Mitral Valve: The mitral valve is degenerative in appearance. Moderate mitral annular calcification. No evidence of mitral valve regurgitation. No evidence of mitral valve stenosis. Tricuspid Valve: The tricuspid valve is normal in structure. Tricuspid valve regurgitation is trivial. Aortic Valve: The aortic valve is tricuspid. Aortic valve regurgitation is not visualized. Aortic valve sclerosis is present, with no evidence of aortic valve stenosis. Pulmonic Valve: The pulmonic valve was not well visualized. Pulmonic valve regurgitation is not visualized. Aorta: The  aortic root and ascending aorta are structurally normal, with no evidence of dilitation. Venous: The inferior vena cava is normal in size with greater than 50% respiratory variability, suggesting right atrial pressure of 3 mmHg. IAS/Shunts: The interatrial septum was not well visualized.  LEFT VENTRICLE PLAX 2D LVIDd:         5.40 cm   Diastology LVIDs:         2.80 cm   LV e' medial:    8.05 cm/s LV PW:         1.20 cm   LV E/e' medial:  11.3 LV IVS:        1.10 cm   LV e' lateral:   9.57 cm/s LVOT diam:     2.50 cm   LV E/e' lateral: 9.5 LV SV:          122 LV SV Index:   54 LVOT Area:     4.91 cm                           3D Volume EF:                          3D EF:        67 %                          LV EDV:       222 ml                          LV ESV:       73 ml                          LV SV:        149 ml RIGHT VENTRICLE RV Basal diam:  3.95 cm RV Mid diam:    3.80 cm RV S prime:     16.40 cm/s TAPSE (M-mode): 2.9 cm LEFT ATRIUM             Index        RIGHT ATRIUM           Index LA diam:        4.50 cm 1.99 cm/m   RA Area:     19.60 cm LA Vol (A2C):   94.3 ml 41.76 ml/m  RA Volume:   54.60 ml  24.18 ml/m LA Vol (A4C):   81.4 ml 36.04 ml/m LA Biplane Vol: 92.0 ml 40.74 ml/m  AORTIC VALVE LVOT Vmax:   106.00 cm/s LVOT Vmean:  71.100 cm/s LVOT VTI:    0.248 m  AORTA Ao Root diam: 3.70 cm Ao Asc diam:  3.70 cm MITRAL VALVE MV Area (PHT): cm         SHUNTS MV Decel Time: 223 msec    Systemic VTI:  0.25 m MV E velocity: 91.35 cm/s  Systemic Diam: 2.50 cm MV A velocity: 84.50 cm/s MV E/A ratio:  1.08 Oswaldo Milian MD Electronically signed by Oswaldo Milian MD Signature Date/Time: 01/27/2022/1:57:37 PM    Final     Assessment & Plan:   Problem List Items Addressed This Visit       Endocrine   Type 2 diabetes mellitus with hyperglycemia ([place]) - Primary    Dr Chalmers Cater A1c 8.0% 06/2022  Other   Stress at home    Ongoing stress - wife has metastatic breast cancer      Dyslipidemia    Hold Crestor if cramps Use Magnesium oil spray for muscle cramps        Cramps of lower extremity    Use Magnesium oil spray for muscle cramps      Cough    Recurrent/ chronic Tussionex prn - rare use  Potential benefits of opioids use as well as potential risks (i.e. addiction risk, apnea etc) and complications (i.e. Somnolence, constipation and others) were explained to the patient and were aknowledged.      Other Visit Diagnoses     Bladder neck obstruction       Relevant Orders   PSA         Meds ordered  this encounter  Medications   chlorpheniramine-HYDROcodone (TUSSIONEX) 10-8 MG/5ML    Sig: Take 5 mLs by mouth every 12 (twelve) hours as needed for cough.    Dispense:  230 mL    Refill:  0   triamcinolone ointment (KENALOG) 0.5 %    Sig: Apply 1 Application topically 4 (four) times daily as needed.    Dispense:  120 g    Refill:  3      Follow-up: Return in about 6 months (around 12/29/2022) for a follow-up visit.  Walker Kehr, MD

## 2022-06-30 NOTE — Assessment & Plan Note (Signed)
Recurrent/ chronic Tussionex prn - rare use  Potential benefits of opioids use as well as potential risks (i.e. addiction risk, apnea etc) and complications (i.e. Somnolence, constipation and others) were explained to the patient and were aknowledged.

## 2022-06-30 NOTE — Assessment & Plan Note (Signed)
Dr Chalmers Cater A1c 8.0% 06/2022

## 2022-06-30 NOTE — Assessment & Plan Note (Signed)
Hold Crestor if cramps Use Magnesium oil spray for muscle cramps

## 2022-07-01 ENCOUNTER — Emergency Department (HOSPITAL_BASED_OUTPATIENT_CLINIC_OR_DEPARTMENT_OTHER): Payer: Medicare Other

## 2022-07-01 ENCOUNTER — Emergency Department (HOSPITAL_BASED_OUTPATIENT_CLINIC_OR_DEPARTMENT_OTHER)
Admission: EM | Admit: 2022-07-01 | Discharge: 2022-07-01 | Disposition: A | Discharge status: Home / Self Care | Payer: Medicare Other | Attending: Emergency Medicine | Admitting: Emergency Medicine

## 2022-07-01 ENCOUNTER — Other Ambulatory Visit: Payer: Self-pay

## 2022-07-01 ENCOUNTER — Encounter (HOSPITAL_BASED_OUTPATIENT_CLINIC_OR_DEPARTMENT_OTHER): Payer: Self-pay | Admitting: Urology

## 2022-07-01 DIAGNOSIS — M79651 Pain in right thigh: Secondary | ICD-10-CM

## 2022-07-01 DIAGNOSIS — I10 Essential (primary) hypertension: Secondary | ICD-10-CM

## 2022-07-01 DIAGNOSIS — M62838 Other muscle spasm: Secondary | ICD-10-CM | POA: Diagnosis not present

## 2022-07-01 DIAGNOSIS — M545 Low back pain, unspecified: Secondary | ICD-10-CM | POA: Diagnosis not present

## 2022-07-01 DIAGNOSIS — Z79899 Other long term (current) drug therapy: Secondary | ICD-10-CM | POA: Insufficient documentation

## 2022-07-01 DIAGNOSIS — M79661 Pain in right lower leg: Secondary | ICD-10-CM | POA: Diagnosis not present

## 2022-07-01 DIAGNOSIS — M25551 Pain in right hip: Secondary | ICD-10-CM | POA: Diagnosis not present

## 2022-07-01 DIAGNOSIS — R030 Elevated blood-pressure reading, without diagnosis of hypertension: Secondary | ICD-10-CM

## 2022-07-01 DIAGNOSIS — Z794 Long term (current) use of insulin: Secondary | ICD-10-CM | POA: Diagnosis not present

## 2022-07-01 MED ORDER — ACETAMINOPHEN 500 MG PO TABS
1000.0000 mg | ORAL_TABLET | Freq: Once | ORAL | Status: AC
  Administered 2022-07-01: 1000 mg via ORAL
  Filled 2022-07-01: qty 2

## 2022-07-01 MED ORDER — IBUPROFEN 400 MG PO TABS
400.0000 mg | ORAL_TABLET | Freq: Once | ORAL | Status: AC
  Administered 2022-07-01: 400 mg via ORAL
  Filled 2022-07-01: qty 1

## 2022-07-01 MED ORDER — METHOCARBAMOL 750 MG PO TABS: 750.0000 mg | ORAL_TABLET | Freq: Three times a day (TID) | ORAL | 0 refills | Status: DC | PRN

## 2022-07-01 NOTE — ED Triage Notes (Signed)
Pt states approx 5 days ago had strong leg cramp during the night States today started having worsening leg pain from left hip down to knee  Pain is worse in inner thigh, states concern for DVT

## 2022-07-01 NOTE — Discharge Instructions (Addendum)
It was our pleasure to provide your ER care today - we hope that you feel better.  Take acetaminophen or ibuprofen as need. You may also try robaxin as need for muscle pain/spasm - no driving when taking.  Follow up with primary care doctor in the next couple weeks.  Return to ER if worse, new symptoms, severe or intractable pain, numbness/weakness, increased swelling or other concern.

## 2022-07-01 NOTE — ED Provider Notes (Signed)
[facility] [facility] Provider Note   CSN: [id number] Arrival date & time: 07/01/22  1538     History  Chief Complaint  Patient presents with   Leg Pain    [name] [name] is a 72 y.o. male.  Pt c/o intermittent right hip/anterior/lateral thigh area pain in past couple weeks. Symptoms acute onset, ?worse w certain movements or positional changes. Denies recent trauma or injury to area. No RLE swelling. No numbness/weakness. No skin lesions, redness or swelling in area of pain. No abd pain. No scrotal or testicular area pain. Occasional pain in low back and right buttock area. ?hx ddd in neck/upper back area. No claudication or calf pain. No fever or chills.   The history is provided by the patient and medical records.  Leg Pain Associated symptoms: no fever and no neck pain        Home Medications Prior to Admission medications   Medication Sig Start Date End Date Taking? Authorizing Provider  amLODipine (NORVASC) 5 MG tablet TAKE 1 TABLET BY MOUTH DAILY 04/08/22   Plotnikov, Evie Lacks, MD  B-D ULTRAFINE III SHORT PEN 31G X 8 MM MISC USE AS DIRECTED 12/29/17   Plotnikov, Evie Lacks, MD  chlorpheniramine-HYDROcodone (TUSSIONEX) 10-8 MG/5ML Take 5 mLs by mouth every 12 (twelve) hours as needed for cough. 06/30/22   Plotnikov, Evie Lacks, MD  Cholecalciferol (VITAMIN D) 50 MCG (2000 UT) tablet Take 2,000 Units by mouth daily.    [provider]  clobetasol (TEMOVATE) 0.05 % external solution Apply 1 application. topically 2 (two) times daily. 08/26/21   Plotnikov, Evie Lacks, MD  cloNIDine (CATAPRES) 0.1 MG tablet TAKE 1 TABLET BY MOUTH THREE TIMES DAILY AS NEEDED. TAKE IF SYSTOLIC BLOOD 123XX123 11/26/20   Plotnikov, Evie Lacks, MD  Continuous Blood Gluc Sensor (FREESTYLE LIBRE 14 DAY SENSOR) MISC USE AS DIRECTED EVERY 14 DAYS 03/23/21   [provider]  doxylamine, Sleep, (UNISOM) 25 MG tablet Take 25 mg by mouth at bedtime as  needed for sleep.    [provider]  furosemide (LASIX) 20 MG tablet Take 1 tablet (20 mg total) by mouth daily. 12/31/21   Plotnikov, Evie Lacks, MD  gabapentin (NEURONTIN) 600 MG tablet TAKE 1 TABLET(600 MG) BY MOUTH TWICE DAILY 12/29/21   Plotnikov, Evie Lacks, MD  glimepiride (AMARYL) 2 MG tablet Take 2 mg by mouth daily.    [provider]  insulin glargine, 1 Unit Dial, (TOUJEO) 300 UNIT/ML Solostar Pen Inject 48 Units into the skin daily before breakfast.    [provider]  JARDIANCE 10 MG TABS tablet TAKE 1 TABLET BY MOUTH DAILY 03/19/22   Plotnikov, Evie Lacks, MD  losartan (COZAAR) 100 MG tablet TAKE 1 TABLET BY MOUTH DAILY 04/08/22   Plotnikov, Evie Lacks, MD  losartan (COZAAR) 100 MG tablet TAKE 1 TABLET BY MOUTH DAILY 04/08/22   Plotnikov, Evie Lacks, MD  metoprolol succinate (TOPROL-XL) 25 MG 24 hr tablet TAKE 1 TABLET(25 MG) BY MOUTH IN THE MORNING AND AT BEDTIME 08/18/21   Plotnikov, Evie Lacks, MD  ondansetron (ZOFRAN) 4 MG tablet Take 1 tablet (4 mg total) by mouth every 8 (eight) hours as needed for nausea or vomiting. 03/13/21   Johnathan Hausen, MD  OZEMPIC, 0.25 OR 0.5 MG/DOSE, 2 MG/1.5ML SOPN Inject 0.25 mg into the skin every Tuesday. 05/19/20   [provider]  rosuvastatin (CRESTOR) 40 MG tablet Take 40 mg by mouth at bedtime.    [provider]  Semaglutide,0.25 or 0.5MG/DOS, (OZEMPIC, 0.25 OR 0.5 MG/DOSE,) 2 MG/3ML SOPN Inject 0.5 mg into the skin once a week. 06/02/22     tamsulosin (FLOMAX) 0.4 MG CAPS capsule Take 1 capsule (0.4 mg total) by mouth daily after supper. 12/30/21   Plotnikov, Evie Lacks, MD  triamcinolone ointment (KENALOG) 0.5 % Apply 1 Application topically 4 (four) times daily as needed. 06/30/22 06/30/23  Plotnikov, Evie Lacks, MD      Allergies    Amoxicillin, Dilaudid [hydromorphone], Lisinopril, Simvastatin, and Penicillins    Review of Systems   Review of Systems  Constitutional:  Negative for fever.  Respiratory:   Negative for shortness of breath.   Cardiovascular:  Negative for chest pain.  Gastrointestinal:  Negative for abdominal pain.  Genitourinary:  Negative for flank pain, scrotal swelling and testicular pain.  Musculoskeletal:  Negative for neck pain.  Skin:  Negative for rash.  Neurological:  Negative for weakness and numbness.  Hematological:  Does not bruise/bleed easily.  Psychiatric/Behavioral:  Negative for confusion.     Physical Exam Updated Vital Signs BP (!) 158/66 (BP Location: Left Arm)   Pulse 90   Temp 98.6 F (37 C) (Oral)   Resp 20   Ht 1.778 m (5' 10"$ )   Wt 112 kg   SpO2 97%   BMI 35.43 kg/m  Physical Exam Vitals and nursing note reviewed.  Constitutional:      Appearance: Normal appearance. He is well-developed.  HENT:     Head: Atraumatic.     Nose: Nose normal.     Mouth/Throat:     Mouth: Mucous membranes are moist.  Eyes:     General: No scleral icterus.    Conjunctiva/sclera: Conjunctivae normal.  Neck:     Trachea: No tracheal deviation.  Cardiovascular:     Rate and Rhythm: Normal rate and regular rhythm.     Pulses: Normal pulses.     Heart sounds: Normal heart sounds. No murmur heard.    No friction rub. No gallop.  Pulmonary:     Effort: Pulmonary effort is normal. No accessory muscle usage or respiratory distress.     Breath sounds: Normal breath sounds.  Abdominal:     General: There is no distension.     Palpations: Abdomen is soft. There is no mass.     Tenderness: There is no abdominal tenderness.  Genitourinary:    Comments: L/S spine non tender, aligned.  Mild right lumbar and right sciatic notch area muscular tenderness. No sts or skin changes/lesions to area of pain. Fem and RLE pulses 2+. RLE of normal appearance/color and warmth. No pain w passive rom at hip or knee. No sts noted.  Musculoskeletal:        General: No swelling or tenderness.     Cervical back: Neck supple.  Skin:    General: Skin is warm and dry.      Findings: No rash.  Neurological:     Mental Status: He is alert.     Comments: Alert, speech clear. RLE motor/sens grossly intact.   Psychiatric:        Mood and Affect: Mood normal.     ED Results / Procedures / Treatments   Labs (all labs ordered are listed, but only abnormal results are displayed) Labs Reviewed - No data to display  EKG None  Radiology DG Lumbar Spine Complete  Result Date: 07/01/2022 CLINICAL DATA:  Low back pain, initial encounter EXAM: LUMBAR SPINE - COMPLETE 4+ VIEW COMPARISON:  None  Available. FINDINGS: Five lumbar type vertebral bodies are well visualized. Vertebral body height is well maintained. No pars defects are noted. No anterolisthesis is seen. Multi level osteophytic change and facet hypertrophic changes are noted. No soft tissue abnormality is seen. IMPRESSION: Degenerative change without acute abnormality. Electronically Signed   By: Inez Catalina M.D.   On: 07/01/2022 18:06   DG HIP UNILAT W OR W/O PELVIS 2-3 VIEWS RIGHT  Result Date: 07/01/2022 CLINICAL DATA:  Right hip pain, no known injury, initial encounter EXAM: DG HIP (WITH OR WITHOUT PELVIS) 3V RIGHT COMPARISON:  None Available. FINDINGS: Pelvic ring is intact. No acute fracture or dislocation is noted. No soft tissue abnormality is seen. Degenerative changes of the hip joints are noted bilaterally. IMPRESSION: Mild degenerative change without acute abnormality. Electronically Signed   By: Inez Catalina M.D.   On: 07/01/2022 18:06   US Venous Img Lower Unilateral Right  Result Date: 07/01/2022 CLINICAL DATA:  Right thigh pain EXAM: RIGHT LOWER EXTREMITY VENOUS DOPPLER ULTRASOUND TECHNIQUE: Gray-scale sonography with compression, as well as color and duplex ultrasound, were performed to evaluate the deep venous system(s) from the level of the common femoral vein through the popliteal and proximal calf veins. COMPARISON:  None Available. FINDINGS: VENOUS Normal compressibility of the common  femoral, superficial femoral, and popliteal veins, as well as the visualized calf veins. Visualized portions of profunda femoral vein and great saphenous vein unremarkable. No filling defects to suggest DVT on grayscale or color Doppler imaging. Doppler waveforms show normal direction of venous flow, normal respiratory plasticity and response to augmentation. Limited views of the contralateral common femoral vein are unremarkable. OTHER None. Limitations: none IMPRESSION: Negative. Electronically Signed   By: Jacqulynn Cadet M.D.   On: 07/01/2022 16:53    Procedures Procedures    Medications Ordered in ED Medications  acetaminophen (TYLENOL) tablet 1,000 mg (has no administration in time range)  ibuprofen (ADVIL) tablet 400 mg (has no administration in time range)    ED Course/ Medical Decision Making/ A&P                             Medical Decision Making Problems Addressed: Elevated blood pressure reading: acute illness or injury Essential hypertension: chronic illness or injury with exacerbation, progression, or side effects of treatment that poses a threat to life or bodily functions Muscle spasm: acute illness or injury with systemic symptoms Pain in right thigh: acute illness or injury  Amount and/or Complexity of Data Reviewed Independent Historian:     Details: family External Data Reviewed: notes. Radiology: ordered and independent interpretation performed. Decision-making details documented in ED Course.  Risk OTC drugs. Prescription drug management.  Pt concerned for possible dvt - dvt study ordered from triage and neg.   Ibuprofen/acetaminophen po.  Reviewed nursing notes and prior charts for additional history.   Xrays reviewed/interpreted by me - no dvt. No fx. + Degen changes.   Rec ibuprofen/acetaminophen prn. Robaxin prn. And rec pcp f/u.  Return precautions provided.           Final Clinical Impression(s) / ED Diagnoses Final diagnoses:   Pain in right thigh  Muscle spasm  Elevated blood pressure reading  Essential hypertension    Rx / DC Orders ED Discharge Orders     None         Lajean Saver, MD 07/01/22 1900

## 2022-07-02 ENCOUNTER — Encounter: Payer: Self-pay | Admitting: Internal Medicine

## 2022-07-07 ENCOUNTER — Ambulatory Visit (INDEPENDENT_AMBULATORY_CARE_PROVIDER_SITE_OTHER): Payer: Medicare Other | Admitting: Internal Medicine

## 2022-07-07 ENCOUNTER — Encounter: Payer: Self-pay | Admitting: Internal Medicine

## 2022-07-07 VITALS — BP 120/64 | HR 79 | Temp 98.1°F | Ht 70.0 in | Wt 245.0 lb

## 2022-07-07 DIAGNOSIS — M48062 Spinal stenosis, lumbar region with neurogenic claudication: Secondary | ICD-10-CM

## 2022-07-07 DIAGNOSIS — M5431 Sciatica, right side: Secondary | ICD-10-CM

## 2022-07-07 MED ORDER — METHYLPREDNISOLONE ACETATE 80 MG/ML IJ SUSP
80.0000 mg | Freq: Once | INTRAMUSCULAR | Status: AC
  Administered 2022-07-07: 80 mg via INTRAMUSCULAR

## 2022-07-07 MED ORDER — METHOCARBAMOL 750 MG PO TABS: 750.0000 mg | ORAL_TABLET | Freq: Three times a day (TID) | ORAL | 1 refills | Status: DC | PRN

## 2022-07-07 MED ORDER — METHYLPREDNISOLONE 4 MG PO TBPK: ORAL_TABLET | ORAL | 0 refills | Status: DC

## 2022-07-07 MED ORDER — OXYCODONE-ACETAMINOPHEN 10-325 MG PO TABS: 1.0000 | ORAL_TABLET | Freq: Four times a day (QID) | ORAL | 0 refills | Status: DC | PRN

## 2022-07-07 MED ORDER — DIAZEPAM 5 MG PO TABS: 5.0000 mg | ORAL_TABLET | Freq: Three times a day (TID) | ORAL | 1 refills | Status: DC | PRN

## 2022-07-07 MED ORDER — HYDROCOD POLI-CHLORPHE POLI ER 10-8 MG/5ML PO SUER: 5.0000 mL | Freq: Two times a day (BID) | ORAL | 0 refills | Status: DC | PRN

## 2022-07-07 NOTE — Assessment & Plan Note (Signed)
Severe pain  R leg pain x 1 week. Pain starts after supper - 9/10 in the R thigh and knee. The leg goes numb down to the foot - weak too "dead". Laying makes it worse. Mild LBP.  Vicodin, Methocarbamol - not much better   Rx: Methocarbamol, Percocet, medrol pack. Diazepam prn w/caution MRI LS spine

## 2022-07-07 NOTE — Addendum Note (Signed)
Addended by: Basil Dess on: 07/07/2022 03:21 PM   Modules accepted: Orders

## 2022-07-07 NOTE — Progress Notes (Signed)
Subjective:  Patient ID: [name] [name], male    DOB: February 12, 1951  Age: 72 y.o. MRN: [id number]  CC: Pain (Started last week sharp pain in leg and thigh on right leg went to er and had ultrasound )   HPI Hykeem Fane presents for severe R leg pain x 1 week. Pain starts after supper - 9/10 in the R thigh and knee. The leg goes numb down to the foot - weak too "dead". Laying makes it worse. Mild LBP.  Vicodin, Methocarbamol - not better  Outpatient Medications Prior to Visit  Medication Sig Dispense Refill   amLODipine (NORVASC) 5 MG tablet TAKE 1 TABLET BY MOUTH DAILY 90 tablet 3   B-D ULTRAFINE III SHORT PEN 31G X 8 MM MISC USE AS DIRECTED 200 each 3   chlorpheniramine-HYDROcodone (TUSSIONEX) 10-8 MG/5ML Take 5 mLs by mouth every 12 (twelve) hours as needed for cough. 230 mL 0   Cholecalciferol (VITAMIN D) 50 MCG (2000 UT) tablet Take 2,000 Units by mouth daily.     clobetasol (TEMOVATE) 0.05 % external solution Apply 1 application. topically 2 (two) times daily. 50 mL 2   cloNIDine (CATAPRES) 0.1 MG tablet TAKE 1 TABLET BY MOUTH THREE TIMES DAILY AS NEEDED. TAKE IF SYSTOLIC BLOOD 123XX123 90 tablet 2   Continuous Blood Gluc Sensor (FREESTYLE LIBRE 14 DAY SENSOR) MISC USE AS DIRECTED EVERY 14 DAYS     doxylamine, Sleep, (UNISOM) 25 MG tablet Take 25 mg by mouth at bedtime as needed for sleep.     furosemide (LASIX) 20 MG tablet Take 1 tablet (20 mg total) by mouth daily. 30 tablet 5   gabapentin (NEURONTIN) 600 MG tablet TAKE 1 TABLET(600 MG) BY MOUTH TWICE DAILY 180 tablet 3   glimepiride (AMARYL) 2 MG tablet Take 2 mg by mouth daily.     insulin glargine, 1 Unit Dial, (TOUJEO) 300 UNIT/ML Solostar Pen Inject 48 Units into the skin daily before breakfast.     JARDIANCE 10 MG TABS tablet TAKE 1 TABLET BY MOUTH DAILY 90 tablet 3   losartan (COZAAR) 100 MG tablet TAKE 1 TABLET BY MOUTH DAILY 90 tablet 3   losartan (COZAAR) 100 MG tablet TAKE 1 TABLET BY MOUTH DAILY 90 tablet 3    methocarbamol (ROBAXIN) 750 MG tablet Take 1 tablet (750 mg total) by mouth 3 (three) times daily as needed (muscle spasm/pain). 15 tablet 0   metoprolol succinate (TOPROL-XL) 25 MG 24 hr tablet TAKE 1 TABLET(25 MG) BY MOUTH IN THE MORNING AND AT BEDTIME 180 tablet 3   ondansetron (ZOFRAN) 4 MG tablet Take 1 tablet (4 mg total) by mouth every 8 (eight) hours as needed for nausea or vomiting. 20 tablet 0   OZEMPIC, 0.25 OR 0.5 MG/DOSE, 2 MG/1.5ML SOPN Inject 0.25 mg into the skin every Tuesday.     rosuvastatin (CRESTOR) 40 MG tablet Take 40 mg by mouth at bedtime.     Semaglutide,0.25 or 0.'5MG'$ /DOS, (OZEMPIC, 0.25 OR 0.5 MG/DOSE,) 2 MG/3ML SOPN Inject 0.5 mg into the skin once a week. 3 mL 5   tamsulosin (FLOMAX) 0.4 MG CAPS capsule Take 1 capsule (0.4 mg total) by mouth daily after supper. 90 capsule 3   triamcinolone ointment (KENALOG) 0.5 % Apply 1 Application topically 4 (four) times daily as needed. 120 g 3   No facility-administered medications prior to visit.    ROS: Review of Systems  Constitutional:  Negative for appetite change, fatigue and unexpected weight change.  HENT:  Negative for congestion,  nosebleeds, sneezing, sore throat and trouble swallowing.   Eyes:  Negative for itching and visual disturbance.  Respiratory:  Negative for cough.   Cardiovascular:  Negative for chest pain, palpitations and leg swelling.  Gastrointestinal:  Negative for abdominal distention, blood in stool, diarrhea and nausea.  Genitourinary:  Negative for frequency and hematuria.  Musculoskeletal:  Positive for arthralgias, back pain and gait problem. Negative for joint swelling and neck pain.  Skin:  Negative for rash.  Neurological:  Negative for dizziness, tremors, speech difficulty and weakness.  Psychiatric/Behavioral:  Negative for agitation, dysphoric mood and sleep disturbance. The patient is not nervous/anxious.     Objective:  BP 120/64 (BP Location: Left Arm, Patient Position: Sitting,  Cuff Size: Normal)   Pulse 79   Temp 98.1 F (36.7 C) (Oral)   Ht '5\' 10"'$  (1.778 m)   Wt 245 lb (111.1 kg)   SpO2 99%   BMI 35.15 kg/m   BP Readings from Last 3 Encounters:  07/07/22 120/64  07/01/22 (!) 150/80  06/30/22 (!) 148/68    Wt Readings from Last 3 Encounters:  07/07/22 245 lb (111.1 kg)  07/01/22 246 lb 14.6 oz (112 kg)  06/30/22 247 lb (112 kg)    Physical Exam Constitutional:      General: He is not in acute distress.    Appearance: He is well-developed.     Comments: NAD  Eyes:     Conjunctiva/sclera: Conjunctivae normal.     Pupils: Pupils are equal, round, and reactive to light.  Neck:     Thyroid: No thyromegaly.     Vascular: No JVD.  Cardiovascular:     Rate and Rhythm: Normal rate and regular rhythm.     Heart sounds: Normal heart sounds. No murmur heard.    No friction rub. No gallop.  Pulmonary:     Effort: Pulmonary effort is normal. No respiratory distress.     Breath sounds: Normal breath sounds. No wheezing or rales.  Chest:     Chest wall: No tenderness.  Abdominal:     General: Bowel sounds are normal. There is no distension.     Palpations: Abdomen is soft. There is no mass.     Tenderness: There is no abdominal tenderness. There is no guarding or rebound.  Musculoskeletal:        General: Tenderness present. Normal range of motion.     Cervical back: Normal range of motion.     Right lower leg: No edema.     Left lower leg: No edema.  Lymphadenopathy:     Cervical: No cervical adenopathy.  Skin:    General: Skin is warm and dry.     Findings: No rash.  Neurological:     Mental Status: He is alert and oriented to person, place, and time.     Cranial Nerves: No cranial nerve deficit.     Motor: No abnormal muscle tone.     Coordination: Coordination normal.     Gait: Gait normal.     Deep Tendon Reflexes: Reflexes are normal and symmetric.  Psychiatric:        Behavior: Behavior normal.        Thought Content: Thought content  normal.        Judgment: Judgment normal.    Strait leg elev (+/-) on the R Knee DTR is decreased on the R R thigh - decr sensation    A total time of 45 minutes was spent preparing to see the patient,  reviewing tests, x-rays, operative reports and other medical records.  Also, obtaining history and performing comprehensive physical exam.  Additionally, counseling the patient regarding the above listed issues - sciatica, opioid and steroid use.   Finally, documenting clinical information in the health records, coordination of care, educating the patient. It is a complex case.   Lab Results  Component Value Date   WBC 6.6 02/25/2021   HGB 13.6 02/25/2021   HCT 41.9 02/25/2021   PLT 264 02/25/2021   GLUCOSE 143 (H) 02/25/2021   CHOL 165 08/19/2020   TRIG 111.0 08/19/2020   HDL 44.60 08/19/2020   LDLDIRECT 200.0 02/17/2016   LDLCALC 98 08/19/2020   ALT 14 08/19/2020   AST 14 08/19/2020   NA 139 02/25/2021   K 4.0 02/25/2021   CL 104 02/25/2021   CREATININE 1.54 (H) 02/25/2021   BUN 36 (H) 02/25/2021   CO2 25 02/25/2021   TSH 3.46 08/19/2020   PSA 0.73 02/04/2021   INR 1.08 02/02/2011   HGBA1C 7.2 (H) 02/25/2021   MICROALBUR 182.1 (H) 08/19/2020    DG Lumbar Spine Complete  Result Date: 07/01/2022 CLINICAL DATA:  Low back pain, initial encounter EXAM: LUMBAR SPINE - COMPLETE 4+ VIEW COMPARISON:  None Available. FINDINGS: Five lumbar type vertebral bodies are well visualized. Vertebral body height is well maintained. No pars defects are noted. No anterolisthesis is seen. Multi level osteophytic change and facet hypertrophic changes are noted. No soft tissue abnormality is seen. IMPRESSION: Degenerative change without acute abnormality. Electronically Signed   By: Inez Catalina M.D.   On: 07/01/2022 18:06   DG HIP UNILAT W OR W/O PELVIS 2-3 VIEWS RIGHT  Result Date: 07/01/2022 CLINICAL DATA:  Right hip pain, no known injury, initial encounter EXAM: DG HIP (WITH OR WITHOUT  PELVIS) 3V RIGHT COMPARISON:  None Available. FINDINGS: Pelvic ring is intact. No acute fracture or dislocation is noted. No soft tissue abnormality is seen. Degenerative changes of the hip joints are noted bilaterally. IMPRESSION: Mild degenerative change without acute abnormality. Electronically Signed   By: Inez Catalina M.D.   On: 07/01/2022 18:06   US Venous Img Lower Unilateral Right  Result Date: 07/01/2022 CLINICAL DATA:  Right thigh pain EXAM: RIGHT LOWER EXTREMITY VENOUS DOPPLER ULTRASOUND TECHNIQUE: Gray-scale sonography with compression, as well as color and duplex ultrasound, were performed to evaluate the deep venous system(s) from the level of the common femoral vein through the popliteal and proximal calf veins. COMPARISON:  None Available. FINDINGS: VENOUS Normal compressibility of the common femoral, superficial femoral, and popliteal veins, as well as the visualized calf veins. Visualized portions of profunda femoral vein and great saphenous vein unremarkable. No filling defects to suggest DVT on grayscale or color Doppler imaging. Doppler waveforms show normal direction of venous flow, normal respiratory plasticity and response to augmentation. Limited views of the contralateral common femoral vein are unremarkable. OTHER None. Limitations: none IMPRESSION: Negative. Electronically Signed   By: Jacqulynn Cadet M.D.   On: 07/01/2022 16:53    Assessment & Plan:   Problem List Items Addressed This Visit       Nervous and Auditory   Sciatica of right side - Primary    Severe pain  R leg pain x 1 week. Pain starts after supper - 9/10 in the R thigh and knee. The leg goes numb down to the foot - weak too "dead". Laying makes it worse. Mild LBP.  Vicodin, Methocarbamol - not much better   Rx: Methocarbamol, Percocet, medrol  pack. Diazepam prn w/caution MRI LS spine          No orders of the defined types were placed in this encounter.     Follow-up: No follow-ups on  file.  Walker Kehr, MD

## 2022-07-08 DIAGNOSIS — R609 Edema, unspecified: Secondary | ICD-10-CM | POA: Diagnosis not present

## 2022-07-08 DIAGNOSIS — E7800 Pure hypercholesterolemia, unspecified: Secondary | ICD-10-CM | POA: Diagnosis not present

## 2022-07-08 DIAGNOSIS — E1165 Type 2 diabetes mellitus with hyperglycemia: Secondary | ICD-10-CM | POA: Diagnosis not present

## 2022-07-08 DIAGNOSIS — I10 Essential (primary) hypertension: Secondary | ICD-10-CM | POA: Diagnosis not present

## 2022-07-15 ENCOUNTER — Encounter: Payer: Self-pay | Admitting: Internal Medicine

## 2022-07-15 NOTE — Telephone Encounter (Signed)
Pt need PA on Diazepam. Submitted w/ (Key: [id number]) PA has been sent to [facility].Marland Kitchenlmb

## 2022-07-16 ENCOUNTER — Telehealth: Payer: Self-pay | Admitting: Internal Medicine

## 2022-07-16 NOTE — Telephone Encounter (Signed)
Called [employer] back spoke w/ Tiffany. Needing to know Dx and how long pt been taking. Inform Tiffany we rec'd determination this am that the med was approved 07/15/22-07/15/23.Marland KitchenJohny Chess

## 2022-07-16 NOTE — Telephone Encounter (Signed)
[employer] called and asked for clarification on the dosage for diazepam (VALIUM) 5 MG tablet . They said it was rejected for the amount sent in, so they would like to discuss. Best callback number is [phone], option 5

## 2022-07-16 NOTE — Telephone Encounter (Signed)
Rec'd determination med was APPROVED. Effective 07/15/22 -07/15/2023. Will fax approval to pof.Marland KitchenJohny Chess

## 2022-07-18 NOTE — Telephone Encounter (Signed)
It was given as a muscle relaxer for his back pain.  Thanks.

## 2022-07-19 ENCOUNTER — Other Ambulatory Visit (HOSPITAL_COMMUNITY): Payer: Self-pay

## 2022-07-19 DIAGNOSIS — G4733 Obstructive sleep apnea (adult) (pediatric): Secondary | ICD-10-CM | POA: Diagnosis not present

## 2022-07-20 ENCOUNTER — Other Ambulatory Visit: Payer: Self-pay | Admitting: Internal Medicine

## 2022-07-20 MED ORDER — METHYLPREDNISOLONE 4 MG PO TBPK: ORAL_TABLET | ORAL | 0 refills | Status: DC

## 2022-07-27 ENCOUNTER — Ambulatory Visit (INDEPENDENT_AMBULATORY_CARE_PROVIDER_SITE_OTHER): Payer: Medicare Other | Admitting: Internal Medicine

## 2022-07-27 ENCOUNTER — Ambulatory Visit
Admission: RE | Admit: 2022-07-27 | Discharge: 2022-07-27 | Disposition: A | Discharge status: Home / Self Care | Payer: Medicare Other | Source: Ambulatory Visit | Attending: Internal Medicine | Admitting: Internal Medicine

## 2022-07-27 ENCOUNTER — Encounter: Payer: Self-pay | Admitting: Internal Medicine

## 2022-07-27 VITALS — BP 118/82 | HR 70 | Temp 97.9°F | Ht 70.0 in | Wt 241.0 lb

## 2022-07-27 DIAGNOSIS — M5431 Sciatica, right side: Secondary | ICD-10-CM | POA: Diagnosis not present

## 2022-07-27 DIAGNOSIS — M545 Low back pain, unspecified: Secondary | ICD-10-CM | POA: Diagnosis not present

## 2022-07-27 DIAGNOSIS — E1165 Type 2 diabetes mellitus with hyperglycemia: Secondary | ICD-10-CM

## 2022-07-27 DIAGNOSIS — Z794 Long term (current) use of insulin: Secondary | ICD-10-CM | POA: Diagnosis not present

## 2022-07-27 DIAGNOSIS — M48061 Spinal stenosis, lumbar region without neurogenic claudication: Secondary | ICD-10-CM | POA: Diagnosis not present

## 2022-07-27 MED ORDER — OXYCODONE-ACETAMINOPHEN 10-325 MG PO TABS: 1.0000 | ORAL_TABLET | Freq: Four times a day (QID) | ORAL | 0 refills | Status: DC | PRN

## 2022-07-27 NOTE — Assessment & Plan Note (Signed)
Better MRI - today Use less Diazepam Oxy - rare use prn

## 2022-07-27 NOTE — Assessment & Plan Note (Signed)
LBP, radiculitis - responded to Prednisone, Diazepam. LS MRI today Oxy po if pain returned

## 2022-07-27 NOTE — Assessment & Plan Note (Signed)
Probably worse on steroids

## 2022-07-27 NOTE — Progress Notes (Signed)
Subjective:  Patient ID: Talton Delpriore, male    DOB: 08/13/1950  Age: 72 y.o. MRN: [id number]  CC: Follow-up (2 week Follow Up)   HPI Kern Reap presents for LBP, radiculitis - responded to Prednisone, Diazepam. MRI today  Outpatient Medications Prior to Visit  Medication Sig Dispense Refill   amLODipine (NORVASC) 5 MG tablet TAKE 1 TABLET BY MOUTH DAILY 90 tablet 3   B-D ULTRAFINE III SHORT PEN 31G X 8 MM MISC USE AS DIRECTED 200 each 3   chlorpheniramine-HYDROcodone (TUSSIONEX) 10-8 MG/5ML Take 5 mLs by mouth every 12 (twelve) hours as needed for cough. 230 mL 0   Cholecalciferol (VITAMIN D) 50 MCG (2000 UT) tablet Take 2,000 Units by mouth daily.     clobetasol (TEMOVATE) 0.05 % external solution Apply 1 application. topically 2 (two) times daily. 50 mL 2   cloNIDine (CATAPRES) 0.1 MG tablet TAKE 1 TABLET BY MOUTH THREE TIMES DAILY AS NEEDED. TAKE IF SYSTOLIC BLOOD [id number] 90 tablet 2   Continuous Blood Gluc Sensor (FREESTYLE LIBRE 14 DAY SENSOR) MISC USE AS DIRECTED EVERY 14 DAYS     diazepam (VALIUM) 5 MG tablet Take 1-2 tablets (5-10 mg total) by mouth every 8 (eight) hours as needed for muscle spasms. 60 tablet 1   doxylamine, Sleep, (UNISOM) 25 MG tablet Take 25 mg by mouth at bedtime as needed for sleep.     furosemide (LASIX) 20 MG tablet Take 1 tablet (20 mg total) by mouth daily. 30 tablet 5   gabapentin (NEURONTIN) 600 MG tablet TAKE 1 TABLET(600 MG) BY MOUTH TWICE DAILY 180 tablet 3   glimepiride (AMARYL) 2 MG tablet Take 2 mg by mouth daily.     insulin glargine, 1 Unit Dial, (TOUJEO) 300 UNIT/ML Solostar Pen Inject 48 Units into the skin daily before breakfast.     JARDIANCE 10 MG TABS tablet TAKE 1 TABLET BY MOUTH DAILY 90 tablet 3   losartan (COZAAR) 100 MG tablet TAKE 1 TABLET BY MOUTH DAILY 90 tablet 3   losartan (COZAAR) 100 MG tablet TAKE 1 TABLET BY MOUTH DAILY 90 tablet 3   methocarbamol (ROBAXIN) 750 MG tablet TAKE ONE TABLET BY MOUTH THREE TIMES  DAILY AS NEEDED FOR MUSCLE SPASMS /pain 30 tablet 1   methylPREDNISolone (MEDROL DOSEPAK) 4 MG TBPK tablet As directed 21 tablet 0   metoprolol succinate (TOPROL-XL) 25 MG 24 hr tablet TAKE 1 TABLET(25 MG) BY MOUTH IN THE MORNING AND AT BEDTIME 180 tablet 3   ondansetron (ZOFRAN) 4 MG tablet Take 1 tablet (4 mg total) by mouth every 8 (eight) hours as needed for nausea or vomiting. 20 tablet 0   OZEMPIC, 0.25 OR 0.5 MG/DOSE, 2 MG/1.5ML SOPN Inject 0.25 mg into the skin every Tuesday.     rosuvastatin (CRESTOR) 40 MG tablet Take 40 mg by mouth at bedtime.     Semaglutide,0.25 or 0.5MG /DOS, (OZEMPIC, 0.25 OR 0.5 MG/DOSE,) 2 MG/3ML SOPN Inject 0.5 mg into the skin once a week. 3 mL 5   tamsulosin (FLOMAX) 0.4 MG CAPS capsule Take 1 capsule (0.4 mg total) by mouth daily after supper. 90 capsule 3   triamcinolone ointment (KENALOG) 0.5 % Apply 1 Application topically 4 (four) times daily as needed. 120 g 3   oxyCODONE-acetaminophen (PERCOCET) 10-325 MG tablet Take 1 tablet by mouth every 6 (six) hours as needed for pain. 20 tablet 0   No facility-administered medications prior to visit.    ROS: Review of Systems  Constitutional:  Negative for  appetite change, fatigue and unexpected weight change.  HENT:  Negative for congestion, nosebleeds, sneezing, sore throat and trouble swallowing.   Eyes:  Negative for itching and visual disturbance.  Respiratory:  Negative for cough.   Cardiovascular:  Negative for chest pain, palpitations and leg swelling.  Gastrointestinal:  Negative for abdominal distention, blood in stool, diarrhea and nausea.  Genitourinary:  Negative for frequency and hematuria.  Musculoskeletal:  Positive for back pain and gait problem. Negative for joint swelling and neck pain.  Skin:  Negative for rash.  Neurological:  Negative for dizziness, tremors, speech difficulty and weakness.  Psychiatric/Behavioral:  Negative for agitation, dysphoric mood and sleep disturbance. The patient  is not nervous/anxious.     Objective:  BP 118/82 (BP Location: Left Arm, Patient Position: Sitting, Cuff Size: Normal)   Pulse 70   Temp 97.9 F (36.6 C) (Oral)   Ht 5\' 10"  (1.778 m)   Wt 241 lb (109.3 kg)   SpO2 98%   BMI 34.58 kg/m   BP Readings from Last 3 Encounters:  07/27/22 118/82  07/07/22 120/64  07/01/22 (!) 150/80    Wt Readings from Last 3 Encounters:  07/27/22 241 lb (109.3 kg)  07/07/22 245 lb (111.1 kg)  07/01/22 246 lb 14.6 oz (112 kg)    Physical Exam Constitutional:      General: He is not in acute distress.    Appearance: He is well-developed. He is obese.     Comments: NAD  Eyes:     Conjunctiva/sclera: Conjunctivae normal.     Pupils: Pupils are equal, round, and reactive to light.  Neck:     Thyroid: No thyromegaly.     Vascular: No JVD.  Cardiovascular:     Rate and Rhythm: Normal rate and regular rhythm.     Heart sounds: Normal heart sounds. No murmur heard.    No friction rub. No gallop.  Pulmonary:     Effort: Pulmonary effort is normal. No respiratory distress.     Breath sounds: Normal breath sounds. No wheezing or rales.  Chest:     Chest wall: No tenderness.  Abdominal:     General: Bowel sounds are normal. There is no distension.     Palpations: Abdomen is soft. There is no mass.     Tenderness: There is no abdominal tenderness. There is no guarding or rebound.  Musculoskeletal:        General: No tenderness. Normal range of motion.     Cervical back: Normal range of motion.  Lymphadenopathy:     Cervical: No cervical adenopathy.  Skin:    General: Skin is warm and dry.     Findings: No rash.  Neurological:     Mental Status: He is alert and oriented to person, place, and time.     Cranial Nerves: No cranial nerve deficit.     Motor: No abnormal muscle tone.     Coordination: Coordination normal.     Gait: Gait normal.     Deep Tendon Reflexes: Reflexes are normal and symmetric.  Psychiatric:        Behavior: Behavior  normal.        Thought Content: Thought content normal.        Judgment: Judgment normal.   Limp Str leg elev is (-)  Lab Results  Component Value Date   WBC 6.6 02/25/2021   HGB 13.6 02/25/2021   HCT 41.9 02/25/2021   PLT 264 02/25/2021   GLUCOSE 143 (H) 02/25/2021  CHOL 165 08/19/2020   TRIG 111.0 08/19/2020   HDL 44.60 08/19/2020   LDLDIRECT 200.0 02/17/2016   LDLCALC 98 08/19/2020   ALT 14 08/19/2020   AST 14 08/19/2020   NA 139 02/25/2021   K 4.0 02/25/2021   CL 104 02/25/2021   CREATININE 1.54 (H) 02/25/2021   BUN 36 (H) 02/25/2021   CO2 25 02/25/2021   TSH 3.46 08/19/2020   PSA 0.73 02/04/2021   INR 1.08 02/02/2011   HGBA1C 7.2 (H) 02/25/2021   MICROALBUR 182.1 (H) 08/19/2020    DG Lumbar Spine Complete  Result Date: 07/01/2022 CLINICAL DATA:  Low back pain, initial encounter EXAM: LUMBAR SPINE - COMPLETE 4+ VIEW COMPARISON:  None Available. FINDINGS: Five lumbar type vertebral bodies are well visualized. Vertebral body height is well maintained. No pars defects are noted. No anterolisthesis is seen. Multi level osteophytic change and facet hypertrophic changes are noted. No soft tissue abnormality is seen. IMPRESSION: Degenerative change without acute abnormality. Electronically Signed   By: Inez Catalina M.D.   On: 07/01/2022 18:06   DG HIP UNILAT W OR W/O PELVIS 2-3 VIEWS RIGHT  Result Date: 07/01/2022 CLINICAL DATA:  Right hip pain, no known injury, initial encounter EXAM: DG HIP (WITH OR WITHOUT PELVIS) 3V RIGHT COMPARISON:  None Available. FINDINGS: Pelvic ring is intact. No acute fracture or dislocation is noted. No soft tissue abnormality is seen. Degenerative changes of the hip joints are noted bilaterally. IMPRESSION: Mild degenerative change without acute abnormality. Electronically Signed   By: Inez Catalina M.D.   On: 07/01/2022 18:06   US Venous Img Lower Unilateral Right  Result Date: 07/01/2022 CLINICAL DATA:  Right thigh pain EXAM: RIGHT LOWER  EXTREMITY VENOUS DOPPLER ULTRASOUND TECHNIQUE: Gray-scale sonography with compression, as well as color and duplex ultrasound, were performed to evaluate the deep venous system(s) from the level of the common femoral vein through the popliteal and proximal calf veins. COMPARISON:  None Available. FINDINGS: VENOUS Normal compressibility of the common femoral, superficial femoral, and popliteal veins, as well as the visualized calf veins. Visualized portions of profunda femoral vein and great saphenous vein unremarkable. No filling defects to suggest DVT on grayscale or color Doppler imaging. Doppler waveforms show normal direction of venous flow, normal respiratory plasticity and response to augmentation. Limited views of the contralateral common femoral vein are unremarkable. OTHER None. Limitations: none IMPRESSION: Negative. Electronically Signed   By: Jacqulynn Cadet M.D.   On: 07/01/2022 16:53    Assessment & Plan:   Problem List Items Addressed This Visit       Endocrine   Type 2 diabetes mellitus with hyperglycemia ([facility]) - Primary    Probably worse on steroids        Nervous and Auditory   Sciatica of right side    Better MRI - today Use less Diazepam Oxy - rare use prn        Other   Obesity, morbid (H[facility])    Wt Readings from Last 3 Encounters:  07/27/22 241 lb (109.3 kg)  07/07/22 245 lb (111.1 kg)  07/01/22 246 lb 14.6 oz (112 kg)           Meds ordered this encounter  Medications   oxyCODONE-acetaminophen (PERCOCET) 10-325 MG tablet    Sig: Take 1 tablet by mouth every 6 (six) hours as needed for pain.    Dispense:  20 tablet    Refill:  0      Follow-up: Return in about 6 weeks (around  09/07/2022) for a follow-up visit.  Walker Kehr, MD

## 2022-07-27 NOTE — Assessment & Plan Note (Signed)
Wt Readings from Last 3 Encounters:  07/27/22 241 lb (109.3 kg)  07/07/22 245 lb (111.1 kg)  07/01/22 246 lb 14.6 oz (112 kg)

## 2022-07-30 DIAGNOSIS — G4733 Obstructive sleep apnea (adult) (pediatric): Secondary | ICD-10-CM | POA: Diagnosis not present

## 2022-08-02 NOTE — Addendum Note (Signed)
Addended by: Cassandria Anger on: 08/02/2022 07:55 AM   Modules accepted: Orders

## 2022-08-03 ENCOUNTER — Other Ambulatory Visit (HOSPITAL_COMMUNITY): Payer: Self-pay

## 2022-08-05 ENCOUNTER — Other Ambulatory Visit: Payer: Self-pay | Admitting: Internal Medicine

## 2022-08-09 ENCOUNTER — Encounter: Payer: Self-pay | Admitting: Internal Medicine

## 2022-08-12 ENCOUNTER — Other Ambulatory Visit: Payer: Self-pay | Admitting: Internal Medicine

## 2022-08-12 MED ORDER — METHYLPREDNISOLONE 4 MG PO TBPK: ORAL_TABLET | ORAL | 0 refills | Status: DC

## 2022-09-06 DIAGNOSIS — L578 Other skin changes due to chronic exposure to nonionizing radiation: Secondary | ICD-10-CM | POA: Diagnosis not present

## 2022-09-06 DIAGNOSIS — L821 Other seborrheic keratosis: Secondary | ICD-10-CM | POA: Diagnosis not present

## 2022-09-06 DIAGNOSIS — D485 Neoplasm of uncertain behavior of skin: Secondary | ICD-10-CM | POA: Diagnosis not present

## 2022-09-06 DIAGNOSIS — L570 Actinic keratosis: Secondary | ICD-10-CM | POA: Diagnosis not present

## 2022-09-07 ENCOUNTER — Ambulatory Visit (HOSPITAL_BASED_OUTPATIENT_CLINIC_OR_DEPARTMENT_OTHER): Payer: Medicare Other | Admitting: Pulmonary Disease

## 2022-09-07 ENCOUNTER — Encounter (HOSPITAL_BASED_OUTPATIENT_CLINIC_OR_DEPARTMENT_OTHER): Payer: Self-pay | Admitting: Pulmonary Disease

## 2022-09-07 ENCOUNTER — Ambulatory Visit: Payer: Medicare Other | Admitting: Internal Medicine

## 2022-09-07 VITALS — BP 148/70 | HR 73 | Ht 70.0 in | Wt 249.2 lb

## 2022-09-07 DIAGNOSIS — G4733 Obstructive sleep apnea (adult) (pediatric): Secondary | ICD-10-CM

## 2022-09-07 DIAGNOSIS — M5416 Radiculopathy, lumbar region: Secondary | ICD-10-CM | POA: Diagnosis not present

## 2022-09-07 DIAGNOSIS — Z6837 Body mass index (BMI) 37.0-37.9, adult: Secondary | ICD-10-CM | POA: Diagnosis not present

## 2022-09-07 DIAGNOSIS — M5126 Other intervertebral disc displacement, lumbar region: Secondary | ICD-10-CM | POA: Diagnosis not present

## 2022-09-07 NOTE — Assessment & Plan Note (Signed)
CPAP download was reviewed which shows residual AHI of 6.7/hour, on certain nights as high as 10-15 events per hour. Average pressure is 12 with max pressure 12.5 cm on auto settings 5 to 13 cm. He seems to be having breakthrough snoring and we need to increase his pressure, we will change him to auto settings 8 to 15 cm and hopefully this should take care of the snoring and decrease residual events.  We may have to up with another download Weight loss encouraged, compliance with goal of at least 4-6 hrs every night is the expectation. Advised against medications with sedative side effects Cautioned against driving when sleepy - understanding that sleepiness will vary on a day to day basis

## 2022-09-07 NOTE — Patient Instructions (Signed)
X Increase auto CPAP to 8-15 cm - Christoper Allegra

## 2022-09-07 NOTE — Progress Notes (Signed)
   Subjective:    Patient ID: [name] [name], male    DOB: [birth year], 72 y.o.   MRN: [id number]  HPI  73  yo diabetic, hypertensive for FU of obstructive sleep apnea. He is brother-in-law to Devra Dopp, our nurse practitioner.  he was initially diagnosed around 1995  24-month follow-up visit. He received a new AutoSet 11 between 5 and 13 cm.  His previous machine was set between 10 and 18 cm with an average pressure of 15 and maximum pressure 17 cm. He does not like the AutoSet 11 March compared to his previous machine because the humidifier seems to run out and he has severe dryness of mouth.  He is to replace water in the humidifier at night and this disturbs his sleep.  He does report some nocturia.  He has been trying the F30 mask His wife has noted snoring on occasion He also complains of sciatica and has upcoming appointment with neurosurgery  Echocardiogram reviewed which shows normal RV function and size  Significant tests/ events reviewed  CT chest without contrast 01/14/2022 minimal emphysema   1995 NPSG (wt=235#) w/ 53 events/hr & desat to 83%- started on CPAP... 2011  (wt=290#) >>new CPAP machine, last autotitrate showed optimal pressure= 13cmH2O.   Review of Systems neg for any significant sore throat, dysphagia, itching, sneezing, nasal congestion or excess/ purulent secretions, fever, chills, sweats, unintended wt loss, pleuritic or exertional cp, hempoptysis, orthopnea pnd or change in chronic leg swelling. Also denies presyncope, palpitations, heartburn, abdominal pain, nausea, vomiting, diarrhea or change in bowel or urinary habits, dysuria,hematuria, rash, arthralgias, visual complaints, headache, numbness weakness or ataxia.     Objective:   Physical Exam  Gen. Pleasant, obese, in no distress ENT - no lesions, no post nasal drip Neck: No JVD, no thyromegaly, no carotid bruits Lungs: no use of accessory muscles, no dullness to percussion, decreased without rales  or rhonchi  Cardiovascular: Rhythm regular, heart sounds  normal, no murmurs or gallops, no peripheral edema Musculoskeletal: No deformities, no cyanosis or clubbing , no tremors       Assessment & Plan:

## 2022-09-23 ENCOUNTER — Other Ambulatory Visit: Payer: Self-pay | Admitting: Internal Medicine

## 2022-09-23 DIAGNOSIS — I10 Essential (primary) hypertension: Secondary | ICD-10-CM

## 2022-09-30 ENCOUNTER — Telehealth: Payer: Self-pay

## 2022-09-30 NOTE — Telephone Encounter (Signed)
Pt has called and stated he has spoken to you about adding his son as a new pt. I seen that his son was a no show for the 08/11/2022 apptmnt. However, pt son was never informed of this apptmnt due to wrong number on file when office was reaching out. I was able to update son's number. Pt want's to know will you still take son as new pt?

## 2022-09-30 NOTE — Telephone Encounter (Signed)
Yes ok to reschedule appt. Inform dad if pt N/S appt this time he can not be reschedule../l,mb

## 2022-10-19 DIAGNOSIS — M5116 Intervertebral disc disorders with radiculopathy, lumbar region: Secondary | ICD-10-CM | POA: Diagnosis not present

## 2022-10-30 DIAGNOSIS — G4733 Obstructive sleep apnea (adult) (pediatric): Secondary | ICD-10-CM | POA: Diagnosis not present

## 2022-11-03 ENCOUNTER — Other Ambulatory Visit (HOSPITAL_COMMUNITY): Payer: Self-pay

## 2022-11-08 DIAGNOSIS — N401 Enlarged prostate with lower urinary tract symptoms: Secondary | ICD-10-CM | POA: Diagnosis not present

## 2022-11-08 DIAGNOSIS — E7800 Pure hypercholesterolemia, unspecified: Secondary | ICD-10-CM | POA: Diagnosis not present

## 2022-11-08 DIAGNOSIS — R609 Edema, unspecified: Secondary | ICD-10-CM | POA: Diagnosis not present

## 2022-11-08 DIAGNOSIS — E1165 Type 2 diabetes mellitus with hyperglycemia: Secondary | ICD-10-CM | POA: Diagnosis not present

## 2022-11-15 DIAGNOSIS — I10 Essential (primary) hypertension: Secondary | ICD-10-CM | POA: Diagnosis not present

## 2022-11-15 DIAGNOSIS — R609 Edema, unspecified: Secondary | ICD-10-CM | POA: Diagnosis not present

## 2022-11-15 DIAGNOSIS — E1165 Type 2 diabetes mellitus with hyperglycemia: Secondary | ICD-10-CM | POA: Diagnosis not present

## 2022-11-15 DIAGNOSIS — E7800 Pure hypercholesterolemia, unspecified: Secondary | ICD-10-CM | POA: Diagnosis not present

## 2022-12-06 DIAGNOSIS — L570 Actinic keratosis: Secondary | ICD-10-CM | POA: Diagnosis not present

## 2022-12-06 DIAGNOSIS — L821 Other seborrheic keratosis: Secondary | ICD-10-CM | POA: Diagnosis not present

## 2022-12-08 ENCOUNTER — Encounter (INDEPENDENT_AMBULATORY_CARE_PROVIDER_SITE_OTHER): Payer: Self-pay

## 2022-12-29 ENCOUNTER — Encounter: Payer: Self-pay | Admitting: Internal Medicine

## 2022-12-29 ENCOUNTER — Ambulatory Visit (INDEPENDENT_AMBULATORY_CARE_PROVIDER_SITE_OTHER): Payer: Medicare Other | Admitting: Internal Medicine

## 2022-12-29 VITALS — BP 110/62 | HR 54 | Temp 98.3°F | Ht 70.0 in | Wt 258.0 lb

## 2022-12-29 DIAGNOSIS — I10 Essential (primary) hypertension: Secondary | ICD-10-CM

## 2022-12-29 DIAGNOSIS — M5126 Other intervertebral disc displacement, lumbar region: Secondary | ICD-10-CM | POA: Insufficient documentation

## 2022-12-29 DIAGNOSIS — E1165 Type 2 diabetes mellitus with hyperglycemia: Secondary | ICD-10-CM | POA: Diagnosis not present

## 2022-12-29 DIAGNOSIS — Z794 Long term (current) use of insulin: Secondary | ICD-10-CM | POA: Diagnosis not present

## 2022-12-29 DIAGNOSIS — F439 Reaction to severe stress, unspecified: Secondary | ICD-10-CM

## 2022-12-29 DIAGNOSIS — M5431 Sciatica, right side: Secondary | ICD-10-CM

## 2022-12-29 DIAGNOSIS — E785 Hyperlipidemia, unspecified: Secondary | ICD-10-CM

## 2022-12-29 MED ORDER — DAPAGLIFLOZIN PROPANEDIOL 10 MG PO TABS: 10.0000 mg | ORAL_TABLET | Freq: Every day | ORAL | 3 refills | Status: DC

## 2022-12-29 MED ORDER — OZEMPIC (1 MG/DOSE) 2 MG/1.5ML ~~LOC~~ SOPN: 0.2500 mg | PEN_INJECTOR | SUBCUTANEOUS | 3 refills | Status: DC

## 2022-12-29 NOTE — Assessment & Plan Note (Signed)
F/u w/Dr Balan 

## 2022-12-29 NOTE — Assessment & Plan Note (Signed)
Discussed.

## 2022-12-29 NOTE — Assessment & Plan Note (Addendum)
Wt Readings from Last 3 Encounters:  12/29/22 258 lb (117 kg)  09/07/22 249 lb 3.2 oz (113 kg)  07/27/22 241 lb (109.3 kg)   Worse Re-start Ozempic

## 2022-12-29 NOTE — Assessment & Plan Note (Signed)
Toprol XL, ASA, Amlodipine, Losartan

## 2022-12-29 NOTE — Assessment & Plan Note (Signed)
Resolved after surgery

## 2022-12-29 NOTE — Assessment & Plan Note (Signed)
On Crestor Hold Crestor if cramps Use Magnesium oil spray for muscle cramps

## 2022-12-29 NOTE — Assessment & Plan Note (Addendum)
LBP, radiculitis - responded to Prednisone, Diazepam. LS MRI today Oxy po if pain returned Jardiance is too $$$ - Rx for [place] Re-start Ozempic

## 2022-12-29 NOTE — Progress Notes (Signed)
Subjective:  Patient ID: [name] [name], male    DOB: 1950/06/12  Age: 72 y.o. MRN: [id number]  CC: Follow-up (6 mnth)   HPI Henok Delozier presents for HTN, DM, cough, OSA  Outpatient Medications Prior to Visit  Medication Sig Dispense Refill   amLODipine (NORVASC) 5 MG tablet TAKE 1 TABLET BY MOUTH DAILY 90 tablet 3   B-D ULTRAFINE III SHORT PEN 31G X 8 MM MISC USE AS DIRECTED 200 each 3   chlorpheniramine-HYDROcodone (TUSSIONEX) 10-8 MG/5ML Take 5 mLs by mouth every 12 (twelve) hours as needed for cough. 230 mL 0   Cholecalciferol (VITAMIN D) 50 MCG (2000 UT) tablet Take 2,000 Units by mouth daily.     clobetasol (TEMOVATE) 0.05 % external solution Apply 1 application. topically 2 (two) times daily. 50 mL 2   cloNIDine (CATAPRES) 0.1 MG tablet TAKE 1 TABLET BY MOUTH THREE TIMES DAILY AS NEEDED. TAKE IF SYSTOLIC BLOOD PRESSURE>170 90 tablet 2   Continuous Blood Gluc Sensor (FREESTYLE LIBRE 14 DAY SENSOR) MISC USE AS DIRECTED EVERY 14 DAYS     diazepam (VALIUM) 5 MG tablet Take 1-2 tablets (5-10 mg total) by mouth every 8 (eight) hours as needed for muscle spasms. 60 tablet 1   doxylamine, Sleep, (UNISOM) 25 MG tablet Take 25 mg by mouth at bedtime as needed for sleep.     furosemide (LASIX) 20 MG tablet Take 1 tablet (20 mg total) by mouth daily. 30 tablet 5   gabapentin (NEURONTIN) 600 MG tablet TAKE 1 TABLET(600 MG) BY MOUTH TWICE DAILY 180 tablet 3   glimepiride (AMARYL) 2 MG tablet Take 2 mg by mouth daily.     insulin glargine, 1 Unit Dial, (TOUJEO) 300 UNIT/ML Solostar Pen Inject 48 Units into the skin daily before breakfast.     JARDIANCE 10 MG TABS tablet TAKE 1 TABLET BY MOUTH DAILY 90 tablet 3   losartan (COZAAR) 100 MG tablet TAKE 1 TABLET BY MOUTH DAILY 90 tablet 3   losartan (COZAAR) 100 MG tablet TAKE 1 TABLET BY MOUTH DAILY 90 tablet 3   methocarbamol (ROBAXIN) 750 MG tablet TAKE ONE TABLET BY MOUTH THREE TIMES DAILY AS NEEDED FOR MUSCLE SPASMS /pain 30 tablet 1    methylPREDNISolone (MEDROL DOSEPAK) 4 MG TBPK tablet As directed 21 tablet 0   metoprolol succinate (TOPROL-XL) 25 MG 24 hr tablet TAKE 1 TABLET BY MOUTH IN THE MORNING AND AT BEDTIME 180 tablet 3   ondansetron (ZOFRAN) 4 MG tablet Take 1 tablet (4 mg total) by mouth every 8 (eight) hours as needed for nausea or vomiting. 20 tablet 0   oxyCODONE-acetaminophen (PERCOCET) 10-325 MG tablet Take 1 tablet by mouth every 6 (six) hours as needed for pain. 20 tablet 0   OZEMPIC, 0.25 OR 0.5 MG/DOSE, 2 MG/1.5ML SOPN Inject 0.25 mg into the skin every Tuesday.     rosuvastatin (CRESTOR) 40 MG tablet Take 40 mg by mouth at bedtime.     Semaglutide,0.25 or 0.5MG /DOS, (OZEMPIC, 0.25 OR 0.5 MG/DOSE,) 2 MG/3ML SOPN Inject 0.5 mg into the skin once a week. 3 mL 5   tamsulosin (FLOMAX) 0.4 MG CAPS capsule Take 1 capsule (0.4 mg total) by mouth daily after supper. 90 capsule 3   triamcinolone ointment (KENALOG) 0.5 % Apply 1 Application topically 4 (four) times daily as needed. 120 g 3   No facility-administered medications prior to visit.    ROS: Review of Systems  Constitutional:  Positive for unexpected weight change. Negative for appetite change and  fatigue.  HENT:  Negative for congestion, nosebleeds, sneezing, sore throat and trouble swallowing.   Eyes:  Negative for itching and visual disturbance.  Respiratory:  Positive for cough.   Cardiovascular:  Negative for chest pain, palpitations and leg swelling.  Gastrointestinal:  Negative for abdominal distention, blood in stool, diarrhea and nausea.  Genitourinary:  Negative for frequency and hematuria.  Musculoskeletal:  Positive for back pain and gait problem. Negative for joint swelling and neck pain.  Skin:  Negative for rash.  Neurological:  Negative for dizziness, tremors, speech difficulty and weakness.  Psychiatric/Behavioral:  Negative for agitation, dysphoric mood and sleep disturbance. The patient is not nervous/anxious.     Objective:  BP  110/62 (BP Location: Left Arm, Patient Position: Sitting, Cuff Size: Large)   Pulse (!) 54   Temp 98.3 F (36.8 C) (Oral)   Ht 5\' 10"  (1.778 m)   Wt 258 lb (117 kg)   SpO2 96%   BMI 37.02 kg/m   BP Readings from Last 3 Encounters:  12/29/22 110/62  09/07/22 (!) 148/70  07/27/22 118/82    Wt Readings from Last 3 Encounters:  12/29/22 258 lb (117 kg)  09/07/22 249 lb 3.2 oz (113 kg)  07/27/22 241 lb (109.3 kg)    Physical Exam Constitutional:      General: He is not in acute distress.    Appearance: He is well-developed. He is obese.     Comments: NAD  Eyes:     Conjunctiva/sclera: Conjunctivae normal.     Pupils: Pupils are equal, round, and reactive to light.  Neck:     Thyroid: No thyromegaly.     Vascular: No JVD.  Cardiovascular:     Rate and Rhythm: Normal rate and regular rhythm.     Heart sounds: Normal heart sounds. No murmur heard.    No friction rub. No gallop.  Pulmonary:     Effort: Pulmonary effort is normal. No respiratory distress.     Breath sounds: Normal breath sounds. No wheezing or rales.  Chest:     Chest wall: No tenderness.  Abdominal:     General: Bowel sounds are normal. There is no distension.     Palpations: Abdomen is soft. There is no mass.     Tenderness: There is no abdominal tenderness. There is no guarding or rebound.  Musculoskeletal:        General: No tenderness. Normal range of motion.     Cervical back: Normal range of motion.  Lymphadenopathy:     Cervical: No cervical adenopathy.  Skin:    General: Skin is warm and dry.     Findings: No rash.  Neurological:     Mental Status: He is alert and oriented to person, place, and time.     Cranial Nerves: No cranial nerve deficit.     Motor: No abnormal muscle tone.     Coordination: Coordination normal.     Gait: Gait normal.     Deep Tendon Reflexes: Reflexes are normal and symmetric.  Psychiatric:        Behavior: Behavior normal.        Thought Content: Thought content  normal.        Judgment: Judgment normal.     Lab Results  Component Value Date   WBC 6.6 02/25/2021   HGB 13.6 02/25/2021   HCT 41.9 02/25/2021   PLT 264 02/25/2021   GLUCOSE 143 (H) 02/25/2021   CHOL 165 08/19/2020   TRIG 111.0 08/19/2020  HDL 44.60 08/19/2020   LDLDIRECT 200.0 02/17/2016   LDLCALC 98 08/19/2020   ALT 14 08/19/2020   AST 14 08/19/2020   NA 139 02/25/2021   K 4.0 02/25/2021   CL 104 02/25/2021   CREATININE 1.54 (H) 02/25/2021   BUN 36 (H) 02/25/2021   CO2 25 02/25/2021   TSH 3.46 08/19/2020   PSA 0.73 02/04/2021   INR 1.08 02/02/2011   HGBA1C 7.2 (H) 02/25/2021   MICROALBUR 182.1 (H) 08/19/2020    MR Lumbar Spine Wo Contrast  Result Date: 07/31/2022 CLINICAL DATA:  Low back pain radiating to the right buttock and right leg for 3 weeks EXAM: MRI LUMBAR SPINE WITHOUT CONTRAST TECHNIQUE: Multiplanar, multisequence MR imaging of the lumbar spine was performed. No intravenous contrast was administered. COMPARISON:  None Available. FINDINGS: Segmentation:  Standard. Alignment:  Physiologic. Vertebrae: No acute fracture, evidence of discitis, or aggressive bone lesion. Conus medullaris and cauda equina: Conus extends to the L1 level. Conus and cauda equina appear normal. Paraspinal and other soft tissues: No acute paraspinal abnormality. Disc levels: Disc spaces: Degenerative disease with disc height loss at L2-3. Disc desiccation at L3-4 and L4-5. T12-L1: No significant disc bulge. No neural foraminal stenosis. No central canal stenosis. L1-L2: No significant disc bulge. Mild bilateral facet arthropathy. No foraminal or central canal stenosis. L2-L3: Mild broad-based disc bulge. Mild bilateral facet arthropathy. Moderate right foraminal stenosis. No left foraminal stenosis. No spinal stenosis. L3-L4: Broad-based disc bulge with a broad right paracentral disc protrusion with mass effect of the right intraspinal L4 nerve root. Moderate bilateral facet arthropathy with  ligamentum flavum infolding. Moderate spinal stenosis. Mild left foraminal stenosis. Moderate right foraminal stenosis. L4-L5: Broad-based disc bulge. Moderate bilateral facet arthropathy. Mild spinal stenosis. Mild bilateral foraminal stenosis. L5-S1: No disc protrusion, foraminal stenosis or central canal stenosis. Moderate bilateral facet arthropathy. IMPRESSION: 1. At L3-4 there is a broad-based disc bulge with a broad right paracentral disc protrusion with mass effect of the right intraspinal L4 nerve root. Moderate bilateral facet arthropathy. Moderate spinal stenosis. Mild left foraminal stenosis. Moderate right foraminal stenosis. 2. At L2-3 there is a mild broad-based disc bulge. Mild bilateral facet arthropathy. Moderate right foraminal stenosis. 3. At L4-5 there is a broad-based disc bulge. Moderate bilateral facet arthropathy. Mild spinal stenosis. Mild bilateral foraminal stenosis. 4. No acute osseous injury of the lumbar spine. Electronically Signed   By: Elige Ko M.D.   On: 07/31/2022 06:32    Assessment & Plan:   Problem List Items Addressed This Visit     DM2 (diabetes mellitus, type 2) (HCC) - Primary     LBP, radiculitis - responded to Prednisone, Diazepam. LS MRI today Oxy po if pain returned Jardiance is too $$$ - Rx for Farxiga Re-start Ozempic      Relevant Medications   dapagliflozin propanediol (FARXIGA) 10 MG TABS tablet   Semaglutide, 1 MG/DOSE, (OZEMPIC, 1 MG/DOSE,) 2 MG/1.5ML SOPN   Dyslipidemia    On Crestor Hold Crestor if cramps Use Magnesium oil spray for muscle cramps        Obesity, morbid (HCC)    Wt Readings from Last 3 Encounters:  12/29/22 258 lb (117 kg)  09/07/22 249 lb 3.2 oz (113 kg)  07/27/22 241 lb (109.3 kg)   Worse Re-start Ozempic      Relevant Medications   dapagliflozin propanediol (FARXIGA) 10 MG TABS tablet   Semaglutide, 1 MG/DOSE, (OZEMPIC, 1 MG/DOSE,) 2 MG/1.5ML SOPN   Essential hypertension    Toprol  XL, ASA,  Amlodipine, Losartan      Sciatica of right side    Resolved after surgery       Stress at home    Discussed         Meds ordered this encounter  Medications   dapagliflozin propanediol (FARXIGA) 10 MG TABS tablet    Sig: Take 1 tablet (10 mg total) by mouth daily before breakfast.    Dispense:  90 tablet    Refill:  3   Semaglutide, 1 MG/DOSE, (OZEMPIC, 1 MG/DOSE,) 2 MG/1.5ML SOPN    Sig: Inject 0.25 mg into the skin once a week. Use 1 mg sq weekly. Can titrate up to 2 mg weekly if tolerated    Dispense:  1.5 mL    Refill:  3      Follow-up: Return in about 3 months (around 03/31/2023) for a follow-up visit.  Sonda Primes, MD

## 2023-01-02 DIAGNOSIS — G4733 Obstructive sleep apnea (adult) (pediatric): Secondary | ICD-10-CM | POA: Diagnosis not present

## 2023-01-31 DIAGNOSIS — G4733 Obstructive sleep apnea (adult) (pediatric): Secondary | ICD-10-CM | POA: Diagnosis not present

## 2023-02-01 ENCOUNTER — Other Ambulatory Visit (HOSPITAL_COMMUNITY): Payer: Self-pay

## 2023-02-01 MED ORDER — FREESTYLE LIBRE 2 SENSOR MISC
1.0000 | 11 refills | Status: DC
  Filled 2023-02-01: qty 2, 28d supply, fill #0

## 2023-02-17 ENCOUNTER — Telehealth: Payer: Self-pay | Admitting: Internal Medicine

## 2023-02-17 NOTE — Telephone Encounter (Signed)
[facility] called to verify that it is okay to fill amLODipine (NORVASC) 5 MG tablet. They would like a call back at [phone].

## 2023-02-18 MED ORDER — AMLODIPINE BESYLATE 5 MG PO TABS: 5.0000 mg | ORAL_TABLET | Freq: Every day | ORAL | 3 refills | Status: DC

## 2023-02-18 NOTE — Telephone Encounter (Signed)
Spoke with [facility] and they have stated they are refilling the medication as they received the rx for this.

## 2023-02-18 NOTE — Telephone Encounter (Signed)
Okay. Thank you.

## 2023-02-24 DIAGNOSIS — G4733 Obstructive sleep apnea (adult) (pediatric): Secondary | ICD-10-CM | POA: Diagnosis not present

## 2023-03-01 ENCOUNTER — Other Ambulatory Visit: Payer: Self-pay | Admitting: Internal Medicine

## 2023-03-11 ENCOUNTER — Ambulatory Visit (INDEPENDENT_AMBULATORY_CARE_PROVIDER_SITE_OTHER): Payer: Medicare Other

## 2023-03-11 DIAGNOSIS — Z0000 Encounter for general adult medical examination without abnormal findings: Secondary | ICD-10-CM | POA: Diagnosis not present

## 2023-03-11 NOTE — Progress Notes (Addendum)
Subjective:   [name] [name] is a 72 y.o. male who presents for Medicare Annual/Subsequent preventive examination.  Visit Complete: Virtual I connected with  [name] [name] on 03/11/23 by a audio enabled telemedicine application and verified that I am speaking with the correct person using two identifiers.  Patient Location: Home  Provider Location: Office/Clinic  I discussed the limitations of evaluation and management by telemedicine. The patient expressed understanding and agreed to proceed.  Vital Signs: Because this visit was a virtual/telehealth visit, some criteria may be missing or patient reported. Any vitals not documented were not able to be obtained and vitals that have been documented are patient reported.  Patient Medicare AWV questionnaire was completed by the patient on 03/09/2023; I have confirmed that all information answered by patient is correct and no changes since this date.  Cardiac Risk Factors include: advanced age (>25men, >41 women);diabetes mellitus;hypertension;male gender     Objective:    Today's Vitals   There is no height or weight on file to calculate BMI.     03/11/2023    2:36 PM 07/01/2022    4:08 PM 12/24/2021    9:48 AM 02/25/2021    9:02 AM 02/19/2019    3:41 PM 12/29/2018    1:12 PM 02/12/2016    7:09 AM  Advanced Directives  Does Patient Have a Medical Advance Directive? Yes No Yes Yes No No No  Type of Estate agent of [place];Living will  Living will;Healthcare Power of Attorney Living will     Does patient want to make changes to medical advance directive?   No - Patient declined      Copy of Healthcare Power of Attorney in Chart? No - copy requested  No - copy requested      Would patient like information on creating a medical advance directive?       No - patient declined information    Current Medications (verified) Outpatient Encounter Medications as of 03/11/2023  Medication Sig   amLODipine (NORVASC)  5 MG tablet Take 1 tablet (5 mg total) by mouth daily.   B-D ULTRAFINE III SHORT PEN 31G X 8 MM MISC USE AS DIRECTED   chlorpheniramine-HYDROcodone (TUSSIONEX) 10-8 MG/5ML Take 5 mLs by mouth every 12 (twelve) hours as needed for cough.   Cholecalciferol (VITAMIN D) 50 MCG (2000 UT) tablet Take 2,000 Units by mouth daily.   clobetasol (TEMOVATE) 0.05 % external solution Apply 1 application. topically 2 (two) times daily.   cloNIDine (CATAPRES) 0.1 MG tablet TAKE 1 TABLET BY MOUTH THREE TIMES DAILY AS NEEDED. TAKE IF SYSTOLIC BLOOD PRESSURE>170   doxylamine, Sleep, (UNISOM) 25 MG tablet Take 25 mg by mouth at bedtime as needed for sleep.   gabapentin (NEURONTIN) 600 MG tablet TAKE 1 TABLET BY MOUTH TWICE DAILY   glimepiride (AMARYL) 2 MG tablet Take 2 mg by mouth daily.   insulin glargine, 1 Unit Dial, (TOUJEO) 300 UNIT/ML Solostar Pen Inject 48 Units into the skin daily before breakfast.   JARDIANCE 10 MG TABS tablet TAKE 1 TABLET BY MOUTH DAILY   losartan (COZAAR) 100 MG tablet TAKE 1 TABLET BY MOUTH DAILY   metoprolol succinate (TOPROL-XL) 25 MG 24 hr tablet TAKE 1 TABLET BY MOUTH IN THE MORNING AND AT BEDTIME   OZEMPIC, 0.25 OR 0.5 MG/DOSE, 2 MG/1.5ML SOPN Inject 0.25 mg into the skin every Tuesday.   rosuvastatin (CRESTOR) 40 MG tablet Take 40 mg by mouth at bedtime.   Semaglutide,0.25 or 0.5MG /DOS, (OZEMPIC, 0.25  OR 0.5 MG/DOSE,) 2 MG/3ML SOPN Inject 0.5 mg into the skin once a week.   tamsulosin (FLOMAX) 0.4 MG CAPS capsule TAKE ONE CAPSULE BY MOUTH DAILY AFTER SUPPER   triamcinolone ointment (KENALOG) 0.5 % Apply 1 Application topically 4 (four) times daily as needed.   Continuous Blood Gluc Sensor (FREESTYLE LIBRE 14 DAY SENSOR) MISC USE AS DIRECTED EVERY 14 DAYS (Patient not taking: Reported on 03/11/2023)   Continuous Glucose Sensor (FREESTYLE LIBRE 2 SENSOR) MISC apply to the skin every 14 (fourteen) days. (Patient not taking: Reported on 03/11/2023)   dapagliflozin propanediol (FARXIGA)  10 MG TABS tablet Take 1 tablet (10 mg total) by mouth daily before breakfast. (Patient not taking: Reported on 03/11/2023)   diazepam (VALIUM) 5 MG tablet Take 1-2 tablets (5-10 mg total) by mouth every 8 (eight) hours as needed for muscle spasms. (Patient not taking: Reported on 03/11/2023)   furosemide (LASIX) 20 MG tablet Take 1 tablet (20 mg total) by mouth daily. (Patient not taking: Reported on 03/11/2023)   losartan (COZAAR) 100 MG tablet TAKE 1 TABLET BY MOUTH DAILY   methocarbamol (ROBAXIN) 750 MG tablet TAKE ONE TABLET BY MOUTH THREE TIMES DAILY AS NEEDED FOR MUSCLE SPASMS /pain (Patient not taking: Reported on 03/11/2023)   methylPREDNISolone (MEDROL DOSEPAK) 4 MG TBPK tablet As directed (Patient not taking: Reported on 03/11/2023)   ondansetron (ZOFRAN) 4 MG tablet Take 1 tablet (4 mg total) by mouth every 8 (eight) hours as needed for nausea or vomiting. (Patient not taking: Reported on 03/11/2023)   oxyCODONE-acetaminophen (PERCOCET) 10-325 MG tablet Take 1 tablet by mouth every 6 (six) hours as needed for pain. (Patient not taking: Reported on 03/11/2023)   Semaglutide, 1 MG/DOSE, (OZEMPIC, 1 MG/DOSE,) 2 MG/1.5ML SOPN Inject 0.25 mg into the skin once a week. Use 1 mg sq weekly. Can titrate up to 2 mg weekly if tolerated (Patient not taking: Reported on 03/11/2023)   No facility-administered encounter medications on file as of 03/11/2023.    Allergies (verified) Amoxicillin, Dilaudid [hydromorphone], Lisinopril, Simvastatin, and Penicillins   History: Past Medical History:  Diagnosis Date   Arthritis    knees   Bursitis of left shoulder    Cancer (HCC)    skin   Cervical disc disease    Chronic kidney disease    stage 3   Diabetes mellitus, type 2 (HCC)    Diverticulosis of colon    Erectile dysfunction    Exogenous obesity    History of anemia    History of kidney stones    Hyperlipidemia    Hypertension    Internal hemorrhoids    Internal hemorrhoids with complication -  fecal smearing 09/11/2018   Neuromuscular disorder (HCC)    neuropathy in feet   OSA (obstructive sleep apnea)    cpap   Testicular hypofunction    Past Surgical History:  Procedure Laterality Date   ANTERIOR CERVICAL DECOMP/DISCECTOMY FUSION  2008   some limit to ROM   COLONOSCOPY     POSTERIOR LAMINECTOMY / DECOMPRESSION CERVICAL SPINE  2009   c5-6 fusion  Dr. Blanche East. with nerve damage to fingers Rt arm   UMBILICAL HERNIA REPAIR N/A 03/13/2021   Procedure: LAPAROSCOPIC UMBILICAL HERNIA REPAIR;  Surgeon: Luretha Murphy, MD;  Location: [facility];  Service: General;  Laterality: N/A;   Family History  Problem Relation Age of Onset   Heart disease Father        Died of MI at age 39   Breast cancer Mother  Diabetes Paternal Grandmother    Healthy Son    Colon cancer Neg Hx    Social History   Socioeconomic History   Marital status: Married    Spouse name: Not on file   Number of children: 1   Years of education: Not on file   Highest education level: Not on file  Occupational History   Occupation: Information systems manager for [employer]   Occupation: SUPERVISOR    Employer: [employer], [employer]   Occupation: retired 2018  Tobacco Use   Smoking status: Former    Current packs/day: 0.00    Types: Cigarettes    Quit date: 01/20/1981    Years since quitting: 42.1   Smokeless tobacco: Never  Vaping Use   Vaping status: Never Used  Substance and Sexual Activity   Alcohol use: Yes    Alcohol/week: 7.0 standard drinks of alcohol    Types: 4 Glasses of wine, 3 Standard drinks or equivalent per week    Comment: He drinks 3-4 glasses of wine several times per week.   Drug use: No   Sexual activity: Yes  Other Topics Concern   Not on file  Social History Narrative   Lives with wife.  Has 1 son. Two story house   Right handed    Retired.  [employer].  Works part-time at [employer]: high school.   Occasional alcohol former smoker no drug use    Social Determinants of Corporate investment banker Strain: Low Risk  (03/09/2023)   Overall Financial Resource Strain (CARDIA)    Difficulty of Paying Living Expenses: Not hard at all  Food Insecurity: No Food Insecurity (03/09/2023)   Hunger Vital Sign    Worried About Running Out of Food in the Last Year: Never true    Ran Out of Food in the Last Year: Never true  Transportation Needs: No Transportation Needs (03/09/2023)   PRAPARE - Administrator, Civil Service (Medical): No    Lack of Transportation (Non-Medical): No  Physical Activity: Insufficiently Active (03/09/2023)   Exercise Vital Sign    Days of Exercise per Week: 6 days    Minutes of Exercise per Session: 10 min  Stress: No Stress Concern Present (03/09/2023)   [employer] of Occupational Health - Occupational Stress Questionnaire    Feeling of Stress : Not at all  Social Connections: Unknown (03/09/2023)   Social Connection and Isolation Panel [NHANES]    Frequency of Communication with Friends and Family: More than three times a week    Frequency of Social Gatherings with Friends and Family: More than three times a week    Attends Religious Services: Not on Marketing executive or Organizations: No    Attends Banker Meetings: Never    Marital Status: Married    Tobacco Counseling Counseling given: Not Answered   Clinical Intake:  Pre-visit preparation completed: Yes  Pain : No/denies pain     Nutritional Risks: Nausea/ vomitting/ diarrhea (little nausea) Diabetes: Yes CBG done?: No Did pt. bring in CBG monitor from home?: No  How often do you need to have someone help you when you read instructions, pamphlets, or other written materials from your doctor or pharmacy?: 1 - Never  Interpreter Needed?: No  Information entered by :: NAllen LPN   Activities of Daily Living    03/09/2023   12:15 PM  In your present state of health, do you have  any  difficulty performing the following activities:  Hearing? 0  Vision? 0  Difficulty concentrating or making decisions? 0  Walking or climbing stairs? 0  Dressing or bathing? 0  Doing errands, shopping? 0  Preparing Food and eating ? N  Using the Toilet? N  In the past six months, have you accidently leaked urine? N  Do you have problems with loss of bowel control? N  Managing your Medications? N  Managing your Finances? N  Housekeeping or managing your Housekeeping? N    Patient Care Team: Plotnikov, Georgina Quint, MD as PCP - General (Internal Medicine) Ihor Gully, MD (Inactive) as Consulting Physician (Urology) Dorisann Frames, MD as Referring Physician (Endocrinology) Terrial Rhodes, MD as Consulting Physician (Nephrology)  Indicate any recent Medical Services you may have received from other than [facility] providers in the past year (date may be approximate).     Assessment:   This is a routine wellness examination for [employer].  Hearing/Vision screen Hearing Screening - Comments:: Denies hearing issues Vision Screening - Comments:: Regular eye exams   Goals Addressed             This Visit's Progress    Patient Stated       03/11/2023, wants to control A1C       Depression Screen    03/11/2023    2:38 PM 07/07/2022    1:51 PM 06/30/2022    9:21 AM 12/24/2021    9:52 AM 08/26/2021    3:20 PM 09/01/2020    9:24 AM 08/28/2019    8:35 AM  PHQ 2/9 Scores  PHQ - 2 Score 0 0 0 0 0 0 0  PHQ- 9 Score 0 3    0     Fall Risk    03/09/2023   12:15 PM 06/30/2022    9:20 AM 12/24/2021    9:49 AM 08/26/2021    3:19 PM 02/28/2020    9:29 AM  Fall Risk   Falls in the past year? 0 0 0 0 0  Number falls in past yr: 0 0 0 0 0  Injury with Fall? 0 0 0 0 0  Risk for fall due to : Medication side effect No Fall Risks No Fall Risks No Fall Risks   Follow up Falls prevention discussed;Falls evaluation completed Falls evaluation completed Falls evaluation completed Falls  evaluation completed     MEDICARE RISK AT HOME: Medicare Risk at Home Any stairs in or around the home?: Yes If so, are there any without handrails?: Yes Home free of loose throw rugs in walkways, pet beds, electrical cords, etc?: No Adequate lighting in your home to reduce risk of falls?: Yes Life alert?: No Use of a cane, walker or w/c?: No Grab bars in the bathroom?: No Shower chair or bench in shower?: No Elevated toilet seat or a handicapped toilet?: Yes  TIMED UP AND GO:  Was the test performed?  No    Cognitive Function:        03/11/2023    2:38 PM 12/24/2021    9:59 AM  6CIT Screen  What Year? 0 points 0 points  What month? 0 points 0 points  What time? 0 points 0 points  Count back from 20 0 points 0 points  Months in reverse 0 points 0 points  Repeat phrase 0 points 0 points  Total Score 0 points 0 points    Immunizations Immunization History  Administered Date(s) Administered   Fluad Quad(high Dose 65+) 01/08/2019, 02/04/2021,  02/15/2022, 03/01/2023   Influenza Split 02/17/2011, 02/22/2012   Influenza Whole 05/10/2005, 02/07/2009, 02/03/2010   Influenza, High Dose Seasonal PF 02/28/2017, 03/07/2018   Influenza,inj,Quad PF,6+ Mos 01/17/2013, 12/31/2015   Influenza-Unspecified 02/06/2014, 02/05/2015   PFIZER(Purple Top)SARS-COV-2 Vaccination 06/19/2019, 07/14/2019, 02/19/2020   Pneumococcal Conjugate-13 03/26/2015   Pneumococcal Polysaccharide-23 05/10/2002, 07/05/2017   Td 04/11/2008   Zoster Recombinant(Shingrix) 09/14/2021, 12/21/2021    TDAP status: Due, Education has been provided regarding the importance of this vaccine. Advised may receive this vaccine at local pharmacy or Health Dept. Aware to provide a copy of the vaccination record if obtained from local pharmacy or Health Dept. Verbalized acceptance and understanding.  Flu Vaccine status: Up to date  Pneumococcal vaccine status: Up to date  Covid-19 vaccine status: Information provided on  how to obtain vaccines.   Qualifies for Shingles Vaccine? Yes   Zostavax completed Yes   Shingrix Completed?: Yes  Screening Tests Health Maintenance  Topic Date Due   FOOT EXAM  02/28/2018   DTaP/Tdap/Td (2 - Tdap) 04/11/2018   Diabetic kidney evaluation - Urine ACR  08/19/2021   HEMOGLOBIN A1C  08/26/2021   Diabetic kidney evaluation - eGFR measurement  02/25/2022   OPHTHALMOLOGY EXAM  01/27/2023   Colonoscopy  04/11/2023   COVID-19 Vaccine (4 - 2023-24 season) 03/27/2023 (Originally 01/09/2023)   Medicare Annual Wellness (AWV)  03/10/2024   Pneumonia Vaccine 62+ Years old  Completed   INFLUENZA VACCINE  Completed   Hepatitis C Screening  Completed   Zoster Vaccines- Shingrix  Completed   HPV VACCINES  Aged Out    Health Maintenance  Health Maintenance Due  Topic Date Due   FOOT EXAM  02/28/2018   DTaP/Tdap/Td (2 - Tdap) 04/11/2018   Diabetic kidney evaluation - Urine ACR  08/19/2021   HEMOGLOBIN A1C  08/26/2021   Diabetic kidney evaluation - eGFR measurement  02/25/2022   OPHTHALMOLOGY EXAM  01/27/2023   Colonoscopy  04/11/2023    Colorectal cancer screening: Type of screening: Colonoscopy. Completed 04/10/2013. Repeat every 10 years  Lung Cancer Screening: (Low Dose CT Chest recommended if Age 24-80 years, 20 pack-year currently smoking OR have quit w/in 15years.) does not qualify.   Lung Cancer Screening Referral: no  Additional Screening:  Hepatitis C Screening: does qualify; Completed 04/09/2015  Vision Screening: Recommended annual ophthalmology exams for early detection of glaucoma and other disorders of the eye. Is the patient up to date with their annual eye exam?  No  Who is the provider or what is the name of the office in which the patient attends annual eye exams? none If pt is not established with a provider, would they like to be referred to a provider to establish care? No .   Dental Screening: Recommended annual dental exams for proper oral  hygiene  Diabetic Foot Exam: Diabetic Foot Exam: Overdue, Pt has been advised about the importance in completing this exam. Pt is scheduled for diabetic foot exam on next appointment.  Community Resource Referral / Chronic Care Management: CRR required this visit?  No   CCM required this visit?  No     Plan:     I have personally reviewed and noted the following in the patient's chart:   Medical and social history Use of alcohol, tobacco or illicit drugs  Current medications and supplements including opioid prescriptions. Patient is not currently taking opioid prescriptions. Functional ability and status Nutritional status Physical activity Advanced directives List of other physicians Hospitalizations, surgeries, and ER visits in  previous 12 months Vitals Screenings to include cognitive, depression, and falls Referrals and appointments  In addition, I have reviewed and discussed with patient certain preventive protocols, quality metrics, and best practice recommendations. A written personalized care plan for preventive services as well as general preventive health recommendations were provided to patient.     Barb Merino, LPN   16/05/958   After Visit Summary: (MyChart) Due to this being a telephonic visit, the after visit summary with patients personalized plan was offered to patient via MyChart   Nurse Notes: none  Medical screening examination/treatment/procedure(s) were performed by non-physician practitioner and as supervising physician I was immediately available for consultation/collaboration.  I agree with above. Jacinta Shoe, MD

## 2023-03-11 NOTE — Patient Instructions (Signed)
[name] , Thank you for taking time to come for your Medicare Wellness Visit. I appreciate your ongoing commitment to your health goals. Please review the following plan we discussed and let me know if I can assist you in the future.   Referrals/Orders/Follow-Ups/Clinician Recommendations: none  This is a list of the screening recommended for you and due dates:  Health Maintenance  Topic Date Due   Complete foot exam   02/28/2018   DTaP/Tdap/Td vaccine (2 - Tdap) 04/11/2018   Yearly kidney health urinalysis for diabetes  08/19/2021   Hemoglobin A1C  08/26/2021   Yearly kidney function blood test for diabetes  02/25/2022   Eye exam for diabetics  01/27/2023   Colon Cancer Screening  04/11/2023   COVID-19 Vaccine (4 - 2023-24 season) 03/27/2023*   Medicare Annual Wellness Visit  03/10/2024   Pneumonia Vaccine  Completed   Flu Shot  Completed   Hepatitis C Screening  Completed   Zoster (Shingles) Vaccine  Completed   HPV Vaccine  Aged Out  *Topic was postponed. The date shown is not the original due date.    Advanced directives: (Copy Requested) Please bring a copy of your health care power of attorney and living will to the office to be added to your chart at your convenience.  Next Medicare Annual Wellness Visit scheduled for next year: Yes  insert Preventive Care attachment Insert FALL PREVENTION attachment if needed

## 2023-03-15 DIAGNOSIS — L700 Acne vulgaris: Secondary | ICD-10-CM | POA: Diagnosis not present

## 2023-03-15 DIAGNOSIS — L570 Actinic keratosis: Secondary | ICD-10-CM | POA: Diagnosis not present

## 2023-03-15 DIAGNOSIS — L821 Other seborrheic keratosis: Secondary | ICD-10-CM | POA: Diagnosis not present

## 2023-03-15 DIAGNOSIS — C44622 Squamous cell carcinoma of skin of right upper limb, including shoulder: Secondary | ICD-10-CM | POA: Diagnosis not present

## 2023-03-17 DIAGNOSIS — E1165 Type 2 diabetes mellitus with hyperglycemia: Secondary | ICD-10-CM | POA: Diagnosis not present

## 2023-03-24 ENCOUNTER — Other Ambulatory Visit (HOSPITAL_COMMUNITY): Payer: Self-pay

## 2023-03-24 DIAGNOSIS — I10 Essential (primary) hypertension: Secondary | ICD-10-CM | POA: Diagnosis not present

## 2023-03-24 DIAGNOSIS — R609 Edema, unspecified: Secondary | ICD-10-CM | POA: Diagnosis not present

## 2023-03-24 DIAGNOSIS — E7800 Pure hypercholesterolemia, unspecified: Secondary | ICD-10-CM | POA: Diagnosis not present

## 2023-03-24 DIAGNOSIS — E1165 Type 2 diabetes mellitus with hyperglycemia: Secondary | ICD-10-CM | POA: Diagnosis not present

## 2023-03-24 MED ORDER — MOUNJARO 2.5 MG/0.5ML ~~LOC~~ SOAJ
2.5000 mg | SUBCUTANEOUS | 1 refills | Status: DC
  Filled 2023-03-24: qty 2, 28d supply, fill #0

## 2023-03-28 DIAGNOSIS — C44622 Squamous cell carcinoma of skin of right upper limb, including shoulder: Secondary | ICD-10-CM | POA: Diagnosis not present

## 2023-03-30 ENCOUNTER — Encounter: Payer: Self-pay | Admitting: Internal Medicine

## 2023-03-31 ENCOUNTER — Encounter: Payer: Self-pay | Admitting: Internal Medicine

## 2023-03-31 ENCOUNTER — Ambulatory Visit (INDEPENDENT_AMBULATORY_CARE_PROVIDER_SITE_OTHER): Payer: Medicare Other | Admitting: Internal Medicine

## 2023-03-31 VITALS — BP 120/70 | HR 70 | Temp 98.7°F | Wt 262.8 lb

## 2023-03-31 DIAGNOSIS — Z6837 Body mass index (BMI) 37.0-37.9, adult: Secondary | ICD-10-CM

## 2023-03-31 DIAGNOSIS — E1165 Type 2 diabetes mellitus with hyperglycemia: Secondary | ICD-10-CM

## 2023-03-31 DIAGNOSIS — Z1211 Encounter for screening for malignant neoplasm of colon: Secondary | ICD-10-CM

## 2023-03-31 DIAGNOSIS — I10 Essential (primary) hypertension: Secondary | ICD-10-CM | POA: Diagnosis not present

## 2023-03-31 DIAGNOSIS — Z794 Long term (current) use of insulin: Secondary | ICD-10-CM | POA: Diagnosis not present

## 2023-03-31 DIAGNOSIS — R059 Cough, unspecified: Secondary | ICD-10-CM | POA: Diagnosis not present

## 2023-03-31 MED ORDER — HYDROCOD POLI-CHLORPHE POLI ER 10-8 MG/5ML PO SUER: 5.0000 mL | Freq: Two times a day (BID) | ORAL | 0 refills | Status: DC | PRN

## 2023-03-31 MED ORDER — EMPAGLIFLOZIN 10 MG PO TABS: 10.0000 mg | ORAL_TABLET | Freq: Every day | ORAL | 3 refills | Status: DC

## 2023-03-31 NOTE — Assessment & Plan Note (Signed)
  On Mounjaro now Added Jardiance

## 2023-03-31 NOTE — Progress Notes (Signed)
Subjective:  Patient ID: [name] [name], male    DOB: 1950/06/14  Age: 72 y.o. MRN: [id number]  CC: Follow-up (3 Month Follow Up)   HPI Dylan Traver presents for chronic cough On Mounjaro for DM this week F/u on HTN, DM, LBP On CPAP  Outpatient Medications Prior to Visit  Medication Sig Dispense Refill   amLODipine (NORVASC) 5 MG tablet Take 1 tablet (5 mg total) by mouth daily. 90 tablet 3   B-D ULTRAFINE III SHORT PEN 31G X 8 MM MISC USE AS DIRECTED 200 each 3   Cholecalciferol (VITAMIN D) 50 MCG (2000 UT) tablet Take 2,000 Units by mouth daily.     clobetasol (TEMOVATE) 0.05 % external solution Apply 1 application. topically 2 (two) times daily. 50 mL 2   cloNIDine (CATAPRES) 0.1 MG tablet TAKE 1 TABLET BY MOUTH THREE TIMES DAILY AS NEEDED. TAKE IF SYSTOLIC BLOOD PRESSURE>170 90 tablet 2   doxylamine, Sleep, (UNISOM) 25 MG tablet Take 25 mg by mouth at bedtime as needed for sleep.     gabapentin (NEURONTIN) 600 MG tablet TAKE 1 TABLET BY MOUTH TWICE DAILY 180 tablet 3   glimepiride (AMARYL) 2 MG tablet Take 2 mg by mouth daily.     insulin glargine, 1 Unit Dial, (TOUJEO) 300 UNIT/ML Solostar Pen Inject 48 Units into the skin daily before breakfast.     JARDIANCE 10 MG TABS tablet TAKE 1 TABLET BY MOUTH DAILY 90 tablet 3   losartan (COZAAR) 100 MG tablet TAKE 1 TABLET BY MOUTH DAILY 90 tablet 3   methocarbamol (ROBAXIN) 750 MG tablet TAKE ONE TABLET BY MOUTH THREE TIMES DAILY AS NEEDED FOR MUSCLE SPASMS /pain 30 tablet 1   metoprolol succinate (TOPROL-XL) 25 MG 24 hr tablet TAKE 1 TABLET BY MOUTH IN THE MORNING AND AT BEDTIME 180 tablet 3   rosuvastatin (CRESTOR) 40 MG tablet Take 40 mg by mouth at bedtime.     Semaglutide,0.25 or 0.5MG /DOS, (OZEMPIC, 0.25 OR 0.5 MG/DOSE,) 2 MG/3ML SOPN Inject 0.5 mg into the skin once a week. 3 mL 5   tamsulosin (FLOMAX) 0.4 MG CAPS capsule TAKE ONE CAPSULE BY MOUTH DAILY AFTER SUPPER 90 capsule 3   tirzepatide (MOUNJARO) 2.5 MG/0.5ML Pen  Inject 2.5 mg into the skin once a week. 2 mL 1   triamcinolone ointment (KENALOG) 0.5 % Apply 1 Application topically 4 (four) times daily as needed. 120 g 3   furosemide (LASIX) 20 MG tablet Take 1 tablet (20 mg total) by mouth daily. (Patient not taking: Reported on 03/11/2023) 30 tablet 5   ondansetron (ZOFRAN) 4 MG tablet Take 1 tablet (4 mg total) by mouth every 8 (eight) hours as needed for nausea or vomiting. (Patient not taking: Reported on 03/11/2023) 20 tablet 0   chlorpheniramine-HYDROcodone (TUSSIONEX) 10-8 MG/5ML Take 5 mLs by mouth every 12 (twelve) hours as needed for cough. (Patient not taking: Reported on 03/31/2023) 230 mL 0   Continuous Blood Gluc Sensor (FREESTYLE LIBRE 14 DAY SENSOR) MISC USE AS DIRECTED EVERY 14 DAYS (Patient not taking: Reported on 03/11/2023)     Continuous Glucose Sensor (FREESTYLE LIBRE 2 SENSOR) MISC apply to the skin every 14 (fourteen) days. (Patient not taking: Reported on 03/11/2023) 2 each 11   dapagliflozin propanediol (FARXIGA) 10 MG TABS tablet Take 1 tablet (10 mg total) by mouth daily before breakfast. (Patient not taking: Reported on 03/11/2023) 90 tablet 3   diazepam (VALIUM) 5 MG tablet Take 1-2 tablets (5-10 mg total) by mouth every 8 (  eight) hours as needed for muscle spasms. (Patient not taking: Reported on 03/11/2023) 60 tablet 1   losartan (COZAAR) 100 MG tablet TAKE 1 TABLET BY MOUTH DAILY 90 tablet 3   methylPREDNISolone (MEDROL DOSEPAK) 4 MG TBPK tablet As directed (Patient not taking: Reported on 03/11/2023) 21 tablet 0   oxyCODONE-acetaminophen (PERCOCET) 10-325 MG tablet Take 1 tablet by mouth every 6 (six) hours as needed for pain. (Patient not taking: Reported on 03/11/2023) 20 tablet 0   OZEMPIC, 0.25 OR 0.5 MG/DOSE, 2 MG/1.5ML SOPN Inject 0.25 mg into the skin every Tuesday. (Patient not taking: Reported on 03/31/2023)     Semaglutide, 1 MG/DOSE, (OZEMPIC, 1 MG/DOSE,) 2 MG/1.5ML SOPN Inject 0.25 mg into the skin once a week. Use 1 mg sq  weekly. Can titrate up to 2 mg weekly if tolerated (Patient not taking: Reported on 03/11/2023) 1.5 mL 3   No facility-administered medications prior to visit.    ROS: Review of Systems  Constitutional:  Negative for appetite change, fatigue and unexpected weight change.  HENT:  Negative for congestion, nosebleeds, sneezing, sore throat and trouble swallowing.   Eyes:  Negative for itching and visual disturbance.  Respiratory:  Positive for cough.   Cardiovascular:  Negative for chest pain, palpitations and leg swelling.  Gastrointestinal:  Negative for abdominal distention, blood in stool, diarrhea and nausea.  Genitourinary:  Negative for frequency and hematuria.  Musculoskeletal:  Negative for back pain, gait problem, joint swelling and neck pain.  Skin:  Negative for rash.  Neurological:  Negative for dizziness, tremors, speech difficulty and weakness.  Psychiatric/Behavioral:  Negative for agitation, dysphoric mood and sleep disturbance. The patient is not nervous/anxious.     Objective:  BP 120/70   Pulse 70   Temp 98.7 F (37.1 C) (Oral)   Wt 262 lb 12.8 oz (119.2 kg)   SpO2 97%   BMI 37.71 kg/m   BP Readings from Last 3 Encounters:  03/31/23 120/70  12/29/22 110/62  09/07/22 (!) 148/70    Wt Readings from Last 3 Encounters:  03/31/23 262 lb 12.8 oz (119.2 kg)  12/29/22 258 lb (117 kg)  09/07/22 249 lb 3.2 oz (113 kg)    Physical Exam Constitutional:      General: He is not in acute distress.    Appearance: He is well-developed. He is obese.     Comments: NAD  Eyes:     Conjunctiva/sclera: Conjunctivae normal.     Pupils: Pupils are equal, round, and reactive to light.  Neck:     Thyroid: No thyromegaly.     Vascular: No JVD.  Cardiovascular:     Rate and Rhythm: Normal rate and regular rhythm.     Heart sounds: Normal heart sounds. No murmur heard.    No friction rub. No gallop.  Pulmonary:     Effort: Pulmonary effort is normal. No respiratory  distress.     Breath sounds: Normal breath sounds. No wheezing or rales.  Chest:     Chest wall: No tenderness.  Abdominal:     General: Bowel sounds are normal. There is no distension.     Palpations: Abdomen is soft. There is no mass.     Tenderness: There is no abdominal tenderness. There is no guarding or rebound.  Musculoskeletal:        General: No tenderness. Normal range of motion.     Cervical back: Normal range of motion.  Lymphadenopathy:     Cervical: No cervical adenopathy.  Skin:  General: Skin is warm and dry.     Findings: No rash.  Neurological:     Mental Status: He is alert and oriented to person, place, and time.     Cranial Nerves: No cranial nerve deficit.     Motor: No abnormal muscle tone.     Coordination: Coordination normal.     Gait: Gait normal.     Deep Tendon Reflexes: Reflexes are normal and symmetric.  Psychiatric:        Behavior: Behavior normal.        Thought Content: Thought content normal.        Judgment: Judgment normal.   LS - stiff  Lab Results  Component Value Date   WBC 6.6 02/25/2021   HGB 13.6 02/25/2021   HCT 41.9 02/25/2021   PLT 264 02/25/2021   GLUCOSE 143 (H) 02/25/2021   CHOL 165 08/19/2020   TRIG 111.0 08/19/2020   HDL 44.60 08/19/2020   LDLDIRECT 200.0 02/17/2016   LDLCALC 98 08/19/2020   ALT 14 08/19/2020   AST 14 08/19/2020   NA 139 02/25/2021   K 4.0 02/25/2021   CL 104 02/25/2021   CREATININE 1.54 (H) 02/25/2021   BUN 36 (H) 02/25/2021   CO2 25 02/25/2021   TSH 3.46 08/19/2020   PSA 0.73 02/04/2021   INR 1.08 02/02/2011   HGBA1C 7.2 (H) 02/25/2021   MICROALBUR 182.1 (H) 08/19/2020    MR Lumbar Spine Wo Contrast  Result Date: 07/31/2022 CLINICAL DATA:  Low back pain radiating to the right buttock and right leg for 3 weeks EXAM: MRI LUMBAR SPINE WITHOUT CONTRAST TECHNIQUE: Multiplanar, multisequence MR imaging of the lumbar spine was performed. No intravenous contrast was administered. COMPARISON:   None Available. FINDINGS: Segmentation:  Standard. Alignment:  Physiologic. Vertebrae: No acute fracture, evidence of discitis, or aggressive bone lesion. Conus medullaris and cauda equina: Conus extends to the L1 level. Conus and cauda equina appear normal. Paraspinal and other soft tissues: No acute paraspinal abnormality. Disc levels: Disc spaces: Degenerative disease with disc height loss at L2-3. Disc desiccation at L3-4 and L4-5. T12-L1: No significant disc bulge. No neural foraminal stenosis. No central canal stenosis. L1-L2: No significant disc bulge. Mild bilateral facet arthropathy. No foraminal or central canal stenosis. L2-L3: Mild broad-based disc bulge. Mild bilateral facet arthropathy. Moderate right foraminal stenosis. No left foraminal stenosis. No spinal stenosis. L3-L4: Broad-based disc bulge with a broad right paracentral disc protrusion with mass effect of the right intraspinal L4 nerve root. Moderate bilateral facet arthropathy with ligamentum flavum infolding. Moderate spinal stenosis. Mild left foraminal stenosis. Moderate right foraminal stenosis. L4-L5: Broad-based disc bulge. Moderate bilateral facet arthropathy. Mild spinal stenosis. Mild bilateral foraminal stenosis. L5-S1: No disc protrusion, foraminal stenosis or central canal stenosis. Moderate bilateral facet arthropathy. IMPRESSION: 1. At L3-4 there is a broad-based disc bulge with a broad right paracentral disc protrusion with mass effect of the right intraspinal L4 nerve root. Moderate bilateral facet arthropathy. Moderate spinal stenosis. Mild left foraminal stenosis. Moderate right foraminal stenosis. 2. At L2-3 there is a mild broad-based disc bulge. Mild bilateral facet arthropathy. Moderate right foraminal stenosis. 3. At L4-5 there is a broad-based disc bulge. Moderate bilateral facet arthropathy. Mild spinal stenosis. Mild bilateral foraminal stenosis. 4. No acute osseous injury of the lumbar spine. Electronically Signed    By: Elige Ko M.D.   On: 07/31/2022 06:32    Assessment & Plan:   Problem List Items Addressed This Visit     DM2 (  diabetes mellitus, type 2) (HCC)     LBP, radiculitis - s/p surgery Oxy po if pain returned Jardiance is too $$$ - Rx for [place] D/c Ozempic On Mounjaro now      Relevant Medications   empagliflozin (JARDIANCE) 10 MG TABS tablet   Obesity, morbid (HCC)     On Mounjaro now Added Jardiance      Relevant Medications   empagliflozin (JARDIANCE) 10 MG TABS tablet   Essential hypertension    Loose wt On Mounjaro now Added Jardiance      Cough in adult - Primary    Recurrent/ chronic, primarily at nighttime.  Could be CPAP use related.  Negative workup Tussionex prn - rare use  Potential benefits of opioids use as well as potential risks (i.e. addiction risk, apnea etc) and complications (i.e. Somnolence, constipation and others) were explained to the patient and were aknowledged.      Other Visit Diagnoses     Screening for colon cancer       Relevant Orders   Cologuard         Meds ordered this encounter  Medications   DISCONTD: chlorpheniramine-HYDROcodone (TUSSIONEX) 10-8 MG/5ML    Sig: Take 5 mLs by mouth every 12 (twelve) hours as needed for cough.    Dispense:  230 mL    Refill:  0   empagliflozin (JARDIANCE) 10 MG TABS tablet    Sig: Take 1 tablet (10 mg total) by mouth daily.    Dispense:  90 tablet    Refill:  3   DISCONTD: chlorpheniramine-HYDROcodone (TUSSIONEX) 10-8 MG/5ML    Sig: Take 5 mLs by mouth every 12 (twelve) hours as needed for cough.    Dispense:  230 mL    Refill:  0   chlorpheniramine-HYDROcodone (TUSSIONEX) 10-8 MG/5ML    Sig: Take 5 mLs by mouth every 12 (twelve) hours as needed for cough.    Dispense:  230 mL    Refill:  0      Follow-up: Return in about 4 months (around 07/29/2023) for a follow-up visit.  Sonda Primes, MD

## 2023-03-31 NOTE — Assessment & Plan Note (Signed)
Loose wt On Mounjaro now Added Jardiance

## 2023-03-31 NOTE — Assessment & Plan Note (Signed)
LBP, radiculitis - s/p surgery Oxy po if pain returned Jardiance is too $$$ - Rx for [place] D/c Ozempic On Mounjaro now

## 2023-04-06 ENCOUNTER — Encounter: Payer: Self-pay | Admitting: Internal Medicine

## 2023-04-06 NOTE — Telephone Encounter (Signed)
Care team updated and letter sent for eye exam notes.

## 2023-04-10 NOTE — Assessment & Plan Note (Signed)
Recurrent/ chronic, primarily at nighttime.  Could be CPAP use related.  Negative workup Tussionex prn - rare use  Potential benefits of opioids use as well as potential risks (i.e. addiction risk, apnea etc) and complications (i.e. Somnolence, constipation and others) were explained to the patient and were aknowledged.

## 2023-04-11 DIAGNOSIS — L853 Xerosis cutis: Secondary | ICD-10-CM | POA: Diagnosis not present

## 2023-04-11 DIAGNOSIS — L209 Atopic dermatitis, unspecified: Secondary | ICD-10-CM | POA: Diagnosis not present

## 2023-04-11 DIAGNOSIS — C44622 Squamous cell carcinoma of skin of right upper limb, including shoulder: Secondary | ICD-10-CM | POA: Diagnosis not present

## 2023-04-11 DIAGNOSIS — L820 Inflamed seborrheic keratosis: Secondary | ICD-10-CM | POA: Diagnosis not present

## 2023-04-11 DIAGNOSIS — L570 Actinic keratosis: Secondary | ICD-10-CM | POA: Diagnosis not present

## 2023-04-19 ENCOUNTER — Other Ambulatory Visit (HOSPITAL_COMMUNITY): Payer: Self-pay

## 2023-04-19 MED ORDER — MOUNJARO 5 MG/0.5ML ~~LOC~~ SOAJ
5.0000 mg | SUBCUTANEOUS | 5 refills | Status: DC
  Filled 2023-04-19: qty 2, 28d supply, fill #0
  Filled 2023-05-05: qty 2, 28d supply, fill #1
  Filled 2023-06-03: qty 2, 28d supply, fill #0
  Filled 2023-07-01: qty 2, 28d supply, fill #1

## 2023-05-02 DIAGNOSIS — K080 Exfoliation of teeth due to systemic causes: Secondary | ICD-10-CM | POA: Diagnosis not present

## 2023-05-02 DIAGNOSIS — G4733 Obstructive sleep apnea (adult) (pediatric): Secondary | ICD-10-CM | POA: Diagnosis not present

## 2023-05-05 ENCOUNTER — Other Ambulatory Visit (HOSPITAL_COMMUNITY): Payer: Self-pay

## 2023-05-13 ENCOUNTER — Other Ambulatory Visit: Payer: Self-pay

## 2023-05-13 ENCOUNTER — Encounter (HOSPITAL_BASED_OUTPATIENT_CLINIC_OR_DEPARTMENT_OTHER): Payer: Self-pay

## 2023-05-13 ENCOUNTER — Emergency Department (HOSPITAL_BASED_OUTPATIENT_CLINIC_OR_DEPARTMENT_OTHER): Payer: Medicare Other

## 2023-05-13 ENCOUNTER — Observation Stay (HOSPITAL_BASED_OUTPATIENT_CLINIC_OR_DEPARTMENT_OTHER)
Admission: EM | Admit: 2023-05-13 | Discharge: 2023-05-14 | Disposition: A | Discharge status: Home / Self Care | Payer: Medicare Other | Attending: Internal Medicine | Admitting: Internal Medicine

## 2023-05-13 DIAGNOSIS — R9431 Abnormal electrocardiogram [ECG] [EKG]: Secondary | ICD-10-CM

## 2023-05-13 DIAGNOSIS — Z85828 Personal history of other malignant neoplasm of skin: Secondary | ICD-10-CM | POA: Insufficient documentation

## 2023-05-13 DIAGNOSIS — E1165 Type 2 diabetes mellitus with hyperglycemia: Principal | ICD-10-CM | POA: Insufficient documentation

## 2023-05-13 DIAGNOSIS — R059 Cough, unspecified: Secondary | ICD-10-CM | POA: Diagnosis not present

## 2023-05-13 DIAGNOSIS — J449 Chronic obstructive pulmonary disease, unspecified: Secondary | ICD-10-CM | POA: Diagnosis not present

## 2023-05-13 DIAGNOSIS — J9601 Acute respiratory failure with hypoxia: Secondary | ICD-10-CM | POA: Diagnosis not present

## 2023-05-13 DIAGNOSIS — J122 Parainfluenza virus pneumonia: Secondary | ICD-10-CM | POA: Diagnosis not present

## 2023-05-13 DIAGNOSIS — I129 Hypertensive chronic kidney disease with stage 1 through stage 4 chronic kidney disease, or unspecified chronic kidney disease: Secondary | ICD-10-CM | POA: Insufficient documentation

## 2023-05-13 DIAGNOSIS — R778 Other specified abnormalities of plasma proteins: Secondary | ICD-10-CM | POA: Diagnosis not present

## 2023-05-13 DIAGNOSIS — N1831 Chronic kidney disease, stage 3a: Secondary | ICD-10-CM | POA: Diagnosis not present

## 2023-05-13 DIAGNOSIS — E1122 Type 2 diabetes mellitus with diabetic chronic kidney disease: Secondary | ICD-10-CM | POA: Diagnosis not present

## 2023-05-13 DIAGNOSIS — R739 Hyperglycemia, unspecified: Secondary | ICD-10-CM

## 2023-05-13 DIAGNOSIS — Z79899 Other long term (current) drug therapy: Secondary | ICD-10-CM | POA: Diagnosis not present

## 2023-05-13 DIAGNOSIS — J069 Acute upper respiratory infection, unspecified: Principal | ICD-10-CM

## 2023-05-13 DIAGNOSIS — R0602 Shortness of breath: Secondary | ICD-10-CM | POA: Diagnosis not present

## 2023-05-13 DIAGNOSIS — R0989 Other specified symptoms and signs involving the circulatory and respiratory systems: Secondary | ICD-10-CM | POA: Diagnosis not present

## 2023-05-13 DIAGNOSIS — Z20822 Contact with and (suspected) exposure to covid-19: Secondary | ICD-10-CM | POA: Diagnosis not present

## 2023-05-13 DIAGNOSIS — N179 Acute kidney failure, unspecified: Secondary | ICD-10-CM | POA: Insufficient documentation

## 2023-05-13 DIAGNOSIS — D649 Anemia, unspecified: Secondary | ICD-10-CM | POA: Insufficient documentation

## 2023-05-13 DIAGNOSIS — R0902 Hypoxemia: Secondary | ICD-10-CM

## 2023-05-13 DIAGNOSIS — Z87891 Personal history of nicotine dependence: Secondary | ICD-10-CM | POA: Diagnosis not present

## 2023-05-13 DIAGNOSIS — R7989 Other specified abnormal findings of blood chemistry: Secondary | ICD-10-CM | POA: Insufficient documentation

## 2023-05-13 DIAGNOSIS — J441 Chronic obstructive pulmonary disease with (acute) exacerbation: Secondary | ICD-10-CM | POA: Diagnosis present

## 2023-05-13 DIAGNOSIS — E119 Type 2 diabetes mellitus without complications: Secondary | ICD-10-CM

## 2023-05-13 LAB — CBC WITH DIFFERENTIAL/PLATELET
Abs Immature Granulocytes: 0.01 10*3/uL (ref 0.00–0.07)
Basophils Absolute: 0 10*3/uL (ref 0.0–0.1)
Basophils Relative: 0 %
Eosinophils Absolute: 0 10*3/uL (ref 0.0–0.5)
Eosinophils Relative: 1 %
HCT: 39.6 % (ref 39.0–52.0)
Hemoglobin: 12.8 g/dL — ABNORMAL LOW (ref 13.0–17.0)
Immature Granulocytes: 0 %
Lymphocytes Relative: 17 %
Lymphs Abs: 1.3 10*3/uL (ref 0.7–4.0)
MCH: 26.9 pg (ref 26.0–34.0)
MCHC: 32.3 g/dL (ref 30.0–36.0)
MCV: 83.2 fL (ref 80.0–100.0)
Monocytes Absolute: 0.8 10*3/uL (ref 0.1–1.0)
Monocytes Relative: 11 %
Neutro Abs: 5.5 10*3/uL (ref 1.7–7.7)
Neutrophils Relative %: 71 %
Platelets: 204 10*3/uL (ref 150–400)
RBC: 4.76 MIL/uL (ref 4.22–5.81)
RDW: 14.5 % (ref 11.5–15.5)
WBC: 7.7 10*3/uL (ref 4.0–10.5)
nRBC: 0 % (ref 0.0–0.2)

## 2023-05-13 LAB — GLUCOSE, CAPILLARY: Glucose-Capillary: 372 mg/dL — ABNORMAL HIGH (ref 70–99)

## 2023-05-13 LAB — COMPREHENSIVE METABOLIC PANEL
ALT: 21 U/L (ref 0–44)
AST: 27 U/L (ref 15–41)
Albumin: 3.2 g/dL — ABNORMAL LOW (ref 3.5–5.0)
Alkaline Phosphatase: 61 U/L (ref 38–126)
Anion gap: 10 (ref 5–15)
BUN: 32 mg/dL — ABNORMAL HIGH (ref 8–23)
CO2: 26 mmol/L (ref 22–32)
Calcium: 8.3 mg/dL — ABNORMAL LOW (ref 8.9–10.3)
Chloride: 101 mmol/L (ref 98–111)
Creatinine, Ser: 2.68 mg/dL — ABNORMAL HIGH (ref 0.61–1.24)
GFR, Estimated: 24 mL/min — ABNORMAL LOW (ref >60)
Glucose, Bld: 101 mg/dL — ABNORMAL HIGH (ref 70–99)
Potassium: 4.3 mmol/L (ref 3.5–5.1)
Sodium: 137 mmol/L (ref 135–145)
Total Bilirubin: 0.4 mg/dL (ref 0.0–1.2)
Total Protein: 7.1 g/dL (ref 6.5–8.1)

## 2023-05-13 LAB — RESP PANEL BY RT-PCR (RSV, FLU A&B, COVID)  RVPGX2
Influenza A by PCR: NEGATIVE
Influenza B by PCR: NEGATIVE
Resp Syncytial Virus by PCR: NEGATIVE
SARS Coronavirus 2 by RT PCR: NEGATIVE

## 2023-05-13 LAB — GROUP A STREP BY PCR: Group A Strep by PCR: NOT DETECTED

## 2023-05-13 LAB — TROPONIN I (HIGH SENSITIVITY)
Troponin I (High Sensitivity): 62 ng/L — ABNORMAL HIGH (ref <18)
Troponin I (High Sensitivity): 58 ng/L — ABNORMAL HIGH (ref <18)

## 2023-05-13 LAB — MAGNESIUM: Magnesium: 2 mg/dL (ref 1.7–2.4)

## 2023-05-13 MED ORDER — SODIUM CHLORIDE 0.9 % IV SOLN: Freq: Once | INTRAVENOUS | Status: AC

## 2023-05-13 MED ORDER — ACETAMINOPHEN 325 MG PO TABS
650.0000 mg | ORAL_TABLET | Freq: Once | ORAL | Status: AC
  Administered 2023-05-13: 650 mg via ORAL
  Filled 2023-05-13: qty 2

## 2023-05-13 MED ORDER — IPRATROPIUM-ALBUTEROL 0.5-2.5 (3) MG/3ML IN SOLN
3.0000 mL | Freq: Once | RESPIRATORY_TRACT | Status: AC
  Administered 2023-05-13: 3 mL via RESPIRATORY_TRACT
  Filled 2023-05-13: qty 3

## 2023-05-13 MED ORDER — KETOROLAC TROMETHAMINE 15 MG/ML IJ SOLN
15.0000 mg | Freq: Once | INTRAMUSCULAR | Status: AC
  Administered 2023-05-13: 15 mg via INTRAVENOUS
  Filled 2023-05-13: qty 1

## 2023-05-13 MED ORDER — IPRATROPIUM-ALBUTEROL 0.5-2.5 (3) MG/3ML IN SOLN
3.0000 mL | Freq: Once | RESPIRATORY_TRACT | Status: AC
Start: 2023-05-13 — End: 2023-05-13
  Administered 2023-05-13: 3 mL via RESPIRATORY_TRACT
  Filled 2023-05-13: qty 3

## 2023-05-13 MED ORDER — ALBUTEROL SULFATE HFA 108 (90 BASE) MCG/ACT IN AERS: 2.0000 | INHALATION_SPRAY | RESPIRATORY_TRACT | Status: DC | PRN

## 2023-05-13 MED ORDER — METHYLPREDNISOLONE SODIUM SUCC 125 MG IJ SOLR
125.0000 mg | Freq: Once | INTRAMUSCULAR | Status: AC
Start: 2023-05-13 — End: 2023-05-13
  Administered 2023-05-13: 125 mg via INTRAVENOUS
  Filled 2023-05-13: qty 2

## 2023-05-13 MED ORDER — ALBUTEROL SULFATE (2.5 MG/3ML) 0.083% IN NEBU
2.5000 mg | INHALATION_SOLUTION | RESPIRATORY_TRACT | Status: DC | PRN
  Administered 2023-05-13: 2.5 mg via RESPIRATORY_TRACT
  Filled 2023-05-13: qty 3

## 2023-05-13 NOTE — ED Notes (Signed)
 ED Provider at bedside.

## 2023-05-13 NOTE — ED Notes (Signed)
 SpO2 88-91% on room air, denies increased work of breathing.

## 2023-05-13 NOTE — ED Notes (Signed)
 ED TO INPATIENT HANDOFF REPORT  ED Nurse Name and Phone #: Tajha Sammarco RN  [phone]  S Name/Age/Gender [name] [name] 73 y.o. male Room/Bed: MH10/MH10  Code Status   Code Status: Prior  Home/SNF/Other Home Patient oriented to: self, place, time, and situation Is this baseline? Yes   Triage Complete: Triage complete  Chief Complaint COPD exacerbation (HCC) [J44.1]  Triage Note The patient has had a cough for one week and shortness of breath with a sore throat.    Allergies Allergies  Allergen Reactions   Amoxicillin Nausea And Vomiting   Dilaudid  [Hydromorphone ] Nausea And Vomiting   Lisinopril  Cough        Simvastatin Other (See Comments)    myalgia   Penicillins Rash    Level of Care/Admitting Diagnosis ED Disposition     ED Disposition  Admit   Condition  --   Comment  Hospital Area: [facility] [[id number]]  Level of Care: Telemetry [5]  Admit to tele based on following criteria: Other see comments  Comments: hypoxia  Interfacility transfer: Yes  May place patient in observation at [facility] or [facility] if equivalent level of care is available:: Yes  Covid Evaluation: Confirmed COVID Negative  Diagnosis: COPD exacerbation [facility]) [[id number]]  Admitting Physician: DEFAULT, PROVIDER [1]  Attending Physician: DEFAULT, PROVIDER [1]          B Medical/Surgery History Past Medical History:  Diagnosis Date   Arthritis    knees   Bursitis of left shoulder    Cancer (HCC)    skin   Cervical disc disease    Chronic kidney disease    stage 3   Diabetes mellitus, type 2 (HCC)    Diverticulosis of colon    Erectile dysfunction    Exogenous obesity    History of anemia    History of kidney stones    Hyperlipidemia    Hypertension    Internal hemorrhoids    Internal hemorrhoids with complication - fecal smearing 09/11/2018   Neuromuscular disorder (HCC)    neuropathy in feet   OSA (obstructive sleep apnea)    cpap   Testicular  hypofunction    Past Surgical History:  Procedure Laterality Date   ANTERIOR CERVICAL DECOMP/DISCECTOMY FUSION  2008   some limit to ROM   COLONOSCOPY     POSTERIOR LAMINECTOMY / DECOMPRESSION CERVICAL SPINE  2009   c5-6 fusion  Dr. Armen. with nerve damage to fingers Rt arm   UMBILICAL HERNIA REPAIR N/A 03/13/2021   Procedure: LAPAROSCOPIC UMBILICAL HERNIA REPAIR;  Surgeon: Gladis Cough, MD;  Location: [facility];  Service: General;  Laterality: N/A;     A IV Location/Drains/Wounds Patient Lines/Drains/Airways Status     Active Line/Drains/Airways     Name Placement date Placement time Site Days   Peripheral IV 05/13/23 20 G 1 Right Antecubital 05/13/23  1017  Antecubital  less than 1   Incision - 2 Ports Abdomen 1: Left;Lateral;Upper 2: Left;Lateral;Lower 03/13/21  --  -- 791            Intake/Output Last 24 hours  Intake/Output Summary (Last 24 hours) at 05/13/2023 1848 Last data filed at 05/13/2023 1631 Gross per 24 hour  Intake --  Output 225 ml  Net -225 ml    Labs/Imaging Results for orders placed or performed during the hospital encounter of 05/13/23 (from the past 48 hours)  Resp panel by RT-PCR (RSV, Flu A&B, Covid) Anterior Nasal Swab     Status: None  Collection Time: 05/13/23  9:52 AM   Specimen: Anterior Nasal Swab  Result Value Ref Range   SARS Coronavirus 2 by RT PCR NEGATIVE NEGATIVE    Comment: (NOTE) SARS-CoV-2 target nucleic acids are NOT DETECTED.  The SARS-CoV-2 RNA is generally detectable in upper respiratory specimens during the acute phase of infection. The lowest concentration of SARS-CoV-2 viral copies this assay can detect is 138 copies/mL. A negative result does not preclude SARS-Cov-2 infection and should not be used as the sole basis for treatment or other patient management decisions. A negative result may occur with  improper specimen collection/handling, submission of specimen other than nasopharyngeal swab, presence of viral  mutation(s) within the areas targeted by this assay, and inadequate number of viral copies(<138 copies/mL). A negative result must be combined with clinical observations, patient history, and epidemiological information. The expected result is Negative.  Fact Sheet for Patients:  bloggercourse.com  Fact Sheet for Healthcare Providers:  seriousbroker.it  This test is no t yet approved or cleared by the [place]  FDA and  has been authorized for detection and/or diagnosis of SARS-CoV-2 by FDA under an Emergency Use Authorization (EUA). This EUA will remain  in effect (meaning this test can be used) for the duration of the COVID-19 declaration under Section 564(b)(1) of the Act, 21 U.S.C.section 360bbb-3(b)(1), unless the authorization is terminated  or revoked sooner.       Influenza A by PCR NEGATIVE NEGATIVE   Influenza B by PCR NEGATIVE NEGATIVE    Comment: (NOTE) The Xpert Xpress SARS-CoV-2/FLU/RSV plus assay is intended as an aid in the diagnosis of influenza from Nasopharyngeal swab specimens and should not be used as a sole basis for treatment. Nasal washings and aspirates are unacceptable for Xpert Xpress SARS-CoV-2/FLU/RSV testing.  Fact Sheet for Patients: bloggercourse.com  Fact Sheet for Healthcare Providers: seriousbroker.it  This test is not yet approved or cleared by the [place]  FDA and has been authorized for detection and/or diagnosis of SARS-CoV-2 by FDA under an Emergency Use Authorization (EUA). This EUA will remain in effect (meaning this test can be used) for the duration of the COVID-19 declaration under Section 564(b)(1) of the Act, 21 U.S.C. section 360bbb-3(b)(1), unless the authorization is terminated or revoked.     Resp Syncytial Virus by PCR NEGATIVE NEGATIVE    Comment: (NOTE) Fact Sheet for  Patients: bloggercourse.com  Fact Sheet for Healthcare Providers: seriousbroker.it  This test is not yet approved or cleared by the [place]  FDA and has been authorized for detection and/or diagnosis of SARS-CoV-2 by FDA under an Emergency Use Authorization (EUA). This EUA will remain in effect (meaning this test can be used) for the duration of the COVID-19 declaration under Section 564(b)(1) of the Act, 21 U.S.C. section 360bbb-3(b)(1), unless the authorization is terminated or revoked.  Performed at [facility], [address] [place], [place] [address]   Group A Strep by PCR     Status: None   Collection Time: 05/13/23  9:52 AM   Specimen: Anterior Nasal Swab; Sterile Swab  Result Value Ref Range   Group A Strep by PCR NOT DETECTED NOT DETECTED    Comment: Performed at [facility], [address] [place], [place] [address]  CBC with Differential     Status: Abnormal   Collection Time: 05/13/23  9:58 AM  Result Value Ref Range   WBC 7.7 4.0 - 10.5 K/uL   RBC 4.76 4.22 - 5.81 MIL/uL  Hemoglobin 12.8 (L) 13.0 - 17.0 g/dL   HCT 60.3 60.9 - 47.9 %   MCV 83.2 80.0 - 100.0 fL   MCH 26.9 26.0 - 34.0 pg   MCHC 32.3 30.0 - 36.0 g/dL   RDW 85.4 88.4 - 84.4 %   Platelets 204 150 - 400 K/uL   nRBC 0.0 0.0 - 0.2 %   Neutrophils Relative % 71 %   Neutro Abs 5.5 1.7 - 7.7 K/uL   Lymphocytes Relative 17 %   Lymphs Abs 1.3 0.7 - 4.0 K/uL   Monocytes Relative 11 %   Monocytes Absolute 0.8 0.1 - 1.0 K/uL   Eosinophils Relative 1 %   Eosinophils Absolute 0.0 0.0 - 0.5 K/uL   Basophils Relative 0 %   Basophils Absolute 0.0 0.0 - 0.1 K/uL   Immature Granulocytes 0 %   Abs Immature Granulocytes 0.01 0.00 - 0.07 K/uL    Comment: Performed at [facility], [address] [place], [place] [address]  Comprehensive metabolic panel     Status: Abnormal   Collection Time: 05/13/23  9:58 AM  Result  Value Ref Range   Sodium 137 135 - 145 mmol/L   Potassium 4.3 3.5 - 5.1 mmol/L   Chloride 101 98 - 111 mmol/L   CO2 26 22 - 32 mmol/L   Glucose, Bld 101 (H) 70 - 99 mg/dL    Comment: Glucose reference range applies only to samples taken after fasting for at least 8 hours.   BUN 32 (H) 8 - 23 mg/dL   Creatinine, Ser 7.31 (H) 0.61 - 1.24 mg/dL   Calcium  8.3 (L) 8.9 - 10.3 mg/dL   Total Protein 7.1 6.5 - 8.1 g/dL   Albumin 3.2 (L) 3.5 - 5.0 g/dL   AST 27 15 - 41 U/L   ALT 21 0 - 44 U/L   Alkaline Phosphatase 61 38 - 126 U/L   Total Bilirubin 0.4 0.0 - 1.2 mg/dL   GFR, Estimated 24 (L) >60 mL/min    Comment: (NOTE) Calculated using the CKD-EPI Creatinine Equation (2021)    Anion gap 10 5 - 15    Comment: Performed at [facility], [address] [place], [place] [address]  Troponin I (High Sensitivity)     Status: Abnormal   Collection Time: 05/13/23 10:17 AM  Result Value Ref Range   Troponin I (High Sensitivity) 62 (H) <18 ng/L    Comment: (NOTE) Elevated high sensitivity troponin I (hsTnI) values and significant  changes across serial measurements may suggest ACS but many other  chronic and acute conditions are known to elevate hsTnI results.  Refer to the Links section for chest pain algorithms and additional  guidance. Performed at [facility], [address] [place], [place] [address]   Magnesium     Status: None   Collection Time: 05/13/23 10:17 AM  Result Value Ref Range   Magnesium 2.0 1.7 - 2.4 mg/dL    Comment: Performed at [facility], [address] [place], [place] [address]  Troponin I (High Sensitivity)     Status: Abnormal   Collection Time: 05/13/23 12:17 PM  Result Value Ref Range   Troponin I (High Sensitivity) 58 (H) <18 ng/L    Comment: (NOTE) Elevated high sensitivity troponin I (hsTnI) values and significant  changes across serial measurements may suggest ACS but many other  chronic and acute conditions are  known to elevate hsTnI results.  Refer to the Links section for chest pain algorithms and additional  guidance. Performed at [facility], [address], [place], [place] [address]    DG Chest 2 View Result Date: 05/13/2023 CLINICAL DATA:  Cough.  Shortness of breath. EXAM: CHEST - 2 VIEW COMPARISON:  12/28/2021. FINDINGS: Low lung volume. There are probable atelectatic changes at the lung bases. Bilateral lung fields are otherwise clear. No acute consolidation or lung collapse. Bilateral costophrenic angles are clear. Normal cardio-mediastinal silhouette. No acute osseous abnormalities. The soft tissues are within normal limits. IMPRESSION: No active cardiopulmonary disease. Electronically Signed   By: Ree Molt M.D.   On: 05/13/2023 11:59    Pending Labs Unresulted Labs (From admission, onward)    None       Vitals/Pain Today's Vitals   05/13/23 1745 05/13/23 1800 05/13/23 1815 05/13/23 1830  BP: (!) 173/71 (!) 140/71  (!) 147/60  Pulse: 87 77 73   Resp: 18 (!) 0 14 (!) 7  Temp:      TempSrc:      SpO2: 98% 96% 96%   Weight:      Height:      PainSc:        Isolation Precautions No active isolations  Medications Medications  albuterol  (PROVENTIL ) (2.5 MG/3ML) 0.083% nebulizer solution 2.5 mg (has no administration in time range)  ipratropium-albuterol  (DUONEB) 0.5-2.5 (3) MG/3ML nebulizer solution 3 mL (3 mLs Nebulization Given 05/13/23 1028)  methylPREDNISolone  sodium succinate (SOLU-MEDROL ) 125 mg/2 mL injection 125 mg (125 mg Intravenous Given 05/13/23 1032)  ipratropium-albuterol  (DUONEB) 0.5-2.5 (3) MG/3ML nebulizer solution 3 mL (3 mLs Nebulization Given 05/13/23 1307)  ketorolac  (TORADOL ) 15 MG/ML injection 15 mg (15 mg Intravenous Given 05/13/23 1425)  acetaminophen  (TYLENOL ) tablet 650 mg (650 mg Oral Given 05/13/23 1426)  0.9 %  sodium chloride  infusion ( Intravenous New Bag/Given 05/13/23 1624)    Mobility walks     Focused Assessments Pulmonary  Assessment Handoff:  Lung sounds: Bilateral Breath Sounds: Diminished O2 Device: Nasal Cannula O2 Flow Rate (L/min): 2 L/min    R Recommendations: See Admitting Provider Note  Report given to:   Additional Notes:

## 2023-05-13 NOTE — ED Notes (Signed)
 Patient enroute with [facility]

## 2023-05-13 NOTE — ED Notes (Signed)
 Desat SpO2 85% while asleep, placed back on 2lpm [place]. EDP aware.

## 2023-05-13 NOTE — ED Triage Notes (Signed)
 The patient has had a cough for one week and shortness of breath with a sore throat.

## 2023-05-13 NOTE — ED Provider Notes (Addendum)
 [place] [facility] Provider Note   CSN: [id number] Arrival date & time: 05/13/23  0932     History  Chief Complaint  Patient presents with   Shortness of Breath   Cough    [name] [name] is a 73 y.o. male.  Patient is a 73 year old male with past medical history of diabetes, obstructive sleep apnea, obesity, and former smoker presenting for complaints of shortness of breath.  Patient admits to sore throat, productive cough, nasal congestion, sinus pressure, and shortness of breath that developed over the last 3 days.  He denies any sick contacts.  He denies any headaches or neck stiffness.  Presenting to emergency department for worsening shortness of breath.  Denies any chest pain.  The history is provided by the patient. No language interpreter was used.  Shortness of Breath Associated symptoms: cough   Associated symptoms: no abdominal pain, no chest pain, no ear pain, no fever, no rash, no sore throat and no vomiting   Cough Associated symptoms: shortness of breath   Associated symptoms: no chest pain, no chills, no ear pain, no fever, no rash and no sore throat        Home Medications Prior to Admission medications   Medication Sig Start Date End Date Taking? Authorizing Provider  amLODipine  (NORVASC ) 5 MG tablet Take 1 tablet (5 mg total) by mouth daily. 02/18/23   Plotnikov, Aleksei V, MD  B-D ULTRAFINE III SHORT PEN 31G X 8 MM MISC USE AS DIRECTED 12/29/17   Plotnikov, Karlynn GAILS, MD  chlorpheniramine-HYDROcodone  (TUSSIONEX) 10-8 MG/5ML Take 5 mLs by mouth every 12 (twelve) hours as needed for cough. 03/31/23   Plotnikov, Aleksei V, MD  Cholecalciferol (VITAMIN D ) 50 MCG (2000 UT) tablet Take 2,000 Units by mouth daily.    [provider]  clobetasol  (TEMOVATE ) 0.05 % external solution Apply 1 application. topically 2 (two) times daily. 08/26/21   Plotnikov, Aleksei V, MD  cloNIDine  (CATAPRES ) 0.1 MG tablet TAKE 1 TABLET BY  MOUTH THREE TIMES DAILY AS NEEDED. TAKE IF SYSTOLIC BLOOD PRESSURE>170 11/26/20   Plotnikov, Aleksei V, MD  doxylamine, Sleep, (UNISOM) 25 MG tablet Take 25 mg by mouth at bedtime as needed for sleep.    [provider]  empagliflozin  (JARDIANCE ) 10 MG TABS tablet Take 1 tablet (10 mg total) by mouth daily. 03/31/23   Plotnikov, Aleksei V, MD  furosemide  (LASIX ) 20 MG tablet Take 1 tablet (20 mg total) by mouth daily. Patient not taking: Reported on 03/11/2023 12/31/21   Plotnikov, Karlynn GAILS, MD  gabapentin  (NEURONTIN ) 600 MG tablet TAKE 1 TABLET BY MOUTH TWICE DAILY 03/02/23   Plotnikov, Aleksei V, MD  glimepiride  (AMARYL ) 2 MG tablet Take 2 mg by mouth daily.    [provider]  insulin  glargine, 1 Unit Dial , (TOUJEO ) 300 UNIT/ML Solostar Pen Inject 48 Units into the skin daily before breakfast.    [provider]  JARDIANCE  10 MG TABS tablet TAKE 1 TABLET BY MOUTH DAILY 03/19/22   Plotnikov, Aleksei V, MD  losartan  (COZAAR ) 100 MG tablet TAKE 1 TABLET BY MOUTH DAILY 04/08/22   Plotnikov, Aleksei V, MD  methocarbamol  (ROBAXIN ) 750 MG tablet TAKE ONE TABLET BY MOUTH THREE TIMES DAILY AS NEEDED FOR MUSCLE SPASMS /pain 07/22/22   Plotnikov, Aleksei V, MD  metoprolol  succinate (TOPROL -XL) 25 MG 24 hr tablet TAKE 1 TABLET BY MOUTH IN THE MORNING AND AT BEDTIME 09/24/22   Plotnikov, Aleksei V, MD  ondansetron  (ZOFRAN ) 4 MG  tablet Take 1 tablet (4 mg total) by mouth every 8 (eight) hours as needed for nausea or vomiting. Patient not taking: Reported on 03/11/2023 03/13/21   Gladis Cough, MD  rosuvastatin  (CRESTOR ) 40 MG tablet Take 40 mg by mouth at bedtime.    [provider]  Semaglutide ,0.25 or 0.5MG /DOS, (OZEMPIC , 0.25 OR 0.5 MG/DOSE,) 2 MG/3ML SOPN Inject 0.5 mg into the skin once a week. 06/02/22     tamsulosin  (FLOMAX ) 0.4 MG CAPS capsule TAKE ONE CAPSULE BY MOUTH DAILY AFTER SUPPER 03/01/23   Plotnikov, Aleksei V, MD  tirzepatide  (MOUNJARO ) 2.5 MG/0.5ML Pen Inject  2.5 mg into the skin once a week. 03/24/23     tirzepatide  (MOUNJARO ) 5 MG/0.5ML Pen Inject 5 mg into the skin once a week. 04/19/23     triamcinolone  ointment (KENALOG ) 0.5 % Apply 1 Application topically 4 (four) times daily as needed. 06/30/22 06/30/23  Plotnikov, Karlynn GAILS, MD      Allergies    Amoxicillin, Dilaudid  [hydromorphone ], Lisinopril , Simvastatin, and Penicillins    Review of Systems   Review of Systems  Constitutional:  Negative for chills and fever.  HENT:  Negative for ear pain and sore throat.   Eyes:  Negative for pain and visual disturbance.  Respiratory:  Positive for cough and shortness of breath.   Cardiovascular:  Negative for chest pain and palpitations.  Gastrointestinal:  Negative for abdominal pain and vomiting.  Genitourinary:  Negative for dysuria and hematuria.  Musculoskeletal:  Negative for arthralgias and back pain.  Skin:  Negative for color change and rash.  Neurological:  Negative for seizures and syncope.  All other systems reviewed and are negative.   Physical Exam Updated Vital Signs BP (!) 153/65   Pulse 90   Temp 100.1 F (37.8 C)   Resp 16   Ht 5' 9 (1.753 m)   Wt 113.9 kg   SpO2 98%   BMI 37.07 kg/m  Physical Exam Vitals and nursing note reviewed.  Constitutional:      General: He is not in acute distress.    Appearance: He is well-developed.  HENT:     Head: Normocephalic and atraumatic.  Eyes:     Conjunctiva/sclera: Conjunctivae normal.  Cardiovascular:     Rate and Rhythm: Normal rate and regular rhythm.     Heart sounds: No murmur heard. Pulmonary:     Effort: Pulmonary effort is normal. No respiratory distress.     Breath sounds: Examination of the right-upper field reveals wheezing. Examination of the left-upper field reveals wheezing. Examination of the right-middle field reveals wheezing. Examination of the left-middle field reveals wheezing. Examination of the right-lower field reveals wheezing. Examination of the  left-lower field reveals wheezing. Wheezing present.  Abdominal:     Palpations: Abdomen is soft.     Tenderness: There is no abdominal tenderness. Rebound: .now.  Musculoskeletal:        General: No swelling.     Cervical back: Neck supple.  Skin:    General: Skin is warm and dry.     Capillary Refill: Capillary refill takes less than 2 seconds.  Neurological:     Mental Status: He is alert.  Psychiatric:        Mood and Affect: Mood normal.     ED Results / Procedures / Treatments   Labs (all labs ordered are listed, but only abnormal results are displayed) Labs Reviewed  CBC WITH DIFFERENTIAL/PLATELET - Abnormal; Notable for the following components:      Result  Value   Hemoglobin 12.8 (*)    All other components within normal limits  COMPREHENSIVE METABOLIC PANEL - Abnormal; Notable for the following components:   Glucose, Bld 101 (*)    BUN 32 (*)    Creatinine, Ser 2.68 (*)    Calcium  8.3 (*)    Albumin 3.2 (*)    GFR, Estimated 24 (*)    All other components within normal limits  TROPONIN I (HIGH SENSITIVITY) - Abnormal; Notable for the following components:   Troponin I (High Sensitivity) 62 (*)    All other components within normal limits  TROPONIN I (HIGH SENSITIVITY) - Abnormal; Notable for the following components:   Troponin I (High Sensitivity) 58 (*)    All other components within normal limits  RESP PANEL BY RT-PCR (RSV, FLU A&B, COVID)  [id number]  GROUP A STREP BY PCR  MAGNESIUM    EKG EKG Interpretation Date/Time:  Friday May 13 2023 10:02:07 EST Ventricular Rate:  83 PR Interval:  138 QRS Duration:  163 QT Interval:  394 QTC Calculation: 463 R Axis:   43  Text Interpretation: Sinus rhythm Right bundle branch block ST elevation lead III only Confirmed by Elnor Hila ([id number]) on 05/13/2023 12:44:18 PM  Radiology DG Chest 2 View Result Date: 05/13/2023 CLINICAL DATA:  Cough.  Shortness of breath. EXAM: CHEST - 2 VIEW COMPARISON:  12/28/2021.  FINDINGS: Low lung volume. There are probable atelectatic changes at the lung bases. Bilateral lung fields are otherwise clear. No acute consolidation or lung collapse. Bilateral costophrenic angles are clear. Normal cardio-mediastinal silhouette. No acute osseous abnormalities. The soft tissues are within normal limits. IMPRESSION: No active cardiopulmonary disease. Electronically Signed   By: Ree Molt M.D.   On: 05/13/2023 11:59    Procedures .Critical Care  Performed by: Elnor Hila SQUIBB, DO Authorized by: Elnor Hila SQUIBB, DO   Critical care provider statement:    Critical care time (minutes):  79   Critical care was necessary to treat or prevent imminent or life-threatening deterioration of the following conditions:  Respiratory failure   Critical care was time spent personally by me on the following activities:  Development of treatment plan with patient or surrogate, discussions with consultants, evaluation of patient's response to treatment, examination of patient, ordering and review of laboratory studies, ordering and review of radiographic studies, ordering and performing treatments and interventions, pulse oximetry, re-evaluation of patient's condition and review of old charts   Care discussed with: admitting provider       Medications Ordered in ED Medications  albuterol  (VENTOLIN  HFA) 108 (90 Base) MCG/ACT inhaler 2 puff (has no administration in time range)  ketorolac  (TORADOL ) 15 MG/ML injection 15 mg (has no administration in time range)  acetaminophen  (TYLENOL ) tablet 650 mg (has no administration in time range)  ipratropium-albuterol  (DUONEB) 0.5-2.5 (3) MG/3ML nebulizer solution 3 mL (3 mLs Nebulization Given 05/13/23 1028)  methylPREDNISolone  sodium succinate (SOLU-MEDROL ) 125 mg/2 mL injection 125 mg (125 mg Intravenous Given 05/13/23 1032)  ipratropium-albuterol  (DUONEB) 0.5-2.5 (3) MG/3ML nebulizer solution 3 mL (3 mLs Nebulization Given 05/13/23 1307)    ED Course/  Medical Decision Making/ A&P Clinical Course as of 05/13/23 1415  Fri May 13, 2023  1016 EKG reading for acute MI with ST segment elevation in lead III only.  No elevation in lead II or aVF.SABRA  Patient symptoms do not fit an acute MI at this time with presenting symptoms for cough, sore throat, wheezing, nasal congestion.  No chest pain.  Will get troponins and repeat EKG if needed. [AG]    Clinical Course User Index [AG] Elnor Bernarda SQUIBB, DO                                 Medical Decision Making Amount and/or Complexity of Data Reviewed Labs: ordered. Radiology: ordered.  Risk OTC drugs. Prescription drug management. Decision regarding hospitalization.   3:35 PM 73 year old male with past medical history of diabetes, obstructive sleep apnea, obesity, and former smoker presenting for complaints of shortness of breath.  Patient is alert and oriented x 3, no acute distress, fever of 100.1 F, with tachypnea and hypoxia.  Lung exam demonstrates wheezing in all lung fields with diminished breath sounds.  2 L nasal cannula placed.  After DuoNeb's treatment patient begins bringing up large amounts of productive green sputum.  Chest x-ray demonstrates no focal pneumonias.  COVID, flu, RSV negative.  Laboratory studies demonstrate no leukocytosis or other signs of sepsis at this time.  AKI present with a creatinine of 2.68 elevated from patient's baseline of 1.54.  Likely secondary to dehydration.  Patient admits to decreased p.o. intake since getting sick.  Reevaluation of patient after 2 DuoNeb treatments and Solu-Medrol .  Wheezing is improved.  He admits to significant improvement of symptoms however he is still hypoxic when oxygen is removed saturating at 89% on room air.  Will continue 2 L nasal cannula.  Amending admission for acute respiratory failure with hypoxia secondary to viral syndrome with cough and/or COPD exacerbation.  Also has elevated troponins likely secondary to ischemic demand  from hypoxia.  No chest pain and stable EKG.  Repeat troponin very similar to initial.  Patient accepted by Dr. Marolyn.         Final Clinical Impression(s) / ED Diagnoses Final diagnoses:  Upper respiratory tract infection, unspecified type  Hypoxia  Elevated troponin-demand ischemia  COPD exacerbation (HCC)  AKI (acute kidney injury) [facility])    Rx / DC Orders ED Discharge Orders     None         Elnor Bernarda SQUIBB, DO 05/13/23 1440    Elnor Bernarda P, DO 05/13/23 1443

## 2023-05-13 NOTE — ED Notes (Signed)
 Fall risk armband Fall risk sign on door Patient wearing shoes

## 2023-05-14 ENCOUNTER — Other Ambulatory Visit (HOSPITAL_COMMUNITY): Payer: Self-pay

## 2023-05-14 DIAGNOSIS — Z794 Long term (current) use of insulin: Secondary | ICD-10-CM

## 2023-05-14 DIAGNOSIS — R9431 Abnormal electrocardiogram [ECG] [EKG]: Secondary | ICD-10-CM

## 2023-05-14 DIAGNOSIS — N179 Acute kidney failure, unspecified: Secondary | ICD-10-CM

## 2023-05-14 DIAGNOSIS — J9601 Acute respiratory failure with hypoxia: Secondary | ICD-10-CM

## 2023-05-14 DIAGNOSIS — R739 Hyperglycemia, unspecified: Secondary | ICD-10-CM

## 2023-05-14 DIAGNOSIS — E1165 Type 2 diabetes mellitus with hyperglycemia: Secondary | ICD-10-CM

## 2023-05-14 DIAGNOSIS — T380X5A Adverse effect of glucocorticoids and synthetic analogues, initial encounter: Secondary | ICD-10-CM

## 2023-05-14 DIAGNOSIS — J441 Chronic obstructive pulmonary disease with (acute) exacerbation: Secondary | ICD-10-CM

## 2023-05-14 LAB — RESPIRATORY PANEL BY PCR

## 2023-05-14 LAB — BASIC METABOLIC PANEL
Anion gap: 7 (ref 5–15)
BUN: 37 mg/dL — ABNORMAL HIGH (ref 8–23)
CO2: 28 mmol/L (ref 22–32)
Calcium: 8 mg/dL — ABNORMAL LOW (ref 8.9–10.3)
Chloride: 105 mmol/L (ref 98–111)
Creatinine, Ser: 2.24 mg/dL — ABNORMAL HIGH (ref 0.61–1.24)
GFR, Estimated: 30 mL/min — ABNORMAL LOW (ref >60)
Glucose, Bld: 215 mg/dL — ABNORMAL HIGH (ref 70–99)
Potassium: 4.3 mmol/L (ref 3.5–5.1)
Sodium: 140 mmol/L (ref 135–145)

## 2023-05-14 LAB — CBC
HCT: 38.2 % — ABNORMAL LOW (ref 39.0–52.0)
Hemoglobin: 12.4 g/dL — ABNORMAL LOW (ref 13.0–17.0)
MCH: 27.3 pg (ref 26.0–34.0)
MCHC: 32.5 g/dL (ref 30.0–36.0)
MCV: 84 fL (ref 80.0–100.0)
Platelets: 200 10*3/uL (ref 150–400)
RBC: 4.55 MIL/uL (ref 4.22–5.81)
RDW: 14.3 % (ref 11.5–15.5)
WBC: 8.8 10*3/uL (ref 4.0–10.5)
nRBC: 0 % (ref 0.0–0.2)

## 2023-05-14 LAB — GLUCOSE, CAPILLARY
Glucose-Capillary: 309 mg/dL — ABNORMAL HIGH (ref 70–99)
Glucose-Capillary: 213 mg/dL — ABNORMAL HIGH (ref 70–99)

## 2023-05-14 MED ORDER — PREDNISONE 10 MG PO TABS: 30.0000 mg | ORAL_TABLET | Freq: Every day | ORAL | 0 refills | Status: DC

## 2023-05-14 MED ORDER — INSULIN GLARGINE (2 UNIT DIAL) 300 UNIT/ML ~~LOC~~ SOPN: 60.0000 [IU] | PEN_INJECTOR | Freq: Every morning | SUBCUTANEOUS | Status: DC

## 2023-05-14 MED ORDER — HYDRALAZINE HCL 20 MG/ML IJ SOLN
5.0000 mg | Freq: Four times a day (QID) | INTRAMUSCULAR | Status: DC | PRN
  Administered 2023-05-14: 5 mg via INTRAVENOUS
  Filled 2023-05-14: qty 1

## 2023-05-14 MED ORDER — METOPROLOL SUCCINATE ER 25 MG PO TB24
25.0000 mg | ORAL_TABLET | Freq: Every day | ORAL | Status: DC
  Administered 2023-05-14: 25 mg via ORAL
  Filled 2023-05-14: qty 1

## 2023-05-14 MED ORDER — AZITHROMYCIN 250 MG PO TABS
250.0000 mg | ORAL_TABLET | Freq: Every day | ORAL | 0 refills | Status: DC
  Filled 2023-05-14: qty 3, 3d supply, fill #0

## 2023-05-14 MED ORDER — AZITHROMYCIN 500 MG IV SOLR
500.0000 mg | INTRAVENOUS | Status: AC
  Administered 2023-05-14: 500 mg via INTRAVENOUS
  Filled 2023-05-14: qty 5

## 2023-05-14 MED ORDER — BUDESONIDE 0.25 MG/2ML IN SUSP
0.2500 mg | Freq: Two times a day (BID) | RESPIRATORY_TRACT | Status: DC
  Administered 2023-05-14: 0.25 mg via RESPIRATORY_TRACT
  Filled 2023-05-14: qty 2

## 2023-05-14 MED ORDER — ALBUTEROL SULFATE HFA 108 (90 BASE) MCG/ACT IN AERS: 2.0000 | INHALATION_SPRAY | Freq: Four times a day (QID) | RESPIRATORY_TRACT | 0 refills | Status: DC | PRN

## 2023-05-14 MED ORDER — DM-GUAIFENESIN ER 30-600 MG PO TB12
1.0000 | ORAL_TABLET | Freq: Two times a day (BID) | ORAL | Status: DC
  Administered 2023-05-14 (×2): 1 via ORAL
  Filled 2023-05-14 (×2): qty 1

## 2023-05-14 MED ORDER — AZITHROMYCIN 250 MG PO TABS: 500.0000 mg | ORAL_TABLET | Freq: Every day | ORAL | Status: DC

## 2023-05-14 MED ORDER — ALBUTEROL SULFATE HFA 108 (90 BASE) MCG/ACT IN AERS
2.0000 | INHALATION_SPRAY | Freq: Four times a day (QID) | RESPIRATORY_TRACT | 0 refills | Status: DC | PRN
Start: 2023-05-14 — End: 2023-05-20
  Filled 2023-05-14: qty 18, 30d supply, fill #0

## 2023-05-14 MED ORDER — METHYLPREDNISOLONE SODIUM SUCC 40 MG IJ SOLR
40.0000 mg | Freq: Two times a day (BID) | INTRAMUSCULAR | Status: DC
  Administered 2023-05-14: 40 mg via INTRAVENOUS
  Filled 2023-05-14: qty 1

## 2023-05-14 MED ORDER — INSULIN ASPART 100 UNIT/ML IJ SOLN
0.0000 [IU] | INTRAMUSCULAR | Status: DC
  Administered 2023-05-14: 15 [IU] via SUBCUTANEOUS
  Administered 2023-05-14: 7 [IU] via SUBCUTANEOUS

## 2023-05-14 MED ORDER — AZITHROMYCIN 250 MG PO TABS: 250.0000 mg | ORAL_TABLET | Freq: Every day | ORAL | 0 refills | Status: DC

## 2023-05-14 MED ORDER — ROSUVASTATIN CALCIUM 20 MG PO TABS: 40.0000 mg | ORAL_TABLET | Freq: Every day | ORAL | Status: DC

## 2023-05-14 MED ORDER — ACETAMINOPHEN 650 MG RE SUPP: 650.0000 mg | Freq: Four times a day (QID) | RECTAL | Status: DC | PRN

## 2023-05-14 MED ORDER — AMLODIPINE BESYLATE 5 MG PO TABS
5.0000 mg | ORAL_TABLET | Freq: Every day | ORAL | Status: DC
  Administered 2023-05-14: 5 mg via ORAL
  Filled 2023-05-14: qty 1

## 2023-05-14 MED ORDER — ACETAMINOPHEN 325 MG PO TABS: 650.0000 mg | ORAL_TABLET | Freq: Four times a day (QID) | ORAL | Status: DC | PRN

## 2023-05-14 MED ORDER — INSULIN GLARGINE-YFGN 100 UNIT/ML ~~LOC~~ SOLN
60.0000 [IU] | Freq: Every day | SUBCUTANEOUS | Status: DC
  Administered 2023-05-14: 60 [IU] via SUBCUTANEOUS
  Filled 2023-05-14: qty 0.6

## 2023-05-14 MED ORDER — SODIUM CHLORIDE 0.9 % IV SOLN
INTRAVENOUS | Status: DC
Start: 2023-05-14 — End: 2023-05-14

## 2023-05-14 MED ORDER — IPRATROPIUM-ALBUTEROL 0.5-2.5 (3) MG/3ML IN SOLN: 3.0000 mL | Freq: Four times a day (QID) | RESPIRATORY_TRACT | Status: DC

## 2023-05-14 MED ORDER — PREDNISONE 10 MG PO TABS
30.0000 mg | ORAL_TABLET | Freq: Every day | ORAL | 0 refills | Status: AC
Start: 2023-05-14 — End: 2023-05-17
  Filled 2023-05-14: qty 9, 3d supply, fill #0

## 2023-05-14 MED ORDER — ENOXAPARIN SODIUM 40 MG/0.4ML IJ SOSY
40.0000 mg | PREFILLED_SYRINGE | INTRAMUSCULAR | Status: DC
  Administered 2023-05-14: 40 mg via SUBCUTANEOUS
  Filled 2023-05-14: qty 0.4

## 2023-05-14 MED ORDER — IPRATROPIUM-ALBUTEROL 0.5-2.5 (3) MG/3ML IN SOLN
3.0000 mL | Freq: Two times a day (BID) | RESPIRATORY_TRACT | Status: DC
  Administered 2023-05-14: 3 mL via RESPIRATORY_TRACT
  Filled 2023-05-14: qty 3

## 2023-05-14 MED ORDER — TAMSULOSIN HCL 0.4 MG PO CAPS: 0.4000 mg | ORAL_CAPSULE | Freq: Every day | ORAL | Status: DC

## 2023-05-14 MED ORDER — GABAPENTIN 300 MG PO CAPS
600.0000 mg | ORAL_CAPSULE | Freq: Two times a day (BID) | ORAL | Status: DC
  Administered 2023-05-14: 600 mg via ORAL
  Filled 2023-05-14: qty 2

## 2023-05-14 NOTE — H&P (Signed)
 History and Physical    Savoy Somerville FMW:[id number] DOB: Sep 23, 1950 DOA: 05/13/2023  PCP: Garald Karlynn GAILS, MD  Patient coming from: [facility] EMERGENCY DEPARTMENT AT [facility]   Chief Complaint: Shortness of breath  HPI: [name] [name] is a 73 y.o. male with medical history significant of hypertension, hyperlipidemia, type 2 diabetes, obesity, CKD stage IIIa, OSA on CPAP, former tobacco abuse, alcohol use presented ED with shortness of breath, cough, sore throat, and wheezing.  Oxygen saturation in the upper 80s on room air, placed on 2 L [place].  Temperature 100.1 F.  Not tachycardic or hypotensive.  Labs showing no leukocytosis, hemoglobin 12.8 (previously in the 13-14 range on labs over 2 years ago), MCV 83.2, glucose 101, BUN 32, creatinine 2.7 (previously around 1.5 on labs over 2 years ago), COVID/influenza/RSV PCR negative, group A strep PCR negative, troponin 62> 58.  EKG showing new ST elevations in lead III only but not in leads II or aVF.  Chest x-ray showing no active cardiopulmonary disease. Patient was given Tylenol , DuoNeb x 2, Toradol , Solu-Medrol  125 mg, and albuterol  neb in the ED.  Patient states he has been feeling ill for the past 1 week.  He is having shortness breath, wheezing, coughing up large amounts of white sputum, and having sore throat.  He is not aware of having fevers at home.  Denies history of asthma or COPD and does not use any inhalers at home.  He is no longer smoking cigarettes but used to smoke in the past.  He drinks 2 glasses of wine a few times a week and denies history of alcohol withdrawal in the past.  Denies chest pain, abdominal pain, nausea, vomiting, or diarrhea.  Review of Systems:  Review of Systems  All other systems reviewed and are negative.   Past Medical History:  Diagnosis Date   Arthritis    knees   Bursitis of left shoulder    Cancer (HCC)    skin   Cervical disc disease    Chronic kidney disease    stage 3    Diabetes mellitus, type 2 (HCC)    Diverticulosis of colon    Erectile dysfunction    Exogenous obesity    History of anemia    History of kidney stones    Hyperlipidemia    Hypertension    Internal hemorrhoids    Internal hemorrhoids with complication - fecal smearing 09/11/2018   Neuromuscular disorder (HCC)    neuropathy in feet   OSA (obstructive sleep apnea)    cpap   Testicular hypofunction     Past Surgical History:  Procedure Laterality Date   ANTERIOR CERVICAL DECOMP/DISCECTOMY FUSION  2008   some limit to ROM   COLONOSCOPY     POSTERIOR LAMINECTOMY / DECOMPRESSION CERVICAL SPINE  2009   c5-6 fusion  Dr. Armen. with nerve damage to fingers Rt arm   UMBILICAL HERNIA REPAIR N/A 03/13/2021   Procedure: LAPAROSCOPIC UMBILICAL HERNIA REPAIR;  Surgeon: Gladis Cough, MD;  Location: [facility];  Service: General;  Laterality: N/A;     reports that he quit smoking about 42 years ago. His smoking use included cigarettes. He has never used smokeless tobacco. He reports current alcohol use of about 7.0 standard drinks of alcohol per week. He reports that he does not use drugs.  Allergies  Allergen Reactions   Amoxicillin Nausea And Vomiting   Dilaudid  [Hydromorphone ] Nausea And Vomiting   Lisinopril  Cough        Simvastatin  Other (See Comments)    myalgia   Penicillins Rash    Family History  Problem Relation Age of Onset   Heart disease Father        Died of MI at age 43   Breast cancer Mother    Diabetes Paternal Grandmother    Healthy Son    Colon cancer Neg Hx     Prior to Admission medications   Medication Sig Start Date End Date Taking? Authorizing Provider  amLODipine  (NORVASC ) 5 MG tablet Take 1 tablet (5 mg total) by mouth daily. 02/18/23  Yes Plotnikov, Aleksei V, MD  B-D ULTRAFINE III SHORT PEN 31G X 8 MM MISC USE AS DIRECTED 12/29/17  Yes Plotnikov, Aleksei V, MD  chlorpheniramine-HYDROcodone  (TUSSIONEX) 10-8 MG/5ML Take 5 mLs by mouth every 12 (twelve)  hours as needed for cough. 03/31/23  Yes Plotnikov, Aleksei V, MD  Cholecalciferol (VITAMIN D ) 50 MCG (2000 UT) tablet Take 2,000 Units by mouth daily.   Yes [provider]  clobetasol  (TEMOVATE ) 0.05 % external solution Apply 1 application. topically 2 (two) times daily. 08/26/21  Yes Plotnikov, Aleksei V, MD  clobetasol  cream (TEMOVATE ) 0.05 % Apply 1 Application topically 2 (two) times daily as needed (dry skin). 04/11/23  Yes [provider]  cloNIDine  (CATAPRES ) 0.1 MG tablet TAKE 1 TABLET BY MOUTH THREE TIMES DAILY AS NEEDED. TAKE IF SYSTOLIC BLOOD PRESSURE>170 Patient taking differently: Take 0.1 mg by mouth as needed (take if systolic BP is > 829). 11/26/20  Yes Plotnikov, Aleksei V, MD  empagliflozin  (JARDIANCE ) 10 MG TABS tablet Take 1 tablet (10 mg total) by mouth daily. 03/31/23  Yes Plotnikov, Karlynn GAILS, MD  gabapentin  (NEURONTIN ) 600 MG tablet TAKE 1 TABLET BY MOUTH TWICE DAILY 03/02/23  Yes Plotnikov, Aleksei V, MD  glimepiride  (AMARYL ) 2 MG tablet Take 2 mg by mouth daily.   Yes [provider]  losartan  (COZAAR ) 100 MG tablet TAKE 1 TABLET BY MOUTH DAILY 04/08/22  Yes Plotnikov, Aleksei V, MD  metoprolol  succinate (TOPROL -XL) 25 MG 24 hr tablet TAKE 1 TABLET BY MOUTH IN THE MORNING AND AT BEDTIME 09/24/22  Yes Plotnikov, Aleksei V, MD  ondansetron  (ZOFRAN ) 4 MG tablet Take 1 tablet (4 mg total) by mouth every 8 (eight) hours as needed for nausea or vomiting. Patient taking differently: Take 4 mg by mouth as needed for nausea or vomiting. 03/13/21  Yes Gladis Cough, MD  rosuvastatin  (CRESTOR ) 40 MG tablet Take 40 mg by mouth at bedtime.   Yes [provider]  tamsulosin  (FLOMAX ) 0.4 MG CAPS capsule TAKE ONE CAPSULE BY MOUTH DAILY AFTER SUPPER 03/01/23  Yes Plotnikov, Aleksei V, MD  tirzepatide  (MOUNJARO ) 5 MG/0.5ML Pen Inject 5 mg into the skin once a week. Patient taking differently: Inject 5 mg into the skin once a week. Wednesdays 04/19/23  Yes    TOUJEO  MAX SOLOSTAR 300 UNIT/ML Solostar Pen Inject 60 Units into the skin every morning. 05/02/23  Yes [provider]  triamcinolone  ointment (KENALOG ) 0.5 % Apply 1 Application topically 4 (four) times daily as needed. 06/30/22 06/30/23 Yes Plotnikov, Karlynn GAILS, MD  furosemide  (LASIX ) 20 MG tablet Take 1 tablet (20 mg total) by mouth daily. Patient not taking: Reported on 03/11/2023 12/31/21   Plotnikov, Aleksei V, MD  JARDIANCE  10 MG TABS tablet TAKE 1 TABLET BY MOUTH DAILY Patient not taking: Reported on 05/13/2023 03/19/22   Plotnikov, Aleksei V, MD  methocarbamol  (ROBAXIN ) 750 MG tablet TAKE ONE TABLET BY MOUTH THREE TIMES DAILY  AS NEEDED FOR MUSCLE SPASMS /pain Patient not taking: Reported on 05/13/2023 07/22/22   Plotnikov, Karlynn GAILS, MD  Semaglutide ,0.25 or 0.5MG /DOS, (OZEMPIC , 0.25 OR 0.5 MG/DOSE,) 2 MG/3ML SOPN Inject 0.5 mg into the skin once a week. Patient not taking: Reported on 05/13/2023 06/02/22     tirzepatide  (MOUNJARO ) 2.5 MG/0.5ML Pen Inject 2.5 mg into the skin once a week. Patient not taking: Reported on 05/13/2023 03/24/23       Physical Exam: Vitals:   05/13/23 1907 05/13/23 2000 05/13/23 2005 05/13/23 2242  BP:  (!) 153/58  (!) 164/66  Pulse:  87  82  Resp:  13  20  Temp:   99 F (37.2 C) 97.9 F (36.6 C)  TempSrc:   Oral Oral  SpO2: 97% 95%  97%  Weight:      Height:        Physical Exam Vitals reviewed.  Constitutional:      General: He is not in acute distress. HENT:     Head: Normocephalic and atraumatic.  Eyes:     Extraocular Movements: Extraocular movements intact.  Cardiovascular:     Rate and Rhythm: Normal rate and regular rhythm.     Pulses: Normal pulses.  Pulmonary:     Effort: Pulmonary effort is normal. No respiratory distress.     Breath sounds: Wheezing present.     Comments: Diminished breath sounds with diffuse end expiratory wheezing Abdominal:     General: Bowel sounds are normal.     Palpations: Abdomen is soft.      Tenderness: There is no abdominal tenderness. There is no guarding.  Musculoskeletal:     Cervical back: Normal range of motion.     Right lower leg: No edema.     Left lower leg: No edema.  Skin:    General: Skin is warm and dry.  Neurological:     General: No focal deficit present.     Mental Status: He is alert and oriented to person, place, and time.     Labs on Admission: I have personally reviewed following labs and imaging studies  CBC: Recent Labs  Lab 05/13/23 0958  WBC 7.7  NEUTROABS 5.5  HGB 12.8*  HCT 39.6  MCV 83.2  PLT 204   Basic Metabolic Panel: Recent Labs  Lab 05/13/23 0958 05/13/23 1017  NA 137  --   K 4.3  --   CL 101  --   CO2 26  --   GLUCOSE 101*  --   BUN 32*  --   CREATININE 2.68*  --   CALCIUM  8.3*  --   MG  --  2.0   GFR: Estimated Creatinine Clearance: 31 mL/min (A) (by C-G formula based on SCr of 2.68 mg/dL (H)). Liver Function Tests: Recent Labs  Lab 05/13/23 0958  AST 27  ALT 21  ALKPHOS 61  BILITOT 0.4  PROT 7.1  ALBUMIN 3.2*   No results for input(s): LIPASE, AMYLASE in the last 168 hours. No results for input(s): AMMONIA in the last 168 hours. Coagulation Profile: No results for input(s): INR, PROTIME in the last 168 hours. Cardiac Enzymes: No results for input(s): CKTOTAL, CKMB, CKMBINDEX, TROPONINI in the last 168 hours. BNP (last 3 results) No results for input(s): PROBNP in the last 8760 hours. HbA1C: No results for input(s): HGBA1C in the last 72 hours. CBG: Recent Labs  Lab 05/13/23 2243  GLUCAP 372*   Lipid Profile: No results for input(s): CHOL, HDL, LDLCALC, TRIG, CHOLHDL, LDLDIRECT  in the last 72 hours. Thyroid  Function Tests: No results for input(s): TSH, T4TOTAL, FREET4, T3FREE, THYROIDAB in the last 72 hours. Anemia Panel: No results for input(s): VITAMINB12, FOLATE, FERRITIN, TIBC, IRON, RETICCTPCT in the last 72 hours. Urine analysis:     Component Value Date/Time   COLORURINE YELLOW 02/19/2019 1833   APPEARANCEUR CLEAR 02/19/2019 1833   LABSPEC 1.025 02/19/2019 1833   PHURINE 5.0 02/19/2019 1833   GLUCOSEU >=500 (A) 02/19/2019 1833   GLUCOSEU >=1000 (A) 02/17/2016 0754   HGBUR MODERATE (A) 02/19/2019 1833   HGBUR negative 04/09/2008 1010   BILIRUBINUR NEGATIVE 02/19/2019 1833   KETONESUR 5 (A) 02/19/2019 1833   PROTEINUR >=300 (A) 02/19/2019 1833   UROBILINOGEN 0.2 02/17/2016 0754   NITRITE NEGATIVE 02/19/2019 1833   LEUKOCYTESUR NEGATIVE 02/19/2019 1833    Radiological Exams on Admission: DG Chest 2 View Result Date: 05/13/2023 CLINICAL DATA:  Cough.  Shortness of breath. EXAM: CHEST - 2 VIEW COMPARISON:  12/28/2021. FINDINGS: Low lung volume. There are probable atelectatic changes at the lung bases. Bilateral lung fields are otherwise clear. No acute consolidation or lung collapse. Bilateral costophrenic angles are clear. Normal cardio-mediastinal silhouette. No acute osseous abnormalities. The soft tissues are within normal limits. IMPRESSION: No active cardiopulmonary disease. Electronically Signed   By: Ree Molt M.D.   On: 05/13/2023 11:59    EKG: Independently reviewed.  Sinus rhythm, RBBB is not new.  ST elevations in lead III appear new compared to previous EKG from October 2022.  No ST elevations in lead II or aVF.  Assessment and Plan  Acute hypoxemic respiratory failure secondary to suspected acute COPD exacerbation Patient is a former smoker.  Presenting with complaints of shortness of breath, cough productive of large amounts of sputum, sore throat, and wheezing.  Temperature 100.1 F.  Not tachycardic or hypotensive.  No leukocytosis on labs.  COVID/influenza/RSV PCR negative.  Group A strep PCR negative.  Chest x-ray showing no active cardiopulmonary disease.  Oxygen saturation in the upper 80s on room air.  Patient was given Solu-Medrol  and bronchodilator treatments in the ED.  Currently stable on 2  L [place] but continues to have wheezing.  Continue treatment with Solu-Medrol  40 mg every 12 hours, DuoNeb every 6 hours, Pulmicort  neb twice daily, albuterol  neb every 4 hours as needed, Mucinex -DM, and flutter valve.  Start 5-day course of azithromycin . Respiratory viral panel ordered.  Continue supplemental oxygen, wean as tolerated.  AKI on CKD stage IIIa Likely prerenal from dehydration/poor p.o. intake in the setting of acute illness. BUN 32, creatinine 2.7 (previously around 1.5 on labs over 2 years ago).  Continue IV fluid hydration and monitor renal function.  Avoid nephrotoxic agents/hold home losartan  and Lasix .  Abnormal EKG EKG showing new ST elevations in lead III only but not in leads II or aVF. Troponin 62> 58.  ACS less likely as patient is not endorsing chest pain.  Mild normocytic anemia Hemoglobin 12.8 (previously in the 13-14 range on labs over 2 years ago).  No signs or symptoms of bleeding.  Continue to monitor labs.  Type 2 diabetes Steroid-induced hyperglycemia Glucose 101 on initial labs but increased to 370s after patient was given IV steroids.  Last A1c 7.2 in October 2022, repeat ordered.  Placed on resistant sliding scale insulin  every 4 hours for now.  Continue current dose of home long-acting insulin  (Semglee  60 units daily).  Hypertension Continue amlodipine  and metoprolol .  Hold losartan  and Lasix  in the setting of acute kidney injury.  IV hydralazine  PRN SBP >160.  Hyperlipidemia Continue Crestor .  OSA Continue nightly CPAP.  Alcohol use Patient reports drinking 2 glasses of wine a few times a week.  No signs of withdrawal at this time.  Placed on CIWA monitoring.  Chronic pain/peripheral neuropathy Continue gabapentin .  BPH Continue Flomax .  DVT prophylaxis: Lovenox  Code Status: DNR (discussed with the patient) Family Communication: No family available at this time. Level of care: Telemetry bed Admission status: It is my clinical opinion that  referral for OBSERVATION is reasonable and necessary in this patient based on the above information provided. The aforementioned taken together are felt to place the patient at high risk for further clinical deterioration. However, it is anticipated that the patient may be medically stable for discharge from the hospital within 24 to 48 hours.  Editha Ram MD [facility]  If 7PM-7AM, please contact night-coverage [email or web address]  05/14/2023, 1:40 AM

## 2023-05-14 NOTE — Hospital Course (Addendum)
 Marland Kitchen

## 2023-05-14 NOTE — Discharge Summary (Signed)
 Physician Discharge Summary  [name] [name] FMW:[id number] DOB: [birth year] DOA: 05/13/2023  PCP: Garald Karlynn GAILS, MD  Admit date: 05/13/2023 Discharge date: 05/14/2023  Admitted From: Home  Discharge disposition: Home   Recommendations for Outpatient Follow-Up:   Follow up with your primary care provider in one week.  Check CBC, BMP, magnesium in the next visit   Discharge Diagnosis:   Active Problems:   DM2 (diabetes mellitus, type 2) (HCC)   Normocytic anemia   Acute hypoxemic respiratory failure (HCC)   AKI (acute kidney injury) (HCC)   Abnormal EKG   Steroid-induced hyperglycemia Parainfluenza infection   Discharge Condition: Improved.  Diet recommendation: Low sodium, heart healthy.  Carbohydrate-modified.   Wound care: None.  Code status: Full.   History of Present Illness:   [name] [name] is a 73 y.o. male with medical history significant of hypertension, hyperlipidemia, type 2 diabetes, obesity, CKD stage IIIa, OSA on CPAP, former tobacco abuse, alcohol use presented to hospital with shortness of breath cough wheezing and sore throat.  He admitted not using inhalers at home.  He was noted to have pulse ox of upper 80s in room air and was put on 2 L of oxygen.  Was noted to be febrile.  Labs showed leukocytosis with elevated creatinine at 2.7 from previous baseline around 1.5.  COVID/influenza/RSV PCR negative, group A strep PCR negative, troponin 62> 58.  EKG showing new ST elevations in lead III only but not in leads II or aVF.  Chest x-ray without obvious infiltrate.  Patient was given Tylenol , DuoNeb x 2, Toradol , Solu-Medrol  125 mg, and albuterol  neb in the ED and was considered for admission to hospital for further evaluation and treatment.   Hospital Course:   Following conditions were addressed during hospitalization as listed below,  Acute hypoxemic respiratory failure secondary to Parainfluenza infection Respiratory viral panel was positive for  parainfluenza.  Patient received Solu-Medrol  DuoNebs Pulmicort  Mucinex  flutter valve.  Currently on room air and feels significantly improved.  Pulse ox 96% on room air.  Will continue azithromycin  for 3 days, p.o. prednisone  and albuterol  inhaler on discharge..  Recommend follow-up with PCP as outpatient.  AKI on CKD stage IIIa Likely prerenal from dehydration/poor p.o. intake in the setting of acute illness. BUN 32, creatinine 2.7 (previously around 1.5 on labs over 2 years ago).  Hold losartan  and Lasix  on discharge for few days..  Encourage oral hydration on discharge.  Check blood work in few days.  Communicated with the patient and partner.  Abnormal EKG No chest pain.  Troponin mildly elevated but flat.  Acute coronary syndrome rule out.   Mild normocytic anemia Will continue to monitor.  Latest hemoglobin of 12.8.  Type 2 diabetes Steroid-induced hyperglycemia Review of latest hemoglobin A1c at 7.2 in October 2022,  Repeat A1c pending.  Patient follows up with endocrinology and will resume home insulin  regimen on discharge.  Hypertension Continue amlodipine  and metoprolol .  Continue to hold losartan  for next 2 days.  Hyperlipidemia Continue Crestor .   OSA with obesity. Body mass index is 37.07 kg/m.  Would benefit from weight loss as outpatient. Continue nightly CPAP.   Alcohol use Patient reports drinking 2 glasses of wine a few times a week.  No signs of withdrawal.  Closely monitor.  Discouraged drinking.   Chronic pain/peripheral neuropathy Continue gabapentin .   BPH Continue Flomax .  Disposition.  At this time, patient is stable for disposition home with outpatient PCP follow-up.  Medical Consultants:   None.  Procedures:  None Subjective:   Today, patient was seen and examined at bedside.  Denies any fever, chills or rigor.  Feels significantly improved.  Was able to ambulate without any issues.  Off oxygen.  Patient wants to go home.  Discharge Exam:    Vitals:   05/14/23 0650 05/14/23 0830  BP: (!) 145/67 (!) 159/79  Pulse: 73 73  Resp: 20   Temp: 98 F (36.7 C)   SpO2: 97%    Vitals:   05/14/23 0250 05/14/23 0335 05/14/23 0650 05/14/23 0830  BP: (!) 173/73  (!) 145/67 (!) 159/79  Pulse: 73  73 73  Resp: (!) 30 11 20    Temp: 98 F (36.7 C)  98 F (36.7 C)   TempSrc: Oral  Oral   SpO2: 98%  97%   Weight:      Height:       Body mass index is 37.07 kg/m.   General: Alert awake, not in obvious distress, obese built, on room air HENT: pupils equally reacting to light,  No scleral pallor or icterus noted. Oral mucosa is moist.  Chest:    Diminished breath sounds bilaterally. No crackles or wheezes.  CVS: S1 &S2 heard. No murmur.  Regular rate and rhythm. Abdomen: Soft, nontender, nondistended.  Bowel sounds are heard.   Extremities: No cyanosis, clubbing or edema.  Peripheral pulses are palpable. Psych: Alert, awake and oriented, normal mood CNS:  No cranial nerve deficits.  Power equal in all extremities.   Skin: Warm and dry.  No rashes noted.  The results of significant diagnostics from this hospitalization (including imaging, microbiology, ancillary and laboratory) are listed below for reference.     Diagnostic Studies:   DG Chest 2 View Result Date: 05/13/2023 CLINICAL DATA:  Cough.  Shortness of breath. EXAM: CHEST - 2 VIEW COMPARISON:  12/28/2021. FINDINGS: Low lung volume. There are probable atelectatic changes at the lung bases. Bilateral lung fields are otherwise clear. No acute consolidation or lung collapse. Bilateral costophrenic angles are clear. Normal cardio-mediastinal silhouette. No acute osseous abnormalities. The soft tissues are within normal limits. IMPRESSION: No active cardiopulmonary disease. Electronically Signed   By: Ree Molt M.D.   On: 05/13/2023 11:59     Labs:   Basic Metabolic Panel: Recent Labs  Lab 05/13/23 0958 05/13/23 1017 05/14/23 0747  NA 137  --  140  K 4.3  --  4.3   CL 101  --  105  CO2 26  --  28  GLUCOSE 101*  --  215*  BUN 32*  --  37*  CREATININE 2.68*  --  2.24*  CALCIUM  8.3*  --  8.0*  MG  --  2.0  --    GFR Estimated Creatinine Clearance: 37.1 mL/min (A) (by C-G formula based on SCr of 2.24 mg/dL (H)). Liver Function Tests: Recent Labs  Lab 05/13/23 0958  AST 27  ALT 21  ALKPHOS 61  BILITOT 0.4  PROT 7.1  ALBUMIN 3.2*   No results for input(s): LIPASE, AMYLASE in the last 168 hours. No results for input(s): AMMONIA in the last 168 hours. Coagulation profile No results for input(s): INR, PROTIME in the last 168 hours.  CBC: Recent Labs  Lab 05/13/23 0958 05/14/23 0747  WBC 7.7 8.8  NEUTROABS 5.5  --   HGB 12.8* 12.4*  HCT 39.6 38.2*  MCV 83.2 84.0  PLT 204 200   Cardiac Enzymes: No results for input(s): CKTOTAL, CKMB, CKMBINDEX, TROPONINI in the last 168 hours. BNP:  Invalid input(s): POCBNP CBG: Recent Labs  Lab 05/13/23 2243 05/14/23 0252 05/14/23 0755  GLUCAP 372* 309* 213*   D-Dimer No results for input(s): DDIMER in the last 72 hours. Hgb A1c No results for input(s): HGBA1C in the last 72 hours. Lipid Profile No results for input(s): CHOL, HDL, LDLCALC, TRIG, CHOLHDL, LDLDIRECT in the last 72 hours. Thyroid  function studies No results for input(s): TSH, T4TOTAL, T3FREE, THYROIDAB in the last 72 hours.  Invalid input(s): FREET3 Anemia work up No results for input(s): VITAMINB12, FOLATE, FERRITIN, TIBC, IRON, RETICCTPCT in the last 72 hours. Microbiology Recent Results (from the past 240 hours)  Resp panel by RT-PCR (RSV, Flu A&B, Covid) Anterior Nasal Swab     Status: None   Collection Time: 05/13/23  9:52 AM   Specimen: Anterior Nasal Swab  Result Value Ref Range Status   SARS Coronavirus 2 by RT PCR NEGATIVE NEGATIVE Final    Comment: (NOTE) SARS-CoV-2 target nucleic acids are NOT DETECTED.  The SARS-CoV-2 RNA is generally detectable in  upper respiratory specimens during the acute phase of infection. The lowest concentration of SARS-CoV-2 viral copies this assay can detect is 138 copies/mL. A negative result does not preclude SARS-Cov-2 infection and should not be used as the sole basis for treatment or other patient management decisions. A negative result may occur with  improper specimen collection/handling, submission of specimen other than nasopharyngeal swab, presence of viral mutation(s) within the areas targeted by this assay, and inadequate number of viral copies(<138 copies/mL). A negative result must be combined with clinical observations, patient history, and epidemiological information. The expected result is Negative.  Fact Sheet for Patients:  bloggercourse.com  Fact Sheet for Healthcare Providers:  seriousbroker.it  This test is no t yet approved or cleared by the [place]  FDA and  has been authorized for detection and/or diagnosis of SARS-CoV-2 by FDA under an Emergency Use Authorization (EUA). This EUA will remain  in effect (meaning this test can be used) for the duration of the COVID-19 declaration under Section 564(b)(1) of the Act, 21 U.S.C.section 360bbb-3(b)(1), unless the authorization is terminated  or revoked sooner.       Influenza A by PCR NEGATIVE NEGATIVE Final   Influenza B by PCR NEGATIVE NEGATIVE Final    Comment: (NOTE) The Xpert Xpress SARS-CoV-2/FLU/RSV plus assay is intended as an aid in the diagnosis of influenza from Nasopharyngeal swab specimens and should not be used as a sole basis for treatment. Nasal washings and aspirates are unacceptable for Xpert Xpress SARS-CoV-2/FLU/RSV testing.  Fact Sheet for Patients: bloggercourse.com  Fact Sheet for Healthcare Providers: seriousbroker.it  This test is not yet approved or cleared by the [place]  FDA and has been  authorized for detection and/or diagnosis of SARS-CoV-2 by FDA under an Emergency Use Authorization (EUA). This EUA will remain in effect (meaning this test can be used) for the duration of the COVID-19 declaration under Section 564(b)(1) of the Act, 21 U.S.C. section 360bbb-3(b)(1), unless the authorization is terminated or revoked.     Resp Syncytial Virus by PCR NEGATIVE NEGATIVE Final    Comment: (NOTE) Fact Sheet for Patients: bloggercourse.com  Fact Sheet for Healthcare Providers: seriousbroker.it  This test is not yet approved or cleared by the [place]  FDA and has been authorized for detection and/or diagnosis of SARS-CoV-2 by FDA under an Emergency Use Authorization (EUA). This EUA will remain in effect (meaning this test can be used) for the duration of the COVID-19 declaration under Section 564(b)(1) of  the Act, 21 U.S.C. section 360bbb-3(b)(1), unless the authorization is terminated or revoked.  Performed at [facility], [address] [place], [place] [address]   Group A Strep by PCR     Status: None   Collection Time: 05/13/23  9:52 AM   Specimen: Anterior Nasal Swab; Sterile Swab  Result Value Ref Range Status   Group A Strep by PCR NOT DETECTED NOT DETECTED Final    Comment: Performed at [facility], [address] [place], [place] [address]  Respiratory (~20 pathogens) panel by PCR     Status: Abnormal   Collection Time: 05/14/23  3:11 AM   Specimen: Nasopharyngeal Swab; Respiratory  Result Value Ref Range Status   Adenovirus NOT DETECTED NOT DETECTED Final   Coronavirus 229E NOT DETECTED NOT DETECTED Final    Comment: (NOTE) The Coronavirus on the Respiratory Panel, DOES NOT test for the novel  Coronavirus (2019 nCoV)    Coronavirus HKU1 NOT DETECTED NOT DETECTED Final   Coronavirus NL63 NOT DETECTED NOT DETECTED Final   Coronavirus OC43 NOT DETECTED NOT DETECTED Final    Metapneumovirus NOT DETECTED NOT DETECTED Final   Rhinovirus / Enterovirus NOT DETECTED NOT DETECTED Final   Influenza A NOT DETECTED NOT DETECTED Final   Influenza B NOT DETECTED NOT DETECTED Final   Parainfluenza Virus 1 NOT DETECTED NOT DETECTED Final   Parainfluenza Virus 2 DETECTED (A) NOT DETECTED Final   Parainfluenza Virus 3 NOT DETECTED NOT DETECTED Final   Parainfluenza Virus 4 NOT DETECTED NOT DETECTED Final   Respiratory Syncytial Virus NOT DETECTED NOT DETECTED Final   Bordetella pertussis NOT DETECTED NOT DETECTED Final   Bordetella Parapertussis NOT DETECTED NOT DETECTED Final   Chlamydophila pneumoniae NOT DETECTED NOT DETECTED Final   Mycoplasma pneumoniae NOT DETECTED NOT DETECTED Final    Comment: Performed at [facility], [address], [place], [place] [address]     Discharge Instructions:   Discharge Instructions     Call MD for:  difficulty breathing, headache or visual disturbances   Complete by: As directed    Call MD for:  temperature >100.4   Complete by: As directed    Diet Carb Modified   Complete by: As directed    Discharge instructions   Complete by: As directed    Do not overexert.  Follow-up with your primary care provider in 1 week.  Take inhalers steroids and medications as prescribed.  Seek medical attention for worsening symptoms.  Drink warm fluids.  Stay away from cold air.   Increase activity slowly   Complete by: As directed       Allergies as of 05/14/2023       Reactions   Amoxicillin Nausea And Vomiting   Dilaudid  [hydromorphone ] Nausea And Vomiting   Lisinopril  Cough      Simvastatin Other (See Comments)   myalgia   Penicillins Rash        Medication List     PAUSE taking these medications    losartan  100 MG tablet Wait to take this until: May 16, 2023 Morning Commonly known as: COZAAR  TAKE 1 TABLET BY MOUTH DAILY       STOP taking these medications    furosemide  20 MG tablet Commonly known as:  LASIX    Ozempic  (0.25 or 0.5 MG/DOSE) 2 MG/3ML Sopn Generic drug: Semaglutide (0.25 or 0.5MG /DOS)       TAKE these medications    albuterol  108 (90 Base) MCG/ACT inhaler  Commonly known as: VENTOLIN  HFA Inhale 2 puffs into the lungs every 6 (six) hours as needed for wheezing or shortness of breath.   amLODipine  5 MG tablet Commonly known as: NORVASC  Take 1 tablet (5 mg total) by mouth daily.   azithromycin  250 MG tablet Commonly known as: ZITHROMAX  Take 1 tablet (250 mg total) by mouth daily. Start taking on: May 15, 2023   B-D ULTRAFINE III SHORT PEN 31G X 8 MM Misc Generic drug: Insulin  Pen Needle USE AS DIRECTED   chlorpheniramine-HYDROcodone  10-8 MG/5ML Commonly known as: TUSSIONEX Take 5 mLs by mouth every 12 (twelve) hours as needed for cough.   clobetasol  0.05 % external solution Commonly known as: TEMOVATE  Apply 1 application. topically 2 (two) times daily.   clobetasol  cream 0.05 % Commonly known as: TEMOVATE  Apply 1 Application topically 2 (two) times daily as needed (dry skin).   cloNIDine  0.1 MG tablet Commonly known as: CATAPRES  TAKE 1 TABLET BY MOUTH THREE TIMES DAILY AS NEEDED. TAKE IF SYSTOLIC BLOOD PRESSURE>170 What changed: See the new instructions.   empagliflozin  10 MG Tabs tablet Commonly known as: Jardiance  Take 1 tablet (10 mg total) by mouth daily. What changed: Another medication with the same name was removed. Continue taking this medication, and follow the directions you see here.   gabapentin  600 MG tablet Commonly known as: NEURONTIN  TAKE 1 TABLET BY MOUTH TWICE DAILY   glimepiride  2 MG tablet Commonly known as: AMARYL  Take 2 mg by mouth daily.   methocarbamol  750 MG tablet Commonly known as: ROBAXIN  TAKE ONE TABLET BY MOUTH THREE TIMES DAILY AS NEEDED FOR MUSCLE SPASMS /pain   metoprolol  succinate 25 MG 24 hr tablet Commonly known as: TOPROL -XL TAKE 1 TABLET BY MOUTH IN THE MORNING AND AT BEDTIME   Mounjaro  5 MG/0.5ML  Pen Generic drug: tirzepatide  Inject 5 mg into the skin once a week. What changed:  additional instructions Another medication with the same name was removed. Continue taking this medication, and follow the directions you see here.   ondansetron  4 MG tablet Commonly known as: ZOFRAN  Take 1 tablet (4 mg total) by mouth every 8 (eight) hours as needed for nausea or vomiting. What changed: when to take this   predniSONE  10 MG tablet Commonly known as: DELTASONE  Take 3 tablets (30 mg total) by mouth daily with breakfast for 3 days.   rosuvastatin  40 MG tablet Commonly known as: CRESTOR  Take 40 mg by mouth at bedtime.   tamsulosin  0.4 MG Caps capsule Commonly known as: FLOMAX  TAKE ONE CAPSULE BY MOUTH DAILY AFTER SUPPER   Toujeo  Max SoloStar 300 UNIT/ML Solostar Pen Generic drug: insulin  glargine (2 Unit Dial ) Inject 60 Units into the skin every morning.   triamcinolone  ointment 0.5 % Commonly known as: KENALOG  Apply 1 Application topically 4 (four) times daily as needed.   Vitamin D  50 MCG (2000 UT) tablet Take 2,000 Units by mouth daily.        Follow-up Information     Plotnikov, Karlynn GAILS, MD Follow up in 1 week(s).   Specialty: Internal Medicine Contact information: [address] [place] [address] [phone]                  Time coordinating discharge: 39 minutes  Signed:  Enrique Weiss  [facility] 05/14/2023, 1:17 PM

## 2023-05-14 NOTE — Progress Notes (Signed)
   05/14/23 0335  BiPAP/CPAP/SIPAP  $ Non-Invasive Home Ventilator  Initial  BiPAP/CPAP/SIPAP Pt Type Adult  BiPAP/CPAP/SIPAP  (home machine)  Flow Rate 2 lpm (Pt states he would like to  use o2 at home he does not use any)  Patient Home Equipment Yes  Safety Check Completed by RT for Home Unit Yes, no issues noted  BiPAP/CPAP /SiPAP Vitals  Resp 11  MEWS Score/Color  MEWS Score 1  MEWS Score Color Green

## 2023-05-14 NOTE — Plan of Care (Addendum)
 Patient is alert and oriented, wife and son at the bedside. Nebulizer txt administered per respiratory and medications sent to [facility]. No further needs.  Problem: Education: Goal: Knowledge of General Education information will improve Description: Including pain rating scale, medication(s)/side effects and non-pharmacologic comfort measures Outcome: Adequate for Discharge   Problem: Health Behavior/Discharge Planning: Goal: Ability to manage health-related needs will improve Outcome: Adequate for Discharge   Problem: Clinical Measurements: Goal: Ability to maintain clinical measurements within normal limits will improve Outcome: Adequate for Discharge Goal: Will remain free from infection Outcome: Adequate for Discharge Goal: Diagnostic test results will improve Outcome: Adequate for Discharge Goal: Respiratory complications will improve Outcome: Adequate for Discharge Goal: Cardiovascular complication will be avoided Outcome: Adequate for Discharge   Problem: Activity: Goal: Risk for activity intolerance will decrease Outcome: Adequate for Discharge   Problem: Nutrition: Goal: Adequate nutrition will be maintained Outcome: Adequate for Discharge   Problem: Coping: Goal: Level of anxiety will decrease Outcome: Adequate for Discharge   Problem: Elimination: Goal: Will not experience complications related to bowel motility Outcome: Adequate for Discharge Goal: Will not experience complications related to urinary retention Outcome: Adequate for Discharge   Problem: Pain Management: Goal: General experience of comfort will improve Outcome: Adequate for Discharge   Problem: Safety: Goal: Ability to remain free from injury will improve Outcome: Adequate for Discharge   Problem: Skin Integrity: Goal: Risk for impaired skin integrity will decrease Outcome: Adequate for Discharge   Problem: Education: Goal: Knowledge of disease or condition will  improve Outcome: Adequate for Discharge Goal: Knowledge of the prescribed therapeutic regimen will improve Outcome: Adequate for Discharge Goal: Individualized Educational Video(s) Outcome: Adequate for Discharge   Problem: Activity: Goal: Ability to tolerate increased activity will improve Outcome: Adequate for Discharge Goal: Will verbalize the importance of balancing activity with adequate rest periods Outcome: Adequate for Discharge   Problem: Respiratory: Goal: Ability to maintain a clear airway will improve Outcome: Adequate for Discharge Goal: Levels of oxygenation will improve Outcome: Adequate for Discharge Goal: Ability to maintain adequate ventilation will improve Outcome: Adequate for Discharge   Problem: Education: Goal: Ability to describe self-care measures that may prevent or decrease complications (Diabetes Survival Skills Education) will improve Outcome: Adequate for Discharge Goal: Individualized Educational Video(s) Outcome: Adequate for Discharge   Problem: Coping: Goal: Ability to adjust to condition or change in health will improve Outcome: Adequate for Discharge   Problem: Fluid Volume: Goal: Ability to maintain a balanced intake and output will improve Outcome: Adequate for Discharge   Problem: Health Behavior/Discharge Planning: Goal: Ability to identify and utilize available resources and services will improve Outcome: Adequate for Discharge Goal: Ability to manage health-related needs will improve Outcome: Adequate for Discharge   Problem: Metabolic: Goal: Ability to maintain appropriate glucose levels will improve Outcome: Adequate for Discharge   Problem: Nutritional: Goal: Maintenance of adequate nutrition will improve Outcome: Adequate for Discharge Goal: Progress toward achieving an optimal weight will improve Outcome: Adequate for Discharge   Problem: Skin Integrity: Goal: Risk for impaired skin integrity will decrease Outcome:  Adequate for Discharge   Problem: Tissue Perfusion: Goal: Adequacy of tissue perfusion will improve Outcome: Adequate for Discharge

## 2023-05-16 LAB — HEMOGLOBIN A1C
Hgb A1c MFr Bld: 6.9 % — ABNORMAL HIGH (ref 4.8–5.6)
Mean Plasma Glucose: 151 mg/dL

## 2023-05-17 ENCOUNTER — Encounter: Payer: Self-pay | Admitting: Internal Medicine

## 2023-05-20 ENCOUNTER — Other Ambulatory Visit: Payer: Self-pay | Admitting: Internal Medicine

## 2023-05-20 MED ORDER — ALBUTEROL SULFATE HFA 108 (90 BASE) MCG/ACT IN AERS: 2.0000 | INHALATION_SPRAY | Freq: Four times a day (QID) | RESPIRATORY_TRACT | 3 refills | Status: AC | PRN

## 2023-06-03 ENCOUNTER — Other Ambulatory Visit (HOSPITAL_COMMUNITY): Payer: Self-pay

## 2023-06-05 DIAGNOSIS — Z1211 Encounter for screening for malignant neoplasm of colon: Secondary | ICD-10-CM | POA: Diagnosis not present

## 2023-06-08 ENCOUNTER — Other Ambulatory Visit (HOSPITAL_COMMUNITY): Payer: Self-pay

## 2023-06-08 ENCOUNTER — Other Ambulatory Visit: Payer: Self-pay

## 2023-06-08 MED ORDER — FREESTYLE LIBRE 3 PLUS SENSOR MISC
5 refills | Status: AC
  Filled 2023-06-08: qty 2, 30d supply, fill #0
  Filled 2023-06-08: qty 6, 84d supply, fill #0
  Filled 2023-06-08: qty 6, 90d supply, fill #0
  Filled 2023-08-26: qty 6, 84d supply, fill #1
  Filled 2023-11-11 – 2023-11-19 (×2): qty 6, 84d supply, fill #2
  Filled 2024-02-05: qty 6, 84d supply, fill #3
  Filled 2024-02-05: qty 6, 84d supply, fill #0
  Filled 2024-04-16: qty 6, 84d supply, fill #1

## 2023-06-14 ENCOUNTER — Other Ambulatory Visit: Payer: Self-pay | Admitting: Internal Medicine

## 2023-06-15 DIAGNOSIS — C44519 Basal cell carcinoma of skin of other part of trunk: Secondary | ICD-10-CM | POA: Diagnosis not present

## 2023-06-15 DIAGNOSIS — L821 Other seborrheic keratosis: Secondary | ICD-10-CM | POA: Diagnosis not present

## 2023-06-15 LAB — COLOGUARD: COLOGUARD: NEGATIVE

## 2023-07-05 ENCOUNTER — Other Ambulatory Visit (HOSPITAL_COMMUNITY): Payer: Self-pay

## 2023-07-21 DIAGNOSIS — R609 Edema, unspecified: Secondary | ICD-10-CM | POA: Diagnosis not present

## 2023-07-21 DIAGNOSIS — E7800 Pure hypercholesterolemia, unspecified: Secondary | ICD-10-CM | POA: Diagnosis not present

## 2023-07-21 DIAGNOSIS — E1165 Type 2 diabetes mellitus with hyperglycemia: Secondary | ICD-10-CM | POA: Diagnosis not present

## 2023-07-28 ENCOUNTER — Other Ambulatory Visit (HOSPITAL_COMMUNITY): Payer: Self-pay

## 2023-07-28 DIAGNOSIS — I10 Essential (primary) hypertension: Secondary | ICD-10-CM | POA: Diagnosis not present

## 2023-07-28 DIAGNOSIS — E1165 Type 2 diabetes mellitus with hyperglycemia: Secondary | ICD-10-CM | POA: Diagnosis not present

## 2023-07-28 DIAGNOSIS — E038 Other specified hypothyroidism: Secondary | ICD-10-CM | POA: Diagnosis not present

## 2023-07-28 DIAGNOSIS — E7800 Pure hypercholesterolemia, unspecified: Secondary | ICD-10-CM | POA: Diagnosis not present

## 2023-07-28 MED ORDER — GLIMEPIRIDE 2 MG PO TABS
2.0000 mg | ORAL_TABLET | Freq: Two times a day (BID) | ORAL | 5 refills | Status: AC
  Filled 2023-07-28: qty 180, 90d supply, fill #0
  Filled 2024-01-03: qty 180, 90d supply, fill #1

## 2023-07-28 MED ORDER — FREESTYLE LIBRE 3 PLUS SENSOR MISC
5 refills | Status: DC
  Filled 2023-07-28: qty 6, 84d supply, fill #0

## 2023-07-28 MED ORDER — MOUNJARO 7.5 MG/0.5ML ~~LOC~~ SOAJ
7.5000 mg | SUBCUTANEOUS | 5 refills | Status: AC
Start: 2023-07-28 — End: ?
  Filled 2023-07-28: qty 2, 28d supply, fill #0
  Filled 2023-08-26: qty 2, 28d supply, fill #1
  Filled 2023-10-04: qty 2, 28d supply, fill #2
  Filled 2023-10-31: qty 2, 28d supply, fill #3
  Filled 2023-11-11 – 2023-12-05 (×2): qty 2, 28d supply, fill #4
  Filled 2024-01-22: qty 2, 28d supply, fill #5

## 2023-07-28 MED ORDER — TOUJEO MAX SOLOSTAR 300 UNIT/ML ~~LOC~~ SOPN
40.0000 [IU] | PEN_INJECTOR | Freq: Every morning | SUBCUTANEOUS | 5 refills | Status: AC
Start: 2023-07-28 — End: ?
  Filled 2023-07-28: qty 3, 22d supply, fill #0
  Filled 2023-12-05: qty 12, 90d supply, fill #0
  Filled 2023-12-06: qty 9, 67d supply, fill #0
  Filled 2024-04-16: qty 9, 67d supply, fill #1

## 2023-07-31 DIAGNOSIS — G4733 Obstructive sleep apnea (adult) (pediatric): Secondary | ICD-10-CM | POA: Diagnosis not present

## 2023-08-01 ENCOUNTER — Encounter: Payer: Self-pay | Admitting: Internal Medicine

## 2023-08-01 ENCOUNTER — Other Ambulatory Visit (HOSPITAL_COMMUNITY): Payer: Self-pay

## 2023-08-01 ENCOUNTER — Ambulatory Visit (INDEPENDENT_AMBULATORY_CARE_PROVIDER_SITE_OTHER): Payer: Medicare Other | Admitting: Internal Medicine

## 2023-08-01 VITALS — BP 118/70 | HR 70 | Temp 98.2°F | Ht 69.0 in | Wt 248.2 lb

## 2023-08-01 DIAGNOSIS — E785 Hyperlipidemia, unspecified: Secondary | ICD-10-CM | POA: Diagnosis not present

## 2023-08-01 DIAGNOSIS — I10 Essential (primary) hypertension: Secondary | ICD-10-CM | POA: Diagnosis not present

## 2023-08-01 DIAGNOSIS — E1165 Type 2 diabetes mellitus with hyperglycemia: Secondary | ICD-10-CM | POA: Diagnosis not present

## 2023-08-01 DIAGNOSIS — R059 Cough, unspecified: Secondary | ICD-10-CM

## 2023-08-01 DIAGNOSIS — Z794 Long term (current) use of insulin: Secondary | ICD-10-CM

## 2023-08-01 DIAGNOSIS — G4733 Obstructive sleep apnea (adult) (pediatric): Secondary | ICD-10-CM

## 2023-08-01 MED ORDER — METOPROLOL SUCCINATE ER 25 MG PO TB24
25.0000 mg | ORAL_TABLET | Freq: Every day | ORAL | 3 refills | Status: AC
  Filled 2023-08-01 – 2023-08-26 (×2): qty 90, 90d supply, fill #0
  Filled 2023-11-11 – 2023-11-19 (×2): qty 90, 90d supply, fill #1
  Filled 2024-01-03 – 2024-02-17 (×5): qty 90, 90d supply, fill #2
  Filled 2024-05-29: qty 90, 90d supply, fill #3

## 2023-08-01 MED ORDER — AMLODIPINE BESYLATE 5 MG PO TABS
5.0000 mg | ORAL_TABLET | Freq: Every day | ORAL | 3 refills | Status: AC
  Filled 2023-08-01: qty 90, 90d supply, fill #0
  Filled 2023-10-31: qty 90, 90d supply, fill #1
  Filled 2024-02-24: qty 90, 90d supply, fill #2
  Filled 2024-05-29: qty 90, 90d supply, fill #3

## 2023-08-01 MED ORDER — HYDROCOD POLI-CHLORPHE POLI ER 10-8 MG/5ML PO SUER
5.0000 mL | Freq: Two times a day (BID) | ORAL | 0 refills | Status: DC | PRN
  Filled 2023-08-01: qty 230, 23d supply, fill #0

## 2023-08-01 MED ORDER — GABAPENTIN 600 MG PO TABS
600.0000 mg | ORAL_TABLET | Freq: Two times a day (BID) | ORAL | 3 refills | Status: AC
  Filled 2023-08-01 – 2023-10-04 (×2): qty 180, 90d supply, fill #0
  Filled 2024-02-24: qty 180, 90d supply, fill #1

## 2023-08-01 MED ORDER — TAMSULOSIN HCL 0.4 MG PO CAPS
0.4000 mg | ORAL_CAPSULE | Freq: Every day | ORAL | 3 refills | Status: AC
  Filled 2023-08-01 – 2023-08-26 (×2): qty 90, 90d supply, fill #0
  Filled 2023-12-05: qty 90, 90d supply, fill #1
  Filled 2024-02-24: qty 90, 90d supply, fill #2

## 2023-08-01 MED ORDER — LOSARTAN POTASSIUM 100 MG PO TABS
100.0000 mg | ORAL_TABLET | Freq: Every day | ORAL | 3 refills | Status: AC
  Filled 2023-08-01: qty 90, 90d supply, fill #0
  Filled 2023-10-31: qty 90, 90d supply, fill #1
  Filled 2024-02-24: qty 90, 90d supply, fill #2
  Filled 2024-05-29: qty 90, 90d supply, fill #3

## 2023-08-01 MED ORDER — EMPAGLIFLOZIN 10 MG PO TABS
10.0000 mg | ORAL_TABLET | Freq: Every day | ORAL | 3 refills | Status: AC
  Filled 2023-08-01 – 2023-10-04 (×2): qty 90, 90d supply, fill #0
  Filled 2024-01-03: qty 90, 90d supply, fill #1
  Filled 2024-04-16: qty 90, 90d supply, fill #2

## 2023-08-01 NOTE — Assessment & Plan Note (Signed)
On Crestor Hold Crestor if cramps Use Magnesium oil spray for muscle cramps

## 2023-08-01 NOTE — Assessment & Plan Note (Addendum)
 LBP, radiculitis - s/p surgery Oxy po if pain returned Jardiance is too $$$ - Rx for [name] D/c Ozempic On Mounjaro now - Dr Talmage Nap Pt had labs w/Dr Talmage Nap

## 2023-08-01 NOTE — Progress Notes (Signed)
 Subjective:  Patient ID: [name] [name], male    DOB: 06/07/1950  Age: 73 y.o. MRN: [id number]  CC: Medical Management of Chronic Issues (4 Month Follow Up. Med refills)   HPI [name] [name] presents for DM, HTN, CAD Recent URI Pt had labs w/Dr Talmage Nap  Outpatient Medications Prior to Visit  Medication Sig Dispense Refill   albuterol (VENTOLIN HFA) 108 (90 Base) MCG/ACT inhaler Inhale 2 puffs into the lungs every 6 (six) hours as needed for wheezing or shortness of breath. 18 g 3   amLODipine (NORVASC) 5 MG tablet Take 1 tablet (5 mg total) by mouth daily. 90 tablet 3   B-D ULTRAFINE III SHORT PEN 31G X 8 MM MISC USE AS DIRECTED 200 each 3   chlorpheniramine-HYDROcodone (TUSSIONEX) 10-8 MG/5ML Take 5 mLs by mouth every 12 (twelve) hours as needed for cough. 230 mL 0   Cholecalciferol (VITAMIN D) 50 MCG (2000 UT) tablet Take 2,000 Units by mouth daily.     clobetasol (TEMOVATE) 0.05 % external solution Apply 1 application. topically 2 (two) times daily. 50 mL 2   cloNIDine (CATAPRES) 0.1 MG tablet TAKE 1 TABLET BY MOUTH THREE TIMES DAILY AS NEEDED. TAKE IF SYSTOLIC BLOOD PRESSURE>170 (Patient taking differently: Take 0.1 mg by mouth as needed (take if systolic BP is > 409).) 90 tablet 2   Continuous Glucose Sensor (FREESTYLE LIBRE 3 PLUS SENSOR) MISC Use to monitor blood sugar levels continuously, changing every 15 days. 6 each 5   empagliflozin (JARDIANCE) 10 MG TABS tablet Take 1 tablet (10 mg total) by mouth daily. 90 tablet 3   gabapentin (NEURONTIN) 600 MG tablet TAKE 1 TABLET BY MOUTH TWICE DAILY 180 tablet 3   glimepiride (AMARYL) 2 MG tablet Take 1 tablet (2 mg total) by mouth 2 (two) times daily. 180 tablet 5   insulin glargine, 2 Unit Dial, (TOUJEO MAX SOLOSTAR) 300 UNIT/ML Solostar Pen Inject 40 Units into the skin in the morning. 45 mL 5   losartan (COZAAR) 100 MG tablet TAKE 1 TABLET BY MOUTH DAILY 90 tablet 3   metoprolol succinate (TOPROL-XL) 25 MG 24 hr tablet TAKE 1 TABLET  BY MOUTH IN THE MORNING AND AT BEDTIME 180 tablet 3   ondansetron (ZOFRAN) 4 MG tablet Take 1 tablet (4 mg total) by mouth every 8 (eight) hours as needed for nausea or vomiting. (Patient taking differently: Take 4 mg by mouth as needed for nausea or vomiting.) 20 tablet 0   rosuvastatin (CRESTOR) 40 MG tablet Take 40 mg by mouth at bedtime.     tamsulosin (FLOMAX) 0.4 MG CAPS capsule TAKE ONE CAPSULE BY MOUTH DAILY AFTER SUPPER 90 capsule 3   tirzepatide (MOUNJARO) 7.5 MG/0.5ML Pen Inject 7.5 mg into the skin once a week. 2 mL 5   azithromycin (ZITHROMAX) 250 MG tablet Take 1 tablet (250 mg total) by mouth daily. (Patient not taking: Reported on 08/01/2023) 3 each 0   clobetasol cream (TEMOVATE) 0.05 % Apply 1 Application topically 2 (two) times daily as needed (dry skin). (Patient not taking: Reported on 08/01/2023)     Continuous Glucose Sensor (FREESTYLE LIBRE 3 PLUS SENSOR) MISC Place 1 sensor onto skin every 15 days. (Patient not taking: Reported on 08/01/2023) 6 each 5   glimepiride (AMARYL) 2 MG tablet Take 2 mg by mouth daily. (Patient not taking: Reported on 08/01/2023)     methocarbamol (ROBAXIN) 750 MG tablet TAKE ONE TABLET BY MOUTH THREE TIMES DAILY AS NEEDED FOR MUSCLE SPASMS /pain (Patient not  taking: No sig reported) 30 tablet 1   tirzepatide (MOUNJARO) 5 MG/0.5ML Pen Inject 5 mg into the skin once a week. (Patient not taking: Reported on 08/01/2023) 2 mL 5   TOUJEO MAX SOLOSTAR 300 UNIT/ML Solostar Pen Inject 60 Units into the skin every morning. (Patient not taking: Reported on 08/01/2023)     No facility-administered medications prior to visit.    ROS: Review of Systems  Constitutional:  Negative for appetite change, fatigue and unexpected weight change.  HENT:  Negative for congestion, nosebleeds, sneezing, sore throat and trouble swallowing.   Eyes:  Negative for itching and visual disturbance.  Respiratory:  Positive for cough. Negative for shortness of breath and wheezing.    Cardiovascular:  Negative for chest pain, palpitations and leg swelling.  Gastrointestinal:  Negative for abdominal distention, blood in stool, diarrhea and nausea.  Genitourinary:  Negative for frequency and hematuria.  Musculoskeletal:  Positive for back pain. Negative for gait problem, joint swelling and neck pain.  Skin:  Negative for rash.  Neurological:  Negative for dizziness, tremors, speech difficulty and weakness.  Psychiatric/Behavioral:  Negative for agitation, dysphoric mood, sleep disturbance and suicidal ideas. The patient is not nervous/anxious.     Objective:  BP 118/70   Pulse 70   Temp 98.2 F (36.8 C)   Ht 5\' 9"  (1.753 m)   Wt 248 lb 3.2 oz (112.6 kg)   SpO2 99%   BMI 36.65 kg/m   BP Readings from Last 3 Encounters:  08/01/23 118/70  05/14/23 (!) 159/79  03/31/23 120/70    Wt Readings from Last 3 Encounters:  08/01/23 248 lb 3.2 oz (112.6 kg)  05/13/23 251 lb (113.9 kg)  03/31/23 262 lb 12.8 oz (119.2 kg)    Physical Exam Constitutional:      General: He is not in acute distress.    Appearance: He is well-developed. He is obese.     Comments: NAD  Eyes:     Conjunctiva/sclera: Conjunctivae normal.     Pupils: Pupils are equal, round, and reactive to light.  Neck:     Thyroid: No thyromegaly.     Vascular: No JVD.  Cardiovascular:     Rate and Rhythm: Normal rate and regular rhythm.     Heart sounds: Normal heart sounds. No murmur heard.    No friction rub. No gallop.  Pulmonary:     Effort: Pulmonary effort is normal. No respiratory distress.     Breath sounds: Normal breath sounds. No wheezing or rales.  Chest:     Chest wall: No tenderness.  Abdominal:     General: Bowel sounds are normal. There is no distension.     Palpations: Abdomen is soft. There is no mass.     Tenderness: There is no abdominal tenderness. There is no guarding or rebound.  Musculoskeletal:        General: No tenderness. Normal range of motion.     Cervical back:  Normal range of motion.  Lymphadenopathy:     Cervical: No cervical adenopathy.  Skin:    General: Skin is warm and dry.     Findings: No rash.  Neurological:     Mental Status: He is alert and oriented to person, place, and time.     Cranial Nerves: No cranial nerve deficit.     Motor: No abnormal muscle tone.     Coordination: Coordination normal.     Gait: Gait normal.     Deep Tendon Reflexes: Reflexes are normal  and symmetric.  Psychiatric:        Behavior: Behavior normal.        Thought Content: Thought content normal.        Judgment: Judgment normal.     Lab Results  Component Value Date   WBC 8.8 05/14/2023   HGB 12.4 (L) 05/14/2023   HCT 38.2 (L) 05/14/2023   PLT 200 05/14/2023   GLUCOSE 215 (H) 05/14/2023   CHOL 165 08/19/2020   TRIG 111.0 08/19/2020   HDL 44.60 08/19/2020   LDLDIRECT 200.0 02/17/2016   LDLCALC 98 08/19/2020   ALT 21 05/13/2023   AST 27 05/13/2023   NA 140 05/14/2023   K 4.3 05/14/2023   CL 105 05/14/2023   CREATININE 2.24 (H) 05/14/2023   BUN 37 (H) 05/14/2023   CO2 28 05/14/2023   TSH 3.46 08/19/2020   PSA 0.73 02/04/2021   INR 1.08 02/02/2011   HGBA1C 6.9 (H) 05/14/2023   MICROALBUR 182.1 (H) 08/19/2020    DG Chest 2 View Result Date: 05/13/2023 CLINICAL DATA:  Cough.  Shortness of breath. EXAM: CHEST - 2 VIEW COMPARISON:  12/28/2021. FINDINGS: Low lung volume. There are probable atelectatic changes at the lung bases. Bilateral lung fields are otherwise clear. No acute consolidation or lung collapse. Bilateral costophrenic angles are clear. Normal cardio-mediastinal silhouette. No acute osseous abnormalities. The soft tissues are within normal limits. IMPRESSION: No active cardiopulmonary disease. Electronically Signed   By: Jules Schick M.D.   On: 05/13/2023 11:59    Assessment & Plan:   Problem List Items Addressed This Visit     DM2 (diabetes mellitus, type 2) (HCC) - Primary   LBP, radiculitis - s/p surgery Oxy po if pain  returned Jardiance is too $$$ - Rx for Farxiga D/c Ozempic On Mounjaro now - Dr Talmage Nap      Dyslipidemia   On Crestor Hold Crestor if cramps Use Magnesium oil spray for muscle cramps        Obesity, morbid (HCC)   F/u w/Dr Talmage Nap On Mounjaro      OSA on CPAP   F/u w/Dr Vassie Loll      Essential hypertension   On Mounjaro now - Dr Talmage Nap - pt lost wt Toprol XL, ASA, Amlodipine, Losartan      Cough in adult   Recurrent/ chronic, primarily at nighttime.  Could be CPAP use related.  Negative workup Tussionex prn - rare use  Potential benefits of opioids use as well as potential risks (i.e. addiction risk, apnea etc) and complications (i.e. Somnolence, constipation and others) were explained to the patient and were aknowledged. Pulmonary consult was offered         No orders of the defined types were placed in this encounter.     Follow-up: Return in about 3 months (around 11/01/2023) for a follow-up visit.  Sonda Primes, MD

## 2023-08-01 NOTE — Assessment & Plan Note (Addendum)
 Recurrent/ chronic, primarily at nighttime.  Could be CPAP use related.  Negative workup Tussionex prn - rare use  Potential benefits of opioids use as well as potential risks (i.e. addiction risk, apnea etc) and complications (i.e. Somnolence, constipation and others) were explained to the patient and were aknowledged. Pulmonary consult was offered

## 2023-08-01 NOTE — Assessment & Plan Note (Signed)
 On Mounjaro now - Dr Talmage Nap - pt lost wt Toprol XL, ASA, Amlodipine, Losartan

## 2023-08-01 NOTE — Assessment & Plan Note (Signed)
F/u w/Dr Alva 

## 2023-08-01 NOTE — Assessment & Plan Note (Addendum)
 F/u w/Dr Talmage Nap On Mounjaro Pt had labs w/Dr Talmage Nap

## 2023-08-26 ENCOUNTER — Other Ambulatory Visit: Payer: Self-pay

## 2023-08-26 ENCOUNTER — Other Ambulatory Visit (HOSPITAL_COMMUNITY): Payer: Self-pay

## 2023-09-14 DIAGNOSIS — E038 Other specified hypothyroidism: Secondary | ICD-10-CM | POA: Diagnosis not present

## 2023-09-14 DIAGNOSIS — E1165 Type 2 diabetes mellitus with hyperglycemia: Secondary | ICD-10-CM | POA: Diagnosis not present

## 2023-09-20 DIAGNOSIS — L820 Inflamed seborrheic keratosis: Secondary | ICD-10-CM | POA: Diagnosis not present

## 2023-09-20 DIAGNOSIS — L570 Actinic keratosis: Secondary | ICD-10-CM | POA: Diagnosis not present

## 2023-09-20 DIAGNOSIS — D485 Neoplasm of uncertain behavior of skin: Secondary | ICD-10-CM | POA: Diagnosis not present

## 2023-10-04 ENCOUNTER — Other Ambulatory Visit (HOSPITAL_COMMUNITY): Payer: Self-pay

## 2023-10-06 DIAGNOSIS — D0439 Carcinoma in situ of skin of other parts of face: Secondary | ICD-10-CM | POA: Diagnosis not present

## 2023-10-24 DIAGNOSIS — C44619 Basal cell carcinoma of skin of left upper limb, including shoulder: Secondary | ICD-10-CM | POA: Diagnosis not present

## 2023-10-28 DIAGNOSIS — K080 Exfoliation of teeth due to systemic causes: Secondary | ICD-10-CM | POA: Diagnosis not present

## 2023-10-31 ENCOUNTER — Other Ambulatory Visit (HOSPITAL_COMMUNITY): Payer: Self-pay

## 2023-10-31 DIAGNOSIS — G4733 Obstructive sleep apnea (adult) (pediatric): Secondary | ICD-10-CM | POA: Diagnosis not present

## 2023-11-12 ENCOUNTER — Other Ambulatory Visit (HOSPITAL_COMMUNITY): Payer: Self-pay

## 2023-11-14 ENCOUNTER — Other Ambulatory Visit (HOSPITAL_COMMUNITY): Payer: Self-pay

## 2023-11-15 ENCOUNTER — Other Ambulatory Visit: Payer: Self-pay | Admitting: Internal Medicine

## 2023-11-15 ENCOUNTER — Other Ambulatory Visit (HOSPITAL_COMMUNITY): Payer: Self-pay

## 2023-11-18 ENCOUNTER — Other Ambulatory Visit (HOSPITAL_COMMUNITY): Payer: Self-pay

## 2023-11-18 MED ORDER — INSUPEN PEN NEEDLES 31G X 8 MM MISC
3 refills | Status: DC
  Filled 2023-11-18: qty 100, 90d supply, fill #0
  Filled 2024-01-03: qty 100, 90d supply, fill #1

## 2023-11-28 ENCOUNTER — Ambulatory Visit (HOSPITAL_BASED_OUTPATIENT_CLINIC_OR_DEPARTMENT_OTHER): Admitting: Pulmonary Disease

## 2023-11-28 ENCOUNTER — Encounter (HOSPITAL_BASED_OUTPATIENT_CLINIC_OR_DEPARTMENT_OTHER): Payer: Self-pay | Admitting: Pulmonary Disease

## 2023-11-28 VITALS — BP 127/82 | HR 72 | Ht 69.0 in | Wt 231.0 lb

## 2023-11-28 DIAGNOSIS — G4733 Obstructive sleep apnea (adult) (pediatric): Secondary | ICD-10-CM | POA: Diagnosis not present

## 2023-11-28 NOTE — Patient Instructions (Addendum)
 X change to auto 8-12 cm  Trial of airfit 30 full face mask  Increase humidity Decrease temp  We discussed inspire

## 2023-11-28 NOTE — Progress Notes (Signed)
 Subjective:    Patient ID: [name] [name], male    DOB: [birth year], 73 y.o.   MRN: [id number]   73  yo diabetic, hypertensive for FU of obstructive sleep apnea. He is brother-in-law to Marcey Lacer, our nurse practitioner.  he was initially diagnosed around 1995  On  AutoSet 11 between 5 and 13 cm.  His previous machine was set between 10 and 18 cm with an average pressure of 15 and maximum pressure 17 cm. He does not like the AutoSet 11 compared to his previous machine    Discussed the use of AI scribe software for clinical note transcription with the patient, who gave verbal consent to proceed.  History of Present Illness [name] [name] is a 73 year old male with obstructive sleep apnea who presents with issues related to CPAP use and dry mouth.  He experiences extreme dry mouth despite using a nasal mask with mouth tape, which disrupts his sleep by waking him up at night. He switched from a full face mask to a nasal mask due to discomfort and difficulty adjusting humidity levels. The CPAP Eleven model, which he recently started using, is less effective in humidity control compared to the previous CPAP Ten model, requiring refilling during the night. He finds better sleep in his recliner without the CPAP, sleeping well until around 3 AM, after which he cannot return to sleep. His apnea events have decreased to 1.2, but he remains concerned about the dry mouth and its impact on sleep quality.  Lost from 249 to 231 lbs with mounjaro  DL >> decreased max CPAP pr to 12 cm, on auto 8-15 cm, large leak ++, good compliance  Significant tests/ events reviewed   CT chest without contrast 01/14/2022 minimal emphysema   1995 NPSG (wt=235#) w/ 53 events/hr & desat to 83%- started on CPAP... 2011  (wt=290#) >>new CPAP machine, last autotitrate showed optimal pressure= 13cmH2O  Review of Systems  neg for any significant sore throat, dysphagia, itching, sneezing, nasal congestion or excess/  purulent secretions, fever, chills, sweats, unintended wt loss, pleuritic or exertional cp, hempoptysis, orthopnea pnd or change in chronic leg swelling. Also denies presyncope, palpitations, heartburn, abdominal pain, nausea, vomiting, diarrhea or change in bowel or urinary habits, dysuria,hematuria, rash, arthralgias, visual complaints, headache, numbness weakness or ataxia.      Objective:   Physical Exam  Gen. Pleasant, obese, in no distress ENT - no lesions, no post nasal drip Neck: No JVD, no thyromegaly, no carotid bruits Lungs: no use of accessory muscles, no dullness to percussion, decreased without rales or rhonchi  Cardiovascular: Rhythm regular, heart sounds  normal, no murmurs or gallops, no peripheral edema Musculoskeletal: No deformities, no cyanosis or clubbing , no tremors        Assessment & Plan:   Assessment and Plan Assessment & Plan Obstructive Sleep Apnea (OSA) OSA management with CPAP is suboptimal due to dry mouth and discomfort. Current auto-adjusting settings are 8-15 cm H2O, but only up to 12 cm H2O is needed due to weight loss. Extreme dry mouth is likely from air leakage with nasal mask. CPAP model 11 has inadequate humidity compared to model 10, contributing to dry mouth. Weight loss has improved OSA metrics, with events down to 1.2. Considering Inspire implant as an alternative, but hesitant due to surgical nature and permanence. More than 90% qualify for Inspire based on airway closure assessment. Insurance, including Medicare, covers it if qualified. Approximately 75-100 patients have been implanted with Inspire, with successful  outcomes after 3-4 months of adjustments. - Adjust CPAP pressure range to 8-12 cm H2O. - Increase CPAP humidity to maximum level. - Consider using CPAP model 10 for better humidity control. - Try F30 full face mask for better comfort and reduced dry mouth. - Order sleep study to evaluate current OSA status. - Discuss potential for  Inspire implant if CPAP adjustments fail. - Plan for endoscopy if proceeding with Inspire implant. - BMI has dropped to 34 making him a candidate for inspire since he seems to be failing CPAP therapy  Diabetes Mellitus Diabetes management is well-controlled with current treatment. Weight loss attributed to diabetes medication, Mounjaro , which is effectively curbing appetite.

## 2023-12-01 ENCOUNTER — Encounter: Payer: Self-pay | Admitting: Internal Medicine

## 2023-12-01 ENCOUNTER — Ambulatory Visit: Admitting: Internal Medicine

## 2023-12-01 VITALS — BP 144/68 | HR 75 | Temp 98.5°F | Ht 69.0 in | Wt 234.0 lb

## 2023-12-01 DIAGNOSIS — E1165 Type 2 diabetes mellitus with hyperglycemia: Secondary | ICD-10-CM

## 2023-12-01 DIAGNOSIS — Z794 Long term (current) use of insulin: Secondary | ICD-10-CM

## 2023-12-01 DIAGNOSIS — R059 Cough, unspecified: Secondary | ICD-10-CM | POA: Diagnosis not present

## 2023-12-01 DIAGNOSIS — Z6834 Body mass index (BMI) 34.0-34.9, adult: Secondary | ICD-10-CM

## 2023-12-01 NOTE — Progress Notes (Signed)
 Subjective:  Patient ID: [name] [name], male    DOB: March 01, 1951  Age: 73 y.o. MRN: [id number]  CC: Follow-up (No concerns )   HPI [name] [name] presents for DM, cough, HTN, OSA. Losing wt...  Outpatient Medications Prior to Visit  Medication Sig Dispense Refill   albuterol  (VENTOLIN  HFA) 108 (90 Base) MCG/ACT inhaler Inhale 2 puffs into the lungs every 6 (six) hours as needed for wheezing or shortness of breath. 18 g 3   amLODipine  (NORVASC ) 5 MG tablet Take 1 tablet (5 mg total) by mouth daily. 90 tablet 3   chlorpheniramine-HYDROcodone  (TUSSIONEX) 10-8 MG/5ML Take 5 mLs by mouth every 12 (twelve) hours as needed for cough. 230 mL 0   Cholecalciferol (VITAMIN D ) 50 MCG (2000 UT) tablet Take 2,000 Units by mouth daily.     clobetasol  (TEMOVATE ) 0.05 % external solution Apply 1 application. topically 2 (two) times daily. 50 mL 2   cloNIDine  (CATAPRES ) 0.1 MG tablet TAKE 1 TABLET BY MOUTH THREE TIMES DAILY AS NEEDED. TAKE IF SYSTOLIC BLOOD PRESSURE>170 (Patient taking differently: Take 0.1 mg by mouth as needed (take if systolic BP is > 829).) 90 tablet 2   Continuous Glucose Sensor (FREESTYLE LIBRE 3 PLUS SENSOR) MISC Use to monitor blood sugar levels continuously, changing every 15 days. 6 each 5   empagliflozin  (JARDIANCE ) 10 MG TABS tablet Take 1 tablet (10 mg total) by mouth daily. 90 tablet 3   gabapentin  (NEURONTIN ) 600 MG tablet Take 1 tablet (600 mg total) by mouth 2 (two) times daily. 180 tablet 3   glimepiride  (AMARYL ) 2 MG tablet Take 1 tablet (2 mg total) by mouth 2 (two) times daily. 180 tablet 5   insulin  glargine, 2 Unit Dial , (TOUJEO  MAX SOLOSTAR) 300 UNIT/ML Solostar Pen Inject 40 Units into the skin in the morning. 45 mL 5   Insulin  Pen Needle (INSUPEN PEN NEEDLES) 31G X 8 MM MISC Use to inject insulin  once a day. 200 each 3   losartan  (COZAAR ) 100 MG tablet Take 1 tablet (100 mg total) by mouth daily. 90 tablet 3   metoprolol  succinate (TOPROL -XL) 25 MG 24 hr tablet  Take 1 tablet (25 mg total) by mouth daily. 180 tablet 3   ondansetron  (ZOFRAN ) 4 MG tablet Take 1 tablet (4 mg total) by mouth every 8 (eight) hours as needed for nausea or vomiting. (Patient taking differently: Take 4 mg by mouth as needed for nausea or vomiting.) 20 tablet 0   rosuvastatin  (CRESTOR ) 40 MG tablet Take 40 mg by mouth at bedtime.     tamsulosin  (FLOMAX ) 0.4 MG CAPS capsule Take 1 capsule (0.4 mg total) by mouth daily after supper. 90 capsule 3   tirzepatide  (MOUNJARO ) 7.5 MG/0.5ML Pen Inject 7.5 mg into the skin once a week. 2 mL 5   No facility-administered medications prior to visit.    ROS: Review of Systems  Constitutional:  Negative for appetite change, fatigue and unexpected weight change.  HENT:  Negative for congestion, nosebleeds, sneezing, sore throat and trouble swallowing.   Eyes:  Negative for itching and visual disturbance.  Respiratory:  Positive for cough.   Cardiovascular:  Negative for chest pain, palpitations and leg swelling.  Gastrointestinal:  Negative for abdominal distention, blood in stool, diarrhea and nausea.  Genitourinary:  Negative for frequency and hematuria.  Musculoskeletal:  Positive for arthralgias and back pain. Negative for gait problem, joint swelling and neck pain.  Skin:  Negative for rash.  Neurological:  Negative for dizziness, tremors, speech  difficulty and weakness.  Psychiatric/Behavioral:  Negative for agitation, dysphoric mood and sleep disturbance. The patient is not nervous/anxious.     Objective:  BP (!) 144/68 (BP Location: Left Arm, Patient Position: Sitting, Cuff Size: Normal)   Pulse 75   Temp 98.5 F (36.9 C) (Oral)   Ht 5' 9 (1.753 m)   Wt 234 lb (106.1 kg)   SpO2 98%   BMI 34.56 kg/m   BP Readings from Last 3 Encounters:  12/01/23 (!) 144/68  11/28/23 127/82  08/01/23 118/70    Wt Readings from Last 3 Encounters:  12/01/23 234 lb (106.1 kg)  11/28/23 231 lb (104.8 kg)  08/01/23 248 lb 3.2 oz (112.6  kg)    Physical Exam Constitutional:      General: He is not in acute distress.    Appearance: He is well-developed. He is obese.     Comments: NAD  Eyes:     Conjunctiva/sclera: Conjunctivae normal.     Pupils: Pupils are equal, round, and reactive to light.  Neck:     Thyroid : No thyromegaly.     Vascular: No JVD.  Cardiovascular:     Rate and Rhythm: Normal rate and regular rhythm.     Heart sounds: Normal heart sounds. No murmur heard.    No friction rub. No gallop.  Pulmonary:     Effort: Pulmonary effort is normal. No respiratory distress.     Breath sounds: Normal breath sounds. No wheezing or rales.  Chest:     Chest wall: No tenderness.  Abdominal:     General: Bowel sounds are normal. There is no distension.     Palpations: Abdomen is soft. There is no mass.     Tenderness: There is no abdominal tenderness. There is no guarding or rebound.  Musculoskeletal:        General: Tenderness present. Normal range of motion.     Cervical back: Normal range of motion.  Lymphadenopathy:     Cervical: No cervical adenopathy.  Skin:    General: Skin is warm and dry.     Findings: No rash.  Neurological:     Mental Status: He is alert and oriented to person, place, and time.     Cranial Nerves: No cranial nerve deficit.     Motor: No abnormal muscle tone.     Coordination: Coordination normal.     Gait: Gait abnormal.     Deep Tendon Reflexes: Reflexes are normal and symmetric.  Psychiatric:        Behavior: Behavior normal.        Thought Content: Thought content normal.        Judgment: Judgment normal.     Lab Results  Component Value Date   WBC 8.8 05/14/2023   HGB 12.4 (L) 05/14/2023   HCT 38.2 (L) 05/14/2023   PLT 200 05/14/2023   GLUCOSE 215 (H) 05/14/2023   CHOL 165 08/19/2020   TRIG 111.0 08/19/2020   HDL 44.60 08/19/2020   LDLDIRECT 200.0 02/17/2016   LDLCALC 98 08/19/2020   ALT 21 05/13/2023   AST 27 05/13/2023   NA 140 05/14/2023   K 4.3  05/14/2023   CL 105 05/14/2023   CREATININE 2.24 (H) 05/14/2023   BUN 37 (H) 05/14/2023   CO2 28 05/14/2023   TSH 3.46 08/19/2020   PSA 0.73 02/04/2021   INR 1.08 02/02/2011   HGBA1C 6.9 (H) 05/14/2023   MICROALBUR 23.0 (H) 04/09/2008    DG Chest 2 View Result Date:  05/13/2023 CLINICAL DATA:  Cough.  Shortness of breath. EXAM: CHEST - 2 VIEW COMPARISON:  12/28/2021. FINDINGS: Low lung volume. There are probable atelectatic changes at the lung bases. Bilateral lung fields are otherwise clear. No acute consolidation or lung collapse. Bilateral costophrenic angles are clear. Normal cardio-mediastinal silhouette. No acute osseous abnormalities. The soft tissues are within normal limits. IMPRESSION: No active cardiopulmonary disease. Electronically Signed   By: Ree Molt M.D.   On: 05/13/2023 11:59    Assessment & Plan:   Problem List Items Addressed This Visit     Cough in adult   Recurrent/ chronic, primarily at nighttime.  Could be CPAP use related.  Negative workup. Seeing Dr Alva Tussionex prn - rare use  Potential benefits of opioids use as well as potential risks (i.e. addiction risk, apnea etc) and complications (i.e. Somnolence, constipation and others) were explained to the patient and were aknowledged. Pulmonary consult was offered      Obesity, morbid (HCC) - Primary   F/u w/Dr Tommas On Mounjaro  Pt had labs w/Dr Tommas      Type 2 diabetes mellitus with hyperglycemia (HCC)   F/u w/Dr Tommas         No orders of the defined types were placed in this encounter.     Follow-up: Return in about 3 months (around 03/02/2024) for a follow-up visit.  Marolyn Noel, MD

## 2023-12-01 NOTE — Assessment & Plan Note (Signed)
F/u w/Dr Balan 

## 2023-12-01 NOTE — Assessment & Plan Note (Signed)
 Recurrent/ chronic, primarily at nighttime.  Could be CPAP use related.  Negative workup. Seeing Dr Alva Tussionex prn - rare use  Potential benefits of opioids use as well as potential risks (i.e. addiction risk, apnea etc) and complications (i.e. Somnolence, constipation and others) were explained to the patient and were aknowledged. Pulmonary consult was offered

## 2023-12-01 NOTE — Assessment & Plan Note (Signed)
 F/u w/Dr Talmage Nap On Mounjaro Pt had labs w/Dr Talmage Nap

## 2023-12-05 ENCOUNTER — Other Ambulatory Visit (HOSPITAL_COMMUNITY): Payer: Self-pay

## 2023-12-06 ENCOUNTER — Other Ambulatory Visit (HOSPITAL_COMMUNITY): Payer: Self-pay

## 2023-12-06 ENCOUNTER — Other Ambulatory Visit: Payer: Self-pay

## 2023-12-16 DIAGNOSIS — G4730 Sleep apnea, unspecified: Secondary | ICD-10-CM | POA: Diagnosis not present

## 2023-12-18 DIAGNOSIS — G4730 Sleep apnea, unspecified: Secondary | ICD-10-CM | POA: Diagnosis not present

## 2023-12-26 DIAGNOSIS — Z85820 Personal history of malignant melanoma of skin: Secondary | ICD-10-CM | POA: Diagnosis not present

## 2023-12-26 DIAGNOSIS — L570 Actinic keratosis: Secondary | ICD-10-CM | POA: Diagnosis not present

## 2023-12-26 DIAGNOSIS — L821 Other seborrheic keratosis: Secondary | ICD-10-CM | POA: Diagnosis not present

## 2023-12-31 ENCOUNTER — Telehealth: Payer: Self-pay | Admitting: Pulmonary Disease

## 2023-12-31 NOTE — Telephone Encounter (Signed)
 HST showed mild-mod  OSA with AHI 14/ hr Unfortunately, this does not qualify him for inspire Suggest he continue on CPAP , we can reassess in the future

## 2024-01-03 ENCOUNTER — Other Ambulatory Visit (HOSPITAL_COMMUNITY): Payer: Self-pay

## 2024-01-03 ENCOUNTER — Other Ambulatory Visit: Payer: Self-pay

## 2024-01-10 NOTE — Telephone Encounter (Signed)
 Please let patient know.

## 2024-01-11 NOTE — Telephone Encounter (Signed)
 Pt.notified

## 2024-01-24 ENCOUNTER — Other Ambulatory Visit: Payer: Self-pay | Admitting: Internal Medicine

## 2024-01-24 ENCOUNTER — Other Ambulatory Visit (HOSPITAL_COMMUNITY): Payer: Self-pay

## 2024-01-25 ENCOUNTER — Other Ambulatory Visit (HOSPITAL_COMMUNITY): Payer: Self-pay

## 2024-01-25 MED ORDER — INSUPEN PEN NEEDLES 31G X 8 MM MISC
3 refills | Status: AC
  Filled 2024-01-25: qty 100, 100d supply, fill #0

## 2024-01-26 ENCOUNTER — Other Ambulatory Visit (HOSPITAL_COMMUNITY): Payer: Self-pay

## 2024-01-26 DIAGNOSIS — E7800 Pure hypercholesterolemia, unspecified: Secondary | ICD-10-CM | POA: Diagnosis not present

## 2024-01-26 DIAGNOSIS — E038 Other specified hypothyroidism: Secondary | ICD-10-CM | POA: Diagnosis not present

## 2024-01-27 ENCOUNTER — Other Ambulatory Visit (HOSPITAL_COMMUNITY): Payer: Self-pay

## 2024-01-27 ENCOUNTER — Telehealth (HOSPITAL_BASED_OUTPATIENT_CLINIC_OR_DEPARTMENT_OTHER): Payer: Self-pay

## 2024-01-27 NOTE — Telephone Encounter (Signed)
 CMN received for CPAP settings signed by provider and faxed confirmation received

## 2024-01-30 ENCOUNTER — Other Ambulatory Visit (HOSPITAL_COMMUNITY): Payer: Self-pay

## 2024-02-02 ENCOUNTER — Other Ambulatory Visit (HOSPITAL_COMMUNITY): Payer: Self-pay

## 2024-02-02 DIAGNOSIS — E7800 Pure hypercholesterolemia, unspecified: Secondary | ICD-10-CM | POA: Diagnosis not present

## 2024-02-02 DIAGNOSIS — R609 Edema, unspecified: Secondary | ICD-10-CM | POA: Diagnosis not present

## 2024-02-02 DIAGNOSIS — E1165 Type 2 diabetes mellitus with hyperglycemia: Secondary | ICD-10-CM | POA: Diagnosis not present

## 2024-02-02 DIAGNOSIS — I10 Essential (primary) hypertension: Secondary | ICD-10-CM | POA: Diagnosis not present

## 2024-02-02 MED ORDER — MOUNJARO 5 MG/0.5ML ~~LOC~~ SOAJ
5.0000 mg | SUBCUTANEOUS | 5 refills | Status: AC
  Filled 2024-02-02 – 2024-02-05 (×3): qty 2, 28d supply, fill #0
  Filled 2024-03-05: qty 2, 28d supply, fill #1
  Filled 2024-03-28: qty 2, 28d supply, fill #2
  Filled 2024-04-16 – 2024-04-23 (×3): qty 2, 28d supply, fill #3
  Filled 2024-05-29: qty 2, 28d supply, fill #4

## 2024-02-02 MED ORDER — INSULIN PEN NEEDLE 32G X 4 MM MISC
3 refills | Status: AC
  Filled 2024-02-02: qty 100, 100d supply, fill #0
  Filled 2024-02-02: qty 100, 90d supply, fill #0
  Filled 2024-04-16 – 2024-05-29 (×2): qty 100, 100d supply, fill #1

## 2024-02-05 ENCOUNTER — Other Ambulatory Visit (HOSPITAL_COMMUNITY): Payer: Self-pay

## 2024-02-20 ENCOUNTER — Other Ambulatory Visit (HOSPITAL_COMMUNITY): Payer: Self-pay

## 2024-02-24 ENCOUNTER — Other Ambulatory Visit: Payer: Self-pay

## 2024-03-07 ENCOUNTER — Other Ambulatory Visit (HOSPITAL_COMMUNITY): Payer: Self-pay

## 2024-03-16 ENCOUNTER — Ambulatory Visit: Admitting: Podiatry

## 2024-03-19 ENCOUNTER — Ambulatory Visit: Admitting: Podiatry

## 2024-03-19 ENCOUNTER — Encounter: Payer: Self-pay | Admitting: Podiatry

## 2024-03-19 VITALS — Ht <= 58 in | Wt 234.0 lb

## 2024-03-19 DIAGNOSIS — M79675 Pain in left toe(s): Secondary | ICD-10-CM

## 2024-03-19 DIAGNOSIS — M722 Plantar fascial fibromatosis: Secondary | ICD-10-CM

## 2024-03-19 DIAGNOSIS — E1140 Type 2 diabetes mellitus with diabetic neuropathy, unspecified: Secondary | ICD-10-CM

## 2024-03-19 DIAGNOSIS — M79674 Pain in right toe(s): Secondary | ICD-10-CM | POA: Diagnosis not present

## 2024-03-19 DIAGNOSIS — B351 Tinea unguium: Secondary | ICD-10-CM | POA: Diagnosis not present

## 2024-03-19 DIAGNOSIS — E1149 Type 2 diabetes mellitus with other diabetic neurological complication: Secondary | ICD-10-CM

## 2024-03-21 NOTE — Progress Notes (Signed)
 Subjective:   Patient ID: [name] [name], male   DOB: 73 y.o.   MRN: [id number]   HPI Patient presents with long-term history diabetes elongated nailbeds that are hard for him to cut and pain in the right plantar heel that is been present for a shorter period of time.  Patient does not smoke diabetes in reasonably good condition A1c in the 7.5 range   Review of Systems  All other systems reviewed and are negative.       Objective:  Physical Exam Vitals and nursing note reviewed.  Constitutional:      Appearance: He is well-developed.  Pulmonary:     Effort: Pulmonary effort is normal.  Musculoskeletal:        General: Normal range of motion.  Skin:    General: Skin is warm.  Neurological:     Mental Status: He is alert.     Neurovascular status was found to be intact muscle strength was found to be adequate with range of motion reduced subtalar midtarsal joint.  I did note a diminishment of sharp dull vibratory bilateral and upon question I do think moderate diabetic neuropathic condition exist.  Patient has elongated nailbeds 1-5 both feet that are thick dystrophic and become painful when pressed.  Good digital perfusion well-oriented x 3     Assessment:  Patient with long-term diabetes under reasonably good control but can do better with moderate diabetic neuropathy and mycotic nail infection 1-5 both feet with pain     Plan:  H&P reviewed all conditions and educated him on diabetic footcare and inspections.  Debrided nailbeds 1-5 both feet no iatrogenic bleeding and reappoint for routine care

## 2024-03-26 DIAGNOSIS — L570 Actinic keratosis: Secondary | ICD-10-CM | POA: Diagnosis not present

## 2024-03-26 DIAGNOSIS — I872 Venous insufficiency (chronic) (peripheral): Secondary | ICD-10-CM | POA: Diagnosis not present

## 2024-03-26 DIAGNOSIS — L820 Inflamed seborrheic keratosis: Secondary | ICD-10-CM | POA: Diagnosis not present

## 2024-04-09 ENCOUNTER — Telehealth: Payer: Self-pay

## 2024-04-09 NOTE — Telephone Encounter (Signed)
 Orthotics are in 04/09/2024

## 2024-04-16 ENCOUNTER — Other Ambulatory Visit: Payer: Self-pay

## 2024-04-16 ENCOUNTER — Other Ambulatory Visit (HOSPITAL_COMMUNITY): Payer: Self-pay

## 2024-04-19 ENCOUNTER — Other Ambulatory Visit (HOSPITAL_COMMUNITY): Payer: Self-pay

## 2024-04-23 ENCOUNTER — Ambulatory Visit

## 2024-04-23 VITALS — BP 130/78 | HR 78 | Ht 65.5 in | Wt 220.0 lb

## 2024-04-23 DIAGNOSIS — Z0000 Encounter for general adult medical examination without abnormal findings: Secondary | ICD-10-CM

## 2024-04-23 DIAGNOSIS — L570 Actinic keratosis: Secondary | ICD-10-CM | POA: Diagnosis not present

## 2024-04-23 DIAGNOSIS — L872 Elastosis perforans serpiginosa: Secondary | ICD-10-CM | POA: Diagnosis not present

## 2024-04-23 DIAGNOSIS — C44329 Squamous cell carcinoma of skin of other parts of face: Secondary | ICD-10-CM | POA: Diagnosis not present

## 2024-04-23 NOTE — Patient Instructions (Addendum)
 [name],  Thank you for taking the time for your Medicare Wellness Visit. I appreciate your continued commitment to your health goals. Please review the care plan we discussed, and feel free to reach out if I can assist you further.  Please note that Annual Wellness Visits do not include a physical exam. Some assessments may be limited, especially if the visit was conducted virtually. If needed, we may recommend an in-person follow-up with your provider.  Ongoing Care Seeing your primary care provider every 3 to 6 months helps us  monitor your health and provide consistent, personalized care.   Referrals If a referral was made during today's visit and you haven't received any updates within two weeks, please contact the referred provider directly to check on the status.  Recommended Screenings:  Health Maintenance  Topic Date Due   Yearly kidney health urinalysis for diabetes  04/09/2009   Complete foot exam   02/28/2018   DTaP/Tdap/Td vaccine (2 - Tdap) 04/11/2018   Eye exam for diabetics  01/27/2023   Colon Cancer Screening  04/11/2023   Hemoglobin A1C  11/11/2023   COVID-19 Vaccine (4 - 2025-26 season) 01/09/2024   Yearly kidney function blood test for diabetes  05/13/2024   Medicare Annual Wellness Visit  04/23/2025   Pneumococcal Vaccine for age over 33  Completed   Flu Shot  Completed   Hepatitis C Screening  Completed   Zoster (Shingles) Vaccine  Completed   Meningitis B Vaccine  Aged Out       04/23/2024   12:41 PM  Advanced Directives  Does Patient Have a Medical Advance Directive? Yes  Type of Estate Agent of [place];Living will  Does patient want to make changes to medical advance directive? Yes (Inpatient - patient requests chaplain consult to change a medical advance directive)  Copy of Healthcare Power of Attorney in Chart? No - copy requested    Vision: Annual vision screenings are recommended for early detection of glaucoma,  cataracts, and diabetic retinopathy. These exams can also reveal signs of chronic conditions such as diabetes and high blood pressure.  Dental: Annual dental screenings help detect early signs of oral cancer, gum disease, and other conditions linked to overall health, including heart disease and diabetes.

## 2024-04-23 NOTE — Progress Notes (Addendum)
 Chief Complaint  Patient presents with   Medicare Wellness     Subjective:  Please attest and cosign this visit due to patients primary care provider not being in the office at the time the visit was completed.  (Pt of Dr A. Plotnikov)   [name] [name] is a 73 y.o. male who presents for a Medicare Annual Wellness Visit.  Visit info / Clinical Intake: Medicare Wellness Visit Type:: Subsequent Annual Wellness Visit Persons participating in visit and providing information:: patient Medicare Wellness Visit Mode:: In-person (required for WTM) Interpreter Needed?: No Pre-visit prep was completed: yes AWV questionnaire completed by patient prior to visit?: yes Date:: 04/19/24 Living arrangements:: lives with spouse/significant other Patient's Overall Health Status Rating: good Typical amount of pain: some Does pain affect daily life?: no Are you currently prescribed opioids?: no  Dietary Habits and Nutritional Risks How many meals a day?: 2 Eats fruit and vegetables daily?: yes Most meals are obtained by: preparing own meals In the last 2 weeks, have you had any of the following?: none Diabetic:: (!) yes Any non-healing wounds?: no How often do you check your BS?: 1; as needed Would you like to be referred to a Nutritionist or for Diabetic Management? : no  Functional Status Activities of Daily Living (to include ambulation/medication): Independent Ambulation: Independent with device- listed below Home Assistive Devices/Equipment: Eyeglasses; CPAP Medication Administration: Independent Home Management (perform basic housework or laundry): Independent Manage your own finances?: yes Primary transportation is: driving Concerns about vision?: no *vision screening is required for WTM* Concerns about hearing?: (!) yes Uses hearing aids?: (!) yes Hear whispered voice?: yes  Fall Screening Falls in the past year?: 0 Number of falls in past year: 0 Was there an injury with  Fall?: 0 Fall Risk Category Calculator: 0 Patient Fall Risk Level: Low Fall Risk  Fall Risk Patient at Risk for Falls Due to: No Fall Risks Fall risk Follow up: Falls evaluation completed; Falls prevention discussed  Home and Transportation Safety: All rugs have non-skid backing?: yes All stairs or steps have railings?: yes Grab bars in the bathtub or shower?: (!) no Have non-skid surface in bathtub or shower?: yes Good home lighting?: yes Regular seat belt use?: yes Hospital stays in the last year:: no  Cognitive Assessment Difficulty concentrating, remembering, or making decisions? : no Will 6CIT or Mini Cog be Completed: yes What year is it?: 0 points What month is it?: 0 points Give patient an address phrase to remember (5 components): [address] [place], [place] About what time is it?: 0 points Count backwards from 20 to 1: 0 points Say the months of the year in reverse: 0 points Repeat the address phrase from earlier: 0 points 6 CIT Score: 0 points  Advance Directives (For Healthcare) Does Patient Have a Medical Advance Directive?: Yes Does patient want to make changes to medical advance directive?: Yes (Inpatient - patient requests chaplain consult to change a medical advance directive) Type of Advance Directive: Healthcare Power of [place]; Living will Copy of Healthcare Power of Attorney in Chart?: No - copy requested Copy of Living Will in Chart?: No - copy requested  Reviewed/Updated  Reviewed/Updated: Reviewed All (Medical, Surgical, Family, Medications, Allergies, Care Teams, Patient Goals)    Allergies (verified) Amoxicillin, Dilaudid  [hydromorphone ], Lisinopril , Simvastatin, and Penicillins   Current Medications (verified) Outpatient Encounter Medications as of 04/23/2024  Medication Sig   albuterol  (VENTOLIN  HFA) 108 (90 Base) MCG/ACT inhaler Inhale 2 puffs into the lungs every 6 (six)  hours as needed for wheezing or shortness of breath.   amLODipine   (NORVASC ) 5 MG tablet Take 1 tablet (5 mg total) by mouth daily.   chlorpheniramine-HYDROcodone  (TUSSIONEX) 10-8 MG/5ML Take 5 mLs by mouth every 12 (twelve) hours as needed for cough.   Cholecalciferol (VITAMIN D ) 50 MCG (2000 UT) tablet Take 2,000 Units by mouth daily.   clobetasol  (TEMOVATE ) 0.05 % external solution Apply 1 application. topically 2 (two) times daily.   cloNIDine  (CATAPRES ) 0.1 MG tablet TAKE 1 TABLET BY MOUTH THREE TIMES DAILY AS NEEDED. TAKE IF SYSTOLIC BLOOD PRESSURE>170 (Patient taking differently: Take 0.1 mg by mouth as needed (take if systolic BP is > 829).)   Continuous Glucose Sensor (FREESTYLE LIBRE 3 PLUS SENSOR) MISC Use to monitor blood sugar levels continuously, changing every 15 days.   empagliflozin  (JARDIANCE ) 10 MG TABS tablet Take 1 tablet (10 mg total) by mouth daily.   gabapentin  (NEURONTIN ) 600 MG tablet Take 1 tablet (600 mg total) by mouth 2 (two) times daily.   glimepiride  (AMARYL ) 2 MG tablet Take 1 tablet (2 mg total) by mouth 2 (two) times daily.   insulin  glargine, 2 Unit Dial , (TOUJEO  MAX SOLOSTAR) 300 UNIT/ML Solostar Pen Inject 40 Units into the skin in the morning.   Insulin  Pen Needle (INSUPEN PEN NEEDLES) 31G X 8 MM MISC Use to inject insulin  once a day.   Insulin  Pen Needle 32G X 4 MM MISC Use daily as directed   losartan  (COZAAR ) 100 MG tablet Take 1 tablet (100 mg total) by mouth daily.   metoprolol  succinate (TOPROL -XL) 25 MG 24 hr tablet Take 1 tablet (25 mg total) by mouth daily.   ondansetron  (ZOFRAN ) 4 MG tablet Take 1 tablet (4 mg total) by mouth every 8 (eight) hours as needed for nausea or vomiting. (Patient taking differently: Take 4 mg by mouth as needed for nausea or vomiting.)   rosuvastatin  (CRESTOR ) 40 MG tablet Take 40 mg by mouth at bedtime.   tamsulosin  (FLOMAX ) 0.4 MG CAPS capsule Take 1 capsule (0.4 mg total) by mouth daily after supper.   tirzepatide  (MOUNJARO ) 5 MG/0.5ML Pen Inject 5 mg into the skin once a week.    tirzepatide  (MOUNJARO ) 7.5 MG/0.5ML Pen Inject 7.5 mg into the skin once a week.   No facility-administered encounter medications on file as of 04/23/2024.    History: Past Medical History:  Diagnosis Date   Arthritis    knees   Bursitis of left shoulder    Cancer (HCC)    skin   Cervical disc disease    Chronic kidney disease    stage 3   Diabetes mellitus, type 2 (HCC)    Diverticulosis of colon    Erectile dysfunction    Exogenous obesity    History of anemia    History of kidney stones    Hyperlipidemia    Hypertension    Internal hemorrhoids    Internal hemorrhoids with complication - fecal smearing 09/11/2018   Neuromuscular disorder (HCC)    neuropathy in feet   OSA (obstructive sleep apnea)    cpap   Testicular hypofunction    Past Surgical History:  Procedure Laterality Date   ANTERIOR CERVICAL DECOMP/DISCECTOMY FUSION  2008   some limit to ROM   COLONOSCOPY     POSTERIOR LAMINECTOMY / DECOMPRESSION CERVICAL SPINE  2009   c5-6 fusion  Dr. Armen. with nerve damage to fingers Rt arm   UMBILICAL HERNIA REPAIR N/A 03/13/2021   Procedure: LAPAROSCOPIC UMBILICAL HERNIA  REPAIR;  Surgeon: Gladis Cough, MD;  Location: [facility];  Service: General;  Laterality: N/A;   Family History  Problem Relation Age of Onset   Heart disease Father        Died of MI at age 29   Breast cancer Mother    Diabetes Paternal Grandmother    Healthy Son    Colon cancer Neg Hx    Social History   Occupational History   Occupation: information systems manager for [employer]   Occupation: SUPERVISOR    Employer: [employer]   Occupation: retired 2018  Tobacco Use   Smoking status: Former    Current packs/day: 0.00    Types: Cigarettes    Quit date: 01/20/1981    Years since quitting: 43.2   Smokeless tobacco: Never  Vaping Use   Vaping status: Never Used  Substance and Sexual Activity   Alcohol use: Yes    Alcohol/week: 7.0 standard drinks of alcohol    Types: 4 Glasses of  wine, 3 Standard drinks or equivalent per week    Comment: He drinks 3-4 glasses of wine several times per week.   Drug use: No   Sexual activity: Yes   Tobacco Counseling Counseling given: Not Answered  SDOH Screenings   Food Insecurity: No Food Insecurity (04/23/2024)  Housing: Unknown (04/23/2024)  Transportation Needs: No Transportation Needs (04/23/2024)  Utilities: Not At Risk (04/23/2024)  Alcohol Screen: Low Risk (04/19/2024)  Depression (PHQ2-9): Low Risk (04/23/2024)  Financial Resource Strain: Low Risk (04/19/2024)  Physical Activity: Insufficiently Active (04/23/2024)  Social Connections: Moderately Isolated (04/23/2024)  Stress: No Stress Concern Present (04/23/2024)  Tobacco Use: Medium Risk (04/23/2024)  Health Literacy: Adequate Health Literacy (04/23/2024)   See flowsheets for full screening details  Depression Screen PHQ 2 & 9 Depression Scale- Over the past 2 weeks, how often have you been bothered by any of the following problems? Little interest or pleasure in doing things: 0 Feeling down, depressed, or hopeless (PHQ Adolescent also includes...irritable): 0 PHQ-2 Total Score: 0 Trouble falling or staying asleep, or sleeping too much: 0 Feeling tired or having little energy: 0 Poor appetite or overeating (PHQ Adolescent also includes...weight loss): 0 Feeling bad about yourself - or that you are a failure or have let yourself or your family down: 0 Trouble concentrating on things, such as reading the newspaper or watching television (PHQ Adolescent also includes...like school work): 0 Moving or speaking so slowly that other people could have noticed. Or the opposite - being so fidgety or restless that you have been moving around a lot more than usual: 0 Thoughts that you would be better off dead, or of hurting yourself in some way: 0 PHQ-9 Total Score: 0 If you checked off any problems, how difficult have these problems made it for you to do your work, take  care of things at home, or get along with other people?: Not difficult at all  Depression Treatment Depression Interventions/Treatment : EYV7-0 Score <4 Follow-up Not Indicated     Goals Addressed               This Visit's Progress     Patient Stated (pt-stated)        Patient stated he plans to continue watching diet and taking meds daily             Objective:    Today's Vitals   04/23/24 1232  BP: 130/78  Pulse: 78  SpO2: 98%  Weight: 220 lb (99.8 kg)  Height: 5' 5.5 (1.664 m)   Body mass index is 36.05 kg/m.  Hearing/Vision screen Hearing Screening - Comments:: Right hearing concern - declines Audiology consult Vision Screening - Comments:: Wears rx glasses - up to date with routine eye exams with [facility]  Immunizations and Health Maintenance Health Maintenance  Topic Date Due   Diabetic kidney evaluation - Urine ACR  04/09/2009   FOOT EXAM  02/28/2018   DTaP/Tdap/Td (2 - Tdap) 04/11/2018   OPHTHALMOLOGY EXAM  01/27/2023   Colonoscopy  04/11/2023   HEMOGLOBIN A1C  11/11/2023   COVID-19 Vaccine (4 - 2025-26 season) 01/09/2024   Diabetic kidney evaluation - eGFR measurement  05/13/2024   Medicare Annual Wellness (AWV)  04/23/2025   Pneumococcal Vaccine: 50+ Years  Completed   Influenza Vaccine  Completed   Hepatitis C Screening  Completed   Zoster Vaccines- Shingrix  Completed   Meningococcal B Vaccine  Aged Out        Assessment/Plan:  This is a routine wellness examination for [employer].  Patient Care Team: Plotnikov, Karlynn GAILS, MD as PCP - General (Internal Medicine) Ottelin, Mark, MD (Inactive) as Consulting Physician (Urology) Tommas Pears, MD as Referring Physician (Endocrinology) Rayburn Pac, MD as Consulting Physician (Nephrology) [facility] as Physician Assistant (Optometry)  I have personally reviewed and noted the following in the patients chart:   Medical and social history Use of alcohol, tobacco or illicit  drugs  Current medications and supplements including opioid prescriptions. Functional ability and status Nutritional status Physical activity Advanced directives List of other physicians Hospitalizations, surgeries, and ER visits in previous 12 months Vitals Screenings to include cognitive, depression, and falls Referrals and appointments  No orders of the defined types were placed in this encounter.  In addition, I have reviewed and discussed with patient certain preventive protocols, quality metrics, and best practice recommendations. A written personalized care plan for preventive services as well as general preventive health recommendations were provided to patient.   Verdie CHRISTELLA Saba, CMA   04/23/2024   Return in 1 year (on 04/23/2025).  After Visit Summary: (In Person-Declined) Patient declined AVS at this time.  Nurse Notes: scheduled 2026 AWV/CPE appts

## 2024-05-07 NOTE — Telephone Encounter (Signed)
 12/29 orthotics are in the office.  Appt for pick-up 05/18/24

## 2024-05-18 ENCOUNTER — Ambulatory Visit

## 2024-05-18 DIAGNOSIS — M722 Plantar fascial fibromatosis: Secondary | ICD-10-CM

## 2024-05-18 DIAGNOSIS — E1140 Type 2 diabetes mellitus with diabetic neuropathy, unspecified: Secondary | ICD-10-CM

## 2024-05-18 DIAGNOSIS — E1149 Type 2 diabetes mellitus with other diabetic neurological complication: Secondary | ICD-10-CM

## 2024-05-18 NOTE — Progress Notes (Unsigned)

## 2024-05-29 ENCOUNTER — Other Ambulatory Visit: Payer: Self-pay

## 2024-05-29 ENCOUNTER — Other Ambulatory Visit (HOSPITAL_COMMUNITY): Payer: Self-pay

## 2024-05-29 ENCOUNTER — Other Ambulatory Visit: Payer: Self-pay | Admitting: Internal Medicine

## 2024-05-29 MED ORDER — HYDROCOD POLI-CHLORPHE POLI ER 10-8 MG/5ML PO SUER
5.0000 mL | Freq: Two times a day (BID) | ORAL | 0 refills | Status: AC | PRN
  Filled 2024-05-29: qty 240, 24d supply, fill #0

## 2024-05-30 ENCOUNTER — Encounter: Payer: Self-pay | Admitting: *Deleted

## 2024-05-30 ENCOUNTER — Other Ambulatory Visit (HOSPITAL_COMMUNITY): Payer: Self-pay

## 2024-05-30 NOTE — Progress Notes (Signed)
 [name] [name]                                          MRN: [id number]   05/30/2024   The [facility] Quality Team Specialist reviewed this patient medical record for the purposes of chart review for care gap closure. The following were reviewed: chart review for care gap closure-kidney health evaluation for diabetes:eGFR  and uACR.    [facility] Quality Team

## 2024-06-05 ENCOUNTER — Ambulatory Visit: Admitting: Internal Medicine

## 2024-06-05 ENCOUNTER — Encounter: Payer: Self-pay | Admitting: Internal Medicine

## 2024-06-05 VITALS — BP 120/84 | HR 71 | Temp 98.6°F | Ht 65.0 in | Wt 216.0 lb

## 2024-06-05 DIAGNOSIS — R059 Cough, unspecified: Secondary | ICD-10-CM | POA: Diagnosis not present

## 2024-06-05 DIAGNOSIS — E1165 Type 2 diabetes mellitus with hyperglycemia: Secondary | ICD-10-CM | POA: Diagnosis not present

## 2024-06-05 DIAGNOSIS — G4733 Obstructive sleep apnea (adult) (pediatric): Secondary | ICD-10-CM | POA: Diagnosis not present

## 2024-06-05 DIAGNOSIS — Z794 Long term (current) use of insulin: Secondary | ICD-10-CM

## 2024-06-05 DIAGNOSIS — Z6835 Body mass index (BMI) 35.0-35.9, adult: Secondary | ICD-10-CM

## 2024-06-05 NOTE — Patient Instructions (Signed)
 HST showed mild-mod  OSA with AHI 14/ hr Unfortunately, this does not qualify him for inspire Suggest he continue on CPAP , we can reassess in the future

## 2024-06-05 NOTE — Progress Notes (Signed)
 "  Subjective:  Patient ID: [name] [name], male    DOB: [birth year]  Age: 74 y.o. MRN: [id number]  CC: Follow-up   HPI [name] [name] presents for DM, HTN, obesity, OSA [name] [name] lost 30+ lbs On Mounjaro  5 mg q 10 d sq  Per HST (Dr Jude): HST showed mild-mod  OSA with AHI 14/ hr Unfortunately, this does not qualify him for inspire Suggest he continue on CPAP , we can reassess in the future     Outpatient Medications Prior to Visit  Medication Sig Dispense Refill   albuterol  (VENTOLIN  HFA) 108 (90 Base) MCG/ACT inhaler Inhale 2 puffs into the lungs every 6 (six) hours as needed for wheezing or shortness of breath. 18 g 3   amLODipine  (NORVASC ) 5 MG tablet Take 1 tablet (5 mg total) by mouth daily. 90 tablet 3   chlorpheniramine-HYDROcodone  (TUSSIONEX) 10-8 MG/5ML Take 5 mLs by mouth every 12 (twelve) hours as needed for cough. 240 mL 0   Cholecalciferol (VITAMIN D ) 50 MCG (2000 UT) tablet Take 2,000 Units by mouth daily.     clobetasol  (TEMOVATE ) 0.05 % external solution Apply 1 application. topically 2 (two) times daily. 50 mL 2   cloNIDine  (CATAPRES ) 0.1 MG tablet TAKE 1 TABLET BY MOUTH THREE TIMES DAILY AS NEEDED. TAKE IF SYSTOLIC BLOOD PRESSURE>170 (Patient taking differently: Take 0.1 mg by mouth as needed (take if systolic BP is > 829).) 90 tablet 2   Continuous Glucose Sensor (FREESTYLE LIBRE 3 PLUS SENSOR) MISC Use to monitor blood sugar levels continuously, changing every 15 days. 6 each 5   empagliflozin  (JARDIANCE ) 10 MG TABS tablet Take 1 tablet (10 mg total) by mouth daily. 90 tablet 3   gabapentin  (NEURONTIN ) 600 MG tablet Take 1 tablet (600 mg total) by mouth 2 (two) times daily. 180 tablet 3   glimepiride  (AMARYL ) 2 MG tablet Take 1 tablet (2 mg total) by mouth 2 (two) times daily. 180 tablet 5   insulin  glargine, 2 Unit Dial , (TOUJEO  MAX SOLOSTAR) 300 UNIT/ML Solostar Pen Inject 40 Units into the skin in the morning. 45 mL 5   Insulin  Pen Needle (INSUPEN PEN NEEDLES) 31G X 8  MM MISC Use to inject insulin  once a day. 200 each 3   Insulin  Pen Needle 32G X 4 MM MISC Use daily as directed 100 each 3   losartan  (COZAAR ) 100 MG tablet Take 1 tablet (100 mg total) by mouth daily. 90 tablet 3   metoprolol  succinate (TOPROL -XL) 25 MG 24 hr tablet Take 1 tablet (25 mg total) by mouth daily. 180 tablet 3   ondansetron  (ZOFRAN ) 4 MG tablet Take 1 tablet (4 mg total) by mouth every 8 (eight) hours as needed for nausea or vomiting. (Patient taking differently: Take 4 mg by mouth as needed for nausea or vomiting.) 20 tablet 0   rosuvastatin  (CRESTOR ) 40 MG tablet Take 40 mg by mouth at bedtime.     tamsulosin  (FLOMAX ) 0.4 MG CAPS capsule Take 1 capsule (0.4 mg total) by mouth daily after supper. 90 capsule 3   tirzepatide  (MOUNJARO ) 5 MG/0.5ML Pen Inject 5 mg into the skin once a week. 2 mL 5   tirzepatide  (MOUNJARO ) 7.5 MG/0.5ML Pen Inject 7.5 mg into the skin once a week. 2 mL 5   No facility-administered medications prior to visit.    ROS: Review of Systems  Constitutional:  Negative for appetite change, fatigue and unexpected weight change.  HENT:  Negative for congestion, nosebleeds, sneezing, sore throat and trouble swallowing.  Eyes:  Negative for itching and visual disturbance.  Respiratory:  Positive for cough.   Cardiovascular:  Negative for chest pain, palpitations and leg swelling.  Gastrointestinal:  Negative for abdominal distention, blood in stool, diarrhea and nausea.  Genitourinary:  Negative for frequency and hematuria.  Musculoskeletal:  Negative for back pain, gait problem, joint swelling and neck pain.  Skin:  Negative for rash.  Neurological:  Negative for dizziness, tremors, speech difficulty and weakness.  Psychiatric/Behavioral:  Positive for sleep disturbance. Negative for agitation, decreased concentration and dysphoric mood. The patient is not nervous/anxious.     Objective:  BP 120/84   Pulse 71   Temp 98.6 F (37 C) (Oral)   Ht 5' 5  (1.651 m)   Wt 216 lb (98 kg)   SpO2 98%   BMI 35.94 kg/m   BP Readings from Last 3 Encounters:  06/05/24 120/84  04/23/24 130/78  12/01/23 (!) 144/68    Wt Readings from Last 3 Encounters:  06/05/24 216 lb (98 kg)  04/23/24 220 lb (99.8 kg)  03/19/24 234 lb (106.1 kg)    Physical Exam Constitutional:      General: He is not in acute distress.    Appearance: He is well-developed. He is obese.     Comments: NAD  Eyes:     Conjunctiva/sclera: Conjunctivae normal.     Pupils: Pupils are equal, round, and reactive to light.  Neck:     Thyroid : No thyromegaly.     Vascular: No JVD.  Cardiovascular:     Rate and Rhythm: Normal rate and regular rhythm.     Heart sounds: Normal heart sounds. No murmur heard.    No friction rub. No gallop.  Pulmonary:     Effort: Pulmonary effort is normal. No respiratory distress.     Breath sounds: Normal breath sounds. No wheezing or rales.  Chest:     Chest wall: No tenderness.  Abdominal:     General: Bowel sounds are normal. There is no distension.     Palpations: Abdomen is soft. There is no mass.     Tenderness: There is no abdominal tenderness. There is no guarding or rebound.  Musculoskeletal:        General: No tenderness. Normal range of motion.     Cervical back: Normal range of motion.  Lymphadenopathy:     Cervical: No cervical adenopathy.  Skin:    General: Skin is warm and dry.     Findings: No rash.  Neurological:     Mental Status: He is alert and oriented to person, place, and time.     Cranial Nerves: No cranial nerve deficit.     Motor: No abnormal muscle tone.     Coordination: Coordination normal.     Gait: Gait normal.     Deep Tendon Reflexes: Reflexes are normal and symmetric.  Psychiatric:        Behavior: Behavior normal.        Thought Content: Thought content normal.        Judgment: Judgment normal.     Lab Results  Component Value Date   WBC 8.8 05/14/2023   HGB 12.4 (L) 05/14/2023   HCT 38.2  (L) 05/14/2023   PLT 200 05/14/2023   GLUCOSE 215 (H) 05/14/2023   CHOL 165 08/19/2020   TRIG 111.0 08/19/2020   HDL 44.60 08/19/2020   LDLDIRECT 200.0 02/17/2016   LDLCALC 98 08/19/2020   ALT 21 05/13/2023   AST 27 05/13/2023  NA 140 05/14/2023   K 4.3 05/14/2023   CL 105 05/14/2023   CREATININE 2.24 (H) 05/14/2023   BUN 37 (H) 05/14/2023   CO2 28 05/14/2023   TSH 3.46 08/19/2020   PSA 0.73 02/04/2021   INR 1.08 02/02/2011   HGBA1C 6.9 (H) 05/14/2023   MICROALBUR 23.0 (H) 04/09/2008    DG Chest 2 View Result Date: 05/13/2023 CLINICAL DATA:  Cough.  Shortness of breath. EXAM: CHEST - 2 VIEW COMPARISON:  12/28/2021. FINDINGS: Low lung volume. There are probable atelectatic changes at the lung bases. Bilateral lung fields are otherwise clear. No acute consolidation or lung collapse. Bilateral costophrenic angles are clear. Normal cardio-mediastinal silhouette. No acute osseous abnormalities. The soft tissues are within normal limits. IMPRESSION: No active cardiopulmonary disease. Electronically Signed   By: Ree Molt M.D.   On: 05/13/2023 11:59    Assessment & Plan:   Problem List Items Addressed This Visit     DM2 (diabetes mellitus, type 2) (HCC) - Primary   LBP, radiculitis - s/p surgery Oxy po if pain returned Jardiance  is too $$$ - Rx for Farxiga  D/c Ozempic  On Mounjaro  now - Dr Tommas Pt had labs w/Dr Tommas      Obesity, morbid (HCC)   [name] [name] lost 30+ lbs On Mounjaro  5 mg q 10 d sq       OSA on CPAP   [name] [name] lost 30+ lbs On Mounjaro  5 mg q 10 d sq  Difficulty using CPAP  Per HST (Dr Jude): HST showed mild-mod  OSA with AHI 14/ hr Unfortunately, this does not qualify him for inspire Suggest he continue on CPAP , we can reassess in the future         Cough in adult   Recurrent/ chronic, primarily at nighttime.  Could be CPAP use related.  Negative workup. Seeing Dr Alva Tussionex prn - rare use  Potential benefits of opioids use as well as potential  risks (i.e. addiction risk, apnea etc) and complications (i.e. Somnolence, constipation and others) were explained to the patient and were aknowledged. Pulmonary consult was offered         No orders of the defined types were placed in this encounter.     Follow-up: Return in about 6 months (around 12/03/2024) for a follow-up visit.  Marolyn Noel, MD "

## 2024-06-05 NOTE — Assessment & Plan Note (Signed)
 LBP, radiculitis - s/p surgery Oxy po if pain returned Jardiance is too $$$ - Rx for [name] D/c Ozempic On Mounjaro now - Dr Talmage Nap Pt had labs w/Dr Talmage Nap

## 2024-06-05 NOTE — Assessment & Plan Note (Signed)
 [name] [name] lost 30+ lbs On Mounjaro  5 mg q 10 d sq

## 2024-06-05 NOTE — Assessment & Plan Note (Signed)
 Recurrent/ chronic, primarily at nighttime.  Could be CPAP use related.  Negative workup. Seeing Dr Alva Tussionex prn - rare use  Potential benefits of opioids use as well as potential risks (i.e. addiction risk, apnea etc) and complications (i.e. Somnolence, constipation and others) were explained to the patient and were aknowledged. Pulmonary consult was offered

## 2024-06-05 NOTE — Assessment & Plan Note (Addendum)
 [name] [name] lost 30+ lbs On Mounjaro  5 mg q 10 d sq  Difficulty using CPAP  Per HST (Dr Jude): HST showed mild-mod  OSA with AHI 14/ hr Unfortunately, this does not qualify him for inspire Suggest he continue on CPAP , we can reassess in the future

## 2024-12-04 ENCOUNTER — Ambulatory Visit: Admitting: Internal Medicine

## 2025-04-30 ENCOUNTER — Encounter: Admitting: Internal Medicine

## 2025-04-30 ENCOUNTER — Ambulatory Visit
